# Patient Record
Sex: Female | Born: 1955 | Race: White | Hispanic: No | Marital: Married | Smoking: Former smoker
Health system: Southern US, Community
[De-identification: ages and names within clinical notes are randomized; demographics above are authoritative.]

## PROBLEM LIST (undated history)

## (undated) DIAGNOSIS — G4730 Sleep apnea, unspecified: Secondary | ICD-10-CM

## (undated) DIAGNOSIS — E669 Obesity, unspecified: Secondary | ICD-10-CM

## (undated) DIAGNOSIS — R112 Nausea with vomiting, unspecified: Secondary | ICD-10-CM

## (undated) DIAGNOSIS — G43909 Migraine, unspecified, not intractable, without status migrainosus: Secondary | ICD-10-CM

## (undated) DIAGNOSIS — M79606 Pain in leg, unspecified: Secondary | ICD-10-CM

## (undated) DIAGNOSIS — E119 Type 2 diabetes mellitus without complications: Secondary | ICD-10-CM

## (undated) DIAGNOSIS — I509 Heart failure, unspecified: Secondary | ICD-10-CM

## (undated) DIAGNOSIS — Z9889 Other specified postprocedural states: Secondary | ICD-10-CM

## (undated) DIAGNOSIS — E785 Hyperlipidemia, unspecified: Secondary | ICD-10-CM

## (undated) DIAGNOSIS — Z95810 Presence of automatic (implantable) cardiac defibrillator: Secondary | ICD-10-CM

## (undated) DIAGNOSIS — I10 Essential (primary) hypertension: Secondary | ICD-10-CM

## (undated) HISTORY — PX: NECK SURGERY: SHX720

## (undated) HISTORY — DX: Heart failure, unspecified: I50.9

## (undated) HISTORY — DX: Sleep apnea, unspecified: G47.30

## (undated) HISTORY — PX: PACEMAKER INSERTION: SHX728

## (undated) HISTORY — DX: Migraine, unspecified, not intractable, without status migrainosus: G43.909

## (undated) HISTORY — DX: Essential (primary) hypertension: I10

## (undated) HISTORY — DX: Presence of automatic (implantable) cardiac defibrillator: Z95.810

## (undated) HISTORY — DX: Type 2 diabetes mellitus without complications: E11.9

## (undated) HISTORY — DX: Pain in leg, unspecified: M79.606

## (undated) HISTORY — PX: ABDOMINAL HYSTERECTOMY: SHX81

## (undated) HISTORY — PX: CARDIAC DEFIBRILLATOR PLACEMENT: SHX171

## (undated) HISTORY — PX: KNEE SURGERY: SHX244

## (undated) HISTORY — DX: Hyperlipidemia, unspecified: E78.5

## (undated) HISTORY — DX: Obesity, unspecified: E66.9

---

## 1999-07-28 ENCOUNTER — Encounter (INDEPENDENT_AMBULATORY_CARE_PROVIDER_SITE_OTHER): Payer: Self-pay | Admitting: Specialist

## 1999-07-28 ENCOUNTER — Inpatient Hospital Stay (HOSPITAL_COMMUNITY): Admission: RE | Admit: 1999-07-28 | Discharge: 1999-07-31 | Payer: Self-pay | Admitting: Obstetrics and Gynecology

## 2000-01-04 ENCOUNTER — Encounter (INDEPENDENT_AMBULATORY_CARE_PROVIDER_SITE_OTHER): Payer: Self-pay | Admitting: *Deleted

## 2000-01-04 ENCOUNTER — Ambulatory Visit (HOSPITAL_COMMUNITY): Admission: RE | Admit: 2000-01-04 | Discharge: 2000-01-04 | Payer: Self-pay | Admitting: Gastroenterology

## 2000-04-28 ENCOUNTER — Encounter: Admission: RE | Admit: 2000-04-28 | Discharge: 2000-04-28 | Payer: Self-pay | Admitting: Obstetrics and Gynecology

## 2000-04-28 ENCOUNTER — Encounter: Payer: Self-pay | Admitting: Obstetrics and Gynecology

## 2000-05-09 ENCOUNTER — Ambulatory Visit (HOSPITAL_BASED_OUTPATIENT_CLINIC_OR_DEPARTMENT_OTHER): Admission: RE | Admit: 2000-05-09 | Discharge: 2000-05-09 | Payer: Self-pay | Admitting: Pulmonary Disease

## 2001-01-16 ENCOUNTER — Other Ambulatory Visit: Admission: RE | Admit: 2001-01-16 | Discharge: 2001-01-16 | Payer: Self-pay | Admitting: Obstetrics and Gynecology

## 2003-10-02 ENCOUNTER — Other Ambulatory Visit: Admission: RE | Admit: 2003-10-02 | Discharge: 2003-10-02 | Payer: Self-pay | Admitting: Obstetrics and Gynecology

## 2004-10-02 ENCOUNTER — Ambulatory Visit (HOSPITAL_COMMUNITY): Admission: RE | Admit: 2004-10-02 | Discharge: 2004-10-02 | Payer: Self-pay | Admitting: Cardiology

## 2005-01-18 ENCOUNTER — Ambulatory Visit (HOSPITAL_COMMUNITY): Admission: RE | Admit: 2005-01-18 | Discharge: 2005-01-19 | Payer: Self-pay | Admitting: Neurosurgery

## 2006-02-02 ENCOUNTER — Encounter: Admission: RE | Admit: 2006-02-02 | Discharge: 2006-02-02 | Payer: Self-pay | Admitting: Cardiology

## 2006-07-15 ENCOUNTER — Ambulatory Visit (HOSPITAL_COMMUNITY): Admission: RE | Admit: 2006-07-15 | Discharge: 2006-07-15 | Payer: Self-pay | Admitting: Cardiology

## 2006-07-15 HISTORY — PX: CARDIAC CATHETERIZATION: SHX172

## 2007-06-07 ENCOUNTER — Emergency Department (HOSPITAL_COMMUNITY): Admission: EM | Admit: 2007-06-07 | Discharge: 2007-06-07 | Payer: Self-pay | Admitting: Emergency Medicine

## 2007-11-10 ENCOUNTER — Ambulatory Visit: Payer: Self-pay | Admitting: Internal Medicine

## 2007-12-13 ENCOUNTER — Ambulatory Visit: Payer: Self-pay | Admitting: Internal Medicine

## 2007-12-13 LAB — CONVERTED CEMR LAB
Basophils Absolute: 0.1 10*3/uL (ref 0.0–0.1)
Basophils Relative: 1.9 % (ref 0.0–3.0)
Creatinine, Ser: 1 mg/dL (ref 0.4–1.2)
Eosinophils Absolute: 0.2 10*3/uL (ref 0.0–0.7)
Eosinophils Relative: 2.8 % (ref 0.0–5.0)
HCT: 35.5 % — ABNORMAL LOW (ref 36.0–46.0)
INR: 1.1 — ABNORMAL HIGH (ref 0.8–1.0)
Lymphocytes Relative: 36.9 % (ref 12.0–46.0)
MCHC: 34.6 g/dL (ref 30.0–36.0)
Monocytes Relative: 6 % (ref 3.0–12.0)
Potassium: 3.4 meq/L — ABNORMAL LOW (ref 3.5–5.1)
Prothrombin Time: 13.2 s (ref 10.9–13.3)
RBC: 4.07 M/uL (ref 3.87–5.11)
RDW: 13.4 % (ref 11.5–14.6)
Sodium: 141 meq/L (ref 135–145)
WBC: 6.9 10*3/uL (ref 4.5–10.5)

## 2007-12-22 ENCOUNTER — Ambulatory Visit: Payer: Self-pay | Admitting: Internal Medicine

## 2007-12-22 ENCOUNTER — Inpatient Hospital Stay (HOSPITAL_COMMUNITY): Admission: RE | Admit: 2007-12-22 | Discharge: 2007-12-23 | Payer: Self-pay | Admitting: Internal Medicine

## 2008-01-10 ENCOUNTER — Ambulatory Visit: Payer: Self-pay

## 2008-03-19 ENCOUNTER — Ambulatory Visit: Payer: Self-pay | Admitting: Internal Medicine

## 2008-05-01 ENCOUNTER — Encounter: Payer: Self-pay | Admitting: Internal Medicine

## 2008-05-02 ENCOUNTER — Encounter: Admission: RE | Admit: 2008-05-02 | Discharge: 2008-05-02 | Payer: Self-pay | Admitting: Obstetrics and Gynecology

## 2008-06-17 ENCOUNTER — Ambulatory Visit: Payer: Self-pay | Admitting: Internal Medicine

## 2008-07-26 ENCOUNTER — Encounter: Payer: Self-pay | Admitting: Pulmonary Disease

## 2008-09-16 ENCOUNTER — Ambulatory Visit: Payer: Self-pay | Admitting: Internal Medicine

## 2008-09-19 ENCOUNTER — Encounter: Payer: Self-pay | Admitting: Internal Medicine

## 2008-10-20 ENCOUNTER — Emergency Department (HOSPITAL_COMMUNITY): Admission: EM | Admit: 2008-10-20 | Discharge: 2008-10-20 | Payer: Self-pay | Admitting: Emergency Medicine

## 2008-12-19 ENCOUNTER — Encounter: Admission: RE | Admit: 2008-12-19 | Discharge: 2008-12-19 | Payer: Self-pay | Admitting: Gastroenterology

## 2008-12-31 ENCOUNTER — Ambulatory Visit (HOSPITAL_COMMUNITY): Admission: RE | Admit: 2008-12-31 | Discharge: 2008-12-31 | Payer: Self-pay | Admitting: Gastroenterology

## 2009-01-21 ENCOUNTER — Encounter: Admission: RE | Admit: 2009-01-21 | Discharge: 2009-01-21 | Payer: Self-pay | Admitting: Gastroenterology

## 2009-03-17 DIAGNOSIS — I10 Essential (primary) hypertension: Secondary | ICD-10-CM | POA: Insufficient documentation

## 2009-03-17 DIAGNOSIS — E7800 Pure hypercholesterolemia, unspecified: Secondary | ICD-10-CM | POA: Insufficient documentation

## 2009-04-14 ENCOUNTER — Ambulatory Visit: Payer: Self-pay | Admitting: Internal Medicine

## 2009-05-04 ENCOUNTER — Emergency Department (HOSPITAL_COMMUNITY): Admission: EM | Admit: 2009-05-04 | Discharge: 2009-05-04 | Payer: Self-pay | Admitting: Family Medicine

## 2009-07-14 ENCOUNTER — Ambulatory Visit: Payer: Self-pay | Admitting: Internal Medicine

## 2009-07-18 ENCOUNTER — Encounter: Payer: Self-pay | Admitting: Internal Medicine

## 2009-08-11 ENCOUNTER — Encounter: Admission: RE | Admit: 2009-08-11 | Discharge: 2009-08-11 | Payer: Self-pay | Admitting: Gastroenterology

## 2009-08-28 ENCOUNTER — Ambulatory Visit (HOSPITAL_COMMUNITY): Admission: RE | Admit: 2009-08-28 | Discharge: 2009-08-28 | Payer: Self-pay | Admitting: Internal Medicine

## 2009-10-15 ENCOUNTER — Encounter: Payer: Self-pay | Admitting: Internal Medicine

## 2009-10-16 ENCOUNTER — Ambulatory Visit: Payer: Self-pay | Admitting: Internal Medicine

## 2009-11-14 ENCOUNTER — Encounter: Payer: Self-pay | Admitting: Internal Medicine

## 2010-01-22 ENCOUNTER — Ambulatory Visit: Payer: Self-pay | Admitting: Internal Medicine

## 2010-02-19 ENCOUNTER — Encounter (INDEPENDENT_AMBULATORY_CARE_PROVIDER_SITE_OTHER): Payer: Self-pay | Admitting: *Deleted

## 2010-04-07 NOTE — Cardiovascular Report (Signed)
Summary: Office Visit Remote   Office Visit Remote   Imported By: Roderic Ovens 07/25/2009 15:26:38  _____________________________________________________________________  External Attachment:    Type:   Image     Comment:   External Document

## 2010-04-07 NOTE — Assessment & Plan Note (Signed)
Summary: PACER CHECK/SL   Visit Type:  Pacemaker check Primary Provider:  Marisue Brooklyn  CC:  sob w/exertion...denies any cp or edema.  History of Present Illness: [name] returns today for followup She is a pleasant 55 yo woman with a DCM, CHF, class 2 and is s/p BiV ICD.  She has had no intercurrent ICD therapies.  She admits to some dietary indiscretion with sodium.  No C/P.  The patient has not been exercising.  No other complaints.  Current Medications (verified): 1)  Estrtest 2.5mg  .... 1 Tab At Bedtime 2)  Tofranil 25 Mg Tabs (Imipramine Hcl) .... 2 Tab At Bedtime 3)  Crestor 10 Mg Tabs (Rosuvastatin Calcium) .Marland Kitchen.. 1 Tab At Bedtime 4)  Valturna 300-320 Mg Tabs (Aliskiren-Valsartan) .Marland Kitchen.. 1 Tab Once Daily 5)  Verapamil Hcl Cr 240 Mg Cr-Tabs (Verapamil Hcl) .Marland Kitchen.. 1 Tab Once Daily 6)  Metformin Hcl 1000 Mg Tabs (Metformin Hcl) .Marland Kitchen.. 1 Tab Two Times A Day 7)  Trilipix 135 Mg Cpdr (Choline Fenofibrate) .Marland Kitchen.. 1 Tab Once Daily 8)  Coreg Cr 20 Mg Xr24h-Cap (Carvedilol Phosphate) .Marland Kitchen.. 1 Tab At Bedtime 9)  Potassium Chloride Crys Cr 20 Meq Cr-Tabs (Potassium Chloride Crys Cr) .Marland Kitchen.. 1 Tab At Bedtime 10)  Victoza 18 Mg/76ml Soln (Liraglutide) .... 1.2 Mg/injectable 1 Time Daily 11)  Torsemide 20 Mg Tabs (Torsemide) .Marland Kitchen.. 1 Tab Every Other Day 12)  Protonix 40 Mg Tbec (Pantoprazole Sodium) .... As Needed 13)  Aspirin Ec 325 Mg Tbec (Aspirin) .... Take One Tablet By Mouth Daily 14)  Fish Oil 1200 Mg Caps (Omega-3 Fatty Acids) .Marland Kitchen.. 1 Cap Once Daily 15)  Vitamin E 400 Unit Caps (Vitamin E) .... 3 Caps Once Daily 16)  Black Cohosh 540 Mg Caps (Black Cohosh) .Marland Kitchen.. 1 Tab Once Daily 17)  Vitamin D 2000 Unit Tabs (Cholecalciferol) .Marland Kitchen.. 1 Tab Once Daily  Allergies (verified): No Known Drug Allergies  Past History:  Past Medical History: Last updated: 03/17/2009 Current Problems:  IMPLANTATION OF DEFIBRILLATOR,  ST. JUDE  MODEL 7122 (ICD-V45.02) HYPERCHOLESTEROLEMIA (ICD-272.0) CHF  (ICD-428.0) HYPERTENSION (ICD-401.9) DM (ICD-250.00) SLEEP APNEA (ICD-780.57)    Review of Systems  The patient denies chest pain, syncope, dyspnea on exertion, and peripheral edema.    Vital Signs:  Patient profile:   55 year old female Height:      67 inches Weight:      222 pounds BMI:     34.90 Pulse rate:   93 / minute Pulse rhythm:   regular BP sitting:   153 / 94  (left arm) Cuff size:   large  Vitals Entered By: Danielle Rankin, CMA (April 14, 2009 3:29 PM)  Physical Exam  General:  Obese, well developed, well nourished, in no acute distress.  HEENT: normal Neck: supple. No JVD. Carotids 2+ bilaterally no bruits Cor: RRR no rubs, gallops or murmur Lungs: CTA. Well healed ICD incision. Ab: soft, nontender. nondistended. No HSM. Good bowel sounds Ext: warm. no cyanosis, clubbing or edema Neuro: alert and oriented. Grossly nonfocal. affect pleasant     ICD Specifications Following MD:  Lewayne Bunting, MD     ICD Vendor:  St Jude     ICD Model Number:  [phone]     ICD Serial Number:  [id number] ICD DOI:  12/22/2007     ICD Implanting MD:  Lewayne Bunting, MD  Lead 1:    Location: RA     DOI: 12/22/2007     Model #: [id number]     Serial #:  [id number]     Status: active Lead 2:    Location: RV     DOI: 12/22/2007     Model #: 7122     Serial #: [id number]     Status: active Lead 3:    Location: LV     DOI: 12/22/2007     Model #: 1478     Serial #: [id number]     Status: active  Indications::  NICM, CHF   ICD Follow Up Remote Check?  No Battery Voltage:  3.17 V     Charge Time:  11.1 seconds     ICD Dependent:  No       ICD Device Measurements Atrium:  Amplitude: 3.7 mV, Impedance: 440 ohms, Threshold: 0.5 V at 0.4 msec Right Ventricle:  Amplitude: 11.7 mV, Impedance: 480 ohms, Threshold: 0.75 V at 0.4 msec Left Ventricle:  Impedance: 340 ohms, Threshold: 0.5 V at 0.4 msec  Episodes MS Episodes:  0     Percent Mode Switch:  0     Coumadin:  No Shock:  0     ATP:  0      Nonsustained:  0     Atrial Pacing:  <1%     Ventricular Pacing:  100%  Brady Parameters Mode DDD     Lower Rate Limit:  60     Upper Rate Limit 130 PAV 180     Sensed AV Delay:  130  Tachy Zones VF:  240     VT:  200     VT1:  181     Next Remote Date:  07/14/2009     Next Cardiology Appt Due:  04/08/2010 Tech Comments:  No parameter changes.  Device function normal.  Resting heart rate 91 today.  Merlin transmissions every 3 months. ROV 1 year Dr. Ladona Ridgel. Altha Harm, LPN  April 14, 2009 4:18 PM  MD Comments:  Agree with above.  Impression & Recommendations:  Problem # 1:  IMPLANTATION OF DEFIBRILLATOR,  ST. JUDE  MODEL 7122 (ICD-V45.02) Her device is working normally.  Will recheck in several months.  Problem # 2:  CHF (ICD-428.0) Her symptoms remain class 2.  I have encouraged her to reduce her sodium intake and to start exercising along with continuing her medical therapy. Her updated medication list for this problem includes:    Verapamil Hcl Cr 240 Mg Cr-tabs (Verapamil hcl) .Marland Kitchen... 1 tab once daily    Coreg Cr 20 Mg Xr24h-cap (Carvedilol phosphate) .Marland Kitchen... 1 tab at bedtime    Torsemide 20 Mg Tabs (Torsemide) .Marland Kitchen... 1 tab every other day    Aspirin Ec 325 Mg Tbec (Aspirin) .Marland Kitchen... Take one tablet by mouth daily  Problem # 3:  HYPERTENSION (ICD-401.9) Again, a low sodium diet is recommended. Ideally we will be able to stop her verapamil.  Her updated medication list for this problem includes:    Valturna 300-320 Mg Tabs (Aliskiren-valsartan) .Marland Kitchen... 1 tab once daily    Verapamil Hcl Cr 240 Mg Cr-tabs (Verapamil hcl) .Marland Kitchen... 1 tab once daily    Coreg Cr 20 Mg Xr24h-cap (Carvedilol phosphate) .Marland Kitchen... 1 tab at bedtime    Torsemide 20 Mg Tabs (Torsemide) .Marland Kitchen... 1 tab every other day    Aspirin Ec 325 Mg Tbec (Aspirin) .Marland Kitchen... Take one tablet by mouth daily  Patient Instructions: 1)  Your physician recommends that you schedule a follow-up appointment in: 12 months with Dr Ladona Ridgel

## 2010-04-07 NOTE — Cardiovascular Report (Signed)
Summary: Office Visit Remote   Office Visit Remote   Imported By: Roderic Ovens 11/18/2009 10:04:39  _____________________________________________________________________  External Attachment:    Type:   Image     Comment:   External Document

## 2010-04-07 NOTE — Letter (Signed)
Summary: Remote Device Check  [facility], Main Office  [address]   [place], [place] [address]   Phone: [phone]  Fax: [phone]     Jul 18, 2009 MRN: [id number]   [name] [address] [place], [place]  [address]   Dear Ms. Levins,   Your remote transmission was recieved and reviewed by your physician.  All diagnostics were within normal limits for you.  __X___Your next transmission is scheduled for:  10-16-2009.  Please transmit at any time this day.  If you have a wireless device your transmission will be sent automatically.   Sincerely,  Vella Kohler

## 2010-04-07 NOTE — Cardiovascular Report (Signed)
Summary: Office Visit   Office Visit   Imported By: Roderic Ovens 04/17/2009 14:53:46  _____________________________________________________________________  External Attachment:    Type:   Image     Comment:   External Document

## 2010-04-07 NOTE — Letter (Signed)
Summary: Remote Device Check  [facility], Main Office  [address]   [place], [place] [address]   Phone: [phone]  Fax: [phone]     November 14, 2009 MRN: [id number]   [name] [address] [place], [place]  [address]   Dear Ms. Dibello,   Your remote transmission was recieved and reviewed by your physician.  All diagnostics were within normal limits for you.  __X___Your next transmission is scheduled for:   01-22-2010.  Please transmit at any time this day.  If you have a wireless device your transmission will be sent automatically.   Sincerely,  Vella Kohler

## 2010-04-09 NOTE — Cardiovascular Report (Signed)
Summary: Office Visit Remote   Office Visit Remote   Imported By: Roderic Ovens 02/20/2010 09:21:44  _____________________________________________________________________  External Attachment:    Type:   Image     Comment:   External Document

## 2010-04-09 NOTE — Letter (Signed)
Summary: Remote Device Check  [facility], Main Office  [address]   [place], [place] [address]   Phone: [phone]  Fax: [phone]     February 19, 2010 MRN: [id number]   SANAIYA WELLIVER [address] [place], [place]  [address]   Dear Ms. Schul,   Your remote transmission was recieved and reviewed by your physician.  All diagnostics were within normal limits for you.  _____Your next transmission is scheduled for:                       .  Please transmit at any time this day.  If you have a wireless device your transmission will be sent automatically.  ___X___Your next office visit is scheduled for:February 2012 with Dr. Ladona Ridgel.  Please call our office to schedule an appointment.    Sincerely,  Altha Harm, LPN

## 2010-04-22 ENCOUNTER — Encounter: Payer: Self-pay | Admitting: Internal Medicine

## 2010-04-22 DIAGNOSIS — Z95810 Presence of automatic (implantable) cardiac defibrillator: Secondary | ICD-10-CM | POA: Insufficient documentation

## 2010-05-12 ENCOUNTER — Encounter: Payer: Self-pay | Admitting: Internal Medicine

## 2010-05-26 ENCOUNTER — Encounter: Payer: Self-pay | Admitting: Internal Medicine

## 2010-07-21 NOTE — Letter (Signed)
November 10, 2007    Cristy Hilts. Jacinto Halim, MD  [address]  [place], [place] [address]   RE:  [name], [name]  MRN:  [id number]  /  DOB:  September 10, 1955   Dear Vonna Kotyk,   Thank you for referring Ms. Fredric Dine for EP evaluation,  consideration for ICD implantation.  As you know, she is a very pleasant  middle-aged woman with a history of nonischemic cardiomyopathy and  congestive heart failure and left bundle-branch block.  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  She has had worsening heart  failure despite maximum medical therapy and is referred now for  additional evaluation.  The patient denies recent chest pain, though she  does have some chest pressure.  Her past medical history is notable for  obstructive sleep apnea.  She wears CPAP.  She has hypertension.  She  has diabetes.  She has dyslipidemia.  Her family history is notable for  her father having myocardial infarctions and multiple stents and is now  deceased.  Her mother is still living.  She has 1 sister who is alive  and 2 brothers.   Her social history, the patient works as a Statistician.  She  she is married.  She has 2 children.  She has a history of tobacco use  but stopped smoking over 30 years ago.  She denies alcohol abuse  drinking less than 1 alcoholic beverage per week.  She typically drinks  one caffeinated beverage per day.   Her review of systems is notable for some gastroesophageal reflux  symptoms, occasional constipation, and polyuria from her diabetes.  Otherwise all systems were reviewed and negative except for the  occasional headaches.   On physical exam, she is a pleasant, well-appearing, middle-aged woman  in no acute distress.  The blood pressure was 122/70, the pulse 87 and  regular, respirations were 18, the weight was 222  pounds.  HEENT exam is  normocephalic and atraumatic.  Pupils equal and round.  The oropharynx  was moist.  Sclerae were anicteric.  Neck revealed no jugular  distention.  There is no thyromegaly.  The trachea is midline.  Carotid  are 2+ and symmetric.  The lungs are clear bilaterally on auscultation.  No wheezes, rales, or rhonchi are present.  There is no increased work  of breathing.  Cardiovascular exam revealed a regular rate and rhythm.  Normal S1 and S2.  The PMI was not enlarged and was laterally displaced.  The abdominal exam was soft, nontender, and nondistended.  There is no  organomegaly.  The bowel sounds are present.  No rebound or guarding.  Extremities demonstrate no cyanosis, clubbing, or edema.  The pulses is  2+ and symmetric.  Neurologic exam, alert and oriented x3.  His cranial  nerves are intact.  Strength was 5/5 and symmetric.   EKG demonstrates sinus rhythm with left bundle-branch block.   IMPRESSION:  1. Nonischemic cardiomyopathy, ejection fraction 30%.  2. Left bundle-branch block.  3. Class III heart failure.  4. Hypertension.   DISCUSSION:  I have discussed treatment options with Ms. Abt in  detail.  The risks, benefits, goals, and expectations of biventricular  defibrillator implantation have been discussed with the patient.  She  would like to proceed this and will be scheduled at the earliest  possible convenient time.   Vonna Kotyk, once again thank you for referring Ms. Bazar for EP evaluation and  consideration for ICD implantation.  I will keep you apprised as to how  she does.    Sincerely,      Doylene Canning. Ladona Ridgel, MD  Electronically Signed    GWT/MedQ  DD: 11/10/2007  DT: 11/11/2007  Job #: [id number]   CC:    Lovenia Kim, D.O.

## 2010-07-21 NOTE — Discharge Summary (Signed)
[name]               ACCOUNT NO.:  [id number]   MEDICAL RECORD NO.:  [id number]          PATIENT TYPE:  INP   LOCATION:  3733                         FACILITY:  [facility]   PHYSICIAN:  Kara Pick. Eden Emms, MD, FACCDATE OF BIRTH:  1955-07-25   DATE OF ADMISSION:  12/22/2007  DATE OF DISCHARGE:  12/23/2007                               DISCHARGE SUMMARY   PRIMARY CARDIOLOGIST:  Kara Canning. Ladona Ridgel, MD   PROCEDURES PERFORMED DURING HOSPITALIZATION:  Status post AICD pacemaker  implantation.  a.  This is a Designer, jewellery biventricular ICD, serial number [id number].   FINAL DISCHARGE DIAGNOSES:  1. Nonischemic cardiomyopathy.  2. Status post St. Jude biventricular implantable cardioverter-      defibrillator pacemaker on December 22, 2007.  3. Normal cardiac catheterization in May 2008.  4. Chronic left bundle-branch block.  5. Hypertension.  6. Non-insulin-dependent diabetes.  7. Sleep apnea.  8. Dyslipidemia.  9. History of pulmonary hypertension by echo in the past.   HOSPITAL COURSE:  This is a 55 year old female patient who is followed by  Dr. Jacinto Trujillo with a history of nonischemic cardiomyopathy with an EF of 25-  35% per echo in December 2008 with gradual increase of mild orthopnea,  cough, and vague chest fullness.  The patient was admitted to [facility] for biv-ICD pacemaker after discussion with Dr. Lewayne Trujillo by  Dr. Jacinto Trujillo.  The patient tolerated the procedure well without bleeding  complications or severe pain.  During hospitalization, there was a  lengthy discussion with Dr. Ladona Trujillo and Mr. [name], physician assistant  for EP concerning pacemaker functioning, post-procedure cautions, and  post-procedure followup.  The patient verbalized understanding.  The  patient will follow up with Dr. Lewayne Trujillo in 3 months and with the  Pacemaker ICD Clinic in 2 weeks.  The patient has been given post-  pacemaker implantation activity instructions as well.   DISCHARGE  LABORATORY DATA:  Sodium 139, potassium 3.6, chloride 100, CO2  of 31, glucose 142, BUN 15, creatinine 1.12.  Chest x-ray dated December 23, 2007, interval placement of 3 leads as a part of pacemaker  defibrillator, cardiomegaly without failure.   DISCHARGE MEDICATIONS:  1. Glucophage SR 1000 mg twice daily, but to hold until Monday,      December 25, 2007.  2. Estratest 2.5 mg daily.  3. Imipramine 25/50-100 daily.  4. Crestor 10 mg daily at bedtime.  5. Diovan 320 mg daily.  6. Tekturna and hydrochlorothiazide 325 mg daily.  7. Verapamil 240 mg daily.  8. Januvia 100 mg daily.  9. TriLipix 135 mg daily.  10.Coreg CR 80 mg daily.  11.Potassium chloride 20 mEq daily.  12.Furosemide 40 mg p.r.n.  13.Protonix 40 mg daily.  14.Ibuprofen 100 mg as needed.  15.Enteric-coated aspirin 325 daily.  16.Fish oil 1200 mg daily.  17.Vitamin E 400, 800, and 1200 units daily.  18.Black cohosh 540 mg daily.  19.Vitamin D 2000 IU daily.   ALLERGIES:  No known drug allergies.   FOLLOWUP PLANS AND APPOINTMENT:  1. The patient will follow up with Dr. Jacinto Trujillo an  outpatient for      continued cardiac management.  2. The patient will follow with Dr. Lewayne Trujillo in 3 months for post-      pacemaker evaluation.  3. The patient will follow with the Pacemaker Clinic in 2 weeks for      post-pacemaker implantation evaluation.  4. The patient has been given post-pacemaker implantation      instructions.  5. The patient has been advised not to take Glucophage until Monday,      December 25, 2007.   Time spent with the patient to include physician time, 30 minutes.      [name], NP      Kara Pick. Eden Emms, MD, [facility]  Electronically Signed    KML/MEDQ  D:  12/23/2007  T:  12/23/2007  Job:  [id number]   cc:   Cristy Hilts. Kara Halim, MD

## 2010-07-21 NOTE — Cardiovascular Report (Signed)
NAMEALYONA, ROMACK               ACCOUNT NO.:  [id number]   MEDICAL RECORD NO.:  [id number]          PATIENT TYPE:  OIB   LOCATION:  2853                         FACILITY:  [facility]   PHYSICIAN:  Cristy Hilts. Jacinto Halim, MD       DATE OF BIRTH:  [birth year]   DATE OF PROCEDURE:  DATE OF DISCHARGE:                            CARDIAC CATHETERIZATION   PROCEDURES PERFORMED:  1. Right and left heart catheterization.  2. Left ventriculography.  3. Selective right and left coronary arteriography.  4. Intravascular ultrasound interrogation of the left anterior      descending artery.   INDICATIONS:  Ms. Sharis Keeran is a 55 year old female with a history  of hypertension, diabetes and morbid obesity, who had been complaining  of increasing shortness of breath.  She recently underwent  echocardiographic and stress Myoview, and this had revealed a dilated  left ventricle with moderate to severe LV systolic dysfunction with an  ejection fraction of around 30-35%.  This decrease in the LV systolic  function was new compared to 2006 evaluation, where her ejection  fraction was around 45-50%.  Given her multiple cardiovascular risk  factors, she was brought to the cardiac catheterization lab to evaluate  her coronary anatomy.  Right heart catheterization was performed to  evaluate for pulmonary hypertension.     The intravascular ultrasound interrogation of the LAD was performed  because of the suspicion for high-grade stenosis in the LAD given the  fact that stress Myoview had revealed probable anterior wall ischemia.   HEMODYNAMIC DATA, RIGHT HEART CATHETERIZATION:  The RA pressures were  15/13 with a mean of 12 mmHg.   RV pressures were 41/10 with an end-diastolic pressure of 15 mmHg.   PA pressures were 46/31 with a mean of 37 mmHg.   Pulmonary capillary wedge pressure was 28/27 with a mean of 24 mmHg.   The PA saturation was 70%, RA saturation was 74%, and aortic saturation  was 96%.  The  cardiac output was 4.2 with a cardiac index of 2.0 by  Fick.   HEMODYNAMIC DATA LEFT HEART CATHETERIZATION:  Left ventricular pressures  were 151/16 with end-diastolic pressure of 17 mmHg.  The aortic pressure  of 149/95 with a mean of 117 mmHg.  There was no pressure gradient  across the aortic valve.   ANGIOGRAPHIC DATA:  Left ventricle:  Left ventricular systolic function  was moderate to severely depressed with ejection fraction of 35-40% with  global hypokinesis.  The left ventricle is mild to moderately dilated.  There was no significant mitral regurgitation.   Right coronary artery:  The right coronary artery is a large-caliber  vessel and a dominant vessel.  It has very mild luminal irregularity.   Next left:  Main left main is large-caliber vessel.  Smooth and normal.   Circumflex:  Circumflex is a large-caliber vessel.  It has mild  tortuosity.  It has mild luminal irregularity.   LAD:  LAD is a large-caliber vessel.  There is a 30-40% stenosis in the  mid LAD.  Otherwise the LAD has mild has mild luminal irregularity.  INTRAVASCULAR ULTRASOUND INTERROGATION OF THE LEFT ANTERIOR DESCENDING  ARTERY:  The IVUS interrogation of the LAD revealed mild plaque burden  in the LAD.  The LAD is a large vessel measuring around 3.5-4 mm in  lumen.  There was eccentric plaque, which was also soft plaque, in the  LAD.  The left main was widely patent with mild plaque burden.   IMPRESSION:  1. No significant coronary artery disease by cardiac catheterization      and is confirmed by intravascular ultrasound of the left anterior      descending artery.  2. Nonischemic cardiomyopathy probably related to her uncontrolled      hypertension, diabetes, and morbid obesity.  3. Moderate pulmonary hypertension.  4. Preserved cardiac output and cardiac index.   RECOMMENDATIONS:  Continued risk modification is indicated with  aggressive control of her weight gain, control of her diabetes  and  hypertension.   A total of 140 mL of contrast was utilized for diagnostic angiography.  Right femoral arterial access was closed with Star Close with excellent  hemostasis.   TECHNIQUE OF THE PROCEDURE:  Under the usual sterile precautions using a  7-French right femoral venous and a 6-French right femoral arterial  access, right and left heart catheterization was performed.   Right heart catheterization was performed using a balloon-tip Swan-Ganz  catheter, which was easily advanced into the pulmonary capillary wedge  position.  Hemodynamics were obtained and the data was carefully  analyzed.  PA saturations were also obtained.   TECHNIQUE OF LEFT HEART CATHETERIZATION:  A 6-French multipurpose B2  catheter was advanced into the ascending aorta and into the left  ventricle over a J-wire.  Left ventriculography was performed both in  the LAO and RAO projection.  Then the catheter was pulled into the  ascending aorta and the right coronary artery was selectively engaged  and angiography was performed and the left main coronary artery was  selected and angiography was performed.  Then the catheter was pulled  out of the body in the usual fashion.   TECHNIQUE OF INTRAVASCULAR ULTRASOUND INTERROGATION:  Using A total of  6500 units of heparin and maintaining ACT greater than 200, an [employer] wire was advanced into the LAD after engaging the left main  coronary artery with a 7-French Judkins left 3.5 guide.  Then using  Galaxy IVUS catheter, the IVUS catheter was advanced into the LAD and  careful pullback was performed.  The data was carefully analyzed.  Then  the guidewire and IVUS catheter were was pulled out of body.  Angiography was repeated, guide catheter  disengaged and pulled out of the body in the usual fashion.  Catheter  exchanges were done using a J-wire.  Right femoral angiography was performed through the arterial access sheath and the access closed with   Star Close with excellent hemostasis.  The patient of the procedure  well.  No immediate complications were noted.      Cristy Hilts. Jacinto Halim, MD  Electronically Signed     JRG/MEDQ  D:  07/15/2006  T:  07/15/2006  Job:  [id number]   cc:   Lovenia Kim, D.O.

## 2010-07-21 NOTE — Assessment & Plan Note (Signed)
[facility]                         ELECTROPHYSIOLOGY OFFICE NOTE   Kara, Trujillo                      MRN:          [id number]  DATE:03/19/2008                            DOB:          1955/05/06    [name] returns today for followup.  She is a very pleasant middle-  aged woman with dilated cardiomyopathy and congestive heart failure.  She has known left bundle-branch block.  Since her defibrillator was  placed, she has had improvement in her heart failure symptoms and by her  report, her LV function actually improved by 2-D echo, though I do not  have the actual report of this, this is by her report from Dr. Verl Dicker  office.  She returns today for followup.  She does complain that the  cold weather we have recently had has bothered her from feeling tired  respectively, but otherwise she has been stable.   CURRENT MEDICATIONS:  1. Oral estrogen.  2. She is on imipramine 50 mg daily.  3. Crestor 10 a day.  4. Verapamil 240 a day.  5. Glucophage 100 twice daily.  6. Januvia 100 daily.  7. Trilipix 135 daily.  8. Coreg CR 80 a day.  9. Potassium 20 daily.  10.Torsemide 20 a day.  11.Aspirin 325 a day.  12.Multiple vitamins.  13.Protonix p.r.n.   PHYSICAL EXAMINATION:  GENERAL:  She is a pleasant, obese, middle-aged  woman in no distress.  VITAL SIGNS:  Blood pressure today was 153/94, the pulse was 100 and  regular, respirations were 18, the weight was not recorded.  NECK:  No jugular venous distention.  LUNGS:  Clear bilaterally to auscultation.  No wheezes, rales, or  rhonchi are present.  No increased work of breathing.  CARDIOVASCULAR:  Regular rate and rhythm.  Normal S1 and S2.  ABDOMEN:  Soft, nontender.  EXTREMITIES:  No edema.   Interrogation of her defibrillator demonstrates a St. Jude promote BiV  device.  The P-waves were 4, the R-waves were 11, the impedance 450 in  the A, 540 in the RV, 390 in the LV.  The threshold  0.5 at 0.5 both in  the RA, RV, and LV.  Battery voltage was greater than 3.2 volts.  She  was 99% V paced.  Today we returned her outputs down to 2 and 2.5 in the  right atrium, the right ventricle, and left ventricle respectively.   IMPRESSION:  1. Nonischemic cardiomyopathy.  2. Congestive heart failure.  3. Bundle-branch block.  4. Status post biventricular implantable cardioverter-defibrillator      insertion.   DISCUSSION:  Overall, [name] is stable and her device is working  normally.  We will see the patient back in the office for followup in 1  year, sooner should she have additional problems.     Doylene Canning. Ladona Ridgel, MD  Electronically Signed    GWT/MedQ  DD: 03/19/2008  DT: 03/20/2008  Job #: [id number]   cc:   Cristy Hilts. Jacinto Halim, MD

## 2010-07-21 NOTE — Op Note (Signed)
NAMEDENEISHA, DADE               ACCOUNT NO.:  [id number]   MEDICAL RECORD NO.:  [id number]          PATIENT TYPE:  INP   LOCATION:  2899                         FACILITY:  [facility]   PHYSICIAN:  Doylene Canning. Ladona Ridgel, MD    DATE OF BIRTH:  April 14, 1955   DATE OF PROCEDURE:  12/22/2007  DATE OF DISCHARGE:                               OPERATIVE REPORT   PROCEDURE:  Implantation of a biventricular defibrillator.   INDICATIONS:  Symptomatic congestive heart failure class III with EF 30-  35% with left bundle-branch block.   INTRODUCTION:  The patient is a 52-year woman with a history of  congestive heart failure and gradually increasing LV dysfunction over  the years.  Her EF has gone to 30-35%, her heart failure has worsened,  she has left bundle-branch block, and she despite maximal medical  therapy is now referred for a BiV ICD insertion.   PROCEDURE IN DETAIL:  After informed consent was obtained, the patient  was taken to the Diagnostic EP Lab in a fasting state.  After usual  preparation and draping, intravenous fentanyl and midazolam was given  for sedation.  Lidocaine 30 mL was infiltrated in the left  infraclavicular region.  A 7-cm incision was carried out over this  region.  Electrocautery was utilized to dissect down to the fascial  plane.  The left subclavian vein was then punctured x3 and the St. Jude  model 7122, 60-cm active fixation defibrillation lead, serial #[id number]  was advanced to the right ventricle.  The St. Jude model 603 013 1360, 46-cm  active fixation pacing lead, serial #[id number] was advanced to the right  atrium.  Mapping was carried out in the right ventricle at the final  site.  The R-waves measured 16 mV, the pacing impedance was 900 ohms,  and threshold was 1 volt at 0.5 milliseconds.  A 10-volt pacing did not  stimulate the diaphragm.  With the ventricular lead in satisfactory  position, attention was then turned to placement of the atrial lead.  It  was placed  in the anterolateral portion of the right atrium where P-  waves measured 4 mV, the impedance 600 ohms, and threshold was 1 volt at  0.5 milliseconds.  The 10-volt pacing again did not stimulate the  diaphragm.  With these satisfactory parameters, attention was then  turned to placement of the LV lead.  The guiding catheter was  subsequently advanced into the right atrium.  The coronary sinus was  cannulated without difficulty.  Venography of the coronary sinus was  carried out.  This demonstrated a very tortuous coronary sinus with a  stenosis and valve at approximately 4 o'clock in the LAO projection.  It  also demonstrated an area of very severe narrowing near a nice lateral  vein at approximately 2-3 o'clock in the LAO projection.  Having  demonstrated this, multiple guidewires were utilized to attempt to place  the LV lead which was fairly difficult.  Finally, the Medtronic guiding  catheter was advanced into the coronary sinus and the subselective  Attain II guiding catheter was advanced into a lateral vein.  It should  be noted that the dilator could not be advanced further up the lateral  vein, however, after multiple attempts (we spent over 2 hours placing  the LV lead) the Medtronic Attain Ability model 4196, 88-cm bipolar  passive fixation LV lead, serial #[id number] was advanced into the  lateral vein.  Of note, this lead could not be advanced over a Mailman  guidewire, but was successfully advanced utilizing distal lead itself  with the guidewire withdrawn back into the body of the lead.  At the  final site, the LV waves measured about 9 mV, the pacing impedance was  1100 ohms, threshold was 1.7 volts at 0.5 milliseconds, and 10-volt  pacing did not stimulate the diaphragm.  At this point, the guiding  catheter was liberated from the LV lead in the usual manner as was the  Attain II subselective catheter.  The leads were secured to  subpectoralis fascia with a figure-of-eight  silk suture and the sewing  sleeves were also secured with silk suture.  At this point,  electrocautery was utilized to make a subcutaneous pocket and Kanamycin  irrigation was utilized to irrigate the pocket.  Electrocautery was  utilized to assure hemostasis.  The St. Jude Promote RF, model C4178722  biventricular ICD, serial [id number] was connected to the right atrial,  right ventricular, and left ventricular leads and placed back in the  subcutaneous pocket.  Generator was secured with silk suture.  At this  point, the pocket was irrigated with kanamycin and defibrillation  threshold testing was carried out.   After the patient was more deeply sedated with fentanyl and Versed, VF  was induced with a T-wave shock and a 15-joule shock was delivered  terminating VF and restoring sinus rhythm.  At this point, no additional  defibrillation threshold testing was carried out and the incision was  closed with 2-0 Vicryl and 3-0 Vicryl.  Benzoin was painted on the skin,  Steri-Strips were applied, and a pressure dressing was placed, and the  patient was returned to her room in satisfactory condition.   COMPLICATIONS:  There were no immediate procedure complications.   RESULTS:  This demonstrates a successful albeit very very difficult  biventricular ICD implantation in a patient with a nonischemic  cardiomyopathy, EF 35%, and left bundle-branch block.      Doylene Canning. Ladona Ridgel, MD  Electronically Signed     GWT/MEDQ  D:  12/22/2007  T:  12/23/2007  Job:  [id number]   cc:   Cristy Hilts. Jacinto Halim, MD  Lovenia Kim, D.O.

## 2010-07-24 NOTE — Discharge Summary (Signed)
[facility]  Patient:    [name], [name]                      MRN: [id number] Adm. Date:  [id number] Disc. Date: [id number] Attending:  Miguel Aschoff                           Discharge Summary  ADMISSION DIAGNOSES:          1. Menorrhagia.                               2. Pelvic pain.                               3. Uterine fibroids.                               4. Urinary stress incontinence.  DISCHARGE DIAGNOSES:          1. Menorrhagia.                               2. Pelvic pain.                               3. Uterine fibroids.                               4. Urinary stress incontinence.  PROCEDURE:                    Total abdominal hysterectomy and bilateral salpingo-oophorectomy, and Marshall-Marchetti procedure.  General anesthesia.  BRIEF HISTORY:                The patient is a 55 year old white female with history of progressive menorrhagia secondary to uterine fibroids treated previously with Lupron therapy in an effort to arrest her bleeding, now because of return f heavy bleeding and pelvic pain, the patient requests a definitive procedure to e carried out.  In addition she has signs and symptoms consistent with urinary stress incontinence and requested that a repair of this be done at the time of her hysterectomy.  She was therefore admitted and preoperative studies were obtained. Her admission hemoglobin was 11.0, white count 9800, platelet count 351, PT and PTT within normal limits.  Chemistry profile was within normal limits.  Urinalysis as essentially negative.  HOSPITAL COURSE:              On Jul 28, 1999, she was taken to the operating room where under general anesthesia a total abdominal hysterectomy, bilateral salpingo-oophorectomy, were carried out without difficulty.  Following this procedure a Marshall-Marchetti repair was carried out to resuspend the urethra.  The patient tolerated the procedure well and had  an essentially uneventful postoperative course except for postoperative nausea.  She tolerated increasing  ambulating and diet well.  She was able to void on her own and the suprapubic catheter was removed with minimal residual urine.  By May 25, she was in satisfactory condition to be discharged home.  DISCHARGE MEDICATIONS:        1. Tylox one every three to  four hours as needed                                  for pain.                               2. She was started on ferrous sulfate 325 mg b.i.d.  DISCHARGE INSTRUCTIONS:       She was instructed to do no heavy lifting, place nothing in the vagina, and to call if there were any problems such as fever, pain, or heavy bleeding.  The patient will be seen back for follow-up in four weeks for postoperative check.  She was sent home and instructed to resume her prior medications that she was taking prior to admission. DD:  08/14/99 TD:  08/17/99 Job: [id number]/[id number]

## 2010-07-24 NOTE — Op Note (Signed)
NAMEREESE, SENK               ACCOUNT NO.:  [id number]   MEDICAL RECORD NO.:  [id number]          PATIENT TYPE:  OIB   LOCATION:  3015                         FACILITY:  [facility]   PHYSICIAN:  Donalee Citrin, M.D.        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   PROCEDURE:  Anterior diskectomies and fusion, C5-6 and C6-7, using a 5 mm  Life-Net patellar wedge at C5-6, a 6 mm at C6-7, and a 42 mm Atlantis Vision  plate with six 13 mm variable angle screws.   SURGEON:  Donalee Citrin, MD.   ASSISTANT:  Kathaleen Maser. Pool, MD.   ANESTHESIA:  General endotracheal.   HISTORY OF PRESENT ILLNESS:  The patient is a very pleasant, 55 year old  female with a longstanding neck and predominantly right greater than left  arm pain radiating down the first two fingers of her right hand with  weakness in the triceps preoperatively.  Preoperative MRI scan showed severe  spondylitic disease and spondylitic compression of the left C6 nerve root as  well as a right C7 nerve root.  The patient has failed all forms of  conservative treatment, and it was recommended an anterior cervical  diskectomy and fusion.  The risks and benefits were discussed with the  patient, who understands and agrees to proceed forward.   DESCRIPTION OF PROCEDURE:  The patient was brought to the OR and was induced  under general anesthesia in supine position with the neck in slight  extension with 5-pounds of halter traction.  The right side of the neck was  prepped and draped in the  usual sterile fashion, and a preoperative x-ray  localized the C6 vertebral body.  A curvilinear incision was made just off  the midline and carried through the sternocleidomastoid and superficial  layer up to the platysma, which was dissected out and divided  longitudinally.  The avascular plane between the sternocleidomastoid and the  strap muscles was developed at that time at the prevertebral fascia.  The  prevertebral fascia was dissected with Kitners.  Intraoperative x-ray  confirmed localization initially at the C4-5 disk space, and attention was  taken inferior to this at C5-6 and C6-7 where annulotomies were made with a  #15 blade scalpel to mark the disk spaces.  Then, the longus coli was  reflected laterally, and a self-retaining retractor was placed.  Then, the  annulotomies were extended, the anterior margin of the annulus was removed  in a piecemeal fashion with pituitary rongeurs, and the anterior osteophytes  coming off of the C5 and C6 vertebral bodies were bitten off with a  2 and 3  mm Kerrison punch.  Then, using a high-speed drill, both interspaces were  drilled down the posterior annulus, the posterior longitudinal ligament, and  the posterior osteophyte complexes.  At this point, the operating microscope  was draped and brought onto the field.  Under microscopic illumination first  at C6-7, the undersurface of the osteophyte coming off of the C6 vertebral  body was underbitten with a 1 and 2 mm Kerrison punch, exposing the  posterior longitudinal ligament, and this was removed in a piecemeal fashion  exposing the thecal sac.  Then, the left-sided C7 neuroforamen was  identified and decompressed, then we marched over to the right.  On the  right side, there was marked spondylitic compression of the right C7 nerve  root.  This was all underbitten and removed in a piecemeal fashion,  decompressing the C7 nerve root.  The C7 nerve root was then subsequently  skeletonized  significantly out the foramen, epidural veins were coagulated,  and the C7 nerve root was noted to be widely patent at this point.  Then,  the undersurface of the end-plates essentially were underbitten and  decompressed in the central canal, and then this was packed with Gelfoam.  Attention was then taken to C5-6 and the procedure was repeated.  The  undersurface of the end-plates at C5 was underbitten, decompressing both the  left and right C6 neuroforamen, and the left C6 nerve root was noted to have  marked spondylitic compression due to significant uncinate hypertrophy and  foraminal stenosis.  This was all removed, and then the C6 nerve root was  skeletonized out its foramen.  Then, the end-plates were scraped, copiously  irrigated, and meticulous hemostasis was maintained.  Appropriate epidural  veins were coagulated, and then a 5 mm Life-Net wedge was inserted at C5-6  and a 6 mm Life-Net wedge at C6-7.  Then, a 42 mm Atlantis Vision plate was  fashioned and inserted.  Six 13 mm variable angle screws were drilled and  tapped in place.  All screws had excellent purchase as the screws were  tightened down.  The wound was then copiously irrigated, and meticulous  hemostasis was maintained.  The platysma was reapproximated with 3-0  interrupted Vicryl, and the skin was closed with a running 4-0 subcuticular.  Benzoin and Steri-Strips were applied.  The patient went to the recovery  room in stable condition.  At the end of the case, needle counts, sponge  counts, and instrument counts were correct.           ______________________________  Donalee Citrin, M.D.     GC/MEDQ  D:  01/18/2005  T:  01/18/2005  Job:  [id number]

## 2010-07-24 NOTE — H&P (Signed)
NAMEVERNIE, [name]               ACCOUNT NO.:  [id number]   MEDICAL RECORD NO.:  [id number]          PATIENT TYPE:  INP   LOCATION:  3733                         FACILITY:  [facility]   PHYSICIAN:  Doylene Canning. Ladona Ridgel, MD    DATE OF BIRTH:  27-May-1955   DATE OF ADMISSION:  12/22/2007  DATE OF DISCHARGE:  12/23/2007                              HISTORY & PHYSICAL   BRIEF HISTORY:  [name] is a 55 year old female.  She has a known  history of nonischemic cardiomyopathy with ejection fraction of 30%.  She has New York Heart Association class II symptoms in the past which  have worsened to III symptoms, a chronic systolic congestive heart  failure despite maximum medical therapy.  She also has left bundle  branch block.   The patient denies recent chest pain, she does have some chest pressure.  She has difficulty at times especially with inclines and would be at  some insufficiency, if she had a walk 1-100 yards that would cause her  to get short of breath.  She also has trouble while going up 1-2 flights  of stairs.   The patient presents for implantation of biventricular ICD.   PAST MEDICAL HISTORY:  Obstructive sleep apnea with use of the  continuous positive airway pressure device.  She has hypertension,  diabetes, lipidemia.   FAMILY HISTORY:  Notable in that her father had early MIs and had stents  placed.   SOCIAL HISTORY:  The patient works in a bank.  She is married and has 2  children.  She stopped smoking 30 years ago.  She does not drink  alcoholic beverages very often, may be less than 1 a week.   REVIEW OF SYSTEMS:  Reflux symptoms, occasional constipation, and  polyuria from her diabetes.   PHYSICAL EXAMINATION:  VITAL SIGNS:  Blood pressure is 122/70,  respirations are 18, heart rate is 87 and regular.  She weighs 222  pounds.  HEENT:  Normocephalic and atraumatic.  Eyes; pupils equal, round, and  reactive.  The oropharynx is moist without erythema or lesions.   Sclerae  are anicteric.  NECK:  No jugular venous distention.  No cervical lymphadenopathy.  No  bruits were auscultated.  LUNGS:  Clear bilaterally on auscultation.  HEART:  Regular rate and rhythm.  Normal S1 and S2.  ABDOMEN:  Soft, nondistended, and nontender.  No organomegaly.  Bowel  sounds were present.  EXTREMITIES:  No evidence of clubbing, cyanosis, or edema.  She has  palpable pedal pulses.  NEUROLOGIC:  Alert and oriented x3.  Cranial nerves were intact.   IMPRESSION:  1. Nonischemic cardiomyopathy, ejection fraction 30%.  2. Left bundle branch block.  3. Worsening heart failure despite maximum medical therapy.  4. Hypertension.  5. Diabetes.   The risks and benefits of implantation of BiV ICD have been discussed  with the patient.  She wishes to proceed at the earliest possible time.      [name], PA      Doylene Canning. Ladona Ridgel, MD  Electronically Signed    GM/MEDQ  D:  01/24/2008  T:  01/25/2008  Job:  [id number]

## 2010-07-24 NOTE — Procedures (Signed)
[place]  Patient:    [name], [name]                      MRN: [id number] Proc. Date: 01/04/00 Adm. Date:  [id number] Attending:  Rich Brave CC:         Miguel Aschoff, M.D.  Richard A. Alanda Amass, M.D.   Procedure Report  PROCEDURE:  Colonoscopy with biopsies.  ENDOSCOPIST:  Florencia Reasons, M.D.  INDICATIONS:  This is a 55 year old whom I have followed for the past 8 months or so because of constipation and intermittent small volume hematochezia, improved on miralax.  Meanwhile, review of Dr. Kathlynn Grate hysterectomy surgical report indicated the concern of a possible palpable mobile mass suggestive of a polyp in the lower sigmoid region.  The purpose of this procedure is primarily to exclude neoplasia at that location.  FINDINGS:  Submucosal 1.5 cm polypoid mass at about 32 cm in the sigmoid region.  INFORMED CONSENT:  The nature, purpose, and risks of the procedure have been discussed with the patient who provided written consent.  DESCRIPTION OF PROCEDURE:  Sedation was Fentanyl 150 mcg and Versed 15 mg IV without arrhythmias or desaturations.  The Olympus adjustable tension pediatric video colonoscope was advanced without too much difficulty to the cecum as identified by visualization of the appendiceal orifice, after which pullback was initiated.  The quality of the prep was excellent and it was felt that all areas were well seen.  The only abnormality on this exam was a 1.5 cm pedunculated or at least semi-pedunculated submucosal polypoid mass at about 32 cm from the external anal opening.  The overlying mucosa had an entirely normal appearance so this did not look like a typical polyp, although its morphology was polypoid.  On the other hand, it was not yellow like a lipoma would typically be.  Following the biopsies, the patient oozed from this area to a small degree for a persistent period of time, so I went  ahead and injected the lesion with approximately of 2 cc. of 1:10,000 epinephrine with good blanching, to help ensure hemostasis.  The remainder of the colon was normal.  I saw no other polypoid lesions, no cancer, colitis, vascular malformations, or any evident diverticulosis although the patient was felt to have diverticulosis present at the time of her surgery.  Retroflexion of the rectum was unremarkable and in particular, no definite source of her rectal bleeding was identified.  The patient tolerated this procedure well and there were no apparent complications.  IMPRESSION:  Submucosal polypoid mass in the sigmoid region as described above, pathology pending, otherwise normal exam.  PLAN: 1. Await pathology on todays biopsies. 2. Continue miralax for constipation, which seems to be working well for the     patient. DD:  01/04/00 TD:  01/04/00 Job: [id number]

## 2010-07-24 NOTE — Op Note (Signed)
[facility]  Patient:    [name], [name]                      MRN: [id number] Proc. Date: 07/28/99 Adm. Date:  [id number] Disc. Date: [id number] Attending:  Miguel Aschoff                           Operative Report  PREOPERATIVE DIAGNOSES:       1. Menorrhagia.                               2. Uterine fibroids.                               3. Urinary stress incontinence.                               4. Pelvic pain.  POSTOPERATIVE DIAGNOSES:      1. Menorrhagia.                               2. Uterine fibroids.                               3. Urinary stress incontinence.                               4. Pelvic pain.  OPERATION:                    1. Total abdominal hysterectomy.                               2. Bilateral salpingo-oophorectomy.                               3. Marshall-Marchetti procedure.  SURGEON:                      Miguel Aschoff, M.D.  ASSISTANT:                    Luvenia Redden, M.D.  ANESTHESIA:                   General  COMPLICATIONS:                None  JUSTIFICATIONS:               The patient is a 55 year old white female with a history of menorrhagia treated in the past with Lupron therapy to decrease the size of her previously noted uterine fibroids.  This did control the bleeding but symptoms have returned since the medication has been stopped and she presents now to undergo definitive therapy via total abdominal hysterectomy. In addition, the patient reports signs and symptoms of urinary stress incontinence and is to have this repaired at this procedure.  DESCRIPTION OF PROCEDURE:     The patient was taken to the operating room and placed in the supine position. General anesthesia was administered without  difficulty. She was then prepped and draped in the usual sterile fashion in modified lithotomy position.  Foley catheter was inserted with a 30 cc balloon and three way channel.  Pfannenstiel incision was then  made extended down through the subcutaneous tissue with bleeding points being clamped and coagulated as they were encountered.  The fascia was then identified, incised transversely and separated from the underlying rectus muscles. Rectus muscles were divided in the midline. Peritoneum was then identified and entered carefully avoiding underlying structures.   Peritoneal incision was then extended under direct visualization. After this was done, inspection of the pelvis showed the uterus to be enlarged approximately 8 to 10 weeks in size with multiple subserosal uterine fibroids.  The ovaries appear to be within normal limits. The cul-de-sac was unremarkable.  The sigmoid colon appeared to be redundant.  In addition, approximately 20 to 25 cm in the lower sigmoid colon there appeared to be diverticula but the below the diverticula appeared to be a mobile mass consistent with a polyp. No other abnormalities were noted. At this point, with the viscera packed out of the pelvis, self retaining retractor in place, the round ligaments were identified, clamped, cut and suture ligated using suture ligatures of 0 Vicryl.  Bladder flap was then created anteriorly and protected with bladder blade.  Broad ligament was then skeletonized and perforation was made below the utero-ovarian ligament and then the infundibulopelvic ligaments bilaterally were clamped with Heaney clamps, doubly ligated using ligatures of 0 Vicryl.  Again with the broad ligament being skeletonized, the uterine vessels were identified and clamped with curved Heaney clamps.  These pedicles were suture ligated using suture ligatures of 0 Vicryl. Then in serial fashion, paracervical fashion, cardinal ligaments and uterosacral ligaments were clamped, cut and suture ligated using suture ligatures of 0 Vicryl.  It was then possible to enter the vaginal vault and the cervix was cut free from the vaginal fornices.  The angles of the vaginal  cuff were then ligated using figure-of-eight sutures of 0 Vicryl. The uterosacral ligaments were incorporated for support and then the cuff was run using running interlocking 0 Vicryl suture and a single suture was placed in the midline to approximate the anterior and posterior portions of the cuff. Inspection was made for hemostasis.  Hemostasis appeared to be excellent. At this point, the pelvic floor was reperitonealized using running continuous 2-0 Vicryl suture.  Attention was then directed to the Space of Retzius. This area was opened and placed in the vagina. It was possible to palpate the urethrovesical junction with the large Foley being present and then using 0 Vicryl sutures on a UR6 needle bites were taken just lateral to the urethrovesical angle to the right and left of the Foley catheter and transfixed into the periosteum of the symphysis. This resulted in good elevation of the urethrovesical angle with just two sutures.  There appeared to be excellent hemostasis in this area.  At this point, lap counts were taken and found to be correct.  The parietal peritoneum was then closed using running continuous 0 Vicryl suture.  The fascia was closed using running continuous 0 Vicryl suture. Prior to closure of the fascia a suprapubic catheter was placed under direct visualization with clear urine being obtained.  After the fascia was closed, two interrupted sutures of 0 Vicryl were used to close the subcutaneous tissue and then skin incision was closed using staples.  The estimated blood loss was approximately 120 cc. The patient tolerated the procedure  well and went to the recovery room in satisfactory condition.  ADDENDUM:  The patient will be referred back to her gastroenterologist to have further assessment of the sigmoid colon and a colonoscopy. DD:  07/28/99 TD:  08/01/99 Job: [id number]

## 2010-07-24 NOTE — Cardiovascular Report (Signed)
[name], [name]               ACCOUNT NO.:  [id number]   MEDICAL RECORD NO.:  [id number]          PATIENT TYPE:  OIB   LOCATION:  2899                         FACILITY:  [facility]   PHYSICIAN:  Cristy Hilts. Jacinto Halim, MD       DATE OF BIRTH:  10/06/1955   DATE OF PROCEDURE:  10/02/2004  DATE OF DISCHARGE:                              CARDIAC CATHETERIZATION   PROCEDURE PERFORMED:  1.  Left ventriculography.  2.  Selective right and left coronary arteriography.  3.  Abdominal aortogram.  4.  Closure of right femoral arterial access with Angio-Seal.   INDICATION:  [name] is a 55 year old female with history of  hypertension, diabetes-diet controlled, dyslipidemia with reduced HDL.  She  had an abdominal Cardiolite stress test showing an anterior wall ischemia.  She has also had abnormal EKG in the form of left bundle branch block and  low normal ejection fraction by echocardiogram.  Given this, she was brought  to the cardiac catheterization to evaluate her coronary anatomy.   HEMODYNAMIC DATA:  1.  The left ventricular pressures were 154/12 with end-diastolic pressure      of 13 mmHg.  2.  Aortic pressure 152/90 with a mean of 116 mmHg.  3.  There was no pressure gradient across the aortic valve.   ANGIOGRAPHIC DATA:  Left ventricle:  Left ventricular systolic function was  in the lower limit of normal, and the estimated around 50-55%.  There was no  significant mitral regurgitation.   Right coronary artery:  The right coronary artery is a large caliber vessel  and a dominant vessel.  It is normal.  It gives origin to large PDA and a  PLA.   Left main coronary artery:  Left main coronary artery is a large caliber  vessel.  It is normal.   LAD:  LAD is a large caliber vessel.  It gives origin to three small  diagonals in the proximal and mid segment.  Otherwise, the LAD is mildly  tortuous and is normal.   Circumflex:  Circumflex coronary artery is a large caliber vessel.   It  contuses the large obtuse marginal one.  The obtuse marginal one itself has  several secondary branches.  This is normal, but mildly tortuous.   ABDOMINAL AORTOGRAM:  Revealed presence of two renal arteries; one on either  side.  They were widely patent.   FINAL INTERPRETATION:  1.  Low normal left ventricular systolic function, ejection fraction 50-55%,      mild global hypokinesis, mild left ventricular dilatation.  2.  No significant mitral regurgitation.  3.  Normal coronary arteries, right dominant circulation.  4.  Normal renal arteries.  No evidence of renal artery stenosis.   RECOMMENDATIONS:  Continue primary prevention as indicated.   The patient can be taken up for the upcoming cervical neck surgery by Dr.  Wynetta Emery with acceptable risks for cardiovascular morbidity and mortality.   TECHNIQUE OF PROCEDURE:  Under usual sterile precautions, using a 6 French  right femoral arterial access, a 6 [place] multipurpose P2 catheter was  advanced into the  ascending aorta over a 0.035-inch J wire.  The catheter  was then gently advanced to the left ventricle and left ventricular  pressures were monitored.  Hand contrast injection of the left ventricle was  performed both in the LAO and RAO projection.  The catheter was flushed with  saline and pulled back into the ascending aorta and pressure gradient across  the aortic valve was monitored.  The right coronary artery was selectively  engaged and angiography was performed.  In a similar fashion,  left main  coronary artery was selectively engaged and angiography was performed.  Then, the catheter was pulled back in the abdominal aorta and abdominal  aortogram was performed.  Then, a 6 French angled pigtail catheter was  utilized to perform the left ventriculography in the RAO projection.  Then,  the catheter was pulled out of the body in the usual fashion.  Right femoral  angiography was performed through the arterial access sheath.   The access  was closed with Angio-Seal with excellent hemostasis obtained.  No immediate  complication noted.       JRG/MEDQ  D:  10/02/2004  T:  10/02/2004  Job:  [id number]   cc:   Donalee Citrin, M.D.  [address]  [place]  [place] [address]  Fax: [phone]

## 2010-08-04 ENCOUNTER — Encounter: Payer: Self-pay | Admitting: Internal Medicine

## 2010-08-04 ENCOUNTER — Ambulatory Visit (INDEPENDENT_AMBULATORY_CARE_PROVIDER_SITE_OTHER): Payer: PRIVATE HEALTH INSURANCE | Admitting: Internal Medicine

## 2010-08-04 DIAGNOSIS — I10 Essential (primary) hypertension: Secondary | ICD-10-CM

## 2010-08-04 DIAGNOSIS — Z95810 Presence of automatic (implantable) cardiac defibrillator: Secondary | ICD-10-CM

## 2010-08-04 DIAGNOSIS — I509 Heart failure, unspecified: Secondary | ICD-10-CM

## 2010-08-04 DIAGNOSIS — I5022 Chronic systolic (congestive) heart failure: Secondary | ICD-10-CM

## 2010-08-04 DIAGNOSIS — I428 Other cardiomyopathies: Secondary | ICD-10-CM

## 2010-08-04 NOTE — Assessment & Plan Note (Signed)
Her device is working normally. We'll recheck in several months. 

## 2010-08-04 NOTE — Assessment & Plan Note (Signed)
Her symptoms are class II. She will continue her current medications.

## 2010-08-04 NOTE — Progress Notes (Signed)
HPI [name] returns today for followup. She is a pleasant middle-aged woman with an ischemic cardiomyopathy and congestive heart failure. She is status post biventricular ICD. Today she notes that she has been out of her beta blocker for a couple of days. She has mild chest pain. She has dyspnea. She has seen her primary cardiologist and has a stress test scheduled. She has not had any ICD shocks. Not on File   Current Outpatient Prescriptions  Medication Sig Dispense Refill  . Aliskiren-Valsartan (VALTURNA) 300-320 MG TABS Take 1 tablet by mouth daily. 1 TAB QD       . aspirin 325 MG EC tablet Take 325 mg by mouth daily.        . Black Cohosh 540 MG CAPS Take 1 capsule by mouth daily. 1 TAB QD       . carvedilol (COREG CR) 20 MG 24 hr capsule Take 20 mg by mouth at bedtime. 1 TAB QHS       . Cholecalciferol (VITAMIN D) 2000 UNITS CAPS Take 1 capsule by mouth daily. 1 TAB QD       . Choline Fenofibrate (TRILIPIX) 135 MG capsule Take 135 mg by mouth daily. 1 TAB QD      . estrogen-methyltestosterone (ESTRATEST) 1.25-2.5 MG per tablet Take 1 tablet by mouth at bedtime. 1 TAB QHS       . imipramine (TOFRANIL) 25 MG tablet Take 25 mg by mouth at bedtime. 2 TABS QHS       . Liraglutide (VICTOZA) 18 MG/3ML SOLN Inject 1.2 mg into the skin daily. 1.2 MG/INJECTABLE 1 TIME DAILY       . metFORMIN (GLUCOPHAGE) 1000 MG tablet Take 1,000 mg by mouth 2 (two) times daily with a meal.        . metroNIDAZOLE (METROGEL) 1 % gel Apply 1 application topically daily.        . montelukast (SINGULAIR) 10 MG tablet Take 10 mg by mouth at bedtime.        . Omega-3 Fatty Acids (FISH OIL) 1200 MG CAPS Take 1 capsule by mouth daily. 1 CAP QD       . rizatriptan (MAXALT) 10 MG tablet Take 10 mg by mouth as needed. May repeat in 2 hours if needed       . rosuvastatin (CRESTOR) 10 MG tablet Take 5 mg by mouth at bedtime. 1 TAB QHS      . spironolactone (ALDACTONE) 25 MG tablet Take 25 mg by mouth 2 (two) times daily.         Marland Kitchen torsemide (DEMADEX) 20 MG tablet Take 20 mg by mouth every other day. 1 TAB QOD       . valsartan (DIOVAN) 320 MG tablet Take 320 mg by mouth daily.        . verapamil (CALAN-SR) 240 MG CR tablet Take 240 mg by mouth at bedtime. 1 TAB QHS       . vitamin E 400 UNIT capsule Take 400 Units by mouth daily. 3 CAPS QD       . DISCONTD: pantoprazole (PROTONIX) 40 MG tablet Take 40 mg by mouth as needed.        Marland Kitchen DISCONTD: potassium chloride SA (K-DUR,KLOR-CON) 20 MEQ tablet Take 20 mEq by mouth at bedtime. 1 TAB QHS          Past Medical History  Diagnosis Date  . Hyperlipidemia   . CHF (congestive heart failure)   . Hypertension   . Diabetes mellitus   .  Automatic implantable cardiac defibrillator in situ     ST. JUDE MODEL 7122  . Unspecified sleep apnea     ROS:   All systems reviewed and negative except as noted in the HPI.   No past surgical history on file.   Family History  Problem Relation Age of Onset  . Heart attack Father      History   Social History  . Marital Status: Married    Spouse Name: N/A    Number of Children: N/A  . Years of Education: N/A   Occupational History  . Not on file.   Social History Main Topics  . Smoking status: Former Smoker    Types: Cigarettes    Quit date: 03/08/1980  . Smokeless tobacco: Not on file  . Alcohol Use: Yes  . Drug Use: No  . Sexually Active:    Other Topics Concern  . 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     BP 154/84  Pulse 93  Resp 16  Ht 5\' 7"  (1.702 m)  Wt 224 lb (101.606 kg)  BMI 35.08 kg/m2  Physical Exam: Obese, well appearing NAD HEENT: Unremarkable Neck:  No JVD, no thyromegally Lymphatics:  No adenopathy Back:  No CVA tenderness Lungs:  Clear HEART:  Regular rate rhythm, there is a soft S4. Abd:  obese positive bowel sounds, no organomegally, no rebound, no guarding Ext:  2 plus pulses, no edema, no cyanosis, no clubbing Skin:  No  rashes no nodules Neuro:  CN II through XII intact, motor grossly intact  DEVICE  Normal device function.  See PaceArt for details.   Assess/Plan:

## 2010-08-04 NOTE — Patient Instructions (Signed)
Your physician wants you to follow-up in: 12 months with Dr Court Joy will receive a reminder letter in the mail two months in advance. If you don't receive a letter, please call our office to schedule the follow-up appointment.   Take Bystolic 10mg  (1/2 of a 20mg ) twice daily until you get your Coreg refilled

## 2010-08-04 NOTE — Assessment & Plan Note (Signed)
Her blood pressure is elevated today. She has been off of her beta blocker. I've given her samples of Bystolic to take until she is able to refill her Coreg. I have asked her to maintain a low sodium diet. She is instructed to lose weight. She is instructed to exercise.

## 2010-08-28 HISTORY — PX: CARDIOVASCULAR STRESS TEST: SHX262

## 2010-11-05 ENCOUNTER — Ambulatory Visit (INDEPENDENT_AMBULATORY_CARE_PROVIDER_SITE_OTHER): Payer: PRIVATE HEALTH INSURANCE | Admitting: *Deleted

## 2010-11-05 ENCOUNTER — Other Ambulatory Visit: Payer: Self-pay | Admitting: Internal Medicine

## 2010-11-05 ENCOUNTER — Encounter: Payer: Self-pay | Admitting: Internal Medicine

## 2010-11-05 DIAGNOSIS — Z95810 Presence of automatic (implantable) cardiac defibrillator: Secondary | ICD-10-CM

## 2010-11-05 DIAGNOSIS — I428 Other cardiomyopathies: Secondary | ICD-10-CM

## 2010-11-05 LAB — REMOTE ICD DEVICE
AL IMPEDENCE ICD: 450 Ohm
BAMS-0003: 70 {beats}/min
HV IMPEDENCE: 70 Ohm
LV LEAD IMPEDENCE ICD: 460 Ohm
RV LEAD AMPLITUDE: 11.7 mv
TZAT-0001FASTVT: 1
TZAT-0001SLOWVT: 1
TZAT-0004SLOWVT: 8
TZAT-0012SLOWVT: 200 ms
TZAT-0019FASTVT: 7.5 V
TZAT-0019SLOWVT: 7.5 V
TZAT-0020FASTVT: 1 ms
TZON-0003FASTVT: 300 ms
TZON-0004FASTVT: 24
TZON-0005FASTVT: 6
TZON-0005SLOWVT: 6
TZON-0010FASTVT: 80 ms
TZST-0001FASTVT: 3
TZST-0001SLOWVT: 3
TZST-0001SLOWVT: 4
TZST-0001SLOWVT: 5
TZST-0003FASTVT: 25 J
TZST-0003FASTVT: 36 J
TZST-0003FASTVT: 36 J
TZST-0003SLOWVT: 15 J
TZST-0003SLOWVT: 25 J
VENTRICULAR PACING ICD: 100 pct

## 2010-11-18 ENCOUNTER — Encounter: Payer: Self-pay | Admitting: *Deleted

## 2010-11-19 NOTE — Progress Notes (Signed)
ICD checked by remote. 

## 2010-12-01 LAB — POCT I-STAT, CHEM 8
BUN: 13
Creatinine, Ser: 1.1
Hemoglobin: 13.3
Potassium: 4
Sodium: 141

## 2010-12-01 LAB — DIFFERENTIAL
Lymphocytes Relative: 27
Lymphs Abs: 2.4
Neutrophils Relative %: 64

## 2010-12-01 LAB — CBC
Hemoglobin: 11.9 — ABNORMAL LOW
MCHC: 33.2
Platelets: 340
RDW: 15.3

## 2010-12-01 LAB — URINALYSIS, ROUTINE W REFLEX MICROSCOPIC
Glucose, UA: NEGATIVE
Hgb urine dipstick: NEGATIVE
Specific Gravity, Urine: 1.026
Urobilinogen, UA: 0.2

## 2010-12-07 LAB — GLUCOSE, CAPILLARY
Glucose-Capillary: 106 — ABNORMAL HIGH
Glucose-Capillary: 127 — ABNORMAL HIGH
Glucose-Capillary: 129 — ABNORMAL HIGH
Glucose-Capillary: 197 — ABNORMAL HIGH

## 2010-12-07 LAB — BASIC METABOLIC PANEL
CO2: 31
Calcium: 9.9
GFR calc Af Amer: >60
Sodium: 139

## 2010-12-09 DIAGNOSIS — M79606 Pain in leg, unspecified: Secondary | ICD-10-CM

## 2010-12-09 HISTORY — DX: Pain in leg, unspecified: M79.606

## 2011-02-04 ENCOUNTER — Ambulatory Visit (INDEPENDENT_AMBULATORY_CARE_PROVIDER_SITE_OTHER): Payer: PRIVATE HEALTH INSURANCE | Admitting: *Deleted

## 2011-02-04 DIAGNOSIS — Z95810 Presence of automatic (implantable) cardiac defibrillator: Secondary | ICD-10-CM

## 2011-02-04 DIAGNOSIS — I428 Other cardiomyopathies: Secondary | ICD-10-CM

## 2011-02-05 ENCOUNTER — Encounter: Payer: Self-pay | Admitting: Internal Medicine

## 2011-02-05 ENCOUNTER — Other Ambulatory Visit: Payer: Self-pay | Admitting: Internal Medicine

## 2011-02-05 LAB — REMOTE ICD DEVICE
ATRIAL PACING ICD: 2.3 pct
BATTERY VOLTAGE: 2.63 V
DEVICE MODEL ICD: 540466
HV IMPEDENCE: 73 Ohm
LV LEAD IMPEDENCE ICD: 460 Ohm
RV LEAD IMPEDENCE ICD: 460 Ohm
TZAT-0001FASTVT: 1
TZAT-0004FASTVT: 8
TZAT-0004SLOWVT: 8
TZAT-0013SLOWVT: 3
TZAT-0018FASTVT: NEGATIVE
TZAT-0018SLOWVT: NEGATIVE
TZAT-0019FASTVT: 7.5 V
TZAT-0019SLOWVT: 7.5 V
TZAT-0020FASTVT: 1 ms
TZON-0003FASTVT: 300 ms
TZON-0003SLOWVT: 330 ms
TZON-0004FASTVT: 24
TZON-0004SLOWVT: 35
TZON-0005FASTVT: 6
TZON-0010FASTVT: 80 ms
TZST-0001FASTVT: 3
TZST-0001FASTVT: 4
TZST-0001SLOWVT: 3
TZST-0001SLOWVT: 5
TZST-0003FASTVT: 25 J
TZST-0003FASTVT: 36 J
TZST-0003SLOWVT: 25 J
VENTRICULAR PACING ICD: 100 pct

## 2011-02-09 NOTE — Progress Notes (Signed)
Remote icd check  

## 2011-02-12 ENCOUNTER — Encounter: Payer: Self-pay | Admitting: *Deleted

## 2011-05-10 ENCOUNTER — Ambulatory Visit (INDEPENDENT_AMBULATORY_CARE_PROVIDER_SITE_OTHER): Payer: PRIVATE HEALTH INSURANCE | Admitting: Internal Medicine

## 2011-05-10 VITALS — BP 140/84 | HR 84 | Temp 98.7°F | Resp 18 | Ht 65.0 in | Wt 230.0 lb

## 2011-05-10 DIAGNOSIS — L03818 Cellulitis of other sites: Secondary | ICD-10-CM

## 2011-05-10 DIAGNOSIS — N61 Mastitis without abscess: Secondary | ICD-10-CM

## 2011-05-10 DIAGNOSIS — L02818 Cutaneous abscess of other sites: Secondary | ICD-10-CM

## 2011-05-10 MED ORDER — DOXYCYCLINE HYCLATE 100 MG PO TABS: 100.0000 mg | ORAL_TABLET | Freq: Two times a day (BID) | ORAL | Status: AC

## 2011-05-10 NOTE — Patient Instructions (Signed)
Change dressing at least once daily and cleanse area with soap and water.  RTC in 2-3 days for dressing change.  Take doxycycline twice daily with food.    Cellulitis Cellulitis is an infection of the skin and the tissue beneath it. The area is typically red and tender. It is caused by germs (bacteria) (usually staph or strep) that enter the body through cuts or sores. Cellulitis most commonly occurs in the arms or lower legs.  HOME CARE INSTRUCTIONS   If you are given a prescription for medications which kill germs (antibiotics), take as directed until finished.   If the infection is on the arm or leg, keep the limb elevated as able.   Use a warm cloth several times per day to relieve pain and encourage healing.   See your caregiver for recheck of the infected site as directed if problems arise.   Only take over-the-counter or prescription medicines for pain, discomfort, or fever as directed by your caregiver.  SEEK MEDICAL CARE IF:   The area of redness (inflammation) is spreading, there are red streaks coming from the infected site, or if a part of the infection begins to turn dark in color.   The joint or bone underneath the infected skin becomes painful after the skin has healed.   The infection returns in the same or another area after it seems to have gone away.   A boil or bump swells up. This may be an abscess.   New, unexplained problems such as pain or fever develop.  SEEK IMMEDIATE MEDICAL CARE IF:   You have a fever.   You or your child feels drowsy or lethargic.   There is vomiting, diarrhea, or lasting discomfort or feeling ill (malaise) with muscle aches and pains.  MAKE SURE YOU:   Understand these instructions.   Will watch your condition.   Will get help right away if you are not doing well or get worse.  Document Released: 12/02/2004 Document Revised: 02/11/2011 Document Reviewed: 10/11/2007 [facility] Patient Information 2012 [place], [place].

## 2011-05-10 NOTE — Progress Notes (Signed)
  Subjective:    Patient ID: [name], female    DOB: 1955-06-30, 56 y.o.   MRN: [id number]  Wound Check She was originally treated 3 to 5 days ago. There has been colored discharge from the wound. The redness has improved. The pain has not changed.  [name] is here with a right breast abcess that started about 5 days ago.  It is draining as she has put warm compresses on it and has actually gone down in size but comes in for I & D.  She has had these before on her left breast last year and her chin which were treated at [facility] and positive for MRSA.  She is very concerned she has it again.  No other systemic symptoms.    Review of Systems  Constitutional: Negative.  Negative for fever.  HENT: Negative.   Eyes: Negative.   Respiratory: Negative.   Cardiovascular: Negative.   Gastrointestinal: Negative.   Genitourinary: Negative.   Skin: Positive for wound.  Neurological: Negative.   Hematological: Negative.   Psychiatric/Behavioral: Negative.   All other systems reviewed and are negative.       Objective:   Physical Exam  Vitals reviewed. Constitutional: She is oriented to person, place, and time. She appears well-developed and well-nourished.  Cardiovascular: Normal rate, regular rhythm and normal heart sounds.   Pulmonary/Chest: Effort normal and breath sounds normal.  Neurological: She is alert and oriented to person, place, and time.  Skin: Rash noted. There is erythema.     Psychiatric: She has a normal mood and affect. Her behavior is normal.  Procedure I & D:  Right breast wound cleansed with betadine and alcohol.  3cc 1% lidocaine with epi injected and #11 blade used to make a 1 cm laceration.  Wound Cx obtained.  Bloody yellow drainage with sebaceous material expressed, wound extends above opening.  1/4 inch packing used and taped and telfa, gauze, and hypofix applied.  Wound instructions given.          Assessment & Plan:  Right Breast Abcess.  Wound Cx  pending.  Doxycycline 100 mg BID for 10 days.  AVS printed with wound instruction.  RTC in 2 days, fast pass given.

## 2011-05-12 ENCOUNTER — Ambulatory Visit (INDEPENDENT_AMBULATORY_CARE_PROVIDER_SITE_OTHER): Payer: PRIVATE HEALTH INSURANCE | Admitting: Internal Medicine

## 2011-05-12 VITALS — BP 118/71 | HR 82 | Temp 98.2°F | Resp 16 | Ht 67.0 in | Wt 230.0 lb

## 2011-05-12 DIAGNOSIS — L0390 Cellulitis, unspecified: Secondary | ICD-10-CM

## 2011-05-12 DIAGNOSIS — L0291 Cutaneous abscess, unspecified: Secondary | ICD-10-CM

## 2011-05-12 NOTE — Progress Notes (Signed)
  Subjective:    Patient ID: [name], female    DOB: Jul 22, 1955, 56 y.o.   MRN: [id number]  HPI  [name] is s/p I & D of her right breast 2 days ago.  Cx is still pending.  She has some soreness there but less than before.  Dressings have had bloody drainage.  No fever, chills.  Tol Doxycycline well.    Review of Systems  All other systems reviewed and are negative.       Objective:   Physical Exam  Vitals reviewed.   RIght breast dressing with bloody drainage removed with packing.  5 cc 2% lidocaine used to irrigate.  Small amount of bloody drainage and sebaceous material expressed.  Wound with some induration .  Repacked with small amount of 1/4 inch packing, telfa, gauze, applied.        Assessment & Plan:  Right breast cyst/abcess.  Continue doxycycline.  RTC in 2 days for dressing change.

## 2011-05-13 ENCOUNTER — Ambulatory Visit (INDEPENDENT_AMBULATORY_CARE_PROVIDER_SITE_OTHER): Payer: PRIVATE HEALTH INSURANCE | Admitting: *Deleted

## 2011-05-13 ENCOUNTER — Encounter: Payer: Self-pay | Admitting: Internal Medicine

## 2011-05-13 DIAGNOSIS — Z95810 Presence of automatic (implantable) cardiac defibrillator: Secondary | ICD-10-CM

## 2011-05-13 DIAGNOSIS — I428 Other cardiomyopathies: Secondary | ICD-10-CM

## 2011-05-13 LAB — WOUND CULTURE

## 2011-05-14 ENCOUNTER — Ambulatory Visit (INDEPENDENT_AMBULATORY_CARE_PROVIDER_SITE_OTHER): Payer: PRIVATE HEALTH INSURANCE | Admitting: Physician Assistant

## 2011-05-14 VITALS — BP 125/72 | HR 72 | Temp 98.3°F | Resp 18 | Wt 228.8 lb

## 2011-05-14 DIAGNOSIS — N61 Mastitis without abscess: Secondary | ICD-10-CM

## 2011-05-14 DIAGNOSIS — L723 Sebaceous cyst: Secondary | ICD-10-CM

## 2011-05-14 DIAGNOSIS — L089 Local infection of the skin and subcutaneous tissue, unspecified: Secondary | ICD-10-CM

## 2011-05-14 LAB — REMOTE ICD DEVICE
AL AMPLITUDE: 3.4 mv
ATRIAL PACING ICD: 1.7 pct
BAMS-0003: 70 {beats}/min
BRDY-0002RA: 60 {beats}/min
RV LEAD IMPEDENCE ICD: 440 Ohm
TZAT-0001SLOWVT: 1
TZAT-0004SLOWVT: 8
TZAT-0012FASTVT: 200 ms
TZAT-0012SLOWVT: 200 ms
TZAT-0013FASTVT: 1
TZAT-0020SLOWVT: 1 ms
TZON-0003FASTVT: 300 ms
TZON-0005FASTVT: 6
TZST-0001FASTVT: 5
TZST-0001SLOWVT: 3
TZST-0001SLOWVT: 4
TZST-0003FASTVT: 36 J
TZST-0003FASTVT: 36 J
TZST-0003SLOWVT: 15 J
VENTRICULAR PACING ICD: 100 pct

## 2011-05-14 NOTE — Progress Notes (Signed)
  Subjective:    Patient ID: [name], female    DOB: 23-Jun-1955, 56 y.o.   MRN: [id number]  HPI  Pt here for recheck of infected sebaceous cyst on R breast - feels much better today, tolerating Doxy ok.  Minimal d/c from area.   Review of Systems     Objective:   Physical Exam  Packing removed without any drainage expressed or sebaceous material.  No erythema around area and minimal induration around wound.  Irrigated with 1% lido and repacked with <1cm pf packing.  Drsg placed.      Assessment & Plan:  Pt to f/u on Monday only if problems.  She is to remove packing on Monday if it has not fallen out by itself.  Answered pts questions.

## 2011-05-19 NOTE — Progress Notes (Signed)
Remote defib check  

## 2011-05-28 ENCOUNTER — Encounter (HOSPITAL_COMMUNITY): Payer: Self-pay

## 2011-05-28 ENCOUNTER — Emergency Department (INDEPENDENT_AMBULATORY_CARE_PROVIDER_SITE_OTHER)
Admission: EM | Admit: 2011-05-28 | Discharge: 2011-05-28 | Disposition: A | Discharge status: Home / Self Care | Payer: PRIVATE HEALTH INSURANCE | Source: Home / Self Care | Attending: Family Medicine | Admitting: Family Medicine

## 2011-05-28 DIAGNOSIS — J309 Allergic rhinitis, unspecified: Secondary | ICD-10-CM

## 2011-05-28 MED ORDER — AZITHROMYCIN 250 MG PO TABS: 250.0000 mg | ORAL_TABLET | Freq: Every day | ORAL | Status: AC

## 2011-05-28 MED ORDER — FEXOFENADINE-PSEUDOEPHED ER 60-120 MG PO TB12: 1.0000 | ORAL_TABLET | Freq: Every day | ORAL | Status: DC | PRN

## 2011-05-28 NOTE — ED Notes (Signed)
C/o post nasal drainage, sinus pressure, sore throat and cough- sx started yesterday.  States she uses CPAP at night, reports she was so congested last night she could not sleep.

## 2011-05-28 NOTE — ED Provider Notes (Signed)
History     CSN: [id number]  Arrival date & time 05/28/11  1611   First MD Initiated Contact with Patient 05/28/11 1615      Chief Complaint  Patient presents with  . Sinusitis    (Consider location/radiation/quality/duration/timing/severity/associated sxs/prior treatment) HPI Comments: 56 year old female with complex medical history significant for diabetes, CHF and sleep apnea. Patient also has a cardiac defibrillator. He states he has a history of recurrent and sinus infection. Comes complaining of nasal congestion sinus pressure, headache, bilateral ear pain and subjective fever since last night. Nasal congestion making very difficult to use her CPAP machine at night. Rhinorrhea is clear also associated with watery eyes. States she wants to treat her symptoms early to avoid bad sinusitis as she've had in the past.   Past Medical History  Diagnosis Date  . Hyperlipidemia   . CHF (congestive heart failure)   . Hypertension   . Diabetes mellitus   . Automatic implantable cardiac defibrillator in situ     ST. JUDE MODEL 7122  . Unspecified sleep apnea     Past Surgical History  Procedure Date  . Abdominal hysterectomy   . Neck surgery   . Pacemaker insertion   . Cardiac defibrillator placement   . Knee surgery     Family History  Problem Relation Age of Onset  . Heart attack Father     History  Substance Use Topics  . Smoking status: Former Smoker    Types: Cigarettes    Quit date: 03/09/1975  . Smokeless tobacco: Not on file  . Alcohol Use: Yes    OB History    Grav Para Term Preterm Abortions TAB SAB Ect Mult Living                  Review of Systems  Constitutional: Negative for fever and chills.  HENT: Positive for ear pain, congestion, sore throat, rhinorrhea, sneezing and sinus pressure. Negative for facial swelling, trouble swallowing, voice change and ear discharge.   Respiratory: Positive for cough. Negative for chest tightness, shortness of  breath and wheezing.   Cardiovascular: Negative for chest pain, palpitations and leg swelling.  Musculoskeletal: Negative for myalgias and arthralgias.  Skin: Negative for rash.  Neurological: Positive for headaches.    Allergies  Niacin and related  Home Medications   Current Outpatient Rx  Name Route Sig Dispense Refill  . ASPIRIN 325 MG PO TBEC Oral Take 325 mg by mouth daily.      Marland Kitchen BLACK COHOSH 540 MG PO CAPS Oral Take 1 capsule by mouth daily. 1 TAB QD     . CARVEDILOL PHOSPHATE ER 20 MG PO CP24 Oral Take 80 mg by mouth at bedtime. 1 TAB QHS    . CHOLINE FENOFIBRATE 135 MG PO CPDR Oral Take 135 mg by mouth daily. 1 TAB QD    . EST ESTROGENS-METHYLTEST 1.25-2.5 MG PO TABS Oral Take 1 tablet by mouth at bedtime. 1 TAB QHS     . IMIPRAMINE HCL 25 MG PO TABS Oral Take 25 mg by mouth at bedtime. 2 TABS QHS     . LIRAGLUTIDE 18 MG/3ML [place] SOLN Subcutaneous Inject 1.8 mg into the skin daily. 1.2 MG/INJECTABLE 1 TIME DAILY    . METFORMIN HCL 1000 MG PO TABS Oral Take 1,000 mg by mouth 2 (two) times daily with a meal.      . METRONIDAZOLE 1 % EX GEL Topical Apply 1 application topically daily.      Marland Kitchen MONTELUKAST  SODIUM 10 MG PO TABS Oral Take 10 mg by mouth at bedtime.      Marland Kitchen FISH OIL 1200 MG PO CAPS Oral Take 1 capsule by mouth daily. 1 CAP QD     . RIZATRIPTAN BENZOATE 10 MG PO TABS Oral Take 10 mg by mouth as needed. May repeat in 2 hours if needed     . ROSUVASTATIN CALCIUM 10 MG PO TABS Oral Take 5 mg by mouth at bedtime. 1 TAB QHS    . SPIRONOLACTONE 25 MG PO TABS Oral Take 25 mg by mouth 2 (two) times daily.      . TORSEMIDE 20 MG PO TABS Oral Take 20 mg by mouth every other day. 1 TAB QOD     . VALSARTAN 320 MG PO TABS Oral Take 320 mg by mouth daily.      Marland Kitchen VERAPAMIL HCL 240 MG PO TBCR Oral Take 240 mg by mouth at bedtime. 1 TAB QHS     . VITAMIN E 400 UNITS PO CAPS Oral Take 400 Units by mouth daily. 3 CAPS QD     . ALISKIREN-VALSARTAN 300-320 MG PO TABS Oral Take 1 tablet by  mouth daily. 1 TAB QD     . AZITHROMYCIN 250 MG PO TABS Oral Take 1 tablet (250 mg total) by mouth daily. Take first 2 tablets together, then 1 every day until finished. 6 tablet 0    Hold and fill if persistent or worsening symptoms  ...  . VITAMIN D 2000 UNITS PO CAPS Oral Take 1 capsule by mouth daily. 1 TAB QD     . FEXOFENADINE-PSEUDOEPHED ER 60-120 MG PO TB12 Oral Take 1 tablet by mouth daily as needed. 30 tablet 0    BP 132/77  Pulse 89  Temp(Src) 98.2 F (36.8 C) (Oral)  Resp 18  SpO2 96%  Physical Exam  Nursing note and vitals reviewed. Constitutional: She is oriented to person, place, and time. She appears well-developed and well-nourished. No distress.  HENT:  Head: Normocephalic and atraumatic.       Nasal Congestion with erythema and swelling of nasal turbinates, clear rhinorrhea. Pharyngeal erythema no exudates. No uvula deviation. No trismus. TM's with increased vascular markings impress clear fluid behind.  No swelling or bulging   Eyes: Conjunctivae and EOM are normal. Pupils are equal, round, and reactive to light. Right eye exhibits no discharge. Left eye exhibits no discharge.  Neck: Neck supple. No thyromegaly present.  Cardiovascular: Normal rate, regular rhythm and normal heart sounds.  Exam reveals no gallop and no friction rub.   No murmur heard. Pulmonary/Chest: Breath sounds normal. She has no wheezes. She has no rales.  Lymphadenopathy:    She has no cervical adenopathy.  Neurological: She is alert and oriented to person, place, and time.    ED Course  Procedures (including critical care time)  Labs Reviewed - No data to display No results found.   1. Allergic rhinitis       MDM  Impress allergic rhinitis. Patient already taking Singulair. States her diabetes has been poorly controlled in the last couple months. Will refrain from using prednisone to avoid worsening her diabetes. Recommend that addition of Allegra with pseudoephedrine but ask  patient to take it only once a day for 2 or 3 days and no more than 5 days given her comorbidities. After her congestion improves she can continue to take Allegra or Zyrtec without pseudoephedrine over-the-counter along with the Singulair. Asked to discuss prescription for Flonase  with her primary care provider. As per patient request a prescription for a azithromycin was provided to fill only if persistent sinus pain and drainage after 7-10 days.        Sharin Grave, MD 05/29/11 1517

## 2011-05-28 NOTE — Discharge Instructions (Signed)
keep well hydrated. Take the prescribed medications as instructed. Can continue to take plain Zyrtec or Allegra without the pseudoephedrine during allergy season once your symptoms improve. It is okay to combine with the Singulair. Discuss with your primary care provider starting therapy with nasal steroid like Flonase. Avoid taking the Allegra with pseudoephedrine for longer than 5 days and given your history of high blood pressure and heart problems (It can cause high blood pressure and Heartbeat). Monitor your blood pressure while taking this medication. If palpitations. Can take ibuprofen every 8 hours or tylenol every 6 hours as needed for fever or pain. Use nasal saline spray at least 3 times a day. (simply saline is over the counter) Start the prescribed antibiotic only if no improvement of your symptoms after 7-10 days. Can followup with the ENT if recurrent or worsening symptoms as needed constant information provided above.

## 2011-06-07 ENCOUNTER — Encounter: Payer: Self-pay | Admitting: *Deleted

## 2011-06-25 DIAGNOSIS — I10 Essential (primary) hypertension: Secondary | ICD-10-CM

## 2011-06-25 HISTORY — DX: Essential (primary) hypertension: I10

## 2011-06-25 HISTORY — PX: DOPPLER ECHOCARDIOGRAPHY: SHX263

## 2011-09-03 ENCOUNTER — Encounter: Payer: Self-pay | Admitting: Internal Medicine

## 2011-09-03 ENCOUNTER — Ambulatory Visit (INDEPENDENT_AMBULATORY_CARE_PROVIDER_SITE_OTHER): Payer: PRIVATE HEALTH INSURANCE | Admitting: Internal Medicine

## 2011-09-03 VITALS — BP 126/83 | HR 86 | Ht 67.0 in | Wt 225.0 lb

## 2011-09-03 DIAGNOSIS — I10 Essential (primary) hypertension: Secondary | ICD-10-CM

## 2011-09-03 DIAGNOSIS — I5022 Chronic systolic (congestive) heart failure: Secondary | ICD-10-CM

## 2011-09-03 DIAGNOSIS — Z95810 Presence of automatic (implantable) cardiac defibrillator: Secondary | ICD-10-CM

## 2011-09-03 LAB — ICD DEVICE OBSERVATION
AL AMPLITUDE: 3.9 mv
AL THRESHOLD: 0.25 V
ATRIAL PACING ICD: 1 pct
LV LEAD IMPEDENCE ICD: 500 Ohm
RV LEAD AMPLITUDE: 11.7 mv
TZAT-0001FASTVT: 1
TZAT-0004SLOWVT: 8
TZAT-0013SLOWVT: 3
TZAT-0018FASTVT: NEGATIVE
TZAT-0018SLOWVT: NEGATIVE
TZAT-0019FASTVT: 7.5 V
TZAT-0019SLOWVT: 7.5 V
TZAT-0020FASTVT: 1 ms
TZON-0003SLOWVT: 330 ms
TZON-0004FASTVT: 24
TZON-0004SLOWVT: 35
TZON-0005FASTVT: 6
TZON-0010FASTVT: 80 ms
TZON-0010SLOWVT: 80 ms
TZST-0001FASTVT: 4
TZST-0001SLOWVT: 3
TZST-0001SLOWVT: 5
TZST-0003FASTVT: 25 J
TZST-0003SLOWVT: 25 J
TZST-0003SLOWVT: 36 J
VENTRICULAR PACING ICD: 99 pct

## 2011-09-03 NOTE — Patient Instructions (Signed)
Your physician wants you to follow-up in: 12 months with Dr. Taylor. You will receive a reminder letter in the mail two months in advance. If you don't receive a letter, please call our office to schedule the follow-up appointment.    

## 2011-09-04 ENCOUNTER — Encounter: Payer: Self-pay | Admitting: Internal Medicine

## 2011-09-04 NOTE — Assessment & Plan Note (Signed)
Her CHF symptoms are class 1. Her EF has normalized. She will continue her current meds. At [facility], if her EF remains good, will plan BiV PPM rather than ICD.

## 2011-09-04 NOTE — Assessment & Plan Note (Signed)
Her blood pressure is well controlled. She will continue her current meds and maintain a low sodium diet. 

## 2011-09-04 NOTE — Progress Notes (Signed)
HPI [name] returns today for followup. She is a pleasant 56 yo woman with a h/o DCM, LBBB, s/p BiV ICD implant. The patient denies chest pain and sob. Her LV function normalized with BiV pacing. She now has class 1 symptoms. No syncope. No ICD shocks. Allergies  Allergen Reactions  . Niacin And Related      Current Outpatient Prescriptions  Medication Sig Dispense Refill  . aspirin 325 MG EC tablet Take 81 mg by mouth daily.       . carvedilol (COREG CR) 20 MG 24 hr capsule Take 80 mg by mouth at bedtime. 1 TAB QHS      . Cholecalciferol (VITAMIN D) 2000 UNITS CAPS Take 1 capsule by mouth daily. 1 TAB QD       . Choline Fenofibrate (TRILIPIX) 135 MG capsule Take 135 mg by mouth daily. 1 TAB QD      . estrogen-methyltestosterone (ESTRATEST) 1.25-2.5 MG per tablet Take 1 tablet by mouth at bedtime. 1 TAB QHS       . imipramine (TOFRANIL) 25 MG tablet Take 25 mg by mouth at bedtime. 2 TABS QHS       . Liraglutide (VICTOZA) 18 MG/3ML SOLN Inject 1.8 mg into the skin daily. 1.2 MG/INJECTABLE 1 TIME DAILY      . metFORMIN (GLUCOPHAGE) 1000 MG tablet Take 1,000 mg by mouth 2 (two) times daily with a meal.        . montelukast (SINGULAIR) 10 MG tablet Take 10 mg by mouth at bedtime.        . Omega-3 Fatty Acids (FISH OIL) 1200 MG CAPS Take 1 capsule by mouth daily. 1 CAP QD       . rizatriptan (MAXALT) 10 MG tablet Take 10 mg by mouth as needed. May repeat in 2 hours if needed       . rosuvastatin (CRESTOR) 10 MG tablet Take 40 mg by mouth at bedtime. 1 TAB QHS      . spironolactone (ALDACTONE) 25 MG tablet Take 25 mg by mouth 2 (two) times daily.        Marland Kitchen torsemide (DEMADEX) 20 MG tablet Take 20 mg by mouth every other day. 1 TAB QOD       . valsartan (DIOVAN) 320 MG tablet Take 320 mg by mouth daily.        . verapamil (CALAN-SR) 240 MG CR tablet Take 240 mg by mouth at bedtime. 1 TAB QHS       . vitamin E 400 UNIT capsule Take 400 Units by mouth daily. 3 CAPS QD          Past Medical  History  Diagnosis Date  . Hyperlipidemia   . CHF (congestive heart failure)   . Hypertension   . Diabetes mellitus   . Automatic implantable cardiac defibrillator in situ     ST. JUDE MODEL 7122  . Unspecified sleep apnea     ROS:   All systems reviewed and negative except as noted in the HPI.   Past Surgical History  Procedure Date  . Abdominal hysterectomy   . Neck surgery   . Pacemaker insertion   . Cardiac defibrillator placement   . Knee surgery      Family History  Problem Relation Age of Onset  . Heart attack Father      History   Social History  . Marital Status: Married    Spouse Name: N/A    Number of Children: N/A  . Years of  Education: N/A   Occupational History  . Not on file.   Social History Main Topics  . Smoking status: Former Smoker    Types: Cigarettes    Quit date: 03/09/1975  . Smokeless tobacco: Not on file  . Alcohol Use: Yes  . Drug Use: No  . Sexually Active: Not on file   Other Topics Concern  . 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     BP 126/83  Pulse 86  Ht 5\' 7"  (1.702 m)  Wt 225 lb (102.059 kg)  BMI 35.24 kg/m2  Physical Exam:  Well appearing NAD HEENT: Unremarkable Neck:  No JVD, no thyromegally Lungs:  Clear with no wheezes, rales, or rhonchi. Well healed ICD incision. HEART:  Regular rate rhythm, no murmurs, no rubs, no clicks Abd:  soft, positive bowel sounds, no organomegally, no rebound, no guarding Ext:  2 plus pulses, no edema, no cyanosis, no clubbing Skin:  No rashes no nodules Neuro:  CN II through XII intact, motor grossly intact  DEVICE  Normal device function.  See PaceArt for details.   Assess/Plan:

## 2011-09-04 NOTE — Assessment & Plan Note (Signed)
Her device is working normally. Will recheck in several months. 

## 2011-09-09 ENCOUNTER — Emergency Department (INDEPENDENT_AMBULATORY_CARE_PROVIDER_SITE_OTHER): Payer: PRIVATE HEALTH INSURANCE

## 2011-09-09 ENCOUNTER — Encounter (HOSPITAL_COMMUNITY): Payer: Self-pay | Admitting: *Deleted

## 2011-09-09 ENCOUNTER — Emergency Department (HOSPITAL_COMMUNITY)
Admission: EM | Admit: 2011-09-09 | Discharge: 2011-09-09 | Disposition: A | Discharge status: Home / Self Care | Payer: PRIVATE HEALTH INSURANCE | Source: Home / Self Care | Attending: Emergency Medicine | Admitting: Emergency Medicine

## 2011-09-09 DIAGNOSIS — E1149 Type 2 diabetes mellitus with other diabetic neurological complication: Secondary | ICD-10-CM

## 2011-09-09 DIAGNOSIS — E1140 Type 2 diabetes mellitus with diabetic neuropathy, unspecified: Secondary | ICD-10-CM

## 2011-09-09 DIAGNOSIS — E1142 Type 2 diabetes mellitus with diabetic polyneuropathy: Secondary | ICD-10-CM

## 2011-09-09 LAB — D-DIMER, QUANTITATIVE: D-Dimer, Quant: 0.45 ug/mL-FEU (ref 0.00–0.48)

## 2011-09-09 MED ORDER — TRAMADOL HCL 50 MG PO TABS: 100.0000 mg | ORAL_TABLET | Freq: Three times a day (TID) | ORAL | Status: AC | PRN

## 2011-09-09 NOTE — ED Notes (Signed)
Pt  Reports     r  Arm is  painfull  As  Well  As  Some  Tingling   And  A  Sensation of  Coolness  In  Her  Fingers  Which  Started       yest      -      She  denys  Any   Recent  Known  Injury          She  Is  Sitting  Upright  On the  Exam  Table  Speaking in  Complete  sentances          denys  Any  Chest  Pain or  Any  Shortness of  Breath       She  Reports  The  r  Arm  Feels  Heavy

## 2011-09-09 NOTE — ED Provider Notes (Signed)
Chief Complaint  Patient presents with  . Arm Pain    History of Present Illness:   [name] is a 56 year old female with diabetes, hypertension, and hyperlipidemia who last night noted sudden onset of a sharp, stabbing pain in her mid, right upper arm between the biceps and the triceps laterally. Below this she had tingling and numbness extending down into the fingers. The hand seems somewhat swollen. It felt cool and discolored to her. The entire arm felt weak and numb. She denies any pain in her neck and is able to move her neck through a full range of motion without any pain. She denies any shoulder pain.  Review of Systems:  Other than noted above, the patient denies any of the following symptoms: Systemic:  No fevers, chills, sweats, or aches.  No fatigue or tiredness. Musculoskeletal:  No joint pain, arthritis, bursitis, swelling, back pain, or neck pain. Neurological:  No muscular weakness, paresthesias, headache, or trouble with speech or coordination.  No dizziness.   PMFSH:  Past medical history, family history, social history, meds, and allergies were reviewed.  Physical Exam:   Vital signs:  BP 123/74  Pulse 77  Temp 97.9 F (36.6 C) (Oral)  Resp 16  SpO2 97% Gen:  Alert and oriented times 3.  In no distress. Musculoskeletal: The arm and hand appear normal. There is no swelling, no discoloration, the fingers are warm and pink with good capillary refill, and radial pulses were full. All joints have full range of motion with no pain. There is minimal pain to palpation in the upper arm and no swelling. No axillary adenopathy. Otherwise, all joints had a full a ROM with no swelling, bruising or deformity.  No edema, pulses full. Extremities were warm and pink.  Capillary refill was brisk.  Skin:  Clear, warm and dry.  No rash. Neuro:  Alert and oriented times 3.  Muscle strength in the right hand was diminished compared to the left.  Sensation in the right hand was diminished compared  to the left.   Radiology:  Dg Humerus Right  09/09/2011  *RADIOLOGY REPORT*  Clinical Data: Right arm pain.  RIGHT HUMERUS - 2+ VIEW  Comparison: None.  Findings: No acute bony abnormality.  Specifically, no fracture, subluxation, or dislocation.  Soft tissues are intact.  Right shoulder joint spaces are maintained.  IMPRESSION: No acute bony abnormality.  Original Report Authenticated By: Cyndie Chime, M.D.    Assessment:  The encounter diagnosis was Diabetic neuropathy. I do not see any sign of an arterial or venous occlusion. Her symptoms are most consistent with a neuropathy, most likely diabetic. I have asked that she followup with a neurologist as soon as possible.  Plan:   1.  The following meds were prescribed:   New Prescriptions   TRAMADOL (ULTRAM) 50 MG TABLET    Take 2 tablets (100 mg total) by mouth every 8 (eight) hours as needed for pain.   2.  The patient was instructed in symptomatic care, including rest and activity, elevation, application of ice and compression.  Appropriate handouts were given. 3.  The patient was told to return if becoming worse in any way, if no better in 3 or 4 days, and given some red flag symptoms that would indicate earlier return.   4.  The patient was told to follow up with Dr. Porfirio Mylar Dohmeier as soon as possible.   Reuben Likes, MD 09/09/11 [phone]

## 2011-09-23 ENCOUNTER — Telehealth (HOSPITAL_COMMUNITY): Payer: Self-pay | Admitting: *Deleted

## 2011-09-23 NOTE — ED Notes (Signed)
Pt. called on VM 7/17 @ 1543 and said she can't make her appointment with [facility] until we do a referral. I faxed pt.'s note with referral request through [facility] on 7/18 @ 1558.   Vassie Moselle 09/23/2011

## 2011-10-06 ENCOUNTER — Telehealth (HOSPITAL_COMMUNITY): Payer: Self-pay | Admitting: *Deleted

## 2011-10-06 NOTE — ED Notes (Signed)
7/30 Fax received from [facility].  Appointment scheduled with Dr. Marjory Lies on  8/20 @ 0800.  Dr. Lorenz Coaster notified referral completed. Vassie Moselle 10/06/2011

## 2011-12-06 ENCOUNTER — Encounter: Payer: Self-pay | Admitting: *Deleted

## 2011-12-06 ENCOUNTER — Encounter: Payer: Self-pay | Admitting: Internal Medicine

## 2011-12-06 ENCOUNTER — Ambulatory Visit (INDEPENDENT_AMBULATORY_CARE_PROVIDER_SITE_OTHER): Payer: PRIVATE HEALTH INSURANCE | Admitting: *Deleted

## 2011-12-06 DIAGNOSIS — I428 Other cardiomyopathies: Secondary | ICD-10-CM

## 2011-12-06 LAB — REMOTE ICD DEVICE
AL IMPEDENCE ICD: 400 Ohm
ATRIAL PACING ICD: 1 pct
BAMS-0001: 180 {beats}/min
BAMS-0003: 70 {beats}/min
HV IMPEDENCE: 75 Ohm
LV LEAD IMPEDENCE ICD: 480 Ohm
RV LEAD IMPEDENCE ICD: 450 Ohm
TZAT-0001FASTVT: 1
TZAT-0001SLOWVT: 1
TZAT-0004FASTVT: 8
TZAT-0004SLOWVT: 8
TZAT-0012FASTVT: 200 ms
TZAT-0012SLOWVT: 200 ms
TZAT-0013FASTVT: 1
TZAT-0019FASTVT: 7.5 V
TZAT-0020FASTVT: 1 ms
TZON-0004FASTVT: 24
TZON-0005FASTVT: 6
TZON-0010SLOWVT: 80 ms
TZST-0001FASTVT: 3
TZST-0001FASTVT: 4
TZST-0001FASTVT: 5
TZST-0001SLOWVT: 3
TZST-0001SLOWVT: 5
TZST-0003FASTVT: 25 J
TZST-0003FASTVT: 36 J
TZST-0003FASTVT: 36 J
TZST-0003SLOWVT: 15 J
TZST-0003SLOWVT: 36 J

## 2011-12-07 NOTE — Progress Notes (Signed)
Remote defib check  

## 2011-12-15 ENCOUNTER — Encounter: Payer: Self-pay | Admitting: *Deleted

## 2012-02-06 ENCOUNTER — Emergency Department (HOSPITAL_COMMUNITY)
Admission: EM | Admit: 2012-02-06 | Discharge: 2012-02-06 | Disposition: A | Discharge status: Home / Self Care | Payer: PRIVATE HEALTH INSURANCE | Source: Home / Self Care | Attending: Family Medicine | Admitting: Family Medicine

## 2012-02-06 ENCOUNTER — Encounter (HOSPITAL_COMMUNITY): Payer: Self-pay

## 2012-02-06 DIAGNOSIS — J069 Acute upper respiratory infection, unspecified: Secondary | ICD-10-CM

## 2012-02-06 MED ORDER — AZITHROMYCIN 250 MG PO TABS: ORAL_TABLET | ORAL | Status: DC

## 2012-02-06 NOTE — ED Notes (Signed)
C/o sore throat and sinus problems x 1 day

## 2012-02-06 NOTE — ED Provider Notes (Signed)
History     CSN: [id number]  Arrival date & time 02/06/12  4098   First MD Initiated Contact with Patient 02/06/12 0935      Chief Complaint  Patient presents with  . Sore Throat    (Consider location/radiation/quality/duration/timing/severity/associated sxs/prior treatment) HPI Comments: 56 year old female with history of CHF, hypertension and diabetes. Here complaining of nasal congestion clear rhinorrhea and sore throat since 5 PM yesterday. Symptoms not associated with fever or chills. No changes in appetite. Denies headache. No chest pain or difficulty breathing. No productive cough. No abdominal pain, nausea or vomiting. Patient concerned as she has a history of chronic rhinitis and recurrent sinusitis. She reports that usually her sinusitis progresses to become bronchitis and pneumonia. She is soon to travel out of state for one week and wanted to be checked before her trip.   Past Medical History  Diagnosis Date  . Hyperlipidemia   . CHF (congestive heart failure)   . Hypertension   . Diabetes mellitus   . Automatic implantable cardiac defibrillator in situ     ST. JUDE MODEL 7122  . Unspecified sleep apnea     Past Surgical History  Procedure Date  . Abdominal hysterectomy   . Neck surgery   . Pacemaker insertion   . Cardiac defibrillator placement   . Knee surgery     Family History  Problem Relation Age of Onset  . Heart attack Father     History  Substance Use Topics  . Smoking status: Former Smoker    Types: Cigarettes    Quit date: 03/09/1975  . Smokeless tobacco: Not on file  . Alcohol Use: Yes    OB History    Grav Para Term Preterm Abortions TAB SAB Ect Mult Living                  Review of Systems  Constitutional: Negative for fever, chills, activity change, appetite change and fatigue.  HENT: Positive for congestion, sore throat, rhinorrhea and sneezing. Negative for trouble swallowing and voice change.   Eyes: Negative for discharge  and redness.  Respiratory: Negative for cough, shortness of breath and wheezing.   Cardiovascular: Negative for chest pain and leg swelling.  Gastrointestinal: Negative for nausea, vomiting and abdominal pain.  Musculoskeletal: Negative for myalgias and arthralgias.  Skin: Negative for rash.  Neurological: Negative for dizziness and headaches.  All other systems reviewed and are negative.    Allergies  Niacin and related  Home Medications   Current Outpatient Rx  Name  Route  Sig  Dispense  Refill  . ASPIRIN 325 MG PO TBEC   Oral   Take 81 mg by mouth daily.          Marland Kitchen CARVEDILOL PHOSPHATE ER 20 MG PO CP24   Oral   Take 80 mg by mouth at bedtime. 1 TAB QHS         . VITAMIN D 2000 UNITS PO CAPS   Oral   Take 1 capsule by mouth daily. 1 TAB QD          . CHOLINE FENOFIBRATE 135 MG PO CPDR   Oral   Take 135 mg by mouth daily. 1 TAB QD         . EST ESTROGENS-METHYLTEST 1.25-2.5 MG PO TABS   Oral   Take 1 tablet by mouth at bedtime. 1 TAB QHS          . IMIPRAMINE HCL 25 MG PO TABS   Oral  Take 25 mg by mouth at bedtime. 2 TABS QHS          . LIRAGLUTIDE 18 MG/3ML [place] SOLN   Subcutaneous   Inject 1.8 mg into the skin daily. 1.2 MG/INJECTABLE 1 TIME DAILY         . METFORMIN HCL 1000 MG PO TABS   Oral   Take 1,000 mg by mouth 2 (two) times daily with a meal.           . MONTELUKAST SODIUM 10 MG PO TABS   Oral   Take 10 mg by mouth at bedtime.           Marland Kitchen FISH OIL 1200 MG PO CAPS   Oral   Take 1 capsule by mouth daily. 1 CAP QD          . RIZATRIPTAN BENZOATE 10 MG PO TABS   Oral   Take 10 mg by mouth as needed. May repeat in 2 hours if needed          . ROSUVASTATIN CALCIUM 10 MG PO TABS   Oral   Take 40 mg by mouth at bedtime. 1 TAB QHS         . SPIRONOLACTONE 25 MG PO TABS   Oral   Take 25 mg by mouth 2 (two) times daily.           . TORSEMIDE 20 MG PO TABS   Oral   Take 20 mg by mouth every other day. 1 TAB QOD            . VALSARTAN 320 MG PO TABS   Oral   Take 320 mg by mouth daily.           Marland Kitchen VERAPAMIL HCL 240 MG PO TBCR   Oral   Take 240 mg by mouth at bedtime. 1 TAB QHS          . VITAMIN E 400 UNITS PO CAPS   Oral   Take 400 Units by mouth daily. 3 CAPS QD            BP 121/66  Pulse 77  Temp 97.7 F (36.5 C) (Oral)  Resp 17  SpO2 97%  Physical Exam  Nursing note and vitals reviewed. Constitutional: She is oriented to person, place, and time. She appears well-developed and well-nourished. No distress.  HENT:  Head: Normocephalic and atraumatic.  Right Ear: External ear normal.  Left Ear: External ear normal.       Nasal Congestion with erythema and swelling of nasal turbinates, clear rhinorrhea. mild pharyngeal erythema no exudates. No uvula deviation. No trismus. TM's normal.  Eyes: Conjunctivae normal are normal. Pupils are equal, round, and reactive to light. Right eye exhibits no discharge. Left eye exhibits no discharge.  Neck: Neck supple. No JVD present. No thyromegaly present.  Cardiovascular: Normal rate and regular rhythm.  Exam reveals no gallop.        No low extremity edema.  Pulmonary/Chest: Effort normal and breath sounds normal. No respiratory distress. She has no wheezes. She has no rales. She exhibits no tenderness.  Lymphadenopathy:    She has no cervical adenopathy.  Neurological: She is alert and oriented to person, place, and time.  Skin: No rash noted. She is not diaphoretic.    ED Course  Procedures (including critical care time)   Labs Reviewed  POCT RAPID STREP A ([facility] CARE ONLY)   No results found.   1. Upper respiratory infection  MDM  Negative rapid strep. Impress upper respiratory infection of viral etiology. Very early in evolution with symptoms for less than 24 hours. No fever or systemic symptoms. Recommended symptomatic treatment. Provided a hold prescription for azithromycin in case persistent or worsening symptoms  after 5-7 days since the beginning of symptoms was just traveling. Supportive care and red flags that should prompt her return to medical attention discussed with patient and provided in writing.   Sharin Grave, MD 02/08/12 1057

## 2012-03-13 ENCOUNTER — Ambulatory Visit (INDEPENDENT_AMBULATORY_CARE_PROVIDER_SITE_OTHER): Payer: PRIVATE HEALTH INSURANCE | Admitting: *Deleted

## 2012-03-13 ENCOUNTER — Encounter: Payer: Self-pay | Admitting: Internal Medicine

## 2012-03-13 DIAGNOSIS — Z95810 Presence of automatic (implantable) cardiac defibrillator: Secondary | ICD-10-CM

## 2012-03-13 DIAGNOSIS — I428 Other cardiomyopathies: Secondary | ICD-10-CM

## 2012-03-15 LAB — REMOTE ICD DEVICE
AL AMPLITUDE: 4.2 mv
ATRIAL PACING ICD: 1.6 pct
BAMS-0003: 70 {beats}/min
HV IMPEDENCE: 77 Ohm
TZAT-0001FASTVT: 1
TZAT-0001SLOWVT: 1
TZAT-0012FASTVT: 200 ms
TZAT-0019SLOWVT: 7.5 V
TZAT-0020FASTVT: 1 ms
TZAT-0020SLOWVT: 1 ms
TZON-0004SLOWVT: 35
TZON-0005FASTVT: 6
TZON-0005SLOWVT: 6
TZON-0010SLOWVT: 80 ms
TZST-0001FASTVT: 2
TZST-0001FASTVT: 5
TZST-0001SLOWVT: 4
TZST-0003FASTVT: 25 J
TZST-0003FASTVT: 36 J
TZST-0003SLOWVT: 15 J

## 2012-03-21 ENCOUNTER — Encounter: Payer: Self-pay | Admitting: *Deleted

## 2012-06-12 ENCOUNTER — Ambulatory Visit (INDEPENDENT_AMBULATORY_CARE_PROVIDER_SITE_OTHER): Payer: PRIVATE HEALTH INSURANCE | Admitting: *Deleted

## 2012-06-12 ENCOUNTER — Other Ambulatory Visit: Payer: Self-pay | Admitting: Internal Medicine

## 2012-06-12 DIAGNOSIS — Z95810 Presence of automatic (implantable) cardiac defibrillator: Secondary | ICD-10-CM

## 2012-06-12 DIAGNOSIS — I5022 Chronic systolic (congestive) heart failure: Secondary | ICD-10-CM

## 2012-06-13 LAB — REMOTE ICD DEVICE
BATTERY VOLTAGE: 2.56 V
DEVICE MODEL ICD: 540466
HV IMPEDENCE: 73 Ohm
LV LEAD IMPEDENCE ICD: 460 Ohm
RV LEAD AMPLITUDE: 11.7 mv
RV LEAD IMPEDENCE ICD: 430 Ohm
TZAT-0001SLOWVT: 1
TZAT-0004SLOWVT: 8
TZAT-0013FASTVT: 1
TZAT-0018FASTVT: NEGATIVE
TZAT-0019FASTVT: 7.5 V
TZAT-0019SLOWVT: 7.5 V
TZAT-0020FASTVT: 1 ms
TZON-0003FASTVT: 300 ms
TZON-0004FASTVT: 24
TZON-0004SLOWVT: 35
TZON-0005FASTVT: 6
TZON-0010FASTVT: 80 ms
TZON-0010SLOWVT: 80 ms
TZST-0001FASTVT: 2
TZST-0001FASTVT: 3
TZST-0001FASTVT: 5
TZST-0001SLOWVT: 3
TZST-0001SLOWVT: 4
TZST-0003FASTVT: 36 J
TZST-0003SLOWVT: 15 J
TZST-0003SLOWVT: 36 J
VENTRICULAR PACING ICD: 100 pct

## 2012-07-04 ENCOUNTER — Encounter: Payer: Self-pay | Admitting: *Deleted

## 2012-07-06 ENCOUNTER — Encounter: Payer: Self-pay | Admitting: Internal Medicine

## 2012-08-01 ENCOUNTER — Other Ambulatory Visit: Payer: Self-pay | Admitting: *Deleted

## 2012-08-01 MED ORDER — ROSUVASTATIN CALCIUM 10 MG PO TABS: 40.0000 mg | ORAL_TABLET | Freq: Every day | ORAL | Status: DC

## 2012-08-01 MED ORDER — TORSEMIDE 20 MG PO TABS: 20.0000 mg | ORAL_TABLET | ORAL | Status: DC

## 2012-08-02 NOTE — Telephone Encounter (Signed)
[name] [phone] called-has a question about fax just received on Torsemide!

## 2012-08-02 NOTE — Telephone Encounter (Signed)
BARBARA PLEASE CALL THE PHARMACY TO ANSWER QUESTION CONCERNING PATIENTS TORSEMIDE RX.  THANKS!

## 2012-08-11 NOTE — Telephone Encounter (Signed)
Spoke with Baldo Ash at pharmacy.  They wanted to confirm QOD.Marland Kitchenyes

## 2012-08-26 ENCOUNTER — Encounter: Payer: Self-pay | Admitting: Pharmacist Clinician (PhC)/ Clinical Pharmacy Specialist

## 2012-08-28 ENCOUNTER — Ambulatory Visit (INDEPENDENT_AMBULATORY_CARE_PROVIDER_SITE_OTHER): Payer: PRIVATE HEALTH INSURANCE | Admitting: Cardiovascular Disease

## 2012-08-28 ENCOUNTER — Encounter: Payer: Self-pay | Admitting: Cardiovascular Disease

## 2012-08-28 VITALS — BP 110/70 | HR 72 | Ht 67.0 in | Wt 224.5 lb

## 2012-08-28 DIAGNOSIS — E119 Type 2 diabetes mellitus without complications: Secondary | ICD-10-CM

## 2012-08-28 DIAGNOSIS — I10 Essential (primary) hypertension: Secondary | ICD-10-CM

## 2012-08-28 DIAGNOSIS — I5189 Other ill-defined heart diseases: Secondary | ICD-10-CM

## 2012-08-28 DIAGNOSIS — I519 Heart disease, unspecified: Secondary | ICD-10-CM

## 2012-08-28 DIAGNOSIS — G4730 Sleep apnea, unspecified: Secondary | ICD-10-CM

## 2012-08-28 DIAGNOSIS — I428 Other cardiomyopathies: Secondary | ICD-10-CM

## 2012-08-28 DIAGNOSIS — I119 Hypertensive heart disease without heart failure: Secondary | ICD-10-CM

## 2012-08-28 NOTE — Patient Instructions (Signed)
Your physician recommends that you schedule a follow-up appointment in: 6 months.  No changes has been made today in your therapy. 

## 2012-08-30 ENCOUNTER — Encounter: Payer: Self-pay | Admitting: Cardiovascular Disease

## 2012-08-30 DIAGNOSIS — I5189 Other ill-defined heart diseases: Secondary | ICD-10-CM | POA: Insufficient documentation

## 2012-08-30 DIAGNOSIS — I428 Other cardiomyopathies: Secondary | ICD-10-CM | POA: Insufficient documentation

## 2012-08-30 NOTE — Progress Notes (Signed)
Patient ID: [name], female   DOB: [birth year], 56 y.o.   MRN: [id number]     HPI: [name], is a 57 y.o. female who has a history of a nonischemic cardiomyopathy. Initial ejection fraction was in the range of 25-30%. She is status post ICD implantation by Dr. Ladona Ridgel. She also has a history of obstructive sleep apnea on CPAP therapy. Her LV function has essentially normalized on her last echo in April 2003 being greater than 55%. 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 She is grade 1 diastolic dysfunction.  Over the past 6 months, she has continued to do well. She denies any significant chest pain or shortness of breath or palpitations. She presents for six-month cardiology evaluation.  Past Medical History  Diagnosis Date  . Hyperlipidemia   . CHF (congestive heart failure)   . Hypertension 06/25/2011    Renal dopplers - superior mesenteric artery >50% diameter reduction; R renal artery - normal patency; L proximal renal artery 1-59% reduction (lower end of scale); both kidneys normal in size/symmetry with normal cortex and medulla  . Diabetes mellitus   . Automatic implantable cardiac defibrillator in situ     ST. JUDE MODEL 7122  . Unspecified sleep apnea   . Leg pain 12/09/2010    doppler of R femoral artery - no evidence of dissection, AV fistula, pseudoaneurysm or other vascular abnormalities  . Sleep apnea     on CPAP - AHI during sleep study was 44  . Obesity     Past Surgical History  Procedure Laterality Date  . Abdominal hysterectomy    . Neck surgery    . Pacemaker insertion    . Cardiac defibrillator placement      ICD by Dr. Ladona Ridgel  . Knee surgery    . Doppler echocardiography  06/25/2011    EF >55%; moderate concentric LV hypertrophy; LA mildly dilated, mild mitral annular calcification;   . Cardiovascular stress test  08/28/2010    R/P MV -  pattern of normal perfusion in all regions, no scintigraphic evidence of inducible myocardial ischemia; no EKG changes; normal perfusion study; pt did experience chesst pain during strudy, resolved spontaneously  . Cardiac catheterization  07/15/2006    no significant CAD by cardiac cath, confirmed by intravascular [place] of LAD; non-ischemic cardiomyopathy prob related to uncontrolled hypertension, DM and morbid obesity; moderate pulmonary hypertension; preserved cardiac output and cardiac index    Allergies  Allergen Reactions  . Niacin And Related   . Penicillins     Current Outpatient Prescriptions  Medication Sig Dispense Refill  . aspirin 81 MG tablet Take 81 mg by mouth daily. Takes 2      . carvedilol (COREG) 25 MG tablet Take 25 mg by mouth 2 (two) times daily with a meal.      . Cholecalciferol (VITAMIN D) 2000 UNITS CAPS Take 1 capsule by mouth daily. 1 TAB QD       . Choline Fenofibrate (TRILIPIX) 135 MG capsule Take 135 mg by mouth daily. 1 TAB QD      . estrogen-methyltestosterone (ESTRATEST) 1.25-2.5 MG per tablet Take 1 tablet by mouth at bedtime. 1 TAB QHS       . imipramine (TOFRANIL) 25 MG tablet Take 25 mg by mouth at bedtime. 2 TABS QHS       . insulin aspart protamine- aspart (NOVOLOG MIX 70/30) (70-30) 100 UNIT/ML injection Inject  into the skin. 20 units am 25 units pm      . losartan (COZAAR) 100 MG tablet Take 100 mg by mouth daily.      . metFORMIN (GLUCOPHAGE) 1000 MG tablet Take 1,000 mg by mouth 2 (two) times daily with a meal.        . montelukast (SINGULAIR) 10 MG tablet Take 10 mg by mouth at bedtime.        . pantoprazole (PROTONIX) 40 MG tablet Take 40 mg by mouth daily.      . rizatriptan (MAXALT) 10 MG tablet Take 10 mg by mouth as needed. May repeat in 2 hours if needed       . rosuvastatin (CRESTOR) 10 MG tablet Take 4 tablets (40 mg total) by mouth at bedtime. 1 TAB QHS  90 tablet  1  . spironolactone (ALDACTONE) 25 MG tablet Take 25 mg by mouth 2 (two)  times daily.        Marland Kitchen torsemide (DEMADEX) 20 MG tablet Take 20 mg by mouth daily.      . verapamil (CALAN-SR) 240 MG CR tablet Take 240 mg by mouth at bedtime. 1 TAB QHS       . Omega-3 Fatty Acids (FISH OIL) 1200 MG CAPS Take 1 capsule by mouth daily. 1 CAP QD        No current facility-administered medications for this visit.    Socially she is married and has 2 children and 2 grandchildren. There is no tobacco or alcohol use.  ROS no fevers, chills or night sweats. She denies palpitations. She denies chest pain. She denies PND or orthopnea. She denies bleeding. She denies GI symptoms or GU symptoms.  Other system review is negative.  PE BP 110/70  Pulse 72  Ht 5\' 7"  (1.702 m)  Wt 224 lb 8 oz (101.833 kg)  BMI 35.15 kg/m2  General: Alert, oriented, no distress.  Skin: normal turgor, no rashes HEENT: Normocephalic, atraumatic. Pupils round and reactive; sclera anicteric;no lid lag.  Nose without nasal septal hypertrophy Mouth/Parynx benign; Mallinpatti scale 2 Neck: No JVD, no carotid briuts Lungs: clear to ausculatation and percussion; no wheezing or rales Heart: RRR, s1 s2 normal 1/6 systolic murmur. Abdomen: soft, nontender with central adiposity; no hepatosplenomehaly, BS+; abdominal aorta nontender and not dilated by palpation. Pulses 2+ Extremities: no clubbing cyanosis or edema, Homan's sign negative  Neurologic: grossly nonfocal  ECG: Atrial lead sensed, ventricular paced rhythm with 100% ventricular capture    LABS:  BMET    Component Value Date/Time   NA 139 12/22/2007 0627   K 3.6 12/22/2007 0627   CL 100 12/22/2007 0627   CO2 31 12/22/2007 0627   GLUCOSE 142* 12/22/2007 0627   BUN 15 12/22/2007 0627   CREATININE 1.12 12/22/2007 0627   CALCIUM 9.9 12/22/2007 0627   GFRNONAA 51* 12/22/2007 0627   GFRAA  Value: >60        The eGFR has been calculated using the MDRD equation. This calculation has not been validated in all clinical 12/22/2007 0627      Hepatic Function Panel  No results found for this basename: prot, albumin, ast, alt, alkphos, bilitot, bilidir, ibili     CBC    Component Value Date/Time   WBC 6.9 12/13/2007 0827   RBC 4.07 12/13/2007 0827   HGB 12.3 12/13/2007 0827   HCT 35.5* 12/13/2007 0827   PLT 323 12/13/2007 0827   MCV 87.3 12/13/2007 0827   MCHC 34.6 12/13/2007 0827  RDW 13.4 12/13/2007 0827   LYMPHSABS 2.4 06/07/2007 1456   MONOABS 0.4 12/13/2007 0827   EOSABS 0.2 12/13/2007 0827   BASOSABS 0.1 12/13/2007 0827     BNP No results found for this basename: probnp    Lipid Panel  No results found for this basename: chol, trig, hdl, cholhdl, vldl, ldlcalc     RADIOLOGY: No results found.    ASSESSMENT AND PLAN: Ms. Vasques continues to do well and is currently tolerating the medication adjustments which were made at her last office visit. Due to insurance reasons we changed her from valsartan 320 mg to losartan 100 mg and changed her Coreg CR to carvedilol 25 twice a day. Her last echo Doppler study in April 2013 showed an ejection fraction greater than 55%. She did have moderate concentric LVH. She had mild aortic valve sclerosis with mild annular calcification. Clinically, she is well compensated. She is using her CPAP 100% of the time for obstructive sleep apnea. She denies residual daytime sleepiness. She tells me she'll be having laboratory work done by Dr. Oneta Rack and I will ask  that these be forwarded to me for my evaluation. I will see her in 6 months for further evaluation.     Lennette Bihari, MD, [facility]  08/30/2012 11:52 PM

## 2012-11-27 ENCOUNTER — Encounter: Payer: Self-pay | Admitting: Internal Medicine

## 2012-11-27 ENCOUNTER — Ambulatory Visit (INDEPENDENT_AMBULATORY_CARE_PROVIDER_SITE_OTHER): Payer: PRIVATE HEALTH INSURANCE | Admitting: Internal Medicine

## 2012-11-27 VITALS — BP 122/78 | HR 85 | Ht 67.0 in | Wt 224.1 lb

## 2012-11-27 DIAGNOSIS — I428 Other cardiomyopathies: Secondary | ICD-10-CM

## 2012-11-27 DIAGNOSIS — I5022 Chronic systolic (congestive) heart failure: Secondary | ICD-10-CM

## 2012-11-27 DIAGNOSIS — Z95810 Presence of automatic (implantable) cardiac defibrillator: Secondary | ICD-10-CM

## 2012-11-27 LAB — ICD DEVICE OBSERVATION
AL AMPLITUDE: 3.8 mv
BATTERY VOLTAGE: 2.5718 V
DEVICE MODEL ICD: 540466
FVT: 0
HV IMPEDENCE: 79 Ohm
RV LEAD AMPLITUDE: 11.7 mv
RV LEAD IMPEDENCE ICD: 462.5 Ohm
TOT-0007: 1
TOT-0008: 0
TOT-0009: 1
TOT-0010: 28
TZAT-0012SLOWVT: 200 ms
TZAT-0013FASTVT: 1
TZAT-0013SLOWVT: 3
TZAT-0018FASTVT: NEGATIVE
TZAT-0018SLOWVT: NEGATIVE
TZAT-0019FASTVT: 7.5 V
TZON-0003FASTVT: 300 ms
TZON-0003SLOWVT: 330 ms
TZON-0004FASTVT: 24
TZON-0005FASTVT: 6
TZON-0005SLOWVT: 6
TZON-0010FASTVT: 80 ms
TZST-0001FASTVT: 2
TZST-0001FASTVT: 4
TZST-0001SLOWVT: 3
TZST-0001SLOWVT: 5
TZST-0003FASTVT: 36 J
TZST-0003FASTVT: 36 J
TZST-0003FASTVT: 36 J
TZST-0003SLOWVT: 25 J
VENTRICULAR PACING ICD: 99.88 pct
VF: 0

## 2012-11-27 NOTE — Progress Notes (Signed)
HPI [name] returns today for followup. She is a very pleasant 57 year old woman with a nonischemic cardiomyopathy, chronic systolic heart failure, and left bundle branch block, who underwent biventricular ICD implantation and had normalization of her left ventricular function. In the interim she has done well. She denies chest pain, shortness of breath, or peripheral edema. She is bothered today by a migraine headache. Her only complaints include some intermittent tenderness at her ICD insertion site. No syncope, and no ICD shock. No fevers or chills.  Allergies  Allergen Reactions  . Niacin And Related   . Penicillins      Current Outpatient Prescriptions  Medication Sig Dispense Refill  . aspirin 81 MG tablet Take 81 mg by mouth daily. Takes 2      . carvedilol (COREG) 25 MG tablet Take 25 mg by mouth 2 (two) times daily with a meal.      . Cholecalciferol (VITAMIN D) 2000 UNITS CAPS Take 1 capsule by mouth daily. 1 TAB QD       . Choline Fenofibrate (TRILIPIX) 135 MG capsule Take 135 mg by mouth daily. 1 TAB QD      . estrogen-methyltestosterone (ESTRATEST) 1.25-2.5 MG per tablet Take 1 tablet by mouth at bedtime. 1 TAB QHS       . imipramine (TOFRANIL) 25 MG tablet Take 25 mg by mouth at bedtime. 2 TABS QHS       . insulin aspart protamine- aspart (NOVOLOG MIX 70/30) (70-30) 100 UNIT/ML injection Inject into the skin. 20 units am 25 units pm      . losartan (COZAAR) 100 MG tablet Take 100 mg by mouth daily.      . metFORMIN (GLUCOPHAGE) 1000 MG tablet Take 1,000 mg by mouth 2 (two) times daily with a meal.        . montelukast (SINGULAIR) 10 MG tablet Take 10 mg by mouth at bedtime.        . Omega-3 Fatty Acids (FISH OIL) 1200 MG CAPS Take 1 capsule by mouth daily. 1 CAP QD       . pantoprazole (PROTONIX) 40 MG tablet Take 40 mg by mouth daily.      . rizatriptan (MAXALT) 10 MG tablet Take 10 mg by mouth as needed. May repeat in 2 hours if needed       . rosuvastatin (CRESTOR) 10 MG  tablet Take 4 tablets (40 mg total) by mouth at bedtime. 1 TAB QHS  90 tablet  1  . spironolactone (ALDACTONE) 25 MG tablet Take 25 mg by mouth 2 (two) times daily.        Marland Kitchen torsemide (DEMADEX) 20 MG tablet Take 20 mg by mouth daily.      . verapamil (CALAN-SR) 240 MG CR tablet Take 240 mg by mouth at bedtime. 1 TAB QHS        No current facility-administered medications for this visit.     Past Medical History  Diagnosis Date  . Hyperlipidemia   . CHF (congestive heart failure)   . Hypertension 06/25/2011    Renal dopplers - superior mesenteric artery >50% diameter reduction; R renal artery - normal patency; L proximal renal artery 1-59% reduction (lower end of scale); both kidneys normal in size/symmetry with normal cortex and medulla  . Diabetes mellitus   . Automatic implantable cardiac defibrillator in situ     ST. JUDE MODEL 7122  . Unspecified sleep apnea   . Leg pain 12/09/2010    doppler of R femoral artery -  no evidence of dissection, AV fistula, pseudoaneurysm or other vascular abnormalities  . Sleep apnea     on CPAP - AHI during sleep study was 44  . Obesity     ROS:   All systems reviewed and negative except as noted in the HPI.   Past Surgical History  Procedure Laterality Date  . Abdominal hysterectomy    . Neck surgery    . Pacemaker insertion    . Cardiac defibrillator placement      ICD by Dr. Ladona Ridgel  . Knee surgery    . Doppler echocardiography  06/25/2011    EF >55%; moderate concentric LV hypertrophy; LA mildly dilated, mild mitral annular calcification;   . Cardiovascular stress test  08/28/2010    R/P MV - pattern of normal perfusion in all regions, no scintigraphic evidence of inducible myocardial ischemia; no EKG changes; normal perfusion study; pt did experience chesst pain during strudy, resolved spontaneously  . Cardiac catheterization  07/15/2006    no significant CAD by cardiac cath, confirmed by intravascular [place] of LAD; non-ischemic cardiomyopathy  prob related to uncontrolled hypertension, DM and morbid obesity; moderate pulmonary hypertension; preserved cardiac output and cardiac index     Family History  Problem Relation Age of Onset  . Heart attack Father      History   Social History  . Marital Status: Married    Spouse Name: N/A    Number of Children: N/A  . Years of Education: N/A   Occupational History  . Not on file.   Social History Main Topics  . Smoking status: Former Smoker    Types: Cigarettes    Quit date: 03/09/1975  . Smokeless tobacco: Not on file  . Alcohol Use: Yes  . Drug Use: No  . Sexual Activity: Not on file   Other Topics Concern  . 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     BP 122/78  Pulse 85  Ht 5\' 7"  (1.702 m)  Wt 224 lb 1.3 oz (101.642 kg)  BMI 35.09 kg/m2  Physical Exam:  Well appearing middle-age woman, NAD HEENT: Unremarkable Neck:  6 cm JVD, no thyromegally Back:  No CVA tenderness Lungs:  Clear with no wheezes, rales, or rhonchi. well-healed ICD incision HEART:  Regular rate rhythm, grade 1/6 systolic murmur, no rubs, no clicks Abd:  soft, obese, positive bowel sounds, no organomegally, no rebound, no guarding Ext:  2 plus pulses, no edema, no cyanosis, no clubbing Skin:  No rashes no nodules Neuro:  CN II through XII intact, motor grossly intact  DEVICE  Normal device function.  See PaceArt for details.   Assess/Plan:

## 2012-11-27 NOTE — Assessment & Plan Note (Signed)
By echo a year ago, her left ventricular function had normalized. She will continue her current medical therapy, maintain a low-sodium diet, and increase her physical activity.

## 2012-11-27 NOTE — Patient Instructions (Addendum)
Your physician recommends that you continue on your current medications as directed. Please refer to the Current Medication list given to you today.  Remote monitoring is used to monitor your Pacemaker of ICD from home. This monitoring reduces the number of office visits required to check your device to one time per year. It allows [place] to keep an eye on the functioning of your device to ensure it is working properly. You are scheduled for a device check from home on 02/26/2013 Merlin. You may send your transmission at any time that day. If you have a wireless device, the transmission will be sent automatically. After your physician reviews your transmission, you will receive a postcard with your next transmission date.  Your physician wants you to follow-up in: 1 year with Dr. Court Joy will receive a reminder letter in the mail two months in advance. If you don't receive a letter, please call our office to schedule the follow-up appointment.

## 2012-11-27 NOTE — Progress Notes (Signed)
CRT-D device check in office. Thresholds and sensing consistent with previous device measurements. Lead impedance trends stable over time. No mode switch episodes recorded. No ventricular arrhythmia episodes recorded. Patient bi-ventricularly pacing >100% of the time. Device programmed with appropriate safety margins. Heart failure diagnostics reviewed and trends are stable for patient. Audible/vibratory alerts demonstrated for patient. No changes made this session. Estimated longevity 3.4 years.  Patient enrolled in remote follow up. Plan to check device remotely in 3 months and see in office in 6 months. Patient education completed including shock plan.  Merlin 02/26/13.

## 2012-11-27 NOTE — Assessment & Plan Note (Signed)
Her St. Jude biventricular ICD is working normally. We'll plan to recheck in several months. 

## 2012-12-07 ENCOUNTER — Emergency Department (HOSPITAL_COMMUNITY)
Admission: EM | Admit: 2012-12-07 | Discharge: 2012-12-07 | Disposition: A | Discharge status: Home / Self Care | Payer: PRIVATE HEALTH INSURANCE | Attending: Emergency Medicine | Admitting: Emergency Medicine

## 2012-12-07 ENCOUNTER — Encounter (HOSPITAL_COMMUNITY): Payer: Self-pay | Admitting: *Deleted

## 2012-12-07 DIAGNOSIS — T782XXA Anaphylactic shock, unspecified, initial encounter: Secondary | ICD-10-CM

## 2012-12-07 DIAGNOSIS — Z880 Allergy status to penicillin: Secondary | ICD-10-CM | POA: Insufficient documentation

## 2012-12-07 DIAGNOSIS — Y9389 Activity, other specified: Secondary | ICD-10-CM | POA: Insufficient documentation

## 2012-12-07 DIAGNOSIS — T887XXA Unspecified adverse effect of drug or medicament, initial encounter: Secondary | ICD-10-CM | POA: Insufficient documentation

## 2012-12-07 DIAGNOSIS — Z7982 Long term (current) use of aspirin: Secondary | ICD-10-CM | POA: Insufficient documentation

## 2012-12-07 DIAGNOSIS — L509 Urticaria, unspecified: Secondary | ICD-10-CM

## 2012-12-07 DIAGNOSIS — Z794 Long term (current) use of insulin: Secondary | ICD-10-CM | POA: Insufficient documentation

## 2012-12-07 DIAGNOSIS — Y929 Unspecified place or not applicable: Secondary | ICD-10-CM | POA: Insufficient documentation

## 2012-12-07 DIAGNOSIS — T7809XA Anaphylactic reaction due to other food products, initial encounter: Secondary | ICD-10-CM | POA: Insufficient documentation

## 2012-12-07 DIAGNOSIS — E669 Obesity, unspecified: Secondary | ICD-10-CM | POA: Insufficient documentation

## 2012-12-07 DIAGNOSIS — Z79899 Other long term (current) drug therapy: Secondary | ICD-10-CM | POA: Insufficient documentation

## 2012-12-07 DIAGNOSIS — E785 Hyperlipidemia, unspecified: Secondary | ICD-10-CM | POA: Insufficient documentation

## 2012-12-07 DIAGNOSIS — Z95810 Presence of automatic (implantable) cardiac defibrillator: Secondary | ICD-10-CM | POA: Insufficient documentation

## 2012-12-07 DIAGNOSIS — E119 Type 2 diabetes mellitus without complications: Secondary | ICD-10-CM | POA: Insufficient documentation

## 2012-12-07 DIAGNOSIS — Z87891 Personal history of nicotine dependence: Secondary | ICD-10-CM | POA: Insufficient documentation

## 2012-12-07 DIAGNOSIS — I10 Essential (primary) hypertension: Secondary | ICD-10-CM | POA: Insufficient documentation

## 2012-12-07 DIAGNOSIS — T628X1A Toxic effect of other specified noxious substances eaten as food, accidental (unintentional), initial encounter: Secondary | ICD-10-CM | POA: Insufficient documentation

## 2012-12-07 DIAGNOSIS — I509 Heart failure, unspecified: Secondary | ICD-10-CM | POA: Insufficient documentation

## 2012-12-07 MED ORDER — FAMOTIDINE IN NACL 20-0.9 MG/50ML-% IV SOLN
20.0000 mg | Freq: Once | INTRAVENOUS | Status: AC
  Administered 2012-12-07: 20 mg via INTRAVENOUS
  Filled 2012-12-07: qty 50

## 2012-12-07 MED ORDER — HYDROXYZINE HCL 25 MG PO TABS: 25.0000 mg | ORAL_TABLET | Freq: Four times a day (QID) | ORAL | Status: DC | PRN

## 2012-12-07 NOTE — ED Notes (Addendum)
Pt now saying she is having a spasm-like feeling in her chest - Pt is paced

## 2012-12-07 NOTE — ED Notes (Signed)
Monday pt felt ears were burning. Today hives all over body - eyes swelling.  Now c/o throat feeling tight.  Airway patent at this time.

## 2012-12-07 NOTE — ED Provider Notes (Signed)
CSN: [id number]     Arrival date & time 12/07/12  1215 History   First MD Initiated Contact with Patient 12/07/12 1245     Chief Complaint  Patient presents with  . Allergic Reaction    HPI  [name] has apparently allergic reaction. She is return from the beach. On Monday night she had some shrimp. On Wednesday she finished a five-day course of Diflucan for vaginal yeast infection. She's had shrimp before. She's had one dose of Diflucan in the past as well. On Tuesday she noticed that she had some redness and burning and itching on the back of her head and scalp. This persisted and by Wednesday was read onto her shoulders. He was itching rash. She was seen in [place] care [place] and given Benadryl. She continues to have progression of symptoms. Her rash is diffuse and itching in urticarial. Seen by her primary care physician today because of the rash. She was given IM Solu-Medrol and Benadryl. She's felt like her tongue was swelling and she was transferred here. This has not been progressive. She is not drooling. She is not stridorous. She does not feel tight in her chest. She has a history of CHF, as dilated cardiomyopathy. ICD/pacemaker.  Past Medical History  Diagnosis Date  . Hyperlipidemia   . CHF (congestive heart failure)   . Hypertension 06/25/2011    Renal dopplers - superior mesenteric artery >50% diameter reduction; R renal artery - normal patency; L proximal renal artery 1-59% reduction (lower end of scale); both kidneys normal in size/symmetry with normal cortex and medulla  . Diabetes mellitus   . Automatic implantable cardiac defibrillator in situ     ST. JUDE MODEL 7122  . Unspecified sleep apnea   . Leg pain 12/09/2010    doppler of R femoral artery - no evidence of dissection, AV fistula, pseudoaneurysm or other vascular abnormalities  . Sleep apnea     on CPAP - AHI during sleep study was 44  . Obesity    Past Surgical History  Procedure Laterality Date  . Abdominal  hysterectomy    . Neck surgery    . Pacemaker insertion    . Cardiac defibrillator placement      ICD by Dr. Ladona Ridgel  . Knee surgery    . Doppler echocardiography  06/25/2011    EF >55%; moderate concentric LV hypertrophy; LA mildly dilated, mild mitral annular calcification;   . Cardiovascular stress test  08/28/2010    R/P MV - pattern of normal perfusion in all regions, no scintigraphic evidence of inducible myocardial ischemia; no EKG changes; normal perfusion study; pt did experience chesst pain during strudy, resolved spontaneously  . Cardiac catheterization  07/15/2006    no significant CAD by cardiac cath, confirmed by intravascular [place] of LAD; non-ischemic cardiomyopathy prob related to uncontrolled hypertension, DM and morbid obesity; moderate pulmonary hypertension; preserved cardiac output and cardiac index   Family History  Problem Relation Age of Onset  . Heart attack Father    History  Substance Use Topics  . Smoking status: Former Smoker    Types: Cigarettes    Quit date: 03/09/1975  . Smokeless tobacco: Not on file  . Alcohol Use: Yes   OB History   Grav Para Term Preterm Abortions TAB SAB Ect Mult Living                 Review of Systems  Constitutional: Negative for fever, chills, diaphoresis, appetite change and fatigue.  HENT: Negative  for sore throat, mouth sores and trouble swallowing.        Tongue swelling  Eyes: Negative for visual disturbance.  Respiratory: Negative for cough, chest tightness, shortness of breath and wheezing.        No dyspnea  Cardiovascular: Negative for chest pain.  Gastrointestinal: Negative for nausea, vomiting, abdominal pain, diarrhea and abdominal distention.  Endocrine: Negative for polydipsia, polyphagia and polyuria.  Genitourinary: Negative for dysuria, frequency and hematuria.  Musculoskeletal: Negative for gait problem.  Skin: Positive for wound. Negative for color change, pallor and rash.       Diffuse urticarial rash.  Started at the neck.  Neurological: Negative for dizziness, syncope, light-headedness and headaches.  Hematological: Does not bruise/bleed easily.  Psychiatric/Behavioral: Negative for behavioral problems and confusion.    Allergies  Niacin and related and Penicillins  Home Medications   Current Outpatient Rx  Name  Route  Sig  Dispense  Refill  . aspirin 81 MG tablet   Oral   Take 81 mg by mouth daily. Takes 2         . carvedilol (COREG) 25 MG tablet   Oral   Take 25 mg by mouth 2 (two) times daily with a meal.         . Cholecalciferol (VITAMIN D) 2000 UNITS CAPS   Oral   Take 1 capsule by mouth daily. 1 TAB QD          . Choline Fenofibrate (TRILIPIX) 135 MG capsule   Oral   Take 135 mg by mouth daily. 1 TAB QD         . estrogen-methyltestosterone (ESTRATEST) 1.25-2.5 MG per tablet   Oral   Take 1 tablet by mouth at bedtime. 1 TAB QHS          . imipramine (TOFRANIL) 25 MG tablet   Oral   Take 25 mg by mouth at bedtime. 2 TABS QHS          . insulin aspart protamine- aspart (NOVOLOG MIX 70/30) (70-30) 100 UNIT/ML injection   Subcutaneous   Inject into the skin. 20 units am 25 units pm         . losartan (COZAAR) 100 MG tablet   Oral   Take 100 mg by mouth daily.         . metFORMIN (GLUCOPHAGE) 1000 MG tablet   Oral   Take 1,000 mg by mouth 2 (two) times daily with a meal.           . montelukast (SINGULAIR) 10 MG tablet   Oral   Take 10 mg by mouth at bedtime.           . pantoprazole (PROTONIX) 40 MG tablet   Oral   Take 40 mg by mouth daily.         . rizatriptan (MAXALT) 10 MG tablet   Oral   Take 10 mg by mouth as needed for migraine. May repeat in 2 hours if needed         . rosuvastatin (CRESTOR) 40 MG tablet   Oral   Take 20 mg by mouth every other day.         . spironolactone (ALDACTONE) 25 MG tablet   Oral   Take 25 mg by mouth 2 (two) times daily.           Marland Kitchen torsemide (DEMADEX) 20 MG tablet   Oral    Take 20 mg by mouth daily.         Marland Kitchen  verapamil (CALAN-SR) 240 MG CR tablet   Oral   Take 240 mg by mouth at bedtime. 1 TAB QHS          . hydrOXYzine (ATARAX/VISTARIL) 25 MG tablet   Oral   Take 1 tablet (25 mg total) by mouth every 6 (six) hours as needed for itching.   12 tablet   0    BP 134/74  Pulse 91  Temp(Src) 98.8 F (37.1 C) (Oral)  Resp 16  Ht 5\' 7"  (1.702 m)  Wt 220 lb (99.791 kg)  BMI 34.45 kg/m2  SpO2 94% Physical Exam  Constitutional: She is oriented to person, place, and time. She appears well-developed and well-nourished. No distress.  Awake alert. She is not in any distress. She is laying nearly supine is not hypertensive. Shows some swelling of the right anterior aspect of the tongue. This is supple. No lip swelling. No posterior pharyngeal swelling. Uvula midline and normal.  HENT:  Head: Normocephalic.  Minimal swelling to the anterior tongue. No noted swelling to the posterior tongue. Dentition intact. No stridor. She is not drooling.  Eyes: Conjunctivae are normal. Pupils are equal, round, and reactive to light. No scleral icterus.  Neck: Normal range of motion. Neck supple. No thyromegaly present.  Cardiovascular: Normal rate and regular rhythm.  Exam reveals no gallop and no friction rub.   No murmur heard. Pulmonary/Chest: Effort normal and breath sounds normal. No respiratory distress. She has no wheezes. She has no rales.  Lungs are clear. Oxygenation in the high 90s. No pronation wheezing.  Abdominal: Soft. Bowel sounds are normal. She exhibits no distension. There is no tenderness. There is no rebound.  Musculoskeletal: Normal range of motion.  Neurological: She is alert and oriented to person, place, and time.  Skin: Skin is warm and dry. No rash noted.  Papular and urticarial rash in the posterior neck and scalp. Chest x-ray shoulders and chest and proximal extremities.  Psychiatric: She has a normal mood and affect. Her behavior is normal.     ED Course  Procedures (including critical care time) Labs Review Labs Reviewed - No data to display Imaging Review No results found.  MDM   1. Urticaria   2. Anaphylaxis, initial encounter    Discussion I reexamined her x2. Her tongue appears normal she is asymptomatic. A minimal sensation of a "lump" in her throat a normal pharyngeal exam. Normal clear lungs. Psyllium lie supine. Resting comfortably well oxygenated asymptomatic no wheezing no stridor urticaria improved but not resolved. Plan will be home. She has a prescription at the pharmacy for prednisone x5 days for her primary care physician. Continue on Benadryl. Atarax at night. She has an EpiPen from her primary care physician and asked to use caution with this. She has sudden airway closing sensation and is not immediately available in the hospital she may need to use this. Obviously with consideration to her history of an AICD.    Roney Marion, MD 12/07/12 [phone]

## 2012-12-08 ENCOUNTER — Encounter: Payer: Self-pay | Admitting: Internal Medicine

## 2012-12-22 ENCOUNTER — Other Ambulatory Visit: Payer: Self-pay | Admitting: Obstetrics and Gynecology

## 2013-01-16 ENCOUNTER — Encounter: Payer: Self-pay | Admitting: Physician Assistant

## 2013-02-11 ENCOUNTER — Other Ambulatory Visit: Payer: Self-pay | Admitting: Physician Assistant

## 2013-02-12 ENCOUNTER — Other Ambulatory Visit: Payer: Self-pay | Admitting: *Deleted

## 2013-02-12 ENCOUNTER — Other Ambulatory Visit: Payer: Self-pay

## 2013-02-12 DIAGNOSIS — I509 Heart failure, unspecified: Secondary | ICD-10-CM

## 2013-02-12 MED ORDER — SPIRONOLACTONE 25 MG PO TABS: 25.0000 mg | ORAL_TABLET | Freq: Two times a day (BID) | ORAL | Status: DC

## 2013-02-12 MED ORDER — DIAZEPAM 5 MG PO TABS: 5.0000 mg | ORAL_TABLET | Freq: Every evening | ORAL | Status: DC | PRN

## 2013-02-12 MED ORDER — CHOLINE FENOFIBRATE 135 MG PO CPDR: 135.0000 mg | DELAYED_RELEASE_CAPSULE | Freq: Every day | ORAL | Status: DC

## 2013-02-12 NOTE — Telephone Encounter (Signed)
Rx was sent to pharmacy electronically. 

## 2013-02-26 ENCOUNTER — Ambulatory Visit (INDEPENDENT_AMBULATORY_CARE_PROVIDER_SITE_OTHER): Payer: PRIVATE HEALTH INSURANCE | Admitting: *Deleted

## 2013-02-26 DIAGNOSIS — I428 Other cardiomyopathies: Secondary | ICD-10-CM

## 2013-02-28 ENCOUNTER — Other Ambulatory Visit: Payer: Self-pay | Admitting: *Deleted

## 2013-02-28 MED ORDER — CARVEDILOL 25 MG PO TABS: 25.0000 mg | ORAL_TABLET | Freq: Two times a day (BID) | ORAL | Status: DC

## 2013-02-28 NOTE — Telephone Encounter (Signed)
Rx was sent to pharmacy electronically. 

## 2013-03-03 LAB — MDC_IDC_ENUM_SESS_TYPE_REMOTE
Battery Remaining Longevity: 22 mo
Battery Voltage: 2.56 V
Brady Statistic AP VP Percent: 3 %
Brady Statistic RA Percent Paced: 3 %
Date Time Interrogation Session: 20141222091848
HighPow Impedance: 78 Ohm
Implantable Pulse Generator Serial Number: 540466
Lead Channel Impedance Value: 400 Ohm
Lead Channel Impedance Value: 450 Ohm
Lead Channel Impedance Value: 460 Ohm
Lead Channel Pacing Threshold Amplitude: 0.5 V
Lead Channel Pacing Threshold Amplitude: 0.75 V
Lead Channel Pacing Threshold Pulse Width: 0.4 ms
Lead Channel Setting Pacing Amplitude: 2.5 V
Lead Channel Setting Pacing Amplitude: 2.5 V
Lead Channel Setting Pacing Pulse Width: 0.4 ms
Lead Channel Setting Sensing Sensitivity: 0.3 mV
Zone Setting Detection Interval: 300 ms
Zone Setting Detection Interval: 330 ms

## 2013-03-15 ENCOUNTER — Encounter: Payer: Self-pay | Admitting: Cardiovascular Disease

## 2013-03-15 ENCOUNTER — Ambulatory Visit (INDEPENDENT_AMBULATORY_CARE_PROVIDER_SITE_OTHER): Payer: PRIVATE HEALTH INSURANCE | Admitting: Cardiovascular Disease

## 2013-03-15 VITALS — BP 112/72 | HR 75 | Ht 67.0 in | Wt 226.8 lb

## 2013-03-15 DIAGNOSIS — E7800 Pure hypercholesterolemia, unspecified: Secondary | ICD-10-CM

## 2013-03-15 DIAGNOSIS — E119 Type 2 diabetes mellitus without complications: Secondary | ICD-10-CM

## 2013-03-15 DIAGNOSIS — I428 Other cardiomyopathies: Secondary | ICD-10-CM

## 2013-03-15 DIAGNOSIS — I5022 Chronic systolic (congestive) heart failure: Secondary | ICD-10-CM

## 2013-03-15 DIAGNOSIS — I5189 Other ill-defined heart diseases: Secondary | ICD-10-CM

## 2013-03-15 DIAGNOSIS — I519 Heart disease, unspecified: Secondary | ICD-10-CM

## 2013-03-15 DIAGNOSIS — G4730 Sleep apnea, unspecified: Secondary | ICD-10-CM

## 2013-03-15 DIAGNOSIS — I10 Essential (primary) hypertension: Secondary | ICD-10-CM

## 2013-03-15 NOTE — Progress Notes (Signed)
Patient ID: [name], female   DOB: June 09, 1955, 58 y.o.   MRN: [id number]      HPI: [name] is a 58 y.o. female who presents to the office for six-month cardiology evaluation.  [name] has a history of a nonischemic cardiomyopathy. Initial ejection fraction was in the range of 25-30%. She is status post biventricular ICD implantation by Dr. Lovena Le. She also has a history of obstructive sleep apnea on CPAP therapy admits to being 100% compliant with usage. On her last  echo in April 2013 her ejection fraction has essentially normalized and was felt to be greater than 55%.  She did have diastolic dysfunction. There is evidence for mild wedge calcification and mild aortic valve sclerosis. 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  She has been on combination therapy consisting of Crestor 20 mg every other day and try Lipitor 35 mg for her mixed hyperlipidemia. Over the past 6 months, she did have some difficulty with diabetic control requiring medication adjustment. She has seen Dr. Chalmers Cater for this. Patient also tells me she developed a Candida infection and developed anaphylaxis in October after taking Flagyl.  She is unaware of any defibrillator discharge. She denies chest pressure. She is unaware of palpitations. She denies significant edema. She presents for 6 month evaluation.   Past Medical History  Diagnosis Date  . Hyperlipidemia   . CHF (congestive heart failure)   . Hypertension 06/25/2011    Renal dopplers - superior mesenteric artery >50% diameter reduction; R renal artery - normal patency; L proximal renal artery 1-59% reduction (lower end of scale); both kidneys normal in size/symmetry with normal cortex and medulla  . Diabetes mellitus   . Automatic implantable cardiac defibrillator in [place]  . Unspecified sleep  apnea   . Leg pain 12/09/2010    doppler of R femoral artery - no evidence of dissection, AV fistula, pseudoaneurysm or other vascular abnormalities  . Sleep apnea     on CPAP - AHI during sleep study was 44  . Obesity     Past Surgical History  Procedure Laterality Date  . Abdominal hysterectomy    . Neck surgery    . Pacemaker insertion    . Cardiac defibrillator placement      ICD by Dr. Lovena Le  . Knee surgery    . Doppler echocardiography  06/25/2011    EF >55%; moderate concentric LV hypertrophy; LA mildly dilated, mild mitral annular calcification;   . Cardiovascular stress test  08/28/2010    R/P MV - pattern of normal perfusion in all regions, no scintigraphic evidence of inducible myocardial ischemia; no EKG changes; normal perfusion study; pt did experience chesst pain during strudy, resolved spontaneously  . Cardiac catheterization  07/15/2006    no significant CAD by cardiac cath, confirmed by intravascular [place] of LAD; non-ischemic cardiomyopathy prob related to uncontrolled hypertension, DM and morbid obesity; moderate pulmonary hypertension; preserved cardiac output and cardiac index    Allergies  Allergen Reactions  . Niacin And Related Cough    flushing  . Penicillins Swelling    Current Outpatient Prescriptions  Medication Sig Dispense Refill  . aspirin 81 MG tablet Take 81 mg by mouth daily. Takes 2      . carvedilol (COREG) 25 MG tablet Take 25 mg by mouth daily.      . Cholecalciferol (  VITAMIN D) 2000 UNITS CAPS Take 1 capsule by mouth daily. 1 TAB QD       . Choline Fenofibrate (TRILIPIX) 135 MG capsule Take 1 capsule (135 mg total) by mouth daily.  90 capsule  1  . diazepam (VALIUM) 5 MG tablet Take 1 tablet (5 mg total) by mouth at bedtime as needed for anxiety.  30 tablet  0  . estrogen-methyltestosterone (ESTRATEST) 1.25-2.5 MG per tablet Take 1 tablet by mouth at bedtime. 1 TAB QHS       . hydrOXYzine (ATARAX/VISTARIL) 25 MG tablet Take 1 tablet (25 mg  total) by mouth every 6 (six) hours as needed for itching.  12 tablet  0  . imipramine (TOFRANIL) 25 MG tablet Take 25 mg by mouth daily. 2 TABS QHS      . insulin aspart protamine- aspart (NOVOLOG MIX 70/30) (70-30) 100 UNIT/ML injection Inject into the skin. 20 units am 25 units pm      . losartan (COZAAR) 100 MG tablet Take 100 mg by mouth daily.      . metFORMIN (GLUCOPHAGE) 1000 MG tablet Take 1,000 mg by mouth 2 (two) times daily with a meal.        . pantoprazole (PROTONIX) 40 MG tablet Take 40 mg by mouth daily.      . rizatriptan (MAXALT) 10 MG tablet Take 10 mg by mouth as needed for migraine. May repeat in 2 hours if needed      . rosuvastatin (CRESTOR) 40 MG tablet Take 20 mg by mouth every other day.      . spironolactone (ALDACTONE) 25 MG tablet Take 1 tablet (25 mg total) by mouth 2 (two) times daily.  180 tablet  1  . torsemide (DEMADEX) 20 MG tablet Take 20 mg by mouth daily.      . verapamil (CALAN-SR) 240 MG CR tablet Take 240 mg by mouth at bedtime. 1 TAB QHS        No current facility-administered medications for this visit.    Socially she is married and has 2 children and 2 grandchildren. There is no tobacco or alcohol use.  ROS no fevers, chills or night sweats. She denies visual changes. There are no headaches. There is no hearing difficulty. She is unaware of any lymphadenopathy. She denies wheezing. She denies presyncope or syncope. He did suffer an anaphylactic reaction secondary to Flagyl. She denies palpitations. She denies chest pain. She denies PND or orthopnea. She denies bleeding. She denies GI symptoms or GU symptoms. She denies edema. She does have obstructive sleep apnea and has been using CPAP with 100% compliance. She is unaware of breakthrough snoring Other comprehensive 14 point system review is negative.  PE BP 112/72  Pulse 75  Ht '5\' 7"'  (1.702 m)  Wt 226 lb 12.8 oz (102.876 kg)  BMI 35.51 kg/m2  General: Alert, oriented, no distress.  Skin: normal  turgor, no rashes HEENT: Normocephalic, atraumatic. Pupils round and reactive; sclera anicteric;no lid lag.  Nose without nasal septal hypertrophy Mouth/Parynx benign; Mallinpatti scale 2/3 Neck: No JVD, no carotid briuts; normal carotid upstroke Lungs: clear to ausculatation and percussion; no wheezing or rales Chest wall: no tenderness to palpation Heart: RRR, s1 s2 normal 1/6 systolic murmur. Abdomen: soft, nontender with central adiposity; no hepatosplenomehaly, BS+; abdominal aorta nontender and not dilated by palpation. Back: No CVA tenderness Pulses 2+ Extremities: no clubbing cyanosis or edema, Homan's sign negative  Neurologic: grossly nonfocal; grossly normal cranial nerves. Psychologic: Normal affect and mood  ECG (interpreted by me) : Atrial lead sensed, ventricular paced rhythm with 100% ventricular capture    LABS:  BMET    Component Value Date/Time   NA 139 12/22/2007 0627   K 3.6 12/22/2007 0627   CL 100 12/22/2007 0627   CO2 31 12/22/2007 0627   GLUCOSE 142* 12/22/2007 0627   BUN 15 12/22/2007 0627   CREATININE 1.12 12/22/2007 0627   CALCIUM 9.9 12/22/2007 0627   GFRNONAA 51* 12/22/2007 0627   GFRAA  Value: >60        The eGFR has been calculated using the MDRD equation. This calculation has not been validated in all clinical 12/22/2007 0627     Hepatic Function Panel  No results found for this basename: prot,  albumin,  ast,  alt,  alkphos,  bilitot,  bilidir,  ibili     CBC    Component Value Date/Time   WBC 6.9 12/13/2007 0827   RBC 4.07 12/13/2007 0827   HGB 12.3 12/13/2007 0827   HCT 35.5* 12/13/2007 0827   PLT 323 12/13/2007 0827   MCV 87.3 12/13/2007 0827   MCHC 34.6 12/13/2007 0827   RDW 13.4 12/13/2007 0827   LYMPHSABS 2.4 06/07/2007 1456   MONOABS 0.4 12/13/2007 0827   EOSABS 0.2 12/13/2007 0827   BASOSABS 0.1 12/13/2007 0827     BNP No results found for this basename: probnp    Lipid Panel  No results found for this basename: chol,  trig,   hdl,  cholhdl,  vldl,  ldlcalc     RADIOLOGY: No results found.    ASSESSMENT AND PLAN: [name] is a 58 year old female with a previous history of a nonischemic cardiomyopathy with subsequent normalization of LV function following biventricular ICD implantation and medical therapy. Her pressure today is well controlled ranging from 112/72-130/70 on combination therapy consisting of carvedilol, losartan, in addition to her superolateral torsemide and verapamil. She is on fetal fibroid and low-dose Crestor for next hyperlipidemia. She now is on NovoLog 70/30 for her diabetes mellitus in addition to metformin 1000 mg twice a day. She does have a history of migraine headaches but these have been stable as she has not required use of Maxalt. However, she does take imipramine 25 mg daily for this. Presently, there are no signs of edema with her torsemide and spironolactone. She is using CPAP therapy 100% of the time for her obstructive sleep apnea seems to be stable on her current pressures. Her last echo Doppler study was in April 2013. I am recommending that she have a followup echo Doppler study done in 6 months which will be 2 years for followup a followup evaluation of LV function. She also tells me she recently had laboratory obtained  at Dr. Clair Gulling office. His office is currently closed. I will try to obtain the laboratory results to make certain adjustments to her medications we'll not need to be made with reference to her hypertension therapy lipid therapy, etc. I will contact her if adjustments need to be made. I recommended that I see her back in 6 months for followup cardiologic evaluation or sooner if problems arise.   Troy Sine, MD, [facility]  03/15/2013 7:53 AM

## 2013-03-15 NOTE — Patient Instructions (Signed)
Your physician recommends that you schedule a follow-up appointment and echo in 6 months.  

## 2013-03-20 ENCOUNTER — Encounter: Payer: Self-pay | Admitting: Cardiovascular Disease

## 2013-03-21 ENCOUNTER — Encounter: Payer: Self-pay | Admitting: *Deleted

## 2013-03-28 ENCOUNTER — Encounter: Payer: Self-pay | Admitting: Physician Assistant

## 2013-04-06 ENCOUNTER — Encounter: Payer: Self-pay | Admitting: Internal Medicine

## 2013-05-16 LAB — HM PAP SMEAR

## 2013-05-30 ENCOUNTER — Encounter: Payer: Self-pay | Admitting: Internal Medicine

## 2013-05-30 ENCOUNTER — Ambulatory Visit (INDEPENDENT_AMBULATORY_CARE_PROVIDER_SITE_OTHER): Payer: PRIVATE HEALTH INSURANCE | Admitting: *Deleted

## 2013-05-30 DIAGNOSIS — I428 Other cardiomyopathies: Secondary | ICD-10-CM

## 2013-05-30 DIAGNOSIS — I5022 Chronic systolic (congestive) heart failure: Secondary | ICD-10-CM

## 2013-06-11 ENCOUNTER — Other Ambulatory Visit: Payer: Self-pay | Admitting: *Deleted

## 2013-06-11 MED ORDER — LOSARTAN POTASSIUM 100 MG PO TABS: 100.0000 mg | ORAL_TABLET | Freq: Every day | ORAL | Status: DC

## 2013-06-11 NOTE — Telephone Encounter (Signed)
Rx refill sent to patients pharmacy  

## 2013-06-12 LAB — MDC_IDC_ENUM_SESS_TYPE_REMOTE
Brady Statistic AP VS Percent: 1 %
Brady Statistic AS VP Percent: 97 %
Brady Statistic AS VS Percent: 1 %
Brady Statistic RA Percent Paced: 2.4 %
Date Time Interrogation Session: 20150325074845
HighPow Impedance: 86 Ohm
HighPow Impedance: 86 Ohm
Implantable Pulse Generator Serial Number: 540466
Lead Channel Impedance Value: 410 Ohm
Lead Channel Impedance Value: 480 Ohm
Lead Channel Pacing Threshold Amplitude: 0.5 V
Lead Channel Pacing Threshold Amplitude: 0.5 V
Lead Channel Pacing Threshold Amplitude: 0.75 V
Lead Channel Pacing Threshold Pulse Width: 0.4 ms
Lead Channel Pacing Threshold Pulse Width: 0.4 ms
Lead Channel Sensing Intrinsic Amplitude: 4.2 mV
Lead Channel Setting Pacing Amplitude: 2 V
Lead Channel Setting Pacing Pulse Width: 0.4 ms
Lead Channel Setting Sensing Sensitivity: 0.3 mV
MDC IDC MSMT BATTERY REMAINING LONGEVITY: 13 mo
MDC IDC MSMT BATTERY VOLTAGE: 2.54 V
MDC IDC MSMT LEADCHNL LV IMPEDANCE VALUE: 500 Ohm
MDC IDC MSMT LEADCHNL RA PACING THRESHOLD PULSEWIDTH: 0.4 ms
MDC IDC MSMT LEADCHNL RV SENSING INTR AMPL: 11.1 mV
MDC IDC SET LEADCHNL LV PACING AMPLITUDE: 2.5 V
MDC IDC SET LEADCHNL LV PACING PULSEWIDTH: 0.4 ms
MDC IDC SET LEADCHNL RV PACING AMPLITUDE: 2.5 V
MDC IDC SET ZONE DETECTION INTERVAL: 300 ms
MDC IDC SET ZONE DETECTION INTERVAL: 330 ms
MDC IDC STAT BRADY AP VP PERCENT: 2.3 %
Zone Setting Detection Interval: 250 ms

## 2013-06-21 ENCOUNTER — Encounter: Payer: Self-pay | Admitting: *Deleted

## 2013-06-29 ENCOUNTER — Other Ambulatory Visit: Payer: Self-pay

## 2013-06-29 MED ORDER — TORSEMIDE 20 MG PO TABS: 20.0000 mg | ORAL_TABLET | Freq: Every day | ORAL | Status: DC

## 2013-06-29 MED ORDER — CHOLINE FENOFIBRATE 135 MG PO CPDR: 135.0000 mg | DELAYED_RELEASE_CAPSULE | Freq: Every day | ORAL | Status: DC

## 2013-06-29 NOTE — Telephone Encounter (Signed)
Rx was sent to pharmacy electronically. 

## 2013-07-03 ENCOUNTER — Encounter: Payer: Self-pay | Admitting: Physician Assistant

## 2013-07-22 DIAGNOSIS — N182 Chronic kidney disease, stage 2 (mild): Secondary | ICD-10-CM

## 2013-07-22 DIAGNOSIS — E1322 Other specified diabetes mellitus with diabetic chronic kidney disease: Secondary | ICD-10-CM | POA: Insufficient documentation

## 2013-07-22 DIAGNOSIS — E119 Type 2 diabetes mellitus without complications: Secondary | ICD-10-CM | POA: Insufficient documentation

## 2013-07-22 DIAGNOSIS — E1365 Other specified diabetes mellitus with hyperglycemia: Secondary | ICD-10-CM

## 2013-07-22 DIAGNOSIS — E1129 Type 2 diabetes mellitus with other diabetic kidney complication: Secondary | ICD-10-CM | POA: Insufficient documentation

## 2013-07-22 DIAGNOSIS — G43909 Migraine, unspecified, not intractable, without status migrainosus: Secondary | ICD-10-CM | POA: Insufficient documentation

## 2013-07-24 ENCOUNTER — Telehealth: Payer: Self-pay | Admitting: Cardiovascular Disease

## 2013-07-24 ENCOUNTER — Ambulatory Visit (INDEPENDENT_AMBULATORY_CARE_PROVIDER_SITE_OTHER): Payer: PRIVATE HEALTH INSURANCE | Admitting: Physician Assistant

## 2013-07-24 ENCOUNTER — Encounter: Payer: Self-pay | Admitting: Physician Assistant

## 2013-07-24 VITALS — BP 138/78 | HR 80 | Temp 98.1°F | Resp 16 | Ht 67.0 in | Wt 225.0 lb

## 2013-07-24 DIAGNOSIS — E7800 Pure hypercholesterolemia, unspecified: Secondary | ICD-10-CM

## 2013-07-24 DIAGNOSIS — Z0000 Encounter for general adult medical examination without abnormal findings: Secondary | ICD-10-CM

## 2013-07-24 DIAGNOSIS — I10 Essential (primary) hypertension: Secondary | ICD-10-CM

## 2013-07-24 DIAGNOSIS — I509 Heart failure, unspecified: Secondary | ICD-10-CM

## 2013-07-24 DIAGNOSIS — Z79899 Other long term (current) drug therapy: Secondary | ICD-10-CM

## 2013-07-24 DIAGNOSIS — E559 Vitamin D deficiency, unspecified: Secondary | ICD-10-CM

## 2013-07-24 DIAGNOSIS — Z1159 Encounter for screening for other viral diseases: Secondary | ICD-10-CM

## 2013-07-24 DIAGNOSIS — E119 Type 2 diabetes mellitus without complications: Secondary | ICD-10-CM

## 2013-07-24 LAB — HEPATIC FUNCTION PANEL
ALT: 25 U/L (ref 0–35)
AST: 20 U/L (ref 0–37)
Albumin: 4.1 g/dL (ref 3.5–5.2)
Alkaline Phosphatase: 64 U/L (ref 39–117)
Bilirubin, Direct: 0.1 mg/dL (ref 0.0–0.3)
Indirect Bilirubin: 0.4 mg/dL (ref 0.2–1.2)
Total Bilirubin: 0.5 mg/dL (ref 0.2–1.2)
Total Protein: 7 g/dL (ref 6.0–8.3)

## 2013-07-24 LAB — BASIC METABOLIC PANEL WITH GFR
BUN: 14 mg/dL (ref 6–23)
CO2: 29 mEq/L (ref 19–32)
Calcium: 9.2 mg/dL (ref 8.4–10.5)
Chloride: 101 mEq/L (ref 96–112)
Creat: 0.73 mg/dL (ref 0.50–1.10)
GFR, Est Non African American: >89 mL/min
Glucose, Bld: 214 mg/dL — ABNORMAL HIGH (ref 70–99)
POTASSIUM: 3.8 meq/L (ref 3.5–5.3)
SODIUM: 138 meq/L (ref 135–145)

## 2013-07-24 LAB — MAGNESIUM: Magnesium: 1.3 mg/dL — ABNORMAL LOW (ref 1.5–2.5)

## 2013-07-24 LAB — IRON AND TIBC
%SAT: 18 % — ABNORMAL LOW (ref 20–55)
Iron: 76 ug/dL (ref 42–145)
TIBC: 433 ug/dL (ref 250–470)
UIBC: 357 ug/dL (ref 125–400)

## 2013-07-24 LAB — CBC WITH DIFFERENTIAL/PLATELET
BASOS ABS: 0 10*3/uL (ref 0.0–0.1)
Basophils Relative: 0 % (ref 0–1)
Eosinophils Absolute: 0.2 10*3/uL (ref 0.0–0.7)
Eosinophils Relative: 2 % (ref 0–5)
HCT: 40.4 % (ref 36.0–46.0)
HEMOGLOBIN: 14.1 g/dL (ref 12.0–15.0)
Lymphocytes Relative: 37 % (ref 12–46)
Lymphs Abs: 3.4 10*3/uL (ref 0.7–4.0)
MCH: 30.5 pg (ref 26.0–34.0)
MCHC: 34.9 g/dL (ref 30.0–36.0)
MCV: 87.4 fL (ref 78.0–100.0)
MONOS PCT: 6 % (ref 3–12)
Monocytes Absolute: 0.5 10*3/uL (ref 0.1–1.0)
NEUTROS ABS: 5 10*3/uL (ref 1.7–7.7)
NEUTROS PCT: 55 % (ref 43–77)
Platelets: 279 10*3/uL (ref 150–400)
RBC: 4.62 MIL/uL (ref 3.87–5.11)
RDW: 14 % (ref 11.5–15.5)
WBC: 9.1 10*3/uL (ref 4.0–10.5)

## 2013-07-24 LAB — LIPID PANEL
Cholesterol: 102 mg/dL (ref 0–200)
HDL: 32 mg/dL — ABNORMAL LOW (ref >39)
LDL CALC: 39 mg/dL (ref 0–99)
TRIGLYCERIDES: 154 mg/dL — AB (ref <150)
Total CHOL/HDL Ratio: 3.2 Ratio
VLDL: 31 mg/dL (ref 0–40)

## 2013-07-24 LAB — VITAMIN B12: VITAMIN B 12: 339 pg/mL (ref 211–911)

## 2013-07-24 LAB — TSH: TSH: 1.631 u[IU]/mL (ref 0.350–4.500)

## 2013-07-24 LAB — FERRITIN: Ferritin: 30 ng/mL (ref 10–291)

## 2013-07-24 LAB — HEMOGLOBIN A1C
Hgb A1c MFr Bld: 11.3 % — ABNORMAL HIGH (ref <5.7)
Mean Plasma Glucose: 278 mg/dL — ABNORMAL HIGH (ref <117)

## 2013-07-24 LAB — HEPATITIS C ANTIBODY: HCV Ab: NEGATIVE

## 2013-07-24 LAB — HEPATITIS A ANTIBODY, TOTAL: Hep A Total Ab: NONREACTIVE

## 2013-07-24 LAB — HEPATITIS B CORE ANTIBODY, TOTAL: HEP B C TOTAL AB: NONREACTIVE

## 2013-07-24 LAB — HEPATITIS B SURFACE ANTIBODY,QUALITATIVE: HEP B S AB: NEGATIVE

## 2013-07-24 MED ORDER — VERAPAMIL HCL ER 240 MG PO TBCR: 240.0000 mg | EXTENDED_RELEASE_TABLET | Freq: Every day | ORAL | Status: DC

## 2013-07-24 MED ORDER — FENOFIBRATE MICRONIZED 134 MG PO CAPS: 134.0000 mg | ORAL_CAPSULE | Freq: Every day | ORAL | Status: DC

## 2013-07-24 MED ORDER — SPIRONOLACTONE 25 MG PO TABS: 25.0000 mg | ORAL_TABLET | Freq: Two times a day (BID) | ORAL | Status: DC

## 2013-07-24 MED ORDER — PHENTERMINE HCL 37.5 MG PO TABS: 37.5000 mg | ORAL_TABLET | Freq: Every day | ORAL | Status: DC

## 2013-07-24 NOTE — Patient Instructions (Signed)
What is the TMJ? The temporomandibular (tem-PUH-ro-man-DIB-yoo-ler) joint, or the TMJ, connects the upper and lower jawbones. This joint allows the jaw to open wide and move back and forth when you chew, talk, or yawn.There are also several muscles that help this joint move. There can be muscle tightness and pain in the muscle that can cause several symptoms.  What causes TMJ pain? There are many causes of TMJ pain. Repeated chewing (for example, chewing gum) and clenching your teeth can cause pain in the joint. Some TMJ pain has no obvious cause. What can I do to ease the pain? There are many things you can do to help your pain get better. When you have pain:  Eat soft foods and stay away from chewy foods (for example, taffy) Try to use both sides of your mouth to chew Don't chew gum Don't open your mouth wide (for example, during yawning or singing) Don't bite your cheeks or fingernails Lower your amount of stress and worry Applying a warm, damp washcloth to the joint may help. Over-the-counter pain medicines such as ibuprofen (one brand: Advil) or acetaminophen (one brand: Tylenol) might also help. Do not use these medicines if you are allergic to them or if your doctor told you not to use them. How can I stop the pain from coming back? When your pain is better, you can do these exercises to make your muscles stronger and to keep the pain from coming back:  Resisted mouth opening: Place your thumb or two fingers under your chin and open your mouth slowly, pushing up lightly on your chin with your thumb. Hold for three to six seconds. Close your mouth slowly. Resisted mouth closing: Place your thumbs under your chin and your two index fingers on the ridge between your mouth and the bottom of your chin. Push down lightly on your chin as you close your mouth. Tongue up: Slowly open and close your mouth while keeping the tongue touching the roof of the mouth. Side-to-side jaw movement: Place an  object about one fourth of an inch thick (for example, two tongue depressors) between your front teeth. Slowly move your jaw from side to side. Increase the thickness of the object as the exercise becomes easier Forward jaw movement: Place an object about one fourth of an inch thick between your front teeth and move the bottom jaw forward so that the bottom teeth are in front of the top teeth. Increase the thickness of the object as the exercise becomes easier. These exercises should not be painful. If it hurts to do these exercises, stop doing them and talk to your family doctor.   Preventative Care for Adults - Female      MAINTAIN REGULAR HEALTH EXAMS:  A routine yearly physical is a good way to check in with your primary care provider about your health and preventive screening. It is also an opportunity to share updates about your health and any concerns you have, and receive a thorough all-over exam.   Most health insurance companies pay for at least some preventative services.  Check with your health plan for specific coverages.  WHAT PREVENTATIVE SERVICES DO WOMEN NEED?  Adult women should have their weight and blood pressure checked regularly.   Women age 35 and older should have their cholesterol levels checked regularly.  Women should be screened for cervical cancer with a Pap smear and pelvic exam beginning at either age 21, or 3 years after they become sexually activity.      Breast cancer screening generally begins at age 40 with a mammogram and breast exam by your primary care provider.    Beginning at age 50 and continuing to age 75, women should be screened for colorectal cancer.  Certain people may need continued testing until age 85.  Updating vaccinations is part of preventative care.  Vaccinations help protect against diseases such as the flu.  Osteoporosis is a disease in which the bones lose minerals and strength as we age. Women ages 65 and over should discuss this  with their caregivers, as should women after menopause who have other risk factors.  Lab tests are generally done as part of preventative care to screen for anemia and blood disorders, to screen for problems with the kidneys and liver, to screen for bladder problems, to check blood sugar, and to check your cholesterol level.  Preventative services generally include counseling about diet, exercise, avoiding tobacco, drugs, excessive alcohol consumption, and sexually transmitted infections.    GENERAL RECOMMENDATIONS FOR GOOD HEALTH:  Healthy diet:  Eat a variety of foods, including fruit, vegetables, animal or vegetable protein, such as meat, fish, chicken, and eggs, or beans, lentils, tofu, and grains, such as rice.  Drink plenty of water daily.  Decrease saturated fat in the diet, avoid lots of red meat, processed foods, sweets, fast foods, and fried foods.  Exercise:  Aerobic exercise helps maintain good heart health. At least 30-40 minutes of moderate-intensity exercise is recommended. For example, a brisk walk that increases your heart rate and breathing. This should be done on most days of the week.   Find a type of exercise or a variety of exercises that you enjoy so that it becomes a part of your daily life.  Examples are running, walking, swimming, water aerobics, and biking.  For motivation and support, explore group exercise such as aerobic class, spin class, Zumba, Yoga,or  martial arts, etc.    Set exercise goals for yourself, such as a certain weight goal, walk or run in a race such as a 5k walk/run.  Speak to your primary care provider about exercise goals.  Disease prevention:  If you smoke or chew tobacco, find out from your caregiver how to quit. It can literally save your life, no matter how long you have been a tobacco user. If you do not use tobacco, never begin.   Maintain a healthy diet and normal weight. Increased weight leads to problems with blood pressure and  diabetes.   The Body Mass Index or BMI is a way of measuring how much of your body is fat. Having a BMI above 27 increases the risk of heart disease, diabetes, hypertension, stroke and other problems related to obesity. Your caregiver can help determine your BMI and based on it develop an exercise and dietary program to help you achieve or maintain this important measurement at a healthful level.  High blood pressure causes heart and blood vessel problems.  Persistent high blood pressure should be treated with medicine if weight loss and exercise do not work.   Fat and cholesterol leaves deposits in your arteries that can block them. This causes heart disease and vessel disease elsewhere in your body.  If your cholesterol is found to be high, or if you have heart disease or certain other medical conditions, then you may need to have your cholesterol monitored frequently and be treated with medication.   Ask if you should have a cardiac stress test if your history suggests this. A stress   test is a test done on a treadmill that looks for heart disease. This test can find disease prior to there being a problem.  Menopause can be associated with physical symptoms and risks. Hormone replacement therapy is available to decrease these. You should talk to your caregiver about whether starting or continuing to take hormones is right for you.   Osteoporosis is a disease in which the bones lose minerals and strength as we age. This can result in serious bone fractures. Risk of osteoporosis can be identified using a bone density scan. Women ages 65 and over should discuss this with their caregivers, as should women after menopause who have other risk factors. Ask your caregiver whether you should be taking a calcium supplement and Vitamin D, to reduce the rate of osteoporosis.   Avoid drinking alcohol in excess (more than two drinks per day).  Avoid use of street drugs. Do not share needles with anyone. Ask for  professional help if you need assistance or instructions on stopping the use of alcohol, cigarettes, and/or drugs.  Brush your teeth twice a day with fluoride toothpaste, and floss once a day. Good oral hygiene prevents tooth decay and gum disease. The problems can be painful, unattractive, and can cause other health problems. Visit your dentist for a routine oral and dental check up and preventive care every 6-12 months.   Look at your skin regularly.  Use a mirror to look at your back. Notify your caregivers of changes in moles, especially if there are changes in shapes, colors, a size larger than a pencil eraser, an irregular border, or development of new moles.  Safety:  Use seatbelts 100% of the time, whether driving or as a passenger.  Use safety devices such as hearing protection if you work in environments with loud noise or significant background noise.  Use safety glasses when doing any work that could send debris in to the eyes.  Use a helmet if you ride a bike or motorcycle.  Use appropriate safety gear for contact sports.  Talk to your caregiver about gun safety.  Use sunscreen with a SPF (or skin protection factor) of 15 or greater.  Lighter skinned people are at a greater risk of skin cancer. Don't forget to also wear sunglasses in order to protect your eyes from too much damaging sunlight. Damaging sunlight can accelerate cataract formation.   Practice safe sex. Use condoms. Condoms are used for birth control and to help reduce the spread of sexually transmitted infections (or STIs).  Some of the STIs are gonorrhea (the clap), chlamydia, syphilis, trichomonas, herpes, HPV (human papilloma virus) and HIV (human immunodeficiency virus) which causes AIDS. The herpes, HIV and HPV are viral illnesses that have no cure. These can result in disability, cancer and death.   Keep carbon monoxide and smoke detectors in your home functioning at all times. Change the batteries every 6 months or use  a model that plugs into the wall.   Vaccinations:  Stay up to date with your tetanus shots and other required immunizations. You should have a booster for tetanus every 10 years. Be sure to get your flu shot every year, since 5%-20% of the [place] population comes down with the flu. The flu vaccine changes each year, so being vaccinated once is not enough. Get your shot in the fall, before the flu season peaks.   Other vaccines to consider:  Human Papilloma Virus or HPV causes cancer of the cervix, and other infections that can   be transmitted from person to person. There is a vaccine for HPV, and females should get immunized between the ages of 11 and 26. It requires a series of 3 shots.   Pneumococcal vaccine to protect against certain types of pneumonia.  This is normally recommended for adults age 65 or older.  However, adults younger than 57 years old with certain underlying conditions such as diabetes, heart or lung disease should also receive the vaccine.  Shingles vaccine to protect against Varicella Zoster if you are older than age 60, or younger than 57 years old with certain underlying illness.  Hepatitis A vaccine to protect against a form of infection of the liver by a virus acquired from food.  Hepatitis B vaccine to protect against a form of infection of the liver by a virus acquired from blood or body fluids, particularly if you work in health care.  If you plan to travel internationally, check with your local health department for specific vaccination recommendations.  Cancer Screening:  Breast cancer screening is essential to preventive care for women. All women age 20 and older should perform a breast self-exam every month. At age 40 and older, women should have their caregiver complete a breast exam each year. Women at ages 40 and older should have a mammogram (x-ray film) of the breasts. Your caregiver can discuss how often you need mammograms.    Cervical cancer screening  includes taking a Pap smear (sample of cells examined under a microscope) from the cervix (end of the uterus). It also includes testing for HPV (Human Papilloma Virus, which can cause cervical cancer). Screening and a pelvic exam should begin at age 21, or 3 years after a woman becomes sexually active. Screening should occur every year, with a Pap smear but no HPV testing, up to age 30. After age 30, you should have a Pap smear every 3 years with HPV testing, if no HPV was found previously.   Most routine colon cancer screening begins at the age of 50. On a yearly basis, doctors may provide special easy to use take-home tests to check for hidden blood in the stool. Sigmoidoscopy or colonoscopy can detect the earliest forms of colon cancer and is life saving. These tests use a small camera at the end of a tube to directly examine the colon. Speak to your caregiver about this at age 50, when routine screening begins (and is repeated every 5 years unless early forms of pre-cancerous polyps or small growths are found).     

## 2013-07-24 NOTE — Telephone Encounter (Signed)
Pharmacy --[facility]  Has submitted multiple requests in past 3 weeks and still not received.  Wants to talk to someone.  She is out of medicine  Please call

## 2013-07-24 NOTE — Progress Notes (Signed)
Complete Physical  Assessment and Plan: Tetanus- left before getting- NEEDS NEXT VISIT Colonoscopy this year Obesity with co morbidities- long discussion about weight loss, diet, and exercise, Will start the patient on phentermine- hand out given. Hyperlipidemia--continue medications, check lipids, decrease fatty foods, increase activity.   Hypertension-- continue medications, DASH diet, exercise and monitor at home. Call if greater than 130/80.  Automatic implantable cardiac defibrillator in situ- [employer]- continue follow up Cardio  Leg pain-doppler of R femoral artery - no evidence of dissection, AV fistula, pseudoaneurysm or other vascular abnormalities- cont follow up cardio  Sleep apnea-continue CPAP  Type II or unspecified type diabetes mellitus without mention of complication, not stated as uncontrolled-Discussed general issues about diabetes pathophysiology and management., Educational material distributed., Suggested low cholesterol diet., Encouraged aerobic exercise., Discussed foot care., Reminded to get yearly retinal exam.  CHF (congestive heart failure)- sent in spirolactone, monitor weight daily, decrease sugars and salt  Migraines with TMJ-information given to the patient, no gum/decrease hard foods, warm wet wash clothes, decrease stress, talk with dentist about possible night guard, can do massage, and exercise.     Discussed med's effects and SE's. Screening labs and tests as requested with regular follow-up as recommended.  HPI 58 y.o. female  presents for a complete physical. Her blood pressure has been controlled at home, today their BP is BP: 138/78 mmHg She does not workout regularly, she walks a little. She denies chest pain, shortness of breath, dizziness.  She is on cholesterol medication and denies myalgias. Her cholesterol is at goal. The cholesterol last visit was:  LDL 25 She has been working on diet and exercise for diabetes, and denies nausea and polydipsia.   She does complain of paresthesia of the feet, polyuria and visual disturbances. She follows up with Dr. Talmage Nap for her uncontrolled DM, she was switched to Novolog mix 20 in the morning and 25 at night and metformin, in the AM sugar has been running 150's but as high as 300, she only checks it once a week and is often noncompliant. She states she often does not give herself the night time dose until after dinner, eats out often.  Last A1C in the office was: 10.6. She see's Dr. Hazle Quant for eye exams and states the last one was a year ago and she is due.  Complicated heart history with NIDCMP with AICD follows with Dr. Tresa Endo and follows with them regularly. She has been out of the spirolactone for 1 week due to a confusion at the pharmacy. Next follow up is in June.  Her weight is off 5 lbs.  Wt Readings from Last 3 Encounters:  07/24/13 225 lb (102.059 kg)  03/15/13 226 lb 12.8 oz (102.876 kg)  12/07/12 220 lb (99.791 kg)   Patient is on Vitamin D supplement.   She has been having achy pain and leg cramps at night the last month.  Has migraines, and has been fighting headaches for last 3 days. States it feels like a band around her head.  Mobid obesity with noncompliance- would like to restart phentermine, she states that she was on it last year and was doing well on it but when she had allergy problems she stopped all of her medications. She has found out that it was a yeast allergy and she would like to try it again.   Current Medications:  Current Outpatient Prescriptions on File Prior to Visit  Medication Sig Dispense Refill  . aspirin 81 MG tablet Take 81  mg by mouth daily. Takes 2      . carvedilol (COREG) 25 MG tablet Take 25 mg by mouth daily.      . Choline Fenofibrate (TRILIPIX) 135 MG capsule Take 1 capsule (135 mg total) by mouth daily.  90 capsule  2  . diazepam (VALIUM) 5 MG tablet Take 1 tablet (5 mg total) by mouth at bedtime as needed for anxiety.  30 tablet  0  . EPINEPHrine  (EPI-PEN) 0.3 mg/0.3 mL SOAJ injection Inject into the muscle once.      . estrogen-methyltestosterone (ESTRATEST) 1.25-2.5 MG per tablet Take 1 tablet by mouth at bedtime. 1 TAB QHS       . imipramine (TOFRANIL) 25 MG tablet Take 25 mg by mouth daily. 2 TABS QHS      . insulin aspart protamine- aspart (NOVOLOG MIX 70/30) (70-30) 100 UNIT/ML injection Inject into the skin. 20 units am 25 units pm      . loratadine (CLARITIN) 10 MG tablet Take 10 mg by mouth daily as needed for allergies.      Marland Kitchen losartan (COZAAR) 100 MG tablet Take 1 tablet (100 mg total) by mouth daily.  90 tablet  3  . metFORMIN (GLUCOPHAGE) 1000 MG tablet Take 1,000 mg by mouth 2 (two) times daily with a meal.        . pantoprazole (PROTONIX) 40 MG tablet Take 40 mg by mouth daily.      . Potassium 99 MG TABS Take 1 tablet by mouth daily.      . rizatriptan (MAXALT) 10 MG tablet Take 10 mg by mouth as needed for migraine. May repeat in 2 hours if needed      . rosuvastatin (CRESTOR) 40 MG tablet Take 20 mg by mouth every other day.      . spironolactone (ALDACTONE) 25 MG tablet Take 1 tablet (25 mg total) by mouth 2 (two) times daily.  180 tablet  1  . torsemide (DEMADEX) 20 MG tablet Take 1 tablet (20 mg total) by mouth daily.  90 tablet  2  . verapamil (CALAN-SR) 240 MG CR tablet Take 240 mg by mouth at bedtime. 1 TAB QHS        No current facility-administered medications on file prior to visit.   Health Maintenance:   Tetanus: ? Unknown  Pneumovax: declines Flu vaccine: declines Zostavax: N/A Pap: March 2015 Dr. Tenny Craw MGM: March 2015 normal DEXA: 2006 t -1.3 Colonoscopy: Dec 2007- due this year EGD: Dec 2007  Patient Care Team: Lucky Cowboy, MD as PCP - General (Internal Medicine) Lennette Bihari, MD as Consulting Physician (Cardiology) Fletcher Anon, MD as Consulting Physician (Pediatrics) Florencia Reasons, MD as Consulting Physician (Gastroenterology) Dorisann Frames, MD as Consulting Physician  (Endocrinology) Miguel Aschoff, MD as Consulting Physician (Obstetrics and Gynecology) Noralee Stain, MD as Consulting Physician (Dermatology)  Allergies:  Allergies  Allergen Reactions  . Flagyl [Metronidazole]   . Niacin And Related Cough    flushing  . Penicillins Swelling   Medical History:  Past Medical History  Diagnosis Date  . Hyperlipidemia   . Hypertension 06/25/2011    Renal dopplers - superior mesenteric artery >50% diameter reduction; R renal artery - normal patency; L proximal renal artery 1-59% reduction (lower end of scale); both kidneys normal in size/symmetry with normal cortex and medulla  . Diabetes mellitus   . Automatic implantable cardiac defibrillator in situ     [employer] MODEL 7122  . Unspecified sleep apnea   .  Leg pain 12/09/2010    doppler of R femoral artery - no evidence of dissection, AV fistula, pseudoaneurysm or other vascular abnormalities  . Sleep apnea     on CPAP - AHI during sleep study was 44  . Obesity   . Type II or unspecified type diabetes mellitus without mention of complication, not stated as uncontrolled   . Type II or unspecified type diabetes mellitus without mention of complication, not stated as uncontrolled   . CHF (congestive heart failure)   . Migraines    Surgical History:  Past Surgical History  Procedure Laterality Date  . Abdominal hysterectomy    . Neck surgery    . Pacemaker insertion    . Cardiac defibrillator placement      ICD by Dr. Ladona Ridgelaylor  . Knee surgery    . Doppler echocardiography  06/25/2011    EF >55%; moderate concentric LV hypertrophy; LA mildly dilated, mild mitral annular calcification;   . Cardiovascular stress test  08/28/2010    R/P MV - pattern of normal perfusion in all regions, no scintigraphic evidence of inducible myocardial ischemia; no EKG changes; normal perfusion study; pt did experience chesst pain during strudy, resolved spontaneously  . Cardiac catheterization  07/15/2006    no significant CAD  by cardiac cath, confirmed by intravascular US of LAD; non-ischemic cardiomyopathy prob related to uncontrolled hypertension, DM and morbid obesity; moderate pulmonary hypertension; preserved cardiac output and cardiac index   Family History:  Family History  Problem Relation Age of Onset  . Heart attack Father    Social History:  History  Substance Use Topics  . Smoking status: Former Smoker    Types: Cigarettes    Quit date: 03/09/1975  . Smokeless tobacco: Not on file  . Alcohol Use: Yes    Review of Systems: [X]  = complains of  [ ]  = denies  General: Fatigue [ ]  Fever [ ]  Chills [ ]  Weakness [ ]   Insomnia [ ] Weight change [ ]  Night sweats [ ]   Change in appetite [ ]  Eyes: Redness [ ]  Blurred vision [ ]  Diplopia [ ]  Discharge [ ]   ENT: Congestion [ ]  Sinus Pain [ X] Post Nasal Drip [ ]  Sore Throat [ ]  Earache [ ]  hearing loss [ ]  Tinnitus [ ]  Snoring [ ]   Cardiac: Chest pain/pressure [ ]  SOB [ ]  Orthopnea [ ]   Palpitations [ ]   Paroxysmal nocturnal dyspnea[ ]  Claudication [ ]  Edema [ ]   Pulmonary: Cough [ ]  Wheezing[ ]   SOB [ ]   Pleurisy [ ]   GI: Nausea [ ]  Vomiting[ ]  Dysphagia[ ]  Heartburn[ ]  Abdominal pain [ ]  Constipation [ ] ; Diarrhea [ ]  BRBPR [ ]  Melena[ ]  Bloating [ ]  Hemorrhoids [ ]   GU: Hematuria[ ]  Dysuria [ ]  Nocturia[ ]  Urgency [ ]   Hesitancy [ ]  Discharge [ ]  Frequency [ ]   Breast:  Breast lumps [ ]   nipple discharge [ ]    Neuro: Headaches[X ] Vertigo[ ]  Paresthesias[ ]  Spasm [ ]  Speech changes [ ]  Incoordination [ ]   Ortho: Arthritis [ ]  Joint pain [ ]  Muscle pain [ ]  Joint swelling [ ]  Back Pain [ ]  Skin:  Rash [ ]   Pruritis [ ]  Change in skin lesion [ ]   Psych: Depression[ ]  Anxiety[ ]  Confusion [ ]  Memory loss [ ]   Heme/Lypmh: Bleeding [ ]  Bruising [ ]  Enlarged lymph nodes [ ]   Endocrine: Visual blurring Arly.Keller[X ] Paresthesia [ X] Polyuria Arly.Keller[X ] Polydypsea [ ]   Heat/cold intolerance [ ]  Hypoglycemia [ ]   Physical Exam: Estimated body mass index is 35.23 kg/(m^2) as  calculated from the following:   Height as of this encounter: 5\' 7"  (1.702 m).   Weight as of this encounter: 225 lb (102.059 kg). BP 138/78  Pulse 80  Temp(Src) 98.1 F (36.7 C)  Resp 16  Ht 5\' 7"  (1.702 m)  Wt 225 lb (102.059 kg)  BMI 35.23 kg/m2 General Appearance: Well nourished, in no apparent distress. Eyes: PERRLA, EOMs, conjunctiva no swelling or erythema, normal fundi and vessels. Sinuses: No Frontal/maxillary tenderness ENT/Mouth: Ext aud canals clear, normal light reflex with TMs without erythema, bulging.  Good dentition. No erythema, swelling, or exudate on post pharynx. Tonsils not swollen or erythematous. Hearing normal. + TMJ tenderness Neck: Supple, thyroid normal. No bruits Respiratory: Respiratory effort normal, BS equal bilaterally without rales, rhonchi, wheezing or stridor. Cardio: RRR without murmurs, rubs or gallops. Brisk peripheral pulses with trace edema Chest: symmetric, with normal excursions and percussion. Breasts: defer Abdomen: obese, soft, nontender without organomegaly.   Lymphatics: Non tender without lymphadenopathy.  Genitourinary: defer Musculoskeletal: Full ROM all peripheral extremities,5/5 strength, and normal gait. Skin: Warm, dry without rashes, lesions, ecchymosis.  Neuro: Cranial nerves intact, reflexes equal bilaterally. Normal muscle tone, no cerebellar symptoms. Sensation intact.  Psych: Awake and oriented X 3, normal affect, Insight and Judgment appropriate.   Quentin Mulling 9:23 AM

## 2013-07-24 NOTE — Telephone Encounter (Signed)
Spoke to patient. She states that [facility] has been sending prescription for refill. RN unable to locate faxed information.Patient states SHE NEED REFILLS ON FENOFRATE, VERAPAMIL 240MG  AND SPIROLACTONE. Patients she had another physician's office fill the spirolactone because she was out of medication. Patient request RN to call and refill the other medications. RN called [facility] -spoke to Baldo Ash ,pharmacist--  Baldo Ash states there has been several faxes to the office without response. RN gave Baldo Ash a new fax number to send [phone], Baldo Ash stated that unable e-send due to being interface with office  RN received fax for fenofibrate 134 mg, Verapamil ER 240 mg(CalanSR). PRESCRIPTION SIGNED BY Dr Tresa Endo AND FAXED.  RN NOTIFIED PATIENT

## 2013-07-25 LAB — URINALYSIS, ROUTINE W REFLEX MICROSCOPIC
BILIRUBIN URINE: NEGATIVE
Glucose, UA: >1000 mg/dL — AB
Hgb urine dipstick: NEGATIVE
Ketones, ur: NEGATIVE mg/dL
Leukocytes, UA: NEGATIVE
Nitrite: NEGATIVE
Protein, ur: NEGATIVE mg/dL
SPECIFIC GRAVITY, URINE: 1.022 (ref 1.005–1.030)
UROBILINOGEN UA: 1 mg/dL (ref 0.0–1.0)
pH: 6.5 (ref 5.0–8.0)

## 2013-07-25 LAB — MICROALBUMIN / CREATININE URINE RATIO
CREATININE, URINE: 148.6 mg/dL
MICROALB UR: 0.69 mg/dL (ref 0.00–1.89)
Microalb Creat Ratio: 4.6 mg/g (ref 0.0–30.0)

## 2013-07-25 LAB — URINALYSIS, MICROSCOPIC ONLY: Crystals: NONE SEEN

## 2013-07-25 LAB — VITAMIN D 25 HYDROXY (VIT D DEFICIENCY, FRACTURES): Vit D, 25-Hydroxy: 46 ng/mL (ref 30–89)

## 2013-07-26 LAB — HEPATITIS B E ANTIBODY: HEPATITIS BE ANTIBODY: NONREACTIVE

## 2013-08-16 ENCOUNTER — Encounter: Payer: Self-pay | Admitting: Physician Assistant

## 2013-08-16 ENCOUNTER — Ambulatory Visit (INDEPENDENT_AMBULATORY_CARE_PROVIDER_SITE_OTHER): Payer: PRIVATE HEALTH INSURANCE | Admitting: Physician Assistant

## 2013-08-16 VITALS — BP 122/72 | HR 76 | Temp 97.7°F | Resp 16 | Wt 226.0 lb

## 2013-08-16 DIAGNOSIS — R599 Enlarged lymph nodes, unspecified: Secondary | ICD-10-CM

## 2013-08-16 DIAGNOSIS — R590 Localized enlarged lymph nodes: Secondary | ICD-10-CM

## 2013-08-16 LAB — CBC WITH DIFFERENTIAL/PLATELET
BASOS ABS: 0 10*3/uL (ref 0.0–0.1)
Basophils Relative: 0 % (ref 0–1)
EOS ABS: 0.1 10*3/uL (ref 0.0–0.7)
EOS PCT: 1 % (ref 0–5)
HCT: 40.8 % (ref 36.0–46.0)
Hemoglobin: 14.2 g/dL (ref 12.0–15.0)
Lymphocytes Relative: 35 % (ref 12–46)
Lymphs Abs: 3.3 10*3/uL (ref 0.7–4.0)
MCH: 30.6 pg (ref 26.0–34.0)
MCHC: 34.8 g/dL (ref 30.0–36.0)
MCV: 87.9 fL (ref 78.0–100.0)
Monocytes Absolute: 0.7 10*3/uL (ref 0.1–1.0)
Monocytes Relative: 7 % (ref 3–12)
NEUTROS PCT: 57 % (ref 43–77)
Neutro Abs: 5.4 10*3/uL (ref 1.7–7.7)
Platelets: 331 10*3/uL (ref 150–400)
RBC: 4.64 MIL/uL (ref 3.87–5.11)
RDW: 14.1 % (ref 11.5–15.5)
WBC: 9.4 10*3/uL (ref 4.0–10.5)

## 2013-08-16 NOTE — Patient Instructions (Signed)
Lymphadenopathy °Lymphadenopathy means "disease of the lymph glands." But the term is usually used to describe swollen or enlarged lymph glands, also called lymph nodes. These are the bean-shaped organs found in many locations including the neck, underarm, and groin. Lymph glands are part of the immune system, which fights infections in your body. Lymphadenopathy can occur in just one area of the body, such as the neck, or it can be generalized, with lymph node enlargement in several areas. The nodes found in the neck are the most common sites of lymphadenopathy. °CAUSES  °When your immune system responds to germs (such as viruses or bacteria ), infection-fighting cells and fluid build up. This causes the glands to grow in size. This is usually not something to worry about. Sometimes, the glands themselves can become infected and inflamed. This is called lymphadenitis. °Enlarged lymph nodes can be caused by many diseases: °· Bacterial disease, such as strep throat or a skin infection. °· Viral disease, such as a common cold. °· Other germs, such as lyme disease, tuberculosis, or sexually transmitted diseases. °· Cancers, such as lymphoma (cancer of the lymphatic system) or leukemia (cancer of the white blood cells). °· Inflammatory diseases such as lupus or rheumatoid arthritis. °· Reactions to medications. °Many of the diseases above are rare, but important. This is why you should see your caregiver if you have lymphadenopathy. °SYMPTOMS  °· Swollen, enlarged lumps in the neck, back of the head or other locations. °· Tenderness. °· Warmth or redness of the skin over the lymph nodes. °· Fever. °DIAGNOSIS  °Enlarged lymph nodes are often near the source of infection. They can help healthcare providers diagnose your illness. For instance:  °· Swollen lymph nodes around the jaw might be caused by an infection in the mouth. °· Enlarged glands in the neck often signal a throat infection. °· Lymph nodes that are swollen  in more than one area often indicate an illness caused by a virus. °Your caregiver most likely will know what is causing your lymphadenopathy after listening to your history and examining you. Blood tests, x-rays or other tests may be needed. If the cause of the enlarged lymph node cannot be found, and it does not go away by itself, then a biopsy may be needed. Your caregiver will discuss this with you. °TREATMENT  °Treatment for your enlarged lymph nodes will depend on the cause. Many times the nodes will shrink to normal size by themselves, with no treatment. Antibiotics or other medicines may be needed for infection. Only take over-the-counter or prescription medicines for pain, discomfort or fever as directed by your caregiver. °HOME CARE INSTRUCTIONS  °Swollen lymph glands usually return to normal when the underlying medical condition goes away. If they persist, contact your health-care provider. He/she might prescribe antibiotics or other treatments, depending on the diagnosis. Take any medications exactly as prescribed. Keep any follow-up appointments made to check on the condition of your enlarged nodes.  °SEEK MEDICAL CARE IF:  °· Swelling lasts for more than two weeks. °· You have symptoms such as weight loss, night sweats, fatigue or fever that does not go away. °· The lymph nodes are hard, seem fixed to the skin or are growing rapidly. °· Skin over the lymph nodes is red and inflamed. This could mean there is an infection. °SEEK IMMEDIATE MEDICAL CARE IF:  °· Fluid starts leaking from the area of the enlarged lymph node. °· You develop a fever of 102° F (38.9° C) or greater. °· Severe   pain develops (not necessarily at the site of a large lymph node). °· You develop chest pain or shortness of breath. °· You develop worsening abdominal pain. °MAKE SURE YOU:  °· Understand these instructions. °· Will watch your condition. °· Will get help right away if you are not doing well or get worse. °Document  Released: 12/02/2007 Document Revised: 05/17/2011 Document Reviewed: 12/02/2007 °ExitCare® Patient Information ©2014 [employer]. ° °

## 2013-08-16 NOTE — Progress Notes (Signed)
   Subjective:    Patient ID: [name], female    DOB: May 23, 1955, 58 y.o.   MRN: [id number]  HPI 58 y.o. former smoking female presents for swollen glands. She states this AM she was driving from breakfast when she started to have right throat swelling, with pressure, trouble swallowing, and hard to turn her head within . Denies sore throats, chills, fever, cough, SOB.    Review of Systems  Constitutional: Negative.   HENT: Positive for trouble swallowing. Negative for congestion, dental problem, drooling, ear discharge, ear pain, facial swelling, hearing loss, mouth sores, nosebleeds, postnasal drip, rhinorrhea, sinus pressure, sneezing and sore throat.   Respiratory: Negative.   Cardiovascular: Negative.   Gastrointestinal: Negative.   Genitourinary: Negative.   Musculoskeletal: Negative.   Neurological: Negative.   Psychiatric/Behavioral: Negative.        Objective:   Physical Exam  Constitutional: She appears well-developed and well-nourished.  HENT:  Head: Normocephalic and atraumatic.  Eyes: Conjunctivae are normal. Pupils are equal, round, and reactive to light.  Neck: Normal range of motion. Neck supple.    Cardiovascular: Normal rate and regular rhythm.   Pulmonary/Chest: Effort normal and breath sounds normal.  Abdominal: Soft. Bowel sounds are normal.  Musculoskeletal: Normal range of motion.  Lymphadenopathy:    She has cervical adenopathy.  Skin: Skin is warm and dry.      Assessment & Plan:  Lymphadenopathy-  Check CBC, CMET, former smoker, she would prefer to get an [place] first rather than CT, do NSAIDS

## 2013-08-17 ENCOUNTER — Ambulatory Visit
Admission: RE | Admit: 2013-08-17 | Discharge: 2013-08-17 | Disposition: A | Discharge status: Home / Self Care | Payer: PRIVATE HEALTH INSURANCE | Source: Ambulatory Visit | Attending: Physician Assistant | Admitting: Physician Assistant

## 2013-08-17 DIAGNOSIS — R590 Localized enlarged lymph nodes: Secondary | ICD-10-CM

## 2013-08-17 LAB — COMPREHENSIVE METABOLIC PANEL
ALT: 22 U/L (ref 0–35)
AST: 19 U/L (ref 0–37)
Albumin: 4.2 g/dL (ref 3.5–5.2)
Alkaline Phosphatase: 69 U/L (ref 39–117)
BILIRUBIN TOTAL: 0.4 mg/dL (ref 0.2–1.2)
BUN: 14 mg/dL (ref 6–23)
CO2: 28 mEq/L (ref 19–32)
CREATININE: 0.79 mg/dL (ref 0.50–1.10)
Calcium: 9.8 mg/dL (ref 8.4–10.5)
Chloride: 95 mEq/L — ABNORMAL LOW (ref 96–112)
Glucose, Bld: 301 mg/dL — ABNORMAL HIGH (ref 70–99)
Potassium: 4.6 mEq/L (ref 3.5–5.3)
SODIUM: 132 meq/L — AB (ref 135–145)
TOTAL PROTEIN: 7.2 g/dL (ref 6.0–8.3)

## 2013-08-20 ENCOUNTER — Other Ambulatory Visit: Payer: Self-pay

## 2013-08-20 DIAGNOSIS — R599 Enlarged lymph nodes, unspecified: Secondary | ICD-10-CM

## 2013-08-23 ENCOUNTER — Telehealth: Payer: Self-pay | Admitting: Physician Assistant

## 2013-08-23 ENCOUNTER — Other Ambulatory Visit: Payer: Self-pay | Admitting: Physician Assistant

## 2013-08-23 DIAGNOSIS — Z87891 Personal history of nicotine dependence: Secondary | ICD-10-CM

## 2013-08-23 DIAGNOSIS — E871 Hypo-osmolality and hyponatremia: Secondary | ICD-10-CM

## 2013-08-23 NOTE — Telephone Encounter (Signed)
Patient states she has been having leg cramps. No weakness/confusion. She was told to stop one of her diuretics for 1-3 days, if any worsening confusion/weakness/HA/AB pain/SOB/CP go to ER. We will also get a CXR due to her history of smoking, low sodium, and right facial swelling. She is seeing Dr. Ezzard Standing this coming Tuesday.

## 2013-08-24 ENCOUNTER — Telehealth (HOSPITAL_COMMUNITY): Payer: Self-pay | Admitting: *Deleted

## 2013-08-31 ENCOUNTER — Ambulatory Visit (HOSPITAL_BASED_OUTPATIENT_CLINIC_OR_DEPARTMENT_OTHER)
Admission: RE | Admit: 2013-08-31 | Discharge: 2013-08-31 | Disposition: A | Discharge status: Home / Self Care | Payer: PRIVATE HEALTH INSURANCE | Source: Ambulatory Visit | Attending: Physician Assistant | Admitting: Physician Assistant

## 2013-08-31 DIAGNOSIS — Z87891 Personal history of nicotine dependence: Secondary | ICD-10-CM | POA: Insufficient documentation

## 2013-08-31 DIAGNOSIS — I509 Heart failure, unspecified: Secondary | ICD-10-CM | POA: Insufficient documentation

## 2013-08-31 DIAGNOSIS — Z950 Presence of cardiac pacemaker: Secondary | ICD-10-CM | POA: Insufficient documentation

## 2013-08-31 DIAGNOSIS — E871 Hypo-osmolality and hyponatremia: Secondary | ICD-10-CM

## 2013-09-04 ENCOUNTER — Ambulatory Visit (INDEPENDENT_AMBULATORY_CARE_PROVIDER_SITE_OTHER): Payer: PRIVATE HEALTH INSURANCE | Admitting: *Deleted

## 2013-09-04 ENCOUNTER — Encounter: Payer: Self-pay | Admitting: Internal Medicine

## 2013-09-04 DIAGNOSIS — I5022 Chronic systolic (congestive) heart failure: Secondary | ICD-10-CM

## 2013-09-04 DIAGNOSIS — I428 Other cardiomyopathies: Secondary | ICD-10-CM

## 2013-09-04 LAB — MDC_IDC_ENUM_SESS_TYPE_REMOTE
Battery Voltage: 2.51 V
Brady Statistic AP VP Percent: 2.2 %
Brady Statistic AP VS Percent: 1 %
Brady Statistic AS VP Percent: 98 %
Brady Statistic RA Percent Paced: 2.3 %
Date Time Interrogation Session: 20150630061940
HighPow Impedance: 82 Ohm
HighPow Impedance: 82 Ohm
Lead Channel Impedance Value: 400 Ohm
Lead Channel Impedance Value: 400 Ohm
Lead Channel Impedance Value: 460 Ohm
Lead Channel Pacing Threshold Amplitude: 0.5 V
Lead Channel Pacing Threshold Amplitude: 0.75 V
Lead Channel Pacing Threshold Pulse Width: 0.4 ms
Lead Channel Pacing Threshold Pulse Width: 0.4 ms
Lead Channel Sensing Intrinsic Amplitude: 3.9 mV
Lead Channel Setting Pacing Amplitude: 2 V
Lead Channel Setting Pacing Amplitude: 2.5 V
Lead Channel Setting Sensing Sensitivity: 0.3 mV
MDC IDC MSMT BATTERY REMAINING LONGEVITY: 5 mo
MDC IDC MSMT LEADCHNL RA PACING THRESHOLD AMPLITUDE: 0.5 V
MDC IDC MSMT LEADCHNL RA PACING THRESHOLD PULSEWIDTH: 0.4 ms
MDC IDC MSMT LEADCHNL RV SENSING INTR AMPL: 5 mV
MDC IDC PG SERIAL: 540466
MDC IDC SET LEADCHNL LV PACING PULSEWIDTH: 0.4 ms
MDC IDC SET LEADCHNL RV PACING AMPLITUDE: 2.5 V
MDC IDC SET LEADCHNL RV PACING PULSEWIDTH: 0.4 ms
MDC IDC SET ZONE DETECTION INTERVAL: 250 ms
MDC IDC SET ZONE DETECTION INTERVAL: 300 ms
MDC IDC STAT BRADY AS VS PERCENT: 1 %
Zone Setting Detection Interval: 330 ms

## 2013-09-04 NOTE — Progress Notes (Signed)
Remote ICD transmission.   

## 2013-09-05 ENCOUNTER — Ambulatory Visit (HOSPITAL_COMMUNITY)
Admission: RE | Admit: 2013-09-05 | Discharge: 2013-09-05 | Disposition: A | Discharge status: Home / Self Care | Payer: PRIVATE HEALTH INSURANCE | Source: Ambulatory Visit | Attending: Cardiovascular Disease | Admitting: Cardiovascular Disease

## 2013-09-05 DIAGNOSIS — I5022 Chronic systolic (congestive) heart failure: Secondary | ICD-10-CM | POA: Insufficient documentation

## 2013-09-05 DIAGNOSIS — I319 Disease of pericardium, unspecified: Secondary | ICD-10-CM

## 2013-09-05 DIAGNOSIS — I10 Essential (primary) hypertension: Secondary | ICD-10-CM | POA: Insufficient documentation

## 2013-09-05 NOTE — Progress Notes (Signed)
2D Echo Performed 09/05/2013    Alphonza Tramell, RCS  

## 2013-09-20 ENCOUNTER — Telehealth: Payer: Self-pay | Admitting: *Deleted

## 2013-09-20 NOTE — Telephone Encounter (Signed)
Message copied by Marella Bile on Thu Sep 20, 2013  2:30 PM ------      Message from: Nicki Guadalajara A      Created: Fri Sep 14, 2013  1:08 PM       Nl ef; mild lvh; small insig pericardial effusion ------

## 2013-09-24 ENCOUNTER — Encounter: Payer: Self-pay | Admitting: Cardiology

## 2013-09-24 NOTE — Telephone Encounter (Signed)
Spoke with patient and gave results. 

## 2013-10-30 ENCOUNTER — Other Ambulatory Visit: Payer: Self-pay | Admitting: Physician Assistant

## 2013-11-02 ENCOUNTER — Ambulatory Visit: Payer: Self-pay | Admitting: Physician Assistant

## 2013-11-28 ENCOUNTER — Ambulatory Visit: Payer: PRIVATE HEALTH INSURANCE | Admitting: Cardiovascular Disease

## 2013-12-11 ENCOUNTER — Ambulatory Visit: Payer: Self-pay | Admitting: Physician Assistant

## 2013-12-14 ENCOUNTER — Encounter: Payer: Self-pay | Admitting: *Deleted

## 2013-12-17 ENCOUNTER — Telehealth: Payer: Self-pay | Admitting: Internal Medicine

## 2013-12-17 NOTE — Telephone Encounter (Signed)
Spoke w/pt--device started vibrating on 12-14-13. Pt out of town and will return 12-18-13. Pt to send manual transmission once home and was scheduled for GT on 01-03-14 @ 900.

## 2013-12-17 NOTE — Telephone Encounter (Signed)
New message          Pt states she talked with [name] over the weekend concerning her pace maker and she told her that someone from our office will contact her to do a remote interrogation and also bring her into the office for an appt on Tuesday afternoon / please give pt a call

## 2013-12-19 ENCOUNTER — Telehealth: Payer: Self-pay | Admitting: Internal Medicine

## 2013-12-19 NOTE — Telephone Encounter (Signed)
Spoke with pt and informed her that we did not receive transmission. Instructed pt to call tech services b/c her home monitor started blinking orange and all five lights and she needs tech services to help trouble shoot the monitor she verbalized understanding.

## 2013-12-19 NOTE — Telephone Encounter (Signed)
New message     Did you get her device transmission from yesterday?

## 2013-12-20 ENCOUNTER — Telehealth: Payer: Self-pay | Admitting: Internal Medicine

## 2013-12-20 NOTE — Telephone Encounter (Signed)
Pt called [name] but transmitter is not working and has not been working since 09-17-13. Pt has stomach bug today so unable to come in for device check. Pt scheduled for 12-24-13 at 830 since pt works in [employer]. Pt to call if not feeling better to reschedule appointment.

## 2013-12-20 NOTE — Telephone Encounter (Signed)
New message     Pt called merlin last night---talk to [facility] to tell her what they said

## 2013-12-24 ENCOUNTER — Ambulatory Visit (INDEPENDENT_AMBULATORY_CARE_PROVIDER_SITE_OTHER): Payer: PRIVATE HEALTH INSURANCE | Admitting: *Deleted

## 2013-12-24 DIAGNOSIS — I428 Other cardiomyopathies: Secondary | ICD-10-CM

## 2013-12-24 DIAGNOSIS — I5022 Chronic systolic (congestive) heart failure: Secondary | ICD-10-CM

## 2013-12-24 DIAGNOSIS — I429 Cardiomyopathy, unspecified: Secondary | ICD-10-CM

## 2013-12-24 LAB — MDC_IDC_ENUM_SESS_TYPE_INCLINIC
Battery Remaining Longevity: 0 mo
Battery Voltage: 2.42 V
Brady Statistic RA Percent Paced: 1.9 %
HighPow Impedance: 76.5 Ohm
Implantable Pulse Generator Serial Number: 540466
Lead Channel Impedance Value: 437.5 Ohm
Lead Channel Impedance Value: 450 Ohm
Lead Channel Pacing Threshold Amplitude: 0.75 V
Lead Channel Pacing Threshold Amplitude: 0.75 V
Lead Channel Pacing Threshold Amplitude: 0.75 V
Lead Channel Pacing Threshold Amplitude: 0.75 V
Lead Channel Pacing Threshold Amplitude: 0.75 V
Lead Channel Pacing Threshold Pulse Width: 0.4 ms
Lead Channel Pacing Threshold Pulse Width: 0.4 ms
Lead Channel Pacing Threshold Pulse Width: 0.4 ms
Lead Channel Sensing Intrinsic Amplitude: 3.5 mV
Lead Channel Setting Pacing Amplitude: 2.5 V
Lead Channel Setting Pacing Pulse Width: 0.4 ms
Lead Channel Setting Sensing Sensitivity: 0.3 mV
MDC IDC MSMT LEADCHNL LV PACING THRESHOLD AMPLITUDE: 0.75 V
MDC IDC MSMT LEADCHNL LV PACING THRESHOLD PULSEWIDTH: 0.4 ms
MDC IDC MSMT LEADCHNL RA IMPEDANCE VALUE: 375 Ohm
MDC IDC MSMT LEADCHNL RA PACING THRESHOLD PULSEWIDTH: 0.4 ms
MDC IDC MSMT LEADCHNL RA PACING THRESHOLD PULSEWIDTH: 0.4 ms
MDC IDC MSMT LEADCHNL RV SENSING INTR AMPL: 10 mV
MDC IDC SESS DTM: 20151019084244
MDC IDC SET LEADCHNL LV PACING AMPLITUDE: 2.5 V
MDC IDC SET LEADCHNL LV PACING PULSEWIDTH: 0.4 ms
MDC IDC SET LEADCHNL RA PACING AMPLITUDE: 2 V
MDC IDC SET ZONE DETECTION INTERVAL: 250 ms
MDC IDC SET ZONE DETECTION INTERVAL: 300 ms
MDC IDC STAT BRADY RV PERCENT PACED: 99.85 %
Zone Setting Detection Interval: 330 ms

## 2013-12-24 NOTE — Progress Notes (Signed)
CRT-D device check in office. Thresholds and sensing consistent with previous device measurements. Lead impedance trends stable over time. No mode switch episodes recorded. No ventricular arrhythmia episodes recorded. Patient bi-ventricularly pacing >100 % of the time. Device programmed with appropriate safety margins. Heart failure diagnostics reviewed and trends are stable for patient.  Audible/vibratory alerts off, device @ [facility] since 12/14/13.   No changes made this session.  Patient education completed including shock plan.  ROV 10/29 @ 9am with Dr. Ladona Ridgel.

## 2014-01-03 ENCOUNTER — Encounter: Payer: Self-pay | Admitting: *Deleted

## 2014-01-03 ENCOUNTER — Ambulatory Visit (INDEPENDENT_AMBULATORY_CARE_PROVIDER_SITE_OTHER): Payer: PRIVATE HEALTH INSURANCE | Admitting: Internal Medicine

## 2014-01-03 ENCOUNTER — Encounter: Payer: Self-pay | Admitting: Internal Medicine

## 2014-01-03 VITALS — BP 128/80 | HR 85 | Ht 67.0 in | Wt 223.0 lb

## 2014-01-03 DIAGNOSIS — I429 Cardiomyopathy, unspecified: Secondary | ICD-10-CM

## 2014-01-03 DIAGNOSIS — I10 Essential (primary) hypertension: Secondary | ICD-10-CM

## 2014-01-03 DIAGNOSIS — I5022 Chronic systolic (congestive) heart failure: Secondary | ICD-10-CM

## 2014-01-03 DIAGNOSIS — I428 Other cardiomyopathies: Secondary | ICD-10-CM

## 2014-01-03 DIAGNOSIS — Z95810 Presence of automatic (implantable) cardiac defibrillator: Secondary | ICD-10-CM

## 2014-01-03 LAB — BASIC METABOLIC PANEL
BUN: 15 mg/dL (ref 6–23)
CHLORIDE: 100 meq/L (ref 96–112)
CO2: 20 meq/L (ref 19–32)
Calcium: 9.6 mg/dL (ref 8.4–10.5)
Creatinine, Ser: 0.9 mg/dL (ref 0.4–1.2)
GFR: 65.08 mL/min (ref >60.00)
Glucose, Bld: 267 mg/dL — ABNORMAL HIGH (ref 70–99)
Potassium: 4.1 mEq/L (ref 3.5–5.1)
Sodium: 133 mEq/L — ABNORMAL LOW (ref 135–145)

## 2014-01-03 LAB — CBC WITH DIFFERENTIAL/PLATELET
BASOS PCT: 0.4 % (ref 0.0–3.0)
Basophils Absolute: 0 10*3/uL (ref 0.0–0.1)
EOS PCT: 2.5 % (ref 0.0–5.0)
Eosinophils Absolute: 0.2 10*3/uL (ref 0.0–0.7)
HCT: 41.5 % (ref 36.0–46.0)
Hemoglobin: 13.8 g/dL (ref 12.0–15.0)
Lymphocytes Relative: 34.9 % (ref 12.0–46.0)
Lymphs Abs: 3.2 10*3/uL (ref 0.7–4.0)
MCHC: 33.3 g/dL (ref 30.0–36.0)
MCV: 91.9 fl (ref 78.0–100.0)
MONO ABS: 0.6 10*3/uL (ref 0.1–1.0)
Monocytes Relative: 6.2 % (ref 3.0–12.0)
NEUTROS ABS: 5.1 10*3/uL (ref 1.4–7.7)
Neutrophils Relative %: 56 % (ref 43.0–77.0)
Platelets: 351 10*3/uL (ref 150.0–400.0)
RBC: 4.51 Mil/uL (ref 3.87–5.11)
RDW: 13.1 % (ref 11.5–15.5)
WBC: 9 10*3/uL (ref 4.0–10.5)

## 2014-01-03 NOTE — Progress Notes (Signed)
HPI [name] returns today for followup. She is a very pleasant 58 year old woman with a nonischemic cardiomyopathy, chronic systolic heart failure, and left bundle branch block, who underwent biventricular ICD implantation and had normalization of her left ventricular function. In the interim she has done well. She denies chest pain, shortness of breath, or peripheral edema. She is bothered today by a migraine headache. Her only complaints include hot flashes.  Allergies  Allergen Reactions  . Flagyl [Metronidazole]   . Niacin And Related Cough    flushing  . Penicillins Swelling     Current Outpatient Prescriptions  Medication Sig Dispense Refill  . aspirin 81 MG tablet Take 81 mg by mouth daily. Takes 2      . carvedilol (COREG) 25 MG tablet Take 25 mg by mouth 2 (two) times daily with a meal.       . diazepam (VALIUM) 5 MG tablet Take 1 tablet (5 mg total) by mouth at bedtime as needed for anxiety.  30 tablet  0  . EPINEPHrine (EPI-PEN) 0.3 mg/0.3 mL SOAJ injection Inject into the muscle once.      Marland Kitchen estradiol (ESTRACE) 2 MG tablet Take 2 mg by mouth daily.      . fenofibrate micronized (LOFIBRA) 134 MG capsule Take 1 capsule (134 mg total) by mouth daily before breakfast.  90 capsule  3  . imipramine (TOFRANIL) 25 MG tablet TAKE 1 TO 2 TABLETS BY MOUTH EVERY DAY  180 tablet  PRN  . insulin aspart protamine- aspart (NOVOLOG MIX 70/30) (70-30) 100 UNIT/ML injection Inject into the skin. 25 units am 30 units pm      . loratadine (CLARITIN) 10 MG tablet Take 10 mg by mouth daily as needed for allergies.      Marland Kitchen losartan (COZAAR) 100 MG tablet Take 1 tablet (100 mg total) by mouth daily.  90 tablet  3  . metFORMIN (GLUCOPHAGE) 1000 MG tablet Take 1,000 mg by mouth 2 (two) times daily with a meal.        . pantoprazole (PROTONIX) 40 MG tablet Take 40 mg by mouth as needed.       . Potassium 99 MG TABS Take 1 tablet by mouth daily.      . rizatriptan (MAXALT) 10 MG tablet Take 10 mg by  mouth as needed for migraine. May repeat in 2 hours if needed      . rosuvastatin (CRESTOR) 40 MG tablet Take 20 mg by mouth every other day.      . spironolactone (ALDACTONE) 25 MG tablet Take 1 tablet (25 mg total) by mouth 2 (two) times daily.  180 tablet  1  . torsemide (DEMADEX) 20 MG tablet Take 20 mg by mouth every other day.      . verapamil (CALAN-SR) 240 MG CR tablet Take 1 tablet (240 mg total) by mouth at bedtime.  90 tablet  3   No current facility-administered medications for this visit.     Past Medical History  Diagnosis Date  . Hyperlipidemia   . Hypertension 06/25/2011    Renal dopplers - superior mesenteric artery >50% diameter reduction; R renal artery - normal patency; L proximal renal artery 1-59% reduction (lower end of scale); both kidneys normal in size/symmetry with normal cortex and medulla  . Diabetes mellitus   . Automatic implantable cardiac defibrillator in situ     ST[employer] JUDE MODEL 7122  . Unspecified sleep apnea   . Leg pain 12/09/2010    doppler of R  femoral artery - no evidence of dissection, AV fistula, pseudoaneurysm or other vascular abnormalities  . Sleep apnea     on CPAP - AHI during sleep study was 44  . Obesity   . Type II or unspecified type diabetes mellitus without mention of complication, not stated as uncontrolled   . Type II or unspecified type diabetes mellitus without mention of complication, not stated as uncontrolled   . CHF (congestive heart failure)   . Migraines     ROS:   All systems reviewed and negative except as noted in the HPI.   Past Surgical History  Procedure Laterality Date  . Abdominal hysterectomy    . Neck surgery    . Pacemaker insertion    . Cardiac defibrillator placement      ICD by Dr. Ladona Trujillo  . Knee surgery    . Doppler echocardiography  06/25/2011    EF >55%; moderate concentric LV hypertrophy; LA mildly dilated, mild mitral annular calcification;   . Cardiovascular stress test  08/28/2010    R/P MV -  pattern of normal perfusion in all regions, no scintigraphic evidence of inducible myocardial ischemia; no EKG changes; normal perfusion study; pt did experience chesst pain during strudy, resolved spontaneously  . Cardiac catheterization  07/15/2006    no significant CAD by cardiac cath, confirmed by intravascular US of LAD; non-ischemic cardiomyopathy prob related to uncontrolled hypertension, DM and morbid obesity; moderate pulmonary hypertension; preserved cardiac output and cardiac index     Family History  Problem Relation Age of Onset  . Heart attack Father      History   Social History  . Marital Status: Married    Spouse Name: N/A    Number of Children: N/A  . Years of Education: N/A   Occupational History  . Not on file.   Social History Main Topics  . Smoking status: Former Smoker    Types: Cigarettes    Quit date: 03/09/1975  . Smokeless tobacco: Not on file  . Alcohol Use: Yes  . Drug Use: No  . Sexual Activity: Not on file   Other Topics Concern  . 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     BP 128/80  Pulse 85  Ht 5\' 7"  (1.702 m)  Wt 223 lb (101.152 kg)  BMI 34.92 kg/m2  Physical Exam:  Well appearing middle-age woman, NAD HEENT: Unremarkable Neck:  6 cm JVD, no thyromegally Back:  No CVA tenderness Lungs:  Clear with no wheezes, rales, or rhonchi. well-healed ICD incision HEART:  Regular rate rhythm, grade 1/6 systolic murmur, no rubs, no clicks Abd:  soft, obese, positive bowel sounds, no organomegally, no rebound, no guarding Ext:  2 plus pulses, no edema, no cyanosis, no clubbing Skin:  No rashes no nodules Neuro:  CN II through XII intact, motor grossly intact  DEVICE  Normal device function.  See PaceArt for details. [facility]  Assess/Plan:

## 2014-01-03 NOTE — Assessment & Plan Note (Signed)
Her device is at elective replacement, 6 years after implant. Because of normalization of her left ventricular function, we'll plan to change out her ICD in place a biventricular pacemaker.

## 2014-01-03 NOTE — Assessment & Plan Note (Addendum)
Her EF has normalized with BiV pacing. She has never had an ICD shock. Will plan to change out her ICD which is at [facility] and place a BiV PPM. I've discussed the risk, goals, benefits, and expectations of the procedure, and she wishes to proceed. Her chronic systolic heart failure remains well compensated class IIA.

## 2014-01-03 NOTE — Assessment & Plan Note (Signed)
Her blood pressure is well controlled. She is encouraged to lose weight, and maintain a low-sodium diet.

## 2014-01-03 NOTE — Patient Instructions (Addendum)
Call back to have generator replaced in device.  Your physician recommends that you schedule a follow-up appointment in: 7-10 days after 01/16/14 in device clinic for wound check  Will change out to a Bi-V pacemaker--See instruction sheet for procedure  11/11,11/16,11/18,11/23,12/2,12/4,12/7,12/9,02/1111/14,12/16,12/28,02/2911/30,12/31  Thank you for choosing [facility]!!     Dennis Bast, RN   [phone]

## 2014-01-14 ENCOUNTER — Encounter: Payer: Self-pay | Admitting: Internal Medicine

## 2014-01-15 DIAGNOSIS — E785 Hyperlipidemia, unspecified: Secondary | ICD-10-CM | POA: Diagnosis not present

## 2014-01-15 DIAGNOSIS — G4730 Sleep apnea, unspecified: Secondary | ICD-10-CM | POA: Diagnosis not present

## 2014-01-15 DIAGNOSIS — E119 Type 2 diabetes mellitus without complications: Secondary | ICD-10-CM | POA: Diagnosis not present

## 2014-01-15 DIAGNOSIS — Z880 Allergy status to penicillin: Secondary | ICD-10-CM | POA: Diagnosis not present

## 2014-01-15 DIAGNOSIS — Z950 Presence of cardiac pacemaker: Secondary | ICD-10-CM | POA: Diagnosis not present

## 2014-01-15 DIAGNOSIS — I429 Cardiomyopathy, unspecified: Secondary | ICD-10-CM | POA: Diagnosis not present

## 2014-01-15 DIAGNOSIS — I5022 Chronic systolic (congestive) heart failure: Secondary | ICD-10-CM | POA: Diagnosis not present

## 2014-01-15 DIAGNOSIS — G43909 Migraine, unspecified, not intractable, without status migrainosus: Secondary | ICD-10-CM | POA: Diagnosis not present

## 2014-01-15 DIAGNOSIS — Z794 Long term (current) use of insulin: Secondary | ICD-10-CM | POA: Diagnosis not present

## 2014-01-15 DIAGNOSIS — Z87891 Personal history of nicotine dependence: Secondary | ICD-10-CM | POA: Diagnosis not present

## 2014-01-15 DIAGNOSIS — I447 Left bundle-branch block, unspecified: Secondary | ICD-10-CM | POA: Diagnosis present

## 2014-01-15 DIAGNOSIS — I10 Essential (primary) hypertension: Secondary | ICD-10-CM | POA: Diagnosis not present

## 2014-01-15 DIAGNOSIS — E669 Obesity, unspecified: Secondary | ICD-10-CM | POA: Diagnosis not present

## 2014-01-15 DIAGNOSIS — Z6834 Body mass index (BMI) 34.0-34.9, adult: Secondary | ICD-10-CM | POA: Diagnosis not present

## 2014-01-15 DIAGNOSIS — Z7982 Long term (current) use of aspirin: Secondary | ICD-10-CM | POA: Diagnosis not present

## 2014-01-15 MED ORDER — MUPIROCIN 2 % EX OINT
1.0000 "application " | TOPICAL_OINTMENT | Freq: Once | CUTANEOUS | Status: AC
  Administered 2014-01-16: 1 via TOPICAL
  Filled 2014-01-15: qty 22

## 2014-01-15 MED ORDER — SODIUM CHLORIDE 0.9 % IV SOLN
1500.0000 mg | INTRAVENOUS | Status: DC
  Filled 2014-01-15: qty 1500

## 2014-01-15 MED ORDER — SODIUM CHLORIDE 0.9 % IR SOLN
80.0000 mg | Status: DC
  Filled 2014-01-15: qty 2

## 2014-01-16 ENCOUNTER — Encounter (HOSPITAL_COMMUNITY)
Admission: RE | Disposition: A | Discharge status: Home / Self Care | Payer: Self-pay | Source: Ambulatory Visit | Attending: Internal Medicine

## 2014-01-16 ENCOUNTER — Ambulatory Visit (HOSPITAL_COMMUNITY)
Admission: RE | Admit: 2014-01-16 | Discharge: 2014-01-16 | Disposition: A | Discharge status: Home / Self Care | Payer: PRIVATE HEALTH INSURANCE | Source: Ambulatory Visit | Attending: Internal Medicine | Admitting: Internal Medicine

## 2014-01-16 DIAGNOSIS — I447 Left bundle-branch block, unspecified: Secondary | ICD-10-CM

## 2014-01-16 DIAGNOSIS — E669 Obesity, unspecified: Secondary | ICD-10-CM | POA: Insufficient documentation

## 2014-01-16 DIAGNOSIS — G43909 Migraine, unspecified, not intractable, without status migrainosus: Secondary | ICD-10-CM | POA: Insufficient documentation

## 2014-01-16 DIAGNOSIS — Z880 Allergy status to penicillin: Secondary | ICD-10-CM | POA: Insufficient documentation

## 2014-01-16 DIAGNOSIS — I429 Cardiomyopathy, unspecified: Secondary | ICD-10-CM | POA: Insufficient documentation

## 2014-01-16 DIAGNOSIS — Z950 Presence of cardiac pacemaker: Secondary | ICD-10-CM | POA: Diagnosis present

## 2014-01-16 DIAGNOSIS — Z7982 Long term (current) use of aspirin: Secondary | ICD-10-CM | POA: Insufficient documentation

## 2014-01-16 DIAGNOSIS — Z6834 Body mass index (BMI) 34.0-34.9, adult: Secondary | ICD-10-CM | POA: Insufficient documentation

## 2014-01-16 DIAGNOSIS — I428 Other cardiomyopathies: Secondary | ICD-10-CM

## 2014-01-16 DIAGNOSIS — Z794 Long term (current) use of insulin: Secondary | ICD-10-CM | POA: Insufficient documentation

## 2014-01-16 DIAGNOSIS — I5022 Chronic systolic (congestive) heart failure: Secondary | ICD-10-CM

## 2014-01-16 DIAGNOSIS — Z87891 Personal history of nicotine dependence: Secondary | ICD-10-CM | POA: Insufficient documentation

## 2014-01-16 DIAGNOSIS — Z95810 Presence of automatic (implantable) cardiac defibrillator: Secondary | ICD-10-CM

## 2014-01-16 DIAGNOSIS — E119 Type 2 diabetes mellitus without complications: Secondary | ICD-10-CM | POA: Insufficient documentation

## 2014-01-16 DIAGNOSIS — G4730 Sleep apnea, unspecified: Secondary | ICD-10-CM | POA: Insufficient documentation

## 2014-01-16 DIAGNOSIS — E785 Hyperlipidemia, unspecified: Secondary | ICD-10-CM | POA: Insufficient documentation

## 2014-01-16 DIAGNOSIS — I10 Essential (primary) hypertension: Secondary | ICD-10-CM | POA: Insufficient documentation

## 2014-01-16 HISTORY — PX: BIV PACEMAKER GENERATOR CHANGE OUT: SHX5746

## 2014-01-16 LAB — SURGICAL PCR SCREEN
MRSA, PCR: NEGATIVE
Staphylococcus aureus: POSITIVE — AB

## 2014-01-16 LAB — GLUCOSE, CAPILLARY: Glucose-Capillary: 270 mg/dL — ABNORMAL HIGH (ref 70–99)

## 2014-01-16 SURGERY — BIV PACEMAKER GENERATOR CHANGE OUT
Anesthesia: LOCAL

## 2014-01-16 MED ORDER — LIDOCAINE HCL (PF) 1 % IJ SOLN
INTRAMUSCULAR | Status: AC
  Filled 2014-01-16: qty 60

## 2014-01-16 MED ORDER — FENTANYL CITRATE 0.05 MG/ML IJ SOLN
INTRAMUSCULAR | Status: AC
  Filled 2014-01-16: qty 2

## 2014-01-16 MED ORDER — CHLORHEXIDINE GLUCONATE 4 % EX LIQD: 60.0000 mL | Freq: Once | CUTANEOUS | Status: DC

## 2014-01-16 MED ORDER — SODIUM CHLORIDE 0.9 % IV SOLN
INTRAVENOUS | Status: DC
  Administered 2014-01-16: 10:00:00 via INTRAVENOUS

## 2014-01-16 MED ORDER — MIDAZOLAM HCL 5 MG/5ML IJ SOLN
INTRAMUSCULAR | Status: AC
  Filled 2014-01-16: qty 5

## 2014-01-16 MED ORDER — MUPIROCIN 2 % EX OINT
TOPICAL_OINTMENT | CUTANEOUS | Status: AC
  Filled 2014-01-16: qty 22

## 2014-01-16 NOTE — H&P (View-Only) (Signed)
HPI Mrs. Sek returns today for followup. She is a very pleasant 58 year old woman with a nonischemic cardiomyopathy, chronic systolic heart failure, and left bundle branch block, who underwent biventricular ICD implantation and had normalization of her left ventricular function. In the interim she has done well. She denies chest pain, shortness of breath, or peripheral edema. She is bothered today by a migraine headache. Her only complaints include hot flashes.  Allergies  Allergen Reactions  . Flagyl [Metronidazole]   . Niacin And Related Cough    flushing  . Penicillins Swelling     Current Outpatient Prescriptions  Medication Sig Dispense Refill  . aspirin 81 MG tablet Take 81 mg by mouth daily. Takes 2      . carvedilol (COREG) 25 MG tablet Take 25 mg by mouth 2 (two) times daily with a meal.       . diazepam (VALIUM) 5 MG tablet Take 1 tablet (5 mg total) by mouth at bedtime as needed for anxiety.  30 tablet  0  . EPINEPHrine (EPI-PEN) 0.3 mg/0.3 mL SOAJ injection Inject into the muscle once.      Marland Kitchen estradiol (ESTRACE) 2 MG tablet Take 2 mg by mouth daily.      . fenofibrate micronized (LOFIBRA) 134 MG capsule Take 1 capsule (134 mg total) by mouth daily before breakfast.  90 capsule  3  . imipramine (TOFRANIL) 25 MG tablet TAKE 1 TO 2 TABLETS BY MOUTH EVERY DAY  180 tablet  PRN  . insulin aspart protamine- aspart (NOVOLOG MIX 70/30) (70-30) 100 UNIT/ML injection Inject into the skin. 25 units am 30 units pm      . loratadine (CLARITIN) 10 MG tablet Take 10 mg by mouth daily as needed for allergies.      Marland Kitchen losartan (COZAAR) 100 MG tablet Take 1 tablet (100 mg total) by mouth daily.  90 tablet  3  . metFORMIN (GLUCOPHAGE) 1000 MG tablet Take 1,000 mg by mouth 2 (two) times daily with a meal.        . pantoprazole (PROTONIX) 40 MG tablet Take 40 mg by mouth as needed.       . Potassium 99 MG TABS Take 1 tablet by mouth daily.      . rizatriptan (MAXALT) 10 MG tablet Take 10 mg by  mouth as needed for migraine. May repeat in 2 hours if needed      . rosuvastatin (CRESTOR) 40 MG tablet Take 20 mg by mouth every other day.      . spironolactone (ALDACTONE) 25 MG tablet Take 1 tablet (25 mg total) by mouth 2 (two) times daily.  180 tablet  1  . torsemide (DEMADEX) 20 MG tablet Take 20 mg by mouth every other day.      . verapamil (CALAN-SR) 240 MG CR tablet Take 1 tablet (240 mg total) by mouth at bedtime.  90 tablet  3   No current facility-administered medications for this visit.     Past Medical History  Diagnosis Date  . Hyperlipidemia   . Hypertension 06/25/2011    Renal dopplers - superior mesenteric artery >50% diameter reduction; R renal artery - normal patency; L proximal renal artery 1-59% reduction (lower end of scale); both kidneys normal in size/symmetry with normal cortex and medulla  . Diabetes mellitus   . Automatic implantable cardiac defibrillator in situ     ST[employer] JUDE MODEL 7122  . Unspecified sleep apnea   . Leg pain 12/09/2010    doppler of R  femoral artery - no evidence of dissection, AV fistula, pseudoaneurysm or other vascular abnormalities  . Sleep apnea     on CPAP - AHI during sleep study was 44  . Obesity   . Type II or unspecified type diabetes mellitus without mention of complication, not stated as uncontrolled   . Type II or unspecified type diabetes mellitus without mention of complication, not stated as uncontrolled   . CHF (congestive heart failure)   . Migraines     ROS:   All systems reviewed and negative except as noted in the HPI.   Past Surgical History  Procedure Laterality Date  . Abdominal hysterectomy    . Neck surgery    . Pacemaker insertion    . Cardiac defibrillator placement      ICD by Dr. Ladona Ridgelaylor  . Knee surgery    . Doppler echocardiography  06/25/2011    EF >55%; moderate concentric LV hypertrophy; LA mildly dilated, mild mitral annular calcification;   . Cardiovascular stress test  08/28/2010    R/P MV -  pattern of normal perfusion in all regions, no scintigraphic evidence of inducible myocardial ischemia; no EKG changes; normal perfusion study; pt did experience chesst pain during strudy, resolved spontaneously  . Cardiac catheterization  07/15/2006    no significant CAD by cardiac cath, confirmed by intravascular US of LAD; non-ischemic cardiomyopathy prob related to uncontrolled hypertension, DM and morbid obesity; moderate pulmonary hypertension; preserved cardiac output and cardiac index     Family History  Problem Relation Age of Onset  . Heart attack Father      History   Social History  . Marital Status: Married    Spouse Name: N/A    Number of Children: N/A  . Years of Education: N/A   Occupational History  . Not on file.   Social History Main Topics  . Smoking status: Former Smoker    Types: Cigarettes    Quit date: 03/09/1975  . Smokeless tobacco: Not on file  . Alcohol Use: Yes  . Drug Use: No  . Sexual Activity: Not on file   Other Topics Concern  . 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     BP 128/80  Pulse 85  Ht 5\' 7"  (1.702 m)  Wt 223 lb (101.152 kg)  BMI 34.92 kg/m2  Physical Exam:  Well appearing middle-age woman, NAD HEENT: Unremarkable Neck:  6 cm JVD, no thyromegally Back:  No CVA tenderness Lungs:  Clear with no wheezes, rales, or rhonchi. well-healed ICD incision HEART:  Regular rate rhythm, grade 1/6 systolic murmur, no rubs, no clicks Abd:  soft, obese, positive bowel sounds, no organomegally, no rebound, no guarding Ext:  2 plus pulses, no edema, no cyanosis, no clubbing Skin:  No rashes no nodules Neuro:  CN II through XII intact, motor grossly intact  DEVICE  Normal device function.  See PaceArt for details. [facility]  Assess/Plan:

## 2014-01-16 NOTE — Interval H&P Note (Signed)
History and Physical Interval Note:  01/16/2014 9:58 AM  [name]  has presented today for surgery, with the diagnosis of eri/hf  The various methods of treatment have been discussed with the patient and family. After consideration of risks, benefits and other options for treatment, the patient has consented to  Procedure(s): BIV PACEMAKER GENERATOR CHANGE OUT (N/A) as a surgical intervention .  The patient's history has been reviewed, patient examined, no change in status, stable for surgery.  I have reviewed the patient's chart and labs.  Questions were answered to the patient's satisfaction.     Lewayne Bunting

## 2014-01-16 NOTE — Discharge Instructions (Signed)
Pacemaker Battery Change, Care After °Refer to this sheet in the next few weeks. These instructions provide you with information on caring for yourself after your procedure. Your health care provider may also give you more specific instructions. Your treatment has been planned according to current medical practices, but problems sometimes occur. Call your health care provider if you have any problems or questions after your procedure. °WHAT TO EXPECT AFTER THE PROCEDURE °After your procedure, it is typical to have the following sensations: °· Soreness at the pacemaker site. °HOME CARE INSTRUCTIONS  °· Keep the incision clean and dry. °· Unless advised otherwise, you may shower beginning 48 hours after your procedure. °· For the first week after the replacement, avoid stretching motions that pull at the incision site, and avoid heavy exercise with the arm that is on the same side as the incision. °· Take medicines only as directed by your health care provider. °· Keep all follow-up visits as directed by your health care provider. °SEEK MEDICAL CARE IF:  °· You have pain at the incision site that is not relieved by over-the-counter or prescription medicine. °· There is drainage or pus from the incision site. °· There is swelling larger than a lime at the incision site. °· You develop red streaking that extends above or below the incision site. °· You feel brief, intermittent palpitations, light-headedness, or any symptoms that you feel might be related to your heart. °SEEK IMMEDIATE MEDICAL CARE IF:  °· You experience chest pain that is different than the pain at the pacemaker site. °· You experience shortness of breath. °· You have palpitations or irregular heartbeat. °· You have light-headedness that does not go away quickly. °· You faint. °· You have pain that gets worse and is not relieved by medicine. °Document Released: 12/13/2012 Document Revised: 07/09/2013 Document Reviewed: 12/13/2012 °Exit[employer]® Patient  Information ©2015 [employer]. This information is not intended to replace advice given to you by your health care provider. Make sure you discuss any questions you have with your health care provider. ° °

## 2014-01-16 NOTE — CV Procedure (Signed)
Electrophysiology procedure note  Procedure performed: Removal of a previously implanted biventricular ICD generator, and insertion of a biventricular pacemaker  Preoperative diagnosis: Chronic systolic heart failure and left bundle branch block, status post insertion of a biventricular ICD, with normalization of the patient'' s left ventricular function, with the patient'' s ICD at elective replacement  Postoperative diagnosis: Same as preoperative diagnosis.  Description of the procedure: After informed consent was obtained, the patient was taken to the diagnostic electrophysiology laboratory in the fasting state. After the usual preparation and draping, intravenous Versed and fentanyl were used for sedation. 30 cc of lidocaine was infiltrated into the left infraclavicular region. A 6 cm incision was carried out. Electrocautery was you lysed I sat down to the ICD pocket. There is fairly dense fibrous scar tissue present. The ICD generator was removed, and the leads disconnected from the generator. The pocket was irrigated with antibiotic irrigation. Right atrial, right ventricular, and left ventricular leads were all evaluated and found to be working satisfactorily. These leads were connected to the new biventricular pacemaker. The shocking coil was capped. The new device was inserted back into the subcutaneous pocket. The pocket was irrigated with antibiotic irrigation. The incision was closed with 2 layers of Vicryl suture. Benzoin and Steri-Strips her pain on the skin. A pressure dressing was applied and the patient was returned to her room in satisfactory condition.  Complications: There were no immediate procedure complications  Conclusion: Successful removal of a previously implanted biventricular ICD, and insertion of a new biventricular pacemaker in a patient with chronic systolic heart failure and left bundle branch block who had near normalization of her left ventricular dysfunction with  biventricular pacing.  Lewayne Bunting, M.D.

## 2014-01-18 ENCOUNTER — Telehealth: Payer: Self-pay | Admitting: Internal Medicine

## 2014-01-18 NOTE — Telephone Encounter (Signed)
New message     Patient calling need instruction on dressing changes.

## 2014-01-18 NOTE — Telephone Encounter (Signed)
Spoke w/pt and answered questions about dressing. Pt instructed to take big bandage off and leave steri strips on. Pt voices understanding and confirmed wound check appointment.

## 2014-01-24 ENCOUNTER — Ambulatory Visit (INDEPENDENT_AMBULATORY_CARE_PROVIDER_SITE_OTHER): Payer: PRIVATE HEALTH INSURANCE | Admitting: *Deleted

## 2014-01-24 DIAGNOSIS — I428 Other cardiomyopathies: Secondary | ICD-10-CM

## 2014-01-24 DIAGNOSIS — I5022 Chronic systolic (congestive) heart failure: Secondary | ICD-10-CM

## 2014-01-24 DIAGNOSIS — I429 Cardiomyopathy, unspecified: Secondary | ICD-10-CM

## 2014-01-24 LAB — MDC_IDC_ENUM_SESS_TYPE_INCLINIC
Battery Remaining Longevity: 103.2 mo
Brady Statistic RA Percent Paced: 2.3 %
Brady Statistic RV Percent Paced: 99.94 %
Date Time Interrogation Session: 20151119161830
Implantable Pulse Generator Model: 3222
Lead Channel Impedance Value: 412.5 Ohm
Lead Channel Impedance Value: 525 Ohm
Lead Channel Pacing Threshold Amplitude: 0.75 V
Lead Channel Pacing Threshold Amplitude: 0.75 V
Lead Channel Pacing Threshold Amplitude: 1 V
Lead Channel Pacing Threshold Amplitude: 1 V
Lead Channel Pacing Threshold Amplitude: 1 V
Lead Channel Pacing Threshold Pulse Width: 0.3 ms
Lead Channel Pacing Threshold Pulse Width: 0.3 ms
Lead Channel Pacing Threshold Pulse Width: 0.3 ms
Lead Channel Pacing Threshold Pulse Width: 0.6 ms
Lead Channel Pacing Threshold Pulse Width: 0.6 ms
Lead Channel Setting Pacing Amplitude: 2 V
Lead Channel Setting Pacing Amplitude: 2 V
Lead Channel Setting Pacing Amplitude: 2 V
Lead Channel Setting Pacing Pulse Width: 0.6 ms
MDC IDC MSMT BATTERY VOLTAGE: 3.02 V
MDC IDC MSMT LEADCHNL LV IMPEDANCE VALUE: 762.5 Ohm
MDC IDC MSMT LEADCHNL RA PACING THRESHOLD PULSEWIDTH: 0.3 ms
MDC IDC MSMT LEADCHNL RA SENSING INTR AMPL: 3.8 mV
MDC IDC MSMT LEADCHNL RV PACING THRESHOLD AMPLITUDE: 1 V
MDC IDC MSMT LEADCHNL RV SENSING INTR AMPL: 11.2 mV
MDC IDC PG SERIAL: 7688750
MDC IDC SET LEADCHNL RV PACING PULSEWIDTH: 0.3 ms
MDC IDC SET LEADCHNL RV SENSING SENSITIVITY: 2 mV

## 2014-01-24 NOTE — Progress Notes (Signed)

## 2014-02-06 ENCOUNTER — Encounter: Payer: Self-pay | Admitting: Internal Medicine

## 2014-02-14 ENCOUNTER — Encounter (HOSPITAL_COMMUNITY): Payer: Self-pay | Admitting: Internal Medicine

## 2014-02-22 ENCOUNTER — Ambulatory Visit (INDEPENDENT_AMBULATORY_CARE_PROVIDER_SITE_OTHER): Payer: PRIVATE HEALTH INSURANCE

## 2014-02-22 ENCOUNTER — Ambulatory Visit (INDEPENDENT_AMBULATORY_CARE_PROVIDER_SITE_OTHER): Payer: PRIVATE HEALTH INSURANCE | Admitting: Internal Medicine

## 2014-02-22 VITALS — BP 112/68 | HR 81 | Temp 99.0°F | Resp 16 | Ht 65.0 in | Wt 224.0 lb

## 2014-02-22 DIAGNOSIS — M25561 Pain in right knee: Secondary | ICD-10-CM

## 2014-02-22 DIAGNOSIS — S8391XA Sprain of unspecified site of right knee, initial encounter: Secondary | ICD-10-CM

## 2014-02-22 MED ORDER — IBUPROFEN 600 MG PO TABS: 600.0000 mg | ORAL_TABLET | Freq: Three times a day (TID) | ORAL | Status: DC | PRN

## 2014-02-22 NOTE — Progress Notes (Signed)
   Subjective:    Patient ID: [name], female    DOB: 1955-04-18, 58 y.o.   MRN: [id number]  HPI Felt knee pain start 3 days ago after a brisk walk across campus, felt knee tighten and lose rom, then gave way a time or two. Has swelling but no heat, reddness, and no hx of gout or arthritis. Dr.[place] Did surgery arthroscopy 1985.   Review of Systems Heart disease    Objective:   Physical Exam  Constitutional: She is oriented to person, place, and time. She appears well-developed and well-nourished. She appears distressed.  HENT:  Head: Normocephalic.  Nose: Nose normal.  Eyes: Conjunctivae are normal. Pupils are equal, round, and reactive to light. No scleral icterus.  Neck: Normal range of motion. Neck supple.  Pulmonary/Chest: Effort normal.  Musculoskeletal:       Right knee: She exhibits decreased range of motion, swelling, effusion and bony tenderness. She exhibits no ecchymosis, no deformity and no erythema. Tenderness found. Medial joint line tenderness noted.  Mild effusion perhaps 20cc  Neurological: She is alert and oriented to person, place, and time. She has normal strength. No sensory deficit. She exhibits normal muscle tone. Coordination and gait abnormal.  Skin: No rash noted. No erythema.  Psychiatric: She has a normal mood and affect. Her behavior is normal. Thought content normal.    [facility] reading (PRIMARY) by  Dr.Guest moderate DJD, sts.  Hinged knee brace fitted and trained      Assessment & Plan:  RICE/Ibuprofen See ortho next week

## 2014-02-22 NOTE — Patient Instructions (Signed)
Meniscus Tear with Phase I Rehab The meniscus is a C-shaped cartilage structure, located in the knee joint between the thigh bone (femur) and the shinbone (tibia). Two menisci are located in each knee joint: the inner and outer meniscus. The meniscus acts as an adapter between the thigh bone and shinbone, allowing them to fit properly together. It also functions as a shock absorber, to reduce the stress placed on the knee joint and to help supply nutrients to the knee joint cartilage. As people age, the meniscus begins to harden and become more vulnerable to injury. Meniscus tears are a common injury, especially in older athletes. Inner meniscus tears are more common than outer meniscus tears.  SYMPTOMS   Pain in the knee, especially with standing or squatting with the affected leg.  Tenderness along the joint line.  Swelling in the knee joint (effusion), usually starting 1 to 2 days after injury.  Locking or catching of the knee joint, causing inability to straighten the knee completely.  Giving way or buckling of the knee. CAUSES  A meniscus tear occurs when a force is placed on the meniscus that is greater than it can handle. Common causes of injury include:  Direct hit (trauma) to the knee.  Twisting, pivoting, or cutting (rapidly changing direction while running), kneeling or squatting.  Without injury, due to aging. RISK INCREASES WITH:  Contact sports (football, rugby).  Sports in which cleats are used with pivoting (soccer, lacrosse) or sports in which good shoe grip and sudden change in direction are required (racquetball, basketball, squash).  Previous knee injury.  Associated knee injury, particularly ligament injuries.  Poor strength and flexibility. PREVENTION  Warm up and stretch properly before activity.  Maintain physical fitness:  Strength, flexibility, and endurance.  Cardiovascular fitness.  Protect the knee with a brace or elastic bandage.  Wear  properly fitted protective equipment (proper cleats for the surface). PROGNOSIS  Sometimes, meniscus tears heal on their own. However, definitive treatment requires surgery, followed by at least 6 weeks of recovery.  RELATED COMPLICATIONS   Recurring symptoms that result in a chronic problem.  Repeated knee injury, especially if sports are resumed too soon after injury or surgery.  Progression of the tear (the tear gets larger), if untreated.  Arthritis of the knee in later years (with or without surgery).  Complications of surgery, including infection, bleeding, injury to nerves (numbness, weakness, paralysis) continued pain, giving way, locking, nonhealing of meniscus (if repaired), need for further surgery, and knee stiffness (loss of motion). TREATMENT  Treatment first involves the use of ice and medicine, to reduce pain and inflammation. You may find using crutches to walk more comfortable. However, it is okay to bear weight on the injured knee, if the pain will allow it. Surgery is often advised as a definitive treatment. Surgery is performed through an incision near the joint (arthroscopically). The torn piece of the meniscus is removed, and if possible the joint cartilage is repaired. After surgery, the joint must be restrained. After restraint, it is important to perform strengthening and stretching exercises to help regain strength and a full range of motion. These exercises may be completed at home or with a therapist.  MEDICATION  If pain medicine is needed, nonsteroidal anti-inflammatory medicines (aspirin and ibuprofen), or other minor pain relievers (acetaminophen), are often advised.  Do not take pain medicine for 7 days before surgery.  Prescription pain relievers may be given, if your caregiver thinks they are needed. Use only as directed and   only as much as you need. HEAT AND COLD  Cold treatment (icing) should be applied for 10 to 15 minutes every 2 to 3 hours for  inflammation and pain, and immediately after activity that aggravates your symptoms. Use ice packs or an ice massage.  Heat treatment may be used before performing stretching and strengthening activities prescribed by your caregiver, physical therapist, or athletic trainer. Use a heat pack or a warm water soak. SEEK MEDICAL CARE IF:   Symptoms get worse or do not improve in 2 weeks, despite treatment.  New, unexplained symptoms develop. (Drugs used in treatment may produce side effects.) EXERCISES RANGE OF MOTION (ROM) AND STRETCHING EXERCISES - Meniscus Tear, Non-operative, Phase I These are some of the initial exercises with which you may start your rehabilitation program, until you see your caregiver again or until your symptoms are resolved. Remember:   These initial exercises are intended to be gentle. They will help you restore motion without increasing any swelling.  Completing these exercises allows less painful movement and prepares you for the more aggressive strengthening exercises in Phase II.  An effective stretch should be held for at least 30 seconds.  A stretch should never be painful. You should only feel a gentle lengthening or release in the stretched tissue. RANGE OF MOTION - Knee Flexion, Active  Lie on your back with both knees straight. (If this causes back discomfort, bend your healthy knee, placing your foot flat on the floor.)  Slowly slide your heel back toward your buttocks until you feel a gentle stretch in the front of your knee or thigh.  Hold for __________ seconds. Slowly slide your heel back to the starting position. Repeat __________ times. Complete this exercise __________ times per day.  RANGE OF MOTION - Knee Flexion and Extension, Active-Assisted  Sit on the edge of a table or chair with your thighs firmly supported. It may be helpful to place a folded towel under the end of your right / left thigh.  Flexion (bending): Place the ankle of your  healthy leg on top of the other ankle. Use your healthy leg to gently bend your right / left knee until you feel a mild tension across the top of your knee.  Hold for __________ seconds.  Extension (straightening): Switch your ankles so your right / left leg is on top. Use your healthy leg to straighten your right / left knee until you feel a mild tension on the backside of your knee.  Hold for __________ seconds. Repeat __________ times. Complete __________ times per day. STRETCH - Knee Flexion, Supine  Lie on the floor with your right / left heel and foot lightly touching the wall. (Place both feet on the wall if you do not use a door frame.)  Without using any effort, allow gravity to slide your foot down the wall slowly until you feel a gentle stretch in the front of your right / left knee.  Hold this stretch for __________ seconds. Then return the leg to the starting position, using your healthy leg for help, if needed. Repeat __________ times. Complete this stretch __________ times per day.  STRETCH - Knee Extension Sitting  Sit with your right / left leg/heel propped on another chair, coffee table, or foot stool.  Allow your leg muscles to relax, letting gravity straighten out your knee.*  You should feel a stretch behind your right / left knee. Hold this position for __________ seconds. Repeat __________ times. Complete this stretch __________   times per day.  *Your physician, physical therapist or athletic trainer may instruct you place a __________ weight on your thigh, just above your kneecap, to deepen the stretch.  STRENGTHENING EXERCISES - Meniscus Tear, Non-operative, Phase I These exercises may help you when beginning to rehabilitate your injury. They may resolve your symptoms with or without further involvement from your physician, physical therapist or athletic trainer. While completing these exercises, remember:   Muscles can gain both the endurance and the strength  needed for everyday activities through controlled exercises.  Complete these exercises as instructed by your physician, physical therapist or athletic trainer. Progress the resistance and repetitions only as guided. STRENGTH - Quadriceps, Isometrics  Lie on your back with your right / left leg extended and your opposite knee bent.  Gradually tense the muscles in the front of your right / left thigh. You should see either your knee cap slide up toward your hip or increased dimpling just above the knee. This motion will push the back of the knee down toward the floor, mat, or bed on which you are lying.  Hold the muscle as tight as you can, without increasing your pain, for __________ seconds.  Relax the muscles slowly and completely between each repetition. Repeat __________ times. Complete this exercise __________ times per day.  STRENGTH - Quadriceps, Short Arcs   Lie on your back. Place a __________ inch towel roll under your right / left knee, so that the knee bends slightly.  Raise only your lower leg by tightening the muscles in the front of your thigh. Do not allow your thigh to rise.  Hold this position for __________ seconds. Repeat __________ times. Complete this exercise __________ times per day.  OPTIONAL ANKLE WEIGHTS: Begin with ____________________, but DO NOT exceed ____________________. Increase in 1 pound/0.5 kilogram increments. STRENGTH - Quadriceps, Straight Leg Raises  Quality counts! Watch for signs that the quadriceps muscle is working, to be sure you are strengthening the correct muscles and not "cheating" by substituting with healthier muscles.  Lay on your back with your right / left leg extended and your opposite knee bent.  Tense the muscles in the front of your right / left thigh. You should see either your knee cap slide up or increased dimpling just above the knee. Your thigh may even shake a bit.  Tighten these muscles even more and raise your leg 4 to 6  inches off the floor. Hold for __________ seconds.  Keeping these muscles tense, lower your leg.  Relax the muscles slowly and completely in between each repetition. Repeat __________ times. Complete this exercise __________ times per day.  STRENGTH - Hamstring, Curls   Lay on your stomach with your legs extended. (If you lay on a bed, your feet may hang over the edge.)  Tighten the muscles in the back of your thigh to bend your right / left knee up to 90 degrees. Keep your hips flat on the bed.  Hold this position for __________ seconds.  Slowly lower your leg back to the starting position. Repeat __________ times. Complete this exercise __________ times per day.  STRENGTH - Quadriceps, Squats  Stand in a door frame so that your feet and knees are in line with the frame.  Use your hands for balance, not support, on the frame.  Slowly lower your weight, bending at the hips and knees. Keep your lower legs upright so that they are parallel with the door frame. Squat only within the range that does  not increase your knee pain. Never let your hips drop below your knees.  Slowly return upright, pushing with your legs, not pulling with your hands. Repeat __________ times. Complete this exercise __________ times per day.  STRENGTH - Quad/VMO, Isometric   Sit in a chair with your right / left knee slightly bent. With your fingertips, feel the VMO muscle just above the inside of your knee. The VMO is important in controlling the position of your kneecap.  Keeping your fingertips on this muscle. Without actually moving your leg, attempt to drive your knee down as if straightening your leg. You should feel your VMO tense. If you have a difficult time, you may wish to try the same exercise on your healthy knee first.  Tense this muscle as hard as you can without increasing any knee pain.  Hold for __________ seconds. Relax the muscles slowly and completely in between each repetition. Repeat  __________ times. Complete exercise __________ times per day.  Document Released: 03/08/1998 Document Revised: 05/17/2011 Document Reviewed: 06/06/2008 [facility] Patient Information 2015 [place], [place]. This information is not intended to replace advice given to you by your health care provider. Make sure you discuss any questions you have with your health care provider. Knee, Cartilage (Meniscus) Injury It is suspected that you have a torn cartilage (meniscus) in your knee. The menisci are made of tough cartilage and fit between the surfaces of the thigh and leg bones. The menisci are C-shaped and have a wedged profile. The wedged profile helps the stability of the joint by keeping the rounded femur surface from sliding off the flat tibial surface. The menisci are fed (nourished) by small blood vessels, but there is also a large area at the inner edge of the meniscus that does not have a good blood supply (avascular). This presents a problem when there is an injury to the meniscus because areas without good blood supply heal poorly. As a result when there is a torn cartilage in the knee, surgery is often required to fix it. This is usually done with a surgical procedure less invasive than open surgery (arthroscopy). Some times open surgery of the knee is required if there is other damage. PURPOSE OF THE MENISCUS The medial meniscus rests on the medial tibial plateau. The tibia is the large bone in your lower leg (the shin bone). The medial tibial plateau is the upper end of the bone making up the inner part of your knee. The lateral meniscus serves the same purpose and is located on the outside of the knee. The menisci help to distribute your body weight across the knee joint; they act as shock absorbers. Without the meniscus present, the weight of your body would be unevenly applied to the bones in your legs (the femur and tibia). The femur is the large bone in your thigh. This uneven weight distribution  would cause increased wear and tear on the cartilage lining the joint surfaces, leading to early damage (arthritis) of these areas. The presence of the menisci cartilage is necessary for a healthy knee. PURPOSE OF THE KNEE CARTILAGE The knee joint is made up of three bones: the thigh bone (femur), the shin bone (tibia), and the knee cap (patella). The surfaces of these bones at the knee joint are covered with cartilage called articular cartilage. This smooth, slippery surface allows the bones to slide against each other without causing bone damage. The meniscus sits between these cartilaginous surfaces of the bones. It distributes the weight evenly in  the joints and helps with the stability of the joint (keeps the joint steady). HOME CARE INSTRUCTIONS  Use crutches and external braces as instructed.  Once home, an ice pack applied to your injured knee may help with discomfort and keep the swelling down. An ice pack can be used for the first couple of days or as instructed.  Only take over-the-counter or prescription medicines for pain, discomfort, or fever as directed by your caregiver.  Call if you do not have relief of pain with medications or if there is increasing in pain.  Call if your foot becomes cold or blue.  You may resume normal diet and activities as directed.  Make sure to keep your appointments with your follow-up caregiver. This injury may require further evaluation and treatment beyond the temporary treatment given today. Document Released: 05/15/2002 Document Revised: 07/09/2013 Document Reviewed: 09/06/2008 [facility] Patient Information 2015 [place]. This information is not intended to replace advice given to you by your health care provider. Make sure you discuss any questions you have with your health care provider.

## 2014-04-20 ENCOUNTER — Emergency Department (HOSPITAL_COMMUNITY)
Admission: EM | Admit: 2014-04-20 | Discharge: 2014-04-20 | Disposition: A | Discharge status: Home / Self Care | Payer: PRIVATE HEALTH INSURANCE | Source: Home / Self Care | Attending: Emergency Medicine | Admitting: Emergency Medicine

## 2014-04-20 ENCOUNTER — Emergency Department (INDEPENDENT_AMBULATORY_CARE_PROVIDER_SITE_OTHER): Payer: PRIVATE HEALTH INSURANCE

## 2014-04-20 ENCOUNTER — Encounter (HOSPITAL_COMMUNITY): Payer: Self-pay | Admitting: Emergency Medicine

## 2014-04-20 ENCOUNTER — Other Ambulatory Visit (HOSPITAL_COMMUNITY)
Admission: RE | Admit: 2014-04-20 | Discharge: 2014-04-20 | Disposition: A | Discharge status: Home / Self Care | Payer: PRIVATE HEALTH INSURANCE | Source: Ambulatory Visit | Attending: Cardiovascular Disease | Admitting: Cardiovascular Disease

## 2014-04-20 DIAGNOSIS — J069 Acute upper respiratory infection, unspecified: Secondary | ICD-10-CM

## 2014-04-20 DIAGNOSIS — J029 Acute pharyngitis, unspecified: Secondary | ICD-10-CM | POA: Diagnosis not present

## 2014-04-20 LAB — POCT RAPID STREP A: Streptococcus, Group A Screen (Direct): NEGATIVE

## 2014-04-20 MED ORDER — ALBUTEROL SULFATE HFA 108 (90 BASE) MCG/ACT IN AERS: 2.0000 | INHALATION_SPRAY | Freq: Four times a day (QID) | RESPIRATORY_TRACT | Status: DC

## 2014-04-20 MED ORDER — IPRATROPIUM BROMIDE 0.06 % NA SOLN: 2.0000 | Freq: Four times a day (QID) | NASAL | Status: DC

## 2014-04-20 MED ORDER — HYDROCOD POLST-CHLORPHEN POLST 10-8 MG/5ML PO LQCR: 5.0000 mL | Freq: Two times a day (BID) | ORAL | Status: DC | PRN

## 2014-04-20 NOTE — ED Notes (Signed)
C/o cold sx onset 2 weeks Was placed on levofloxacin 750mg  for 10 days last Tuesday for a sinus inf Sx today include ST, facial pressure, cough Alert, no signs of acute distress.

## 2014-04-20 NOTE — ED Provider Notes (Signed)
Chief Complaint   URI   History of Present Illness   [name] is a 59 year old female with multiple medical issues who has had a six-day history of sore throat, fever to 101, chills, sweats, nasal congestion with clear drainage, headache, sinus pressure, right ear pain, cough productive yellow sputum, wheezing, nausea, vomiting, and diarrhea. The patient did a telemedicine consult 5 days ago, when the symptoms first began, and she was placed on ofloxacin 750 mg. She's been taking this since then, but not gotten any relief so far.  Review of Systems   Other than as noted above, the patient denies any of the following symptoms: Systemic:  No fevers, chills, sweats, or myalgias. Eye:  No redness or discharge. ENT:  No ear pain, headache, nasal congestion, drainage, sinus pressure, or sore throat. Neck:  No neck pain, stiffness, or swollen glands. Lungs:  No cough, sputum production, hemoptysis, wheezing, chest tightness, shortness of breath or chest pain. GI:  No abdominal pain, nausea, vomiting or diarrhea.  PMFSH   Past medical history, family history, social history, meds, and allergies were reviewed. She is allergic to penicillin. She has a history of congestive heart failure, pacemaker, diabetes, and sleep apnea. Current meds include aspirin, carvedilol, insulin, Claritin, losartan, metformin, Protonix, Crestor, verapamil, albuterol, diazepam, and Tussionex.  Physical exam   Vital signs:  BP 114/68 mmHg  Pulse 83  Temp(Src) 98 F (36.7 C) (Oral)  Resp 12  SpO2 96% General:  Alert and oriented.  In no distress.  Skin warm and dry. Eye:  No conjunctival injection or drainage. Lids were normal. ENT:  TMs and canals were normal, without erythema or inflammation.  Nasal mucosa was clear and uncongested, without drainage.  Mucous membranes were moist.  Pharynx was clear with no exudate or drainage.  There were no oral ulcerations or lesions. Neck:  Supple, no adenopathy,  tenderness or mass. Lungs:  No respiratory distress.  Lungs were clear to auscultation, without wheezes, rales or rhonchi.  Breath sounds were clear and equal bilaterally.  Heart:  Regular rhythm, without gallops, murmers or rubs. Skin:  Clear, warm, and dry, without rash or lesions.  Labs   Results for orders placed or performed during the hospital encounter of 04/20/14  POCT rapid strep A [facility])  Result Value Ref Range   Streptococcus, Group A Screen (Direct) NEGATIVE NEGATIVE     Radiology   Dg Chest 2 View  04/20/2014   CLINICAL DATA:  Cough for 1 week  EXAM: CHEST  2 VIEW  COMPARISON:  August 31, 2013  FINDINGS: There is no edema or consolidation. Heart is upper normal in size with pulmonary vascularity within normal limits. Pacemaker leads are attached to the right atrium, right ventricle, and left ventricle. No pneumothorax. No adenopathy. There is postoperative change in the lower cervical spine. There is degenerative change in the thoracic spine.  IMPRESSION: No edema or consolidation.   Electronically Signed   By: Bretta Bang III M.D.   On: 04/20/2014 14:36   Assessment     The encounter diagnosis was Viral URI.  There is no evidence of pneumonia, strep throat, sinusitis, otitis media.  I don't think that the levofloxacin is doing her much good, and I cannot think of a stronger antibiotic that she could take. I think this is a viral process. I think she just needs to give a little bit more time. She was given some Tussionex and Atrovent nasal spray as well as  albuterol for symptom relief.  Plan    1.  Meds:  The following meds were prescribed:   Discharge Medication List as of 04/20/2014  3:02 PM    START taking these medications   Details  chlorpheniramine-HYDROcodone (TUSSIONEX) 10-8 MG/5ML LQCR Take 5 mLs by mouth every 12 (twelve) hours as needed for cough., Starting 04/20/2014, Until Discontinued, Normal    ipratropium (ATROVENT) 0.06 % nasal spray Place 2  sprays into both nostrils 4 (four) times daily., Starting 04/20/2014, Until Discontinued, Print        2.  Patient Education/Counseling:  The patient was given appropriate handouts, self care instructions, and instructed in symptomatic relief.  Instructed to get extra fluids and extra rest.    3.  Follow up:  The patient was told to follow up here if no better in 3 to 4 days, or sooner if becoming worse in any way, and given some red flag symptoms such as increasing fever, difficulty breathing, chest pain, or persistent vomiting which would prompt immediate return.       Reuben Likes, MD 04/20/14 [phone]

## 2014-04-20 NOTE — Discharge Instructions (Signed)

## 2014-04-22 LAB — CULTURE, GROUP A STREP

## 2014-04-30 ENCOUNTER — Ambulatory Visit (INDEPENDENT_AMBULATORY_CARE_PROVIDER_SITE_OTHER): Payer: PRIVATE HEALTH INSURANCE | Admitting: Internal Medicine

## 2014-04-30 ENCOUNTER — Encounter: Payer: Self-pay | Admitting: Internal Medicine

## 2014-04-30 VITALS — BP 126/70 | HR 68 | Ht 67.0 in | Wt 228.0 lb

## 2014-04-30 DIAGNOSIS — I429 Cardiomyopathy, unspecified: Secondary | ICD-10-CM

## 2014-04-30 DIAGNOSIS — I5022 Chronic systolic (congestive) heart failure: Secondary | ICD-10-CM

## 2014-04-30 DIAGNOSIS — I10 Essential (primary) hypertension: Secondary | ICD-10-CM

## 2014-04-30 DIAGNOSIS — Z950 Presence of cardiac pacemaker: Secondary | ICD-10-CM

## 2014-04-30 LAB — MDC_IDC_ENUM_SESS_TYPE_INCLINIC
Battery Remaining Longevity: 96 mo
Brady Statistic RV Percent Paced: 99.95 %
Implantable Pulse Generator Serial Number: 7688750
Lead Channel Impedance Value: 487.5 Ohm
Lead Channel Impedance Value: 762.5 Ohm
Lead Channel Pacing Threshold Amplitude: 1 V
Lead Channel Pacing Threshold Pulse Width: 0.3 ms
Lead Channel Sensing Intrinsic Amplitude: 11.8 mV
Lead Channel Sensing Intrinsic Amplitude: 3.4 mV
Lead Channel Setting Pacing Amplitude: 2 V
Lead Channel Setting Pacing Amplitude: 2.5 V
Lead Channel Setting Pacing Pulse Width: 0.3 ms
Lead Channel Setting Pacing Pulse Width: 0.6 ms
Lead Channel Setting Sensing Sensitivity: 2 mV
MDC IDC MSMT BATTERY VOLTAGE: 3.01 V
MDC IDC MSMT LEADCHNL LV PACING THRESHOLD PULSEWIDTH: 0.6 ms
MDC IDC MSMT LEADCHNL RA IMPEDANCE VALUE: 412.5 Ohm
MDC IDC MSMT LEADCHNL RA PACING THRESHOLD AMPLITUDE: 0.5 V
MDC IDC MSMT LEADCHNL RA PACING THRESHOLD PULSEWIDTH: 0.3 ms
MDC IDC MSMT LEADCHNL RV PACING THRESHOLD AMPLITUDE: 1 V
MDC IDC SESS DTM: 20160223092420
MDC IDC SET LEADCHNL RA PACING AMPLITUDE: 2 V
MDC IDC STAT BRADY RA PERCENT PACED: 1.1 %

## 2014-04-30 NOTE — Patient Instructions (Signed)
Your physician wants you to follow-up in: 9 months with Dr Court Joy will receive a reminder letter in the mail two months in advance. If you don't receive a letter, please call our office to schedule the follow-up appointment.   Remote monitoring is used to monitor your Pacemaker of ICD from home. This monitoring reduces the number of office visits required to check your device to one time per year. It allows [place] to keep an eye on the functioning of your device to ensure it is working properly. You are scheduled for a device check from home on 07/30/14. You may send your transmission at any time that day. If you have a wireless device, the transmission will be sent automatically. After your physician reviews your transmission, you will receive a postcard with your next transmission date.   Increase Toresemide to twice daily until weight is down 8 pounds then back to daily

## 2014-04-30 NOTE — Assessment & Plan Note (Signed)
Her blood pressure is not elevated. She is encouraged to reduce her sodium intake.

## 2014-04-30 NOTE — Assessment & Plan Note (Signed)
Her device is working normally. Will recheck in several months. 

## 2014-04-30 NOTE — Progress Notes (Signed)
HPI [name] returns today for followup. She is a very pleasant 59 year old woman with a nonischemic cardiomyopathy, chronic systolic heart failure, and left bundle branch block, who underwent biventricular ICD implantation and had normalization of her left ventricular function, s/p downgrade to a BiV PPM. In the interim she has not taken her diuretic and admits to dietary indiscretion. She has PND and orthopnea. Her heart failure symptoms have worsened. No syncope or palpitations. Allergies  Allergen Reactions  . Flagyl [Metronidazole]   . Niacin And Related Other (See Comments) and Cough    flushing  . Penicillins Swelling  . Yeast-Related Products Swelling and Rash     Current Outpatient Prescriptions  Medication Sig Dispense Refill  . aspirin 81 MG tablet Take 81 mg by mouth daily. Takes 2    . aspirin-acetaminophen-caffeine (EXCEDRIN MIGRAINE) 250-250-65 MG per tablet Take 2 tablets by mouth daily as needed for migraine.    . carvedilol (COREG) 25 MG tablet Take 25 mg by mouth 2 (two) times daily with a meal.     . diazepam (VALIUM) 5 MG tablet Take 1 tablet (5 mg total) by mouth at bedtime as needed for anxiety. (Patient taking differently: Take 5 mg by mouth daily as needed for anxiety. ) 30 tablet 0  . estradiol (ESTRACE) 2 MG tablet Take 2 mg by mouth daily.    . fenofibrate micronized (LOFIBRA) 134 MG capsule Take 1 capsule (134 mg total) by mouth daily before breakfast. 90 capsule 3  . ibuprofen (ADVIL,MOTRIN) 600 MG tablet Take 1 tablet (600 mg total) by mouth every 8 (eight) hours as needed. 30 tablet 0  . imipramine (TOFRANIL) 25 MG tablet TAKE 1 TO 2 TABLETS BY MOUTH EVERY DAY 180 tablet PRN  . insulin aspart protamine- aspart (NOVOLOG MIX 70/30) (70-30) 100 UNIT/ML injection Inject into the skin. 25 units am 30 units pm    . insulin aspart protamine- aspart (NOVOLOG MIX 70/30) (70-30) 100 UNIT/ML injection Inject into the skin.    Marland Kitchen loratadine (CLARITIN) 10 MG tablet Take  10 mg by mouth daily.     Marland Kitchen losartan (COZAAR) 100 MG tablet Take 1 tablet (100 mg total) by mouth daily. 90 tablet 3  . metFORMIN (GLUCOPHAGE) 1000 MG tablet Take 1,000 mg by mouth 2 (two) times daily with a meal.      . pantoprazole (PROTONIX) 40 MG tablet Take 40 mg by mouth daily as needed (For acid reflux).     . phentermine 37.5 MG capsule Take 0.5 mg by mouth daily.    . Potassium 99 MG TABS Take 1 tablet by mouth daily.    . rizatriptan (MAXALT) 10 MG tablet Take 10 mg by mouth daily as needed for migraine. May repeat in 2 hours if needed    . rosuvastatin (CRESTOR) 40 MG tablet Take 20 mg by mouth every other day.    . spironolactone (ALDACTONE) 25 MG tablet Take 1 tablet (25 mg total) by mouth 2 (two) times daily. 180 tablet 1  . torsemide (DEMADEX) 20 MG tablet Take 20 mg by mouth every other day.    . verapamil (CALAN-SR) 240 MG CR tablet Take 1 tablet (240 mg total) by mouth at bedtime. 90 tablet 3   No current facility-administered medications for this visit.     Past Medical History  Diagnosis Date  . Hyperlipidemia   . Hypertension 06/25/2011    Renal dopplers - superior mesenteric artery >50% diameter reduction; R renal artery - normal patency; L proximal  renal artery 1-59% reduction (lower end of scale); both kidneys normal in size/symmetry with normal cortex and medulla  . Diabetes mellitus   . Automatic implantable cardiac defibrillator in situ     [employer] JUDE MODEL 7122  . Unspecified sleep apnea   . Leg pain 12/09/2010    doppler of R femoral artery - no evidence of dissection, AV fistula, pseudoaneurysm or other vascular abnormalities  . Sleep apnea     on CPAP - AHI during sleep study was 44  . Obesity   . Type II or unspecified type diabetes mellitus without mention of complication, not stated as uncontrolled   . Type II or unspecified type diabetes mellitus without mention of complication, not stated as uncontrolled   . CHF (congestive heart failure)   . Migraines      ROS:   All systems reviewed and negative except as noted in the HPI.   Past Surgical History  Procedure Laterality Date  . Abdominal hysterectomy    . Neck surgery    . Pacemaker insertion    . Cardiac defibrillator placement      ICD by Dr. Ladona Ridgel  . Knee surgery    . Doppler echocardiography  06/25/2011    EF >55%; moderate concentric LV hypertrophy; LA mildly dilated, mild mitral annular calcification;   . Cardiovascular stress test  08/28/2010    R/P MV - pattern of normal perfusion in all regions, no scintigraphic evidence of inducible myocardial ischemia; no EKG changes; normal perfusion study; pt did experience chesst pain during strudy, resolved spontaneously  . Cardiac catheterization  07/15/2006    no significant CAD by cardiac cath, confirmed by intravascular [place] of LAD; non-ischemic cardiomyopathy prob related to uncontrolled hypertension, DM and morbid obesity; moderate pulmonary hypertension; preserved cardiac output and cardiac index  . Biv pacemaker generator change out N/A 01/16/2014    Procedure: BIV PACEMAKER GENERATOR CHANGE OUT;  Surgeon: Marinus Maw, MD;  Location: [facility] CATH LAB;  Service: Cardiovascular;  Laterality: N/A;     Family History  Problem Relation Age of Onset  . Heart attack Father   . Hearing loss Father   . Hypertension Father   . Cancer Mother      History   Social History  . Marital Status: Married    Spouse Name: N/A  . Number of Children: N/A  . Years of Education: N/A   Occupational History  . Not on file.   Social History Main Topics  . Smoking status: Former Smoker    Types: Cigarettes    Quit date: 03/09/1975  . Smokeless tobacco: Not on file  . Alcohol Use: Yes  . Drug Use: No  . Sexual Activity: Not on file   Other Topics Concern  . 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     BP 126/70 mmHg  Pulse 68  Ht  (1.702 m)  Wt 228 lb (103.42 kg)  BMI 35.70  kg/m2  Physical Exam:  Well appearing middle-age woman, NAD HEENT: Unremarkable Neck:  6 cm JVD, no thyromegally Back:  No CVA tenderness Lungs:  Scattered basilar rales,  well-healed PPM incision HEART:  Regular rate rhythm, grade 1/6 systolic murmur, no rubs, no clicks Abd:  soft, obese, positive bowel sounds, no organomegally, no rebound, no guarding Ext:  2 plus pulses, no edema, no cyanosis, no clubbing Skin:  No rashes no nodules Neuro:  CN II through XII intact, motor grossly intact  DEVICE  Normal device function.  See PaceArt for details.   Assess/Plan:

## 2014-04-30 NOTE — Assessment & Plan Note (Signed)
Her CHF is worsened. She is not decompensated by her weight is up and she has gone from class 1 to class 2b largely due to dietary and medical non-compliance. I have asked her to take her torsemide twice a day until her weight has come down 8 lbs and then take daily until her weight is down 2 more pounds. She is encouraged to reduce her salt intake and lose weight.

## 2014-05-06 ENCOUNTER — Telehealth: Payer: Self-pay | Admitting: Cardiovascular Disease

## 2014-05-06 MED ORDER — TORSEMIDE 20 MG PO TABS: 20.0000 mg | ORAL_TABLET | ORAL | Status: DC

## 2014-05-06 MED ORDER — CARVEDILOL 25 MG PO TABS: 25.0000 mg | ORAL_TABLET | Freq: Two times a day (BID) | ORAL | Status: DC

## 2014-05-06 MED ORDER — SPIRONOLACTONE 25 MG PO TABS
25.0000 mg | ORAL_TABLET | Freq: Two times a day (BID) | ORAL | Status: DC
Start: 2014-05-06 — End: 2014-08-07

## 2014-05-06 MED ORDER — LOSARTAN POTASSIUM 100 MG PO TABS: 100.0000 mg | ORAL_TABLET | Freq: Every day | ORAL | Status: DC

## 2014-05-06 NOTE — Telephone Encounter (Signed)
Rx(s) sent to pharmacy electronically.  Patient notified she needs an appointment with Dr. Lahoma [facility] notified of refills - #90 with ZERO refills as no appointment is scheduled with Dr. Tresa Endo - patient states she will call back for appointment.

## 2014-05-06 NOTE — Telephone Encounter (Signed)
°  1. Which medications need to be refilled? Losartan,Carvedilol,Spironolactone and Torsemide  2. Which pharmacy is medication to be sent to?[facility]  3. Do they need a 30 day or 90 day supply? 90 of each and refills 4. Would they like a call back once the medication has been sent to the pharmacy?

## 2014-05-21 ENCOUNTER — Encounter: Payer: Self-pay | Admitting: Internal Medicine

## 2014-06-25 ENCOUNTER — Other Ambulatory Visit: Payer: Self-pay | Admitting: Cardiovascular Disease

## 2014-07-08 ENCOUNTER — Ambulatory Visit (INDEPENDENT_AMBULATORY_CARE_PROVIDER_SITE_OTHER): Payer: PRIVATE HEALTH INSURANCE | Admitting: Cardiovascular Disease

## 2014-07-08 ENCOUNTER — Encounter: Payer: Self-pay | Admitting: Cardiovascular Disease

## 2014-07-08 VITALS — BP 100/64 | HR 69 | Ht 67.0 in | Wt 229.3 lb

## 2014-07-08 DIAGNOSIS — E7800 Pure hypercholesterolemia, unspecified: Secondary | ICD-10-CM

## 2014-07-08 DIAGNOSIS — I428 Other cardiomyopathies: Secondary | ICD-10-CM

## 2014-07-08 DIAGNOSIS — I10 Essential (primary) hypertension: Secondary | ICD-10-CM

## 2014-07-08 DIAGNOSIS — G4730 Sleep apnea, unspecified: Secondary | ICD-10-CM | POA: Diagnosis not present

## 2014-07-08 DIAGNOSIS — I429 Cardiomyopathy, unspecified: Secondary | ICD-10-CM

## 2014-07-08 DIAGNOSIS — I519 Heart disease, unspecified: Secondary | ICD-10-CM

## 2014-07-08 DIAGNOSIS — G4733 Obstructive sleep apnea (adult) (pediatric): Secondary | ICD-10-CM

## 2014-07-08 DIAGNOSIS — I5189 Other ill-defined heart diseases: Secondary | ICD-10-CM

## 2014-07-08 DIAGNOSIS — Z9989 Dependence on other enabling machines and devices: Secondary | ICD-10-CM

## 2014-07-08 DIAGNOSIS — Z950 Presence of cardiac pacemaker: Secondary | ICD-10-CM

## 2014-07-08 MED ORDER — VERAPAMIL HCL ER 180 MG PO TBCR: 180.0000 mg | EXTENDED_RELEASE_TABLET | Freq: Every day | ORAL | Status: DC

## 2014-07-08 NOTE — Patient Instructions (Signed)
Your physician has recommended you make the following change in your medication: your verapramil has been decreased to 180 mg. A new prescription has been sent to your pharmacy  Your physician wants you to follow-up in: 1 year or sooner if needed with Dr. Tresa Endo. You will receive a reminder letter in the mail two months in advance. If you don't receive a letter, please call our office to schedule the follow-up appointment.

## 2014-07-09 ENCOUNTER — Encounter: Payer: Self-pay | Admitting: Cardiovascular Disease

## 2014-07-09 DIAGNOSIS — G4733 Obstructive sleep apnea (adult) (pediatric): Secondary | ICD-10-CM | POA: Insufficient documentation

## 2014-07-09 DIAGNOSIS — Z9989 Dependence on other enabling machines and devices: Secondary | ICD-10-CM

## 2014-07-09 NOTE — Progress Notes (Signed)
Patient ID: [name], female   DOB: 1955/06/30, 59 y.o.   MRN: [id number]      HPI: [name] is a 59 y.o. female who presents to the office for an 75 month cardiology evaluation.  [name] has a history of a nonischemic cardiomyopathy. Initial ejection fraction was in the range of 25-30%. [name] is status post biventricular ICD implantation by Dr. Lovena Le. [name] also has a history of obstructive sleep apnea on CPAP therapy admits to being 100% compliant with usage. On [name] last  echo in April 2013 [name] ejection fraction has essentially normalized and was felt to be greater than 55%.  [name] did have diastolic dysfunction. There is evidence for mild aortic valve sclerosis. 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  Since I last saw [name], [name] underwent a by the pacemaker generator change out by Dr. Lovena Le on 01/16/2014 and with [name] normalization of LV function, [name] had a downgrade from an ICD to a by IV permanent pacemaker.  [name] saw Dr. Lovena Le in follow-up in February 2016 at which time [name] had mild worsening of [name] heart failure symptoms with a change from class I.  The class IIb felt predominantly due to dietary and medical noncompliance.  [name] was advised to take torsemide twice a day until [name] weight has come down 8 pounds and then take it daily.  Over the past several months [name] has felt improved and is more compliant with [name] medical therapy.  He denies any chest pain.  At times [name] still notes some mild feet swelling.   Past Medical History  Diagnosis Date  . Hyperlipidemia   . Hypertension 06/25/2011    Renal dopplers - superior mesenteric artery >50% diameter reduction; R renal artery - normal patency; L proximal renal artery 1-59% reduction (lower end of scale); both kidneys normal in size/symmetry with normal cortex and medulla  . Diabetes mellitus   .  Automatic implantable cardiac defibrillator in [place]  . Unspecified sleep apnea   . Leg pain 12/09/2010    doppler of R femoral artery - no evidence of dissection, AV fistula, pseudoaneurysm or other vascular abnormalities  . Sleep apnea     on CPAP - AHI during sleep study was 44  . Obesity   . Type II or unspecified type diabetes mellitus without mention of complication, not stated as uncontrolled   . Type II or unspecified type diabetes mellitus without mention of complication, not stated as uncontrolled   . CHF (congestive heart failure)   . Migraines     Past Surgical History  Procedure Laterality Date  . Abdominal hysterectomy    . Neck surgery    . Pacemaker insertion    . Cardiac defibrillator placement      ICD by Dr. Lovena Le  . Knee surgery    . Doppler echocardiography  06/25/2011    EF >55%; moderate concentric LV hypertrophy; LA mildly dilated, mild mitral annular calcification;   . Cardiovascular stress test  08/28/2010    R/P MV - pattern of normal perfusion in all regions, no scintigraphic evidence of inducible myocardial ischemia; no EKG changes; normal perfusion study; pt did experience chesst pain during strudy, resolved spontaneously  . Cardiac catheterization  07/15/2006    no significant CAD by cardiac cath, confirmed by intravascular [place] of LAD; non-ischemic cardiomyopathy prob related to uncontrolled  hypertension, DM and morbid obesity; moderate pulmonary hypertension; preserved cardiac output and cardiac index  . Biv pacemaker generator change out N/A 01/16/2014    Procedure: BIV PACEMAKER GENERATOR CHANGE OUT;  Surgeon: Evans Lance, MD;  Location: [facility] CATH LAB;  Service: Cardiovascular;  Laterality: N/A;    Allergies  Allergen Reactions  . Flagyl [Metronidazole]   . Niacin And Related Other (See Comments) and Cough    flushing  . Penicillins Swelling  . Yeast-Related Products Swelling and Rash    Current Outpatient Prescriptions    Medication Sig Dispense Refill  . aspirin 81 MG tablet Take 81 mg by mouth daily. Takes 2    . aspirin-acetaminophen-caffeine (EXCEDRIN MIGRAINE) 250-250-65 MG per tablet Take 2 tablets by mouth daily as needed for migraine.    . carvedilol (COREG) 25 MG tablet Take 1 tablet (25 mg total) by mouth 2 (two) times daily with a meal. 180 tablet 0  . CRESTOR 40 MG tablet TAKE 1 TABLET BY MOUTH EVERY NIGHT AT BEDTIME 90 tablet 1  . diazepam (VALIUM) 5 MG tablet Take 1 tablet (5 mg total) by mouth at bedtime as needed for anxiety. (Patient taking differently: Take 5 mg by mouth daily as needed for anxiety. ) 30 tablet 0  . estradiol (ESTRACE) 2 MG tablet Take 2 mg by mouth daily.    . fenofibrate micronized (LOFIBRA) 134 MG capsule Take 1 capsule (134 mg total) by mouth daily before breakfast. 90 capsule 3  . ibuprofen (ADVIL,MOTRIN) 600 MG tablet Take 1 tablet (600 mg total) by mouth every 8 (eight) hours as needed. 30 tablet 0  . imipramine (TOFRANIL) 25 MG tablet TAKE 1 TO 2 TABLETS BY MOUTH EVERY DAY 180 tablet PRN  . insulin aspart protamine- aspart (NOVOLOG MIX 70/30) (70-30) 100 UNIT/ML injection Inject into the skin. 35 units am 40 units pm    . insulin aspart protamine- aspart (NOVOLOG MIX 70/30) (70-30) 100 UNIT/ML injection Inject into the skin.    Marland Kitchen loratadine (CLARITIN) 10 MG tablet Take 10 mg by mouth daily.     Marland Kitchen losartan (COZAAR) 100 MG tablet Take 1 tablet (100 mg total) by mouth daily. 90 tablet 0  . metFORMIN (GLUCOPHAGE) 1000 MG tablet Take 1,000 mg by mouth 2 (two) times daily with a meal.      . pantoprazole (PROTONIX) 40 MG tablet Take 40 mg by mouth daily as needed (For acid reflux).     . Potassium 99 MG TABS Take 1 tablet by mouth daily.    . rizatriptan (MAXALT) 10 MG tablet Take 10 mg by mouth daily as needed for migraine. May repeat in 2 hours if needed    . spironolactone (ALDACTONE) 25 MG tablet Take 1 tablet (25 mg total) by mouth 2 (two) times daily. 180 tablet 0  .  torsemide (DEMADEX) 20 MG tablet Take 1 tablet (20 mg total) by mouth every other day. 45 tablet 0  . verapamil (CALAN SR) 180 MG CR tablet Take 1 tablet (180 mg total) by mouth at bedtime. 90 tablet 3   No current facility-administered medications for this visit.    Socially [name] is married and has 2 children and 2 grandchildren. There is no tobacco or alcohol use.  ROS General: Negative; No fevers, chills, or night sweats;  HEENT: Negative; No changes in vision or hearing, sinus congestion, difficulty swallowing Pulmonary: Negative; No cough, wheezing, shortness of breath, hemoptysis Cardiovascular: See history of present illness GI: Negative; No nausea, vomiting, diarrhea,  or abdominal pain GU: Negative; No dysuria, hematuria, or difficulty voiding Musculoskeletal: Negative; no myalgias, joint pain, or weakness Hematologic/Oncology: Negative; no easy bruising, bleeding Immunologic: History of anaphylactic reaction secondary to Flagyl Endocrine: Negative; no heat/cold intolerance; no diabetes Neuro: Negative; no changes in balance, headaches Skin: Negative; No rashes or skin lesions Psychiatric: Negative; No behavioral problems, depression Sleep: Positive for obstructive sleep apnea on CPAP with 100% compliance. no daytime sleepiness, hypersomnolence, bruxism, restless legs, hypnogognic hallucinations, no cataplexy Other comprehensive 14 point system review is negative.  PE BP 100/64 mmHg  Pulse 69  Ht _0  (1.702 m)  Wt 229 lb 4.8 oz (104.01 kg)  BMI 35.91 kg/m2  General: Alert, oriented, no distress.  Skin: normal turgor, no rashes HEENT: Normocephalic, atraumatic. Pupils round and reactive; sclera anicteric;no lid lag.  Nose without nasal septal hypertrophy Mouth/Parynx benign; Mallinpatti scale 2/3 Neck: No JVD, no carotid briuts; normal carotid upstroke Lungs: clear to ausculatation and percussion; no wheezing or rales Chest wall: no tenderness to palpation Heart: RRR,  s1 s2 normal 1/6 systolic murmur.  No diastolic murmur.  No rubs thrills or heaves. Abdomen: soft, nontender with central adiposity; no hepatosplenomehaly, BS+; abdominal aorta nontender and not dilated by palpation. Back: No CVA tenderness Pulses 2+ Extremities: no clubbing cyanosis or edema, Homan's sign negative  Neurologic: grossly nonfocal; grossly normal cranial nerves. Psychologic: Normal affect and mood  ECG (independently read by me): Atrially sensed rhythm and 100% ventricular pacing    January 2015 ECG (interpreted by me) : Atrial lead sensed, ventricular paced rhythm with 100% ventricular capture    LABS: BMP Latest Ref Rng 01/03/2014 08/16/2013 07/24/2013  Glucose 70 - 99 mg/dL 267(H) 301(H) 214(H)  BUN 6 - 23 mg/dL _1 Creatinine 0.4 - 1.2 mg/dL 0.9 0.79 0.73  Sodium 135 - 145 mEq/L 133(L) 132(L) 138  Potassium 3.5 - 5.1 mEq/L 4.1 4.6 3.8  Chloride 96 - 112 mEq/L 100 95(L) 101  CO2 19 - 32 mEq/L _2 Calcium 8.4 - 10.5 mg/dL 9.6 9.8 9.2   Hepatic Function Latest Ref Rng 08/16/2013 07/24/2013  Total Protein 6.0 - 8.3 g/dL 7.2 7.0  Albumin 3.5 - 5.2 g/dL 4.2 4.1  AST 0 - 37 U/L 19 20  ALT 0 - 35 U/L 22 25  Alk Phosphatase 39 - 117 U/L 69 64  Total Bilirubin 0.2 - 1.2 mg/dL 0.4 0.5  Bilirubin, Direct 0.0 - 0.3 mg/dL - 0.1   CBC Latest Ref Rng 01/03/2014 08/16/2013 07/24/2013  WBC 4.0 - 10.5 K/uL 9.0 9.4 9.1  Hemoglobin 12.0 - 15.0 g/dL 13.8 14.2 14.1  Hematocrit 36.0 - 46.0 % 41.5 40.8 40.4  Platelets 150.0 - 400.0 K/uL 351.0 331 279   Lab Results  Component Value Date   TSH 1.631 07/24/2013   Lipid Panel     Component Value Date/Time   CHOL 102 07/24/2013 0940   TRIG 154* 07/24/2013 0940   HDL 32* 07/24/2013 0940   CHOLHDL 3.2 07/24/2013 0940   VLDL 31 07/24/2013 0940   LDLCALC 39 07/24/2013 0940     RADIOLOGY: No results found.    ASSESSMENT AND PLAN: [name] is a 59 year old female with a previous history of a nonischemic  cardiomyopathy with subsequent normalization of LV function following biventricular ICD implantation and medical therapy.  In November 2015, [name] underwent a generator change and downgrade to a by the permanent pacemaker rather than ICD.  [name] had issues with not taking [name] diuretic  and dietary indiscretion leading to some class II heart failure symptoms.  [name] weight today has stabilized at approximate 229 pounds which is up from 224 in December 2015.  [name] blood pressure today is controlled on losartan 100 mg, Aldactone 25 mg twice a day, torsemide, for which [name] is taking 20 mg every other day and verapamil 240 mg daily.  I have suggested [name] reduce [name] verapamil to 180 mg in light of [name] peripheral edema which may be helpful.  [name] also can take torsemide 20 mg daily depending upon ankle swelling in [name] weight.  [name] recently has had [name] NovoLog insulin dose increased by Dr. Soyla Murphy.  [name] continues to use CPAP with 100% compliance with reference to [name] obstructive sleep apnea.  [name] is maintained on combination therapy with Crestor and fenofibrate for mixed hyperlipidemia.  [name] will be seeing Dr. Wayland Denis and laboratory will be rechecked.  [name] will be seeing Dr. Lovena Le in follow-up.  I will see [name] in one year for reevaluation.  Troy Sine, MD, [facility]  07/09/2014 7:05 PM

## 2014-07-18 ENCOUNTER — Ambulatory Visit: Payer: Self-pay | Admitting: Internal Medicine

## 2014-07-25 ENCOUNTER — Encounter: Payer: Self-pay | Admitting: Physician Assistant

## 2014-07-25 ENCOUNTER — Ambulatory Visit (INDEPENDENT_AMBULATORY_CARE_PROVIDER_SITE_OTHER): Payer: PRIVATE HEALTH INSURANCE | Admitting: Physician Assistant

## 2014-07-25 VITALS — BP 122/72 | HR 76 | Temp 97.7°F | Resp 16 | Ht 67.0 in | Wt 228.0 lb

## 2014-07-25 DIAGNOSIS — I519 Heart disease, unspecified: Secondary | ICD-10-CM

## 2014-07-25 DIAGNOSIS — I428 Other cardiomyopathies: Secondary | ICD-10-CM

## 2014-07-25 DIAGNOSIS — N182 Chronic kidney disease, stage 2 (mild): Secondary | ICD-10-CM

## 2014-07-25 DIAGNOSIS — E663 Overweight: Secondary | ICD-10-CM | POA: Insufficient documentation

## 2014-07-25 DIAGNOSIS — E559 Vitamin D deficiency, unspecified: Secondary | ICD-10-CM

## 2014-07-25 DIAGNOSIS — I10 Essential (primary) hypertension: Secondary | ICD-10-CM

## 2014-07-25 DIAGNOSIS — E7800 Pure hypercholesterolemia, unspecified: Secondary | ICD-10-CM

## 2014-07-25 DIAGNOSIS — F411 Generalized anxiety disorder: Secondary | ICD-10-CM | POA: Insufficient documentation

## 2014-07-25 DIAGNOSIS — Z9989 Dependence on other enabling machines and devices: Secondary | ICD-10-CM

## 2014-07-25 DIAGNOSIS — I5022 Chronic systolic (congestive) heart failure: Secondary | ICD-10-CM

## 2014-07-25 DIAGNOSIS — G4733 Obstructive sleep apnea (adult) (pediatric): Secondary | ICD-10-CM

## 2014-07-25 DIAGNOSIS — E1143 Type 2 diabetes mellitus with diabetic autonomic (poly)neuropathy: Secondary | ICD-10-CM | POA: Insufficient documentation

## 2014-07-25 DIAGNOSIS — Z950 Presence of cardiac pacemaker: Secondary | ICD-10-CM

## 2014-07-25 DIAGNOSIS — E669 Obesity, unspecified: Secondary | ICD-10-CM | POA: Insufficient documentation

## 2014-07-25 DIAGNOSIS — Z79899 Other long term (current) drug therapy: Secondary | ICD-10-CM

## 2014-07-25 DIAGNOSIS — IMO0002 Reserved for concepts with insufficient information to code with codable children: Secondary | ICD-10-CM

## 2014-07-25 DIAGNOSIS — I5189 Other ill-defined heart diseases: Secondary | ICD-10-CM

## 2014-07-25 DIAGNOSIS — K219 Gastro-esophageal reflux disease without esophagitis: Secondary | ICD-10-CM

## 2014-07-25 DIAGNOSIS — I429 Cardiomyopathy, unspecified: Secondary | ICD-10-CM

## 2014-07-25 DIAGNOSIS — G43809 Other migraine, not intractable, without status migrainosus: Secondary | ICD-10-CM

## 2014-07-25 DIAGNOSIS — E1322 Other specified diabetes mellitus with diabetic chronic kidney disease: Secondary | ICD-10-CM

## 2014-07-25 DIAGNOSIS — E1365 Other specified diabetes mellitus with hyperglycemia: Secondary | ICD-10-CM

## 2014-07-25 DIAGNOSIS — E1329 Other specified diabetes mellitus with other diabetic kidney complication: Secondary | ICD-10-CM

## 2014-07-25 LAB — CBC WITH DIFFERENTIAL/PLATELET
BASOS PCT: 0 % (ref 0–1)
Basophils Absolute: 0 10*3/uL (ref 0.0–0.1)
EOS ABS: 0.1 10*3/uL (ref 0.0–0.7)
Eosinophils Relative: 1 % (ref 0–5)
HCT: 40.6 % (ref 36.0–46.0)
Hemoglobin: 13.9 g/dL (ref 12.0–15.0)
Lymphocytes Relative: 36 % (ref 12–46)
Lymphs Abs: 2.8 10*3/uL (ref 0.7–4.0)
MCH: 30.8 pg (ref 26.0–34.0)
MCHC: 34.2 g/dL (ref 30.0–36.0)
MCV: 89.8 fL (ref 78.0–100.0)
MONOS PCT: 8 % (ref 3–12)
MPV: 10 fL (ref 8.6–12.4)
Monocytes Absolute: 0.6 10*3/uL (ref 0.1–1.0)
Neutro Abs: 4.2 10*3/uL (ref 1.7–7.7)
Neutrophils Relative %: 55 % (ref 43–77)
Platelets: 310 10*3/uL (ref 150–400)
RBC: 4.52 MIL/uL (ref 3.87–5.11)
RDW: 14.3 % (ref 11.5–15.5)
WBC: 7.7 10*3/uL (ref 4.0–10.5)

## 2014-07-25 NOTE — Progress Notes (Signed)
Complete Physical  Assessment and Plan: 1. Essential hypertension - continue medications, DASH diet, exercise and monitor at home. Call if greater than 130/80.  - CBC with Differential/Platelet - Hepatic function panel - TSH  2. HYPERCHOLESTEROLEMIA -continue medications, check lipids, decrease fatty foods, increase activity.  - Lipid panel  3. 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 + neuropathy, added to problem list, declines meds at this time.  - BASIC METABOLIC PANEL WITH GFR - Urinalysis, Routine w reflex microscopic - Microalbumin / creatinine urine ratio  4. Chronic systolic heart failure Control blood pressure, cholesterol, glucose, increase exercise.  Continue cardio follow up Monitor weight daily  5. Nonischemic cardiomyopathy Control blood pressure, cholesterol, glucose, increase exercise.  Continue cardio follow up Monitor weight daily  6. Other migraine without status migrainosus, not intractable Controlled, avoid triggers  7. OSA on CPAP Sleep apnea- continue CPAP, weight loss advised.   8. Diastolic dysfunction Monitor weight, control BP  9. Biventricular cardiac pacemaker in situ No AICD due to improvement of EF with medication, now has pacemaker  10. Obesity Obesity with co morbidities- long discussion about weight loss, diet, and exercise  11. Gastroesophageal reflux disease, esophagitis presence not specified GERD- will try to get off PPiI given info for taper and zantac sent in  12. Generalized anxiety disorder continue medications, stress management techniques discussed, increase water, good sleep hygiene discussed, increase exercise, and increase veggies.   13. Vitamin D deficiency - Vit  D  25 hydroxy (rtn osteoporosis monitoring)  14. Medication management - Magnesium   Discussed med's effects and SE's. Screening labs and tests as requested with regular follow-up as recommended.  HPI 59 y.o. female  presents for a complete physical. Her blood pressure has been controlled at home, today their BP is BP: 122/72 mmHg She does not workout regularly, she walks a little. She denies chest pain, shortness of breath, dizziness.  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 Tresa Endo and follows with them regularly. She is on BB, ARB, spirolactone, and torsemide. She had AICD changed to pacemaker in November of this year after discussing with Dr. Tresa Endo has had normalization of her EF with medications.  She is on cholesterol medication, crestor 40mg  and fenofibrate and denies myalgias. Her cholesterol is at goal. The cholesterol last visit was:   Lab Results  Component Value Date   CHOL 102 07/24/2013   HDL 32* 07/24/2013   LDLCALC 39 07/24/2013   TRIG 154* 07/24/2013   CHOLHDL 3.2 07/24/2013   She has been working on diet and exercise for Diabetes with diabetic chronic kidney disease, with other circulatory complications and with diabetic polyneuropathy. She follows up with Dr. Talmage Nap for her uncontrolled DM, she was switched to Novolog 70/30 mix 60 in the morning and 60 at night and metformin,has been running 200's but recently increase insulin within last 4 weeks, she is checking her sugars twice a day. She has been doing better with tas a year ago and she is due. aking shots twice a day.  She see's Dr. Hazle Quant for eye exams, last one 6 months ago, she is on bASA, she is on ACE/ARB, and denies hypoglycemia , polydipsia, polyuria and visual disturbances. Does have the beginning of some paresthesias in bilateral feet, not consistent.   Last A1C  was:  Lab Results  Component Value Date   HGBA1C 11.3* 07/24/2013  Patient is on Vitamin D supplement.  Lab Results   Component Value Date   VD25OH 46 07/24/2013  Has history of migraines.  She follows with Dr. Nicholas Lose, has had several seb cyst around breast and has had to postpone her MGM due to this, culture negative, may have removed.  Mobid obesity with noncompliance- BMI is Body mass index is 35.7 kg/(m^2)., she is working on diet and exercise. She has OSA and is on CPAP  Wt Readings from Last 3 Encounters:  07/25/14 228 lb (103.42 kg)  07/08/14 229 lb 4.8 oz (104.01 kg)  04/30/14 228 lb (103.42 kg)   Current Medications:  Current Outpatient Prescriptions on File Prior to Visit  Medication Sig Dispense Refill  . aspirin 81 MG tablet Take 81 mg by mouth daily. Takes 2    . aspirin-acetaminophen-caffeine (EXCEDRIN MIGRAINE) 250-250-65 MG per tablet Take 2 tablets by mouth daily as needed for migraine.    . carvedilol (COREG) 25 MG tablet Take 1 tablet (25 mg total) by mouth 2 (two) times daily with a meal. 180 tablet 0  . CRESTOR 40 MG tablet TAKE 1 TABLET BY MOUTH EVERY NIGHT AT BEDTIME 90 tablet 1  . diazepam (VALIUM) 5 MG tablet Take 1 tablet (5 mg total) by mouth at bedtime as needed for anxiety. (Patient taking differently: Take 5 mg by mouth daily as needed for anxiety. ) 30 tablet 0  . estradiol (ESTRACE) 2 MG tablet Take 2 mg by mouth daily.    . fenofibrate micronized (LOFIBRA) 134 MG capsule Take 1 capsule (134 mg total) by mouth daily before breakfast. 90 capsule 3  . ibuprofen (ADVIL,MOTRIN) 600 MG tablet Take 1 tablet (600 mg total) by mouth every 8 (eight) hours as needed. 30 tablet 0  . imipramine (TOFRANIL) 25 MG tablet TAKE 1 TO 2 TABLETS BY MOUTH EVERY DAY 180 tablet PRN  . insulin aspart protamine- aspart (NOVOLOG MIX 70/30) (70-30) 100 UNIT/ML injection Inject into the skin. Inject 60 units in a.m. And 60 units in p.m.    Marland Kitchen insulin aspart protamine- aspart (NOVOLOG MIX 70/30) (70-30) 100 UNIT/ML injection Inject into the skin.    Marland Kitchen loratadine (CLARITIN) 10 MG tablet Take 10 mg by mouth  daily.     Marland Kitchen losartan (COZAAR) 100 MG tablet Take 1 tablet (100 mg total) by mouth daily. 90 tablet 0  . metFORMIN (GLUCOPHAGE) 1000 MG tablet Take 1,000 mg by mouth 2 (two) times daily with a meal.      . pantoprazole (PROTONIX) 40 MG tablet Take 40 mg by mouth daily as needed (For acid reflux).     . Potassium 99 MG TABS Take 1 tablet by mouth daily.    Marland Kitchen spironolactone (ALDACTONE) 25 MG tablet Take 1 tablet (25 mg total) by mouth 2 (two) times daily. 180 tablet 0  . torsemide (DEMADEX) 20 MG tablet Take 1 tablet (20 mg total) by mouth every other day. 45 tablet 0  . verapamil (CALAN SR) 180 MG CR tablet Take 1 tablet (180 mg total) by mouth at bedtime. 90 tablet 3   No current facility-administered medications on file prior to visit.   Health Maintenance:   Tetanus: Patient states has had had within last 10 years, will wait Pneumovax: declines Prevnar 63: due age 41 Flu vaccine: declines Zostavax: N/A Pap: March 2015 Dr. Tenny Craw appointment in June MGM: March 2015 normal, Dr. Tenny Craw, due June  DEXA: 2006 t -1.3 DUE Colonoscopy: Dec 2007 due this year/next year will schedule EGD: Dec 2007 Stress test 2012 Echo 09/2013 US soft tissues: 08/2013 CT AB 2010  Patient Care Team: Lucky Cowboy, MD as PCP - General (Internal Medicine) Lennette Bihari, MD as Consulting Physician (Cardiology) Fletcher Anon, MD as Consulting Physician (Pediatrics) Bernette Redbird, MD as Consulting Physician (Gastroenterology) Dorisann Frames, MD as Consulting Physician (Endocrinology) Donovan Kail, MD as Consulting Physician (Obstetrics and Gynecology) Venancio Poisson, MD as Consulting Physician (Dermatology)  Allergies:  Allergies  Allergen Reactions  . Flagyl [Metronidazole]   . Niacin And Related Other (See Comments) and Cough    flushing  . Penicillins Swelling  . Yeast-Related Products Swelling and Rash   Medical History:  Past Medical History  Diagnosis Date  . Hyperlipidemia   . Hypertension  06/25/2011    Renal dopplers - superior mesenteric artery >50% diameter reduction; R renal artery - normal patency; L proximal renal artery 1-59% reduction (lower end of scale); both kidneys normal in size/symmetry with normal cortex and medulla  . Diabetes mellitus   . Automatic implantable cardiac defibrillator in situ     [employer] 7122  . Unspecified sleep apnea   . Leg pain 12/09/2010    doppler of R femoral artery - no evidence of dissection, AV fistula, pseudoaneurysm or other vascular abnormalities  . Sleep apnea     on CPAP - AHI during sleep study was 44  . Obesity   . Type II or unspecified type diabetes mellitus without mention of complication, not stated as uncontrolled   . Type II or unspecified type diabetes mellitus without mention of complication, not stated as uncontrolled   . CHF (congestive heart failure)   . Migraines    Surgical History:  Past Surgical History  Procedure Laterality Date  . Abdominal hysterectomy    . Neck surgery    . Pacemaker insertion    . Cardiac defibrillator placement      ICD by Dr. Ladona Ridgel  . Knee surgery    . Doppler echocardiography  06/25/2011    EF >55%; moderate concentric LV hypertrophy; LA mildly dilated, mild mitral annular calcification;   . Cardiovascular stress test  08/28/2010    R/P MV - pattern of normal perfusion in all regions, no scintigraphic evidence of inducible myocardial ischemia; no EKG changes; normal perfusion study; pt did experience chesst pain during strudy, resolved spontaneously  . Cardiac catheterization  07/15/2006    no significant CAD by cardiac cath, confirmed by intravascular [place] of LAD; non-ischemic cardiomyopathy prob related to uncontrolled hypertension, DM and morbid obesity; moderate pulmonary hypertension; preserved cardiac output and cardiac index  . Biv pacemaker generator change out N/A 01/16/2014    Procedure: BIV PACEMAKER GENERATOR CHANGE OUT;  Surgeon: Marinus Maw, MD;  Location: [facility] CATH  LAB;  Service: Cardiovascular;  Laterality: N/A;   Family History:  Family History  Problem Relation Age of Onset  . Heart attack Father   . Hearing loss Father   . Hypertension Father   . Cancer Mother    Social History:  History  Substance Use Topics  . Smoking status: Former Smoker    Types: Cigarettes    Quit date: 03/09/1975  . Smokeless tobacco: Not on file  . Alcohol Use: 0.0 oz/week    0 Standard drinks or equivalent per week   Review of Systems  Constitutional: Negative.   HENT: Negative.   Respiratory: Positive for shortness of breath.  Negative for cough, hemoptysis, sputum production and wheezing.   Cardiovascular: Negative.   Gastrointestinal: Positive for heartburn. Negative for nausea, vomiting, abdominal pain, diarrhea, constipation, blood in stool and melena.  Genitourinary: Negative.   Musculoskeletal: Positive for myalgias and back pain. Negative for joint pain, falls and neck pain.  Skin: Positive for rash. Negative for itching.  Neurological: Positive for tingling. Negative for dizziness, tremors, sensory change, speech change, focal weakness, seizures and loss of consciousness.  Psychiatric/Behavioral: Negative.     Physical Exam: Estimated body mass index is 35.7 kg/(m^2) as calculated from the following:   Height as of this encounter:  (1.702 m).   Weight as of this encounter: 228 lb (103.42 kg). BP 122/72 mmHg  Pulse 76  Temp(Src) 97.7 F (36.5 C)  Resp 16  Ht  (1.702 m)  Wt 228 lb (103.42 kg)  BMI 35.70 kg/m2 General Appearance: Well nourished, in no apparent distress. Eyes: PERRLA, EOMs, conjunctiva no swelling or erythema, normal fundi and vessels. Sinuses: No Frontal/maxillary tenderness ENT/Mouth: Ext aud canals clear, normal light reflex with TMs without erythema, bulging.  Good dentition. No erythema, swelling, or exudate on post pharynx. Tonsils not swollen or erythematous. Hearing normal. + TMJ tenderness Neck: Supple,  thyroid normal. No bruits Respiratory: Respiratory effort normal, BS equal bilaterally without rales, rhonchi, wheezing or stridor. Cardio: RRR without murmurs, rubs or gallops. Brisk peripheral pulses with trace edema Chest: symmetric, with normal excursions and percussion. Breasts: defer Abdomen: obese, soft, nontender without organomegaly.   Lymphatics: Non tender without lymphadenopathy.  Genitourinary: defer Musculoskeletal: Full ROM all peripheral extremities,5/5 strength, and normal gait. Skin: Warm, dry without rashes, lesions, ecchymosis.  Neuro: Cranial nerves intact, reflexes equal bilaterally. Normal muscle tone, no cerebellar symptoms. Sensation decreased bilateral feet to 1/3 up shin.  Psych: Awake and oriented X 3, normal affect, Insight and Judgment appropriate.   Quentin Mulling 9:18 AM

## 2014-07-25 NOTE — Patient Instructions (Addendum)
Suggest asking Dr. Tenny Craw about bone density, had in 2006 and had T -1.3 at that time, suggest repeat  Due colonoscopy.   Check on tetanus shot, when was the last time?  Diabetes is a very complicated disease...lets simplify it.  An easy way to look at it to understand the complications is if you think of the extra sugar floating in your blood stream as glass shards floating through your blood stream.    Diabetes affects your small vessels first: 1) The glass shards (sugar) scraps down the tiny blood vessels in your eyes and lead to diabetic retinopathy, the leading cause of blindness in the [place]. Diabetes is the leading cause of newly diagnosed adult (46 to 58 years of age) blindness in the [place].  2) The glass shards scratches down the tiny vessels of your legs leading to nerve damage called neuropathy and can lead to amputations of your feet. More than 60% of all non-traumatic amputations of lower limbs occur in people with diabetes.  3) Over time the small vessels in your brain are shredded and closed off, individually this does not cause any problems but over a long period of time many of the small vessels being blocked can lead to Vascular Dementia.   4) Your kidney's are a filter system and have a "net" that keeps certain things in the body and lets bad things out. Sugar shreds this net and leads to kidney damage and eventually failure. Decreasing the sugar that is destroying the net and certain blood pressure medications can help stop or decrease progression of kidney disease. Diabetes was the primary cause of kidney failure in 44 percent of all new cases in 2011.  5) Diabetes also destroys the small vessels in your penis that lead to erectile dysfunction. Eventually the vessels are so damaged that you may not be responsive to cialis or viagra.   Diabetes and your large vessels: Your larger vessels consist of your coronary arteries in your heart and the carotid vessels to your  brain. Diabetes or even increased sugars put you at 300% increased risk of heart attack and stroke and this is why.. The sugar scrapes down your large blood vessels and your body sees this as an internal injury and tries to repair itself. Just like you get a scab on your skin, your platelets will stick to the blood vessel wall trying to heal it. This is why we have diabetics on low dose aspirin daily, this prevents the platelets from sticking and can prevent plaque formation. In addition, your body takes cholesterol and tries to shove it into the open wound. This is why we want your LDL, or bad cholesterol, below 70.   The combination of platelets and cholesterol over 5-10 years forms plaque that can break off and cause a heart attack or stroke.   PLEASE REMEMBER:  Diabetes is preventable! Up to 85 percent of complications and morbidities among individuals with type 2 diabetes can be prevented, delayed, or effectively treated and minimized with regular visits to a health professional, appropriate monitoring and medication, and a healthy diet and lifestyle.   Your A1C is a measure of your sugar over the past 3 months and is not affected by what you have eaten over the past few days. Diabetes increases your chances of stroke and heart attack over 300 % and is the leading cause of blindness and kidney failure in the [place]. Please make sure you decrease bad carbs like white bread, white rice,  potatoes, corn, soft drinks, pasta, cereals, refined sugars, sweet tea, dried fruits, and fruit juice. Good carbs are okay to eat in moderation like sweet potatoes, brown rice, whole grain pasta/bread, most fruit (except dried fruit) and you can eat as many veggies as you want.   Greater than 6.5 is considered diabetic. Between 6.4 and 5.7 is prediabetic If your A1C is less than 5.7 you are NOT diabetic.  Targets for Glucose Readings: Time of Check Target for patients WITHOUT Diabetes Target for DIABETICS   Before Meals Less than 100  less than 150  Two hours after meals Less than 200  Less than 250   Recommendations For Diabetic/Prediabetic Patients:   -  Take medications as prescribed  -  Recommend Dr Francis Dowse Fuhrman's book "The End of Diabetes "  And "The End of Dieting"- Can get at  www.Amazon.com and encourage also get the Audio CD book  - AVOID Animal products, ie. Meat - red/white, Poultry and Dairy/especially cheese - Exercise at least 5 times a week for 30 minutes or preferably daily.  - No Smoking - Drink less than 2 drinks a day.  - Monitor your feet for sores - Have yearly Eye Exams - Recommend annual Flu vaccine  - Recommend Pneumovax and Prevnar vaccines - Shingles Vaccine (Zostavax) if over 52 y.o.  Goals:   - BMI less than 24 - Fasting sugar less than 130 or less than 150 if tapering medicines to lose weight  - Systolic BP less than 130  - Diastolic BP less than 80 - Bad LDL Cholesterol less than 70 - Triglycerides less than 150   Before you even begin to attack a weight-loss plan, it pays to remember this: You are not fat. You have fat. Losing weight isn't about blame or shame; it's simply another achievement to accomplish. Dieting is like any other skill-you have to buckle down and work at it. As long as you act in a smart, reasonable way, you'll ultimately get where you want to be. Here are some weight loss pearls for you.  1. It's Not a Diet. It's a Lifestyle Thinking of a diet as something you're on and suffering through only for the short term doesn't work. To shed weight and keep it off, you need to make permanent changes to the way you eat. It's OK to indulge occasionally, of course, but if you cut calories temporarily and then revert to your old way of eating, you'll gain back the weight quicker than you can say yo-yo. Use it to lose it. Research shows that one of the best predictors of long-term weight loss is how many pounds you drop in the first month. For that  reason, nutritionists often suggest being stricter for the first two weeks of your new eating strategy to build momentum. Cut out added sugar and alcohol and avoid unrefined carbs. After that, figure out how you can reincorporate them in a way that's healthy and maintainable.  2. There's a Right Way to Exercise Working out burns calories and fat and boosts your metabolism by building muscle. But those trying to lose weight are notorious for overestimating the number of calories they burn and underestimating the amount they take in. Unfortunately, your system is biologically programmed to hold on to extra pounds and that means when you start exercising, your body senses the deficit and ramps up its hunger signals. If you're not diligent, you'll eat everything you burn and then some. Use it to lose it. Cardio gets  all the exercise glory, but strength and interval training are the real heroes. They help you build lean muscle, which in turn increases your metabolism and calorie-burning ability 3. Don't Overreact to Mild Hunger Some people have a hard time losing weight because of hunger anxiety. To them, being hungry is bad-something to be avoided at all costs-so they carry snacks with them and eat when they don't need to. Others eat because they're stressed out or bored. While you never want to get to the point of being ravenous (that's when bingeing is likely to happen), a hunger pang, a craving, or the fact that it's 3:00 p.m. should not send you racing for the vending machine or obsessing about the energy bar in your purse. Ideally, you should put off eating until your stomach is growling and it's difficult to concentrate.  Use it to lose it. When you feel the urge to eat, use the HALT method. Ask yourself, Am I really hungry? Or am I angry or anxious, lonely or bored, or tired? If you're still not certain, try the apple test. If you're truly hungry, an apple should seem delicious; if it doesn't, something  else is going on. Or you can try drinking water and making yourself busy, if you are still hungry try a healthy snack.  4. Not All Calories Are Created Equal The mechanics of weight loss are pretty simple: Take in fewer calories than you use for energy. But the kind of food you eat makes all the difference. Processed food that's high in saturated fat and refined starch or sugar can cause inflammation that disrupts the hormone signals that tell your brain you're full. The result: You eat a lot more.  Use it to lose it. Clean up your diet. Swap in whole, unprocessed foods, including vegetables, lean protein, and healthy fats that will fill you up and give you the biggest nutritional bang for your calorie buck. In a few weeks, as your brain starts receiving regular hunger and fullness signals once again, you'll notice that you feel less hungry overall and naturally start cutting back on the amount you eat.  5. Protein, Produce, and Plant-Based Fats Are Your Weight-Loss Trinity Here's why eating the three Ps regularly will help you drop pounds. Protein fills you up. You need it to build lean muscle, which keeps your metabolism humming so that you can torch more fat. People in a weight-loss program who ate double the recommended daily allowance for protein (about 110 grams for a 150-pound woman) lost 70 percent of their weight from fat, while people who ate the RDA lost only about 40 percent, one study found. Produce is packed with filling fiber. "It's very difficult to consume too many calories if you're eating a lot of vegetables. Example: Three cups of broccoli is a lot of food, yet only 93 calories. (Fruit is another story. It can be easy to overeat and can contain a lot of calories from sugar, so be sure to monitor your intake.) Plant-based fats like olive oil and those in avocados and nuts are healthy and extra satiating.  Use it to lose it. Aim to incorporate each of the three Ps into every meal and  snack. People who eat protein throughout the day are able to keep weight off, according to a study in the American Journal of Clinical Nutrition. In addition to meat, poultry and seafood, good sources are beans, lentils, eggs, tofu, and yogurt. As for fat, keep portion sizes in check by measuring  out salad dressing, oil, and nut butters (shoot for one to two tablespoons). Finally, eat veggies or a little fruit at every meal. People who did that consumed 308 fewer calories but didn't feel any hungrier than when they didn't eat more produce.  7. How You Eat Is As Important As What You Eat In order for your brain to register that you're full, you need to focus on what you're eating. Sit down whenever you eat, preferably at a table. Turn off the TV or computer, put down your phone, and look at your food. Smell it. Chew slowly, and don't put another bite on your fork until you swallow. When women ate lunch this attentively, they consumed 30 percent less when snacking later than those who listened to an audiobook at lunchtime, according to a study in the [place] Journal of Nutrition. 8. Weighing Yourself Really Works The scale provides the best evidence about whether your efforts are paying off. Seeing the numbers tick up or down or stagnate is motivation to keep going-or to rethink your approach. A 2015 study at [facility] found that daily weigh-ins helped people lose more weight, keep it off, and maintain that loss, even after two years. Use it to lose it. Step on the scale at the same time every day for the best results. If your weight shoots up several pounds from one weigh-in to the next, don't freak out. Eating a lot of salt the night before or having your period is the likely culprit. The number should return to normal in a day or two. It's a steady climb that you need to do something about. 9. Too Much Stress and Too Little Sleep Are Your Enemies When you're tired and frazzled, your body cranks up  the production of cortisol, the stress hormone that can cause carb cravings. Not getting enough sleep also boosts your levels of ghrelin, a hormone associated with hunger, while suppressing leptin, a hormone that signals fullness and satiety. People on a diet who slept only five and a half hours a night for two weeks lost 55 percent less fat and were hungrier than those who slept eight and a half hours, according to a study in the [place] [employer] Journal. Use it to lose it. Prioritize sleep, aiming for seven hours or more a night, which research shows helps lower stress. And make sure you're getting quality zzz's. If a snoring spouse or a fidgety cat wakes you up frequently throughout the night, you may end up getting the equivalent of just four hours of sleep, according to a study from [facility]. Keep pets out of the bedroom, and use a white-noise app to drown out snoring. 10. You Will Hit a plateau-And You Can Bust Through It As you slim down, your body releases much less leptin, the fullness hormone.  If you're not strength training, start right now. Building muscle can raise your metabolism to help you overcome a plateau. To keep your body challenged and burning calories, incorporate new moves and more intense intervals into your workouts or add another sweat session to your weekly routine. Alternatively, cut an extra 100 calories or so a day from your diet. Now that you've lost weight, your body simply doesn't need as much fuel.   Ways to cut 100 calories  1. Eat your eggs with hot sauce OR salsa instead of cheese.  Eggs are great for breakfast, but many people consider eggs and cheese to be BFFs. Instead of cheese-1 oz. of  cheddar has 114 calories-top your eggs with hot sauce, which contains no calories and helps with satiety and metabolism. Salsa is also a great option!!  2. Top your toast, waffles or pancakes with mashed berries instead of jelly or syrup. Half a cup of  berries-fresh, frozen or thawed-has about 40 calories, compared with 2 tbsp. of maple syrup or jelly, which both have about 100 calories. The berries will also give you a good punch of fiber, which helps keep you full and satisfied and won't spike blood sugar quickly like the jelly or syrup. 3. Swap the non-fat latte for black coffee with a splash of half-and-half. Contrary to its name, that non-fat latte has 130 calories and a startling 19g of carbohydrates per 16 oz. serving. Replacing that 'light' drinkable dessert with a black coffee with a splash of half-and-half saves you more than 100 calories per 16 oz. serving. 4. Sprinkle salads with freeze-dried raspberries instead of dried cranberries. If you want a sweet addition to your nutritious salad, stay away from dried cranberries. They have a whopping 130 calories per  cup and 30g carbohydrates. Instead, sprinkle freeze-dried raspberries guilt-free and save more than 100 calories per  cup serving, adding 3g of belly-filling fiber. 5. Go for mustard in place of mayo on your sandwich. Mustard can add really nice flavor to any sandwich, and there are tons of varieties, from spicy to honey. A serving of mayo is 95 calories, versus 10 calories in a serving of mustard. 6. Choose a DIY salad dressing instead of the store-bought kind. Mix Dijon or whole grain mustard with low-fat Kefir or red wine vinegar and garlic. 7. Use hummus as a spread instead of a dip. Use hummus as a spread on a high-fiber cracker or tortilla with a sandwich and save on calories without sacrificing taste. 8. Pick just one salad "accessory." Salad isn't automatically a calorie winner. It's easy to over-accessorize with toppings. Instead of topping your salad with nuts, avocado and cranberries (all three will clock in at 313 calories), just pick one. The next day, choose a different accessory, which will also keep your salad interesting. You don't wear all your jewelry every day,  right? 9. Ditch the white pasta in favor of spaghetti squash. One cup of cooked spaghetti squash has about 40 calories, compared with traditional spaghetti, which comes with more than 200. Spaghetti squash is also nutrient-dense. It's a good source of fiber and Vitamins A and C, and it can be eaten just like you would eat pasta-with a great tomato sauce and [place] meatballs or with pesto, tofu and spinach, for example. 10. Dress up your chili, soups and stews with non-fat [place] yogurt instead of sour cream. Just a 'dollop' of sour cream can set you back 115 calories and a whopping 12g of fat-seven of which are of the artery-clogging variety. Added bonus: [place] yogurt is packed with muscle-building protein, calcium and B Vitamins. 11. Mash cauliflower instead of mashed potatoes. One cup of traditional mashed potatoes-in all their creamy goodness-has more than 200 calories, compared to mashed cauliflower, which you can typically eat for less than 100 calories per 1 cup serving. Cauliflower is a great source of the antioxidant indole-3-carbinol (I3C), which may help reduce the risk of some cancers, like breast cancer. 12. Ditch the ice cream sundae in favor of a [place] yogurt parfait. Instead of a cup of ice cream or fro-yo for dessert, try 1 cup of nonfat Greek yogurt topped with fresh berries and a  sprinkle of cacao nibs. Both toppings are packed with antioxidants, which can help reduce cellular inflammation and oxidative damage. And the comparison is a no-brainer: One cup of ice cream has about 275 calories; one cup of frozen yogurt has about 230; and a cup of Greek yogurt has just 130, plus twice the protein, so you're less likely to return to the freezer for a second helping. 13. Put olive oil in a spray container instead of using it directly from the bottle. Each tablespoon of olive oil is 120 calories and 15g of fat. Use a mister instead of pouring it straight into the pan or onto a salad. This allows  for portion control and will save you more than 100 calories. 14. When baking, substitute canned pumpkin for butter or oil. Canned pumpkin-not pumpkin pie mix-is loaded with Vitamin A, which is important for skin and eye health, as well as immunity. And the comparisons are pretty crazy:  cup of canned pumpkin has about 40 calories, compared to butter or oil, which has more than 800 calories. Yes, 800 calories. Applesauce and mashed banana can also serve as good substitutions for butter or oil, usually in a 1:1 ratio. 15. Top casseroles with high-fiber cereal instead of breadcrumbs. Breadcrumbs are typically made with white bread, while breakfast cereals contain 5-9g of fiber per serving. Not only will you save more than 150 calories per  cup serving, the swap will also keep you more full and you'll get a metabolism boost from the added fiber. 16. Snack on pistachios instead of macadamia nuts. Believe it or not, you get the same amount of calories from 35 pistachios (100 calories) as you would from only five macadamia nuts. 17. Chow down on kale chips rather than potato chips. This is my favorite 'don't knock it 'till you try it' swap. Kale chips are so easy to make at home, and you can spice them up with a little grated parmesan or chili powder. Plus, they're a mere fraction of the calories of potato chips, but with the same crunch factor we crave so often. 18. Add seltzer and some fruit slices to your cocktail instead of soda or fruit juice. One cup of soda or fruit juice can pack on as much as 140 calories. Instead, use seltzer and fruit slices. The fruit provides valuable phytochemicals, such as flavonoids and anthocyanins, which help to combat cancer and stave off the aging process.

## 2014-07-26 LAB — LIPID PANEL
CHOL/HDL RATIO: 4.2 ratio
Cholesterol: 88 mg/dL (ref 0–200)
HDL: 21 mg/dL — ABNORMAL LOW (ref >=46)
LDL Cholesterol: 37 mg/dL (ref 0–99)
TRIGLYCERIDES: 149 mg/dL (ref <150)
VLDL: 30 mg/dL (ref 0–40)

## 2014-07-26 LAB — HEPATIC FUNCTION PANEL
ALBUMIN: 4.1 g/dL (ref 3.5–5.2)
ALK PHOS: 45 U/L (ref 39–117)
ALT: 39 U/L — ABNORMAL HIGH (ref 0–35)
AST: 38 U/L — ABNORMAL HIGH (ref 0–37)
BILIRUBIN TOTAL: 0.5 mg/dL (ref 0.2–1.2)
Bilirubin, Direct: 0.1 mg/dL (ref 0.0–0.3)
Indirect Bilirubin: 0.4 mg/dL (ref 0.2–1.2)
Total Protein: 6.8 g/dL (ref 6.0–8.3)

## 2014-07-26 LAB — URINALYSIS, ROUTINE W REFLEX MICROSCOPIC
BILIRUBIN URINE: NEGATIVE
Glucose, UA: 250 mg/dL — AB
Hgb urine dipstick: NEGATIVE
Ketones, ur: NEGATIVE mg/dL
Leukocytes, UA: NEGATIVE
Nitrite: NEGATIVE
PROTEIN: NEGATIVE mg/dL
Specific Gravity, Urine: 1.007 (ref 1.005–1.030)
Urobilinogen, UA: 0.2 mg/dL (ref 0.0–1.0)
pH: 7 (ref 5.0–8.0)

## 2014-07-26 LAB — MICROALBUMIN / CREATININE URINE RATIO
Creatinine, Urine: 82.6 mg/dL
MICROALB UR: 0.2 mg/dL (ref <2.0)
Microalb Creat Ratio: 2.4 mg/g (ref 0.0–30.0)

## 2014-07-26 LAB — MAGNESIUM: Magnesium: 1.5 mg/dL (ref 1.5–2.5)

## 2014-07-26 LAB — TSH: TSH: 1.765 u[IU]/mL (ref 0.350–4.500)

## 2014-07-26 LAB — BASIC METABOLIC PANEL WITH GFR
BUN: 15 mg/dL (ref 6–23)
CHLORIDE: 102 meq/L (ref 96–112)
CO2: 24 mEq/L (ref 19–32)
Calcium: 9.1 mg/dL (ref 8.4–10.5)
Creat: 1.06 mg/dL (ref 0.50–1.10)
GFR, EST NON AFRICAN AMERICAN: 58 mL/min — AB
GFR, Est African American: 67 mL/min
Glucose, Bld: 187 mg/dL — ABNORMAL HIGH (ref 70–99)
Potassium: 4.2 mEq/L (ref 3.5–5.3)
Sodium: 134 mEq/L — ABNORMAL LOW (ref 135–145)

## 2014-07-26 LAB — VITAMIN D 25 HYDROXY (VIT D DEFICIENCY, FRACTURES): VIT D 25 HYDROXY: 28 ng/mL — AB (ref 30–100)

## 2014-07-30 ENCOUNTER — Encounter: Payer: PRIVATE HEALTH INSURANCE | Admitting: *Deleted

## 2014-07-30 ENCOUNTER — Telehealth: Payer: Self-pay | Admitting: Cardiology

## 2014-07-30 NOTE — Telephone Encounter (Signed)
LMOVM reminding pt to send remote transmission.   

## 2014-08-01 ENCOUNTER — Encounter: Payer: Self-pay | Admitting: Cardiology

## 2014-08-07 ENCOUNTER — Other Ambulatory Visit: Payer: Self-pay | Admitting: Cardiovascular Disease

## 2014-08-07 MED ORDER — SPIRONOLACTONE 25 MG PO TABS: 25.0000 mg | ORAL_TABLET | Freq: Two times a day (BID) | ORAL | Status: DC

## 2014-08-07 MED ORDER — FENOFIBRATE MICRONIZED 134 MG PO CAPS: 134.0000 mg | ORAL_CAPSULE | Freq: Every day | ORAL | Status: DC

## 2014-08-07 NOTE — Telephone Encounter (Signed)
°  1. Which medications need to be refilled? Spironolactone,Fenobrate-been waiting for this about a week ago-need these today   2. Which pharmacy is medication to be sent to [facility]  3. Do they need a 30 day or 90 day supply? 90 and refills  4. Would they like a call back once the medication has been sent to the pharmacy? no

## 2014-08-07 NOTE — Telephone Encounter (Signed)
rx refilled.

## 2014-08-14 ENCOUNTER — Other Ambulatory Visit: Payer: Self-pay

## 2014-08-14 MED ORDER — CARVEDILOL 25 MG PO TABS: 25.0000 mg | ORAL_TABLET | Freq: Two times a day (BID) | ORAL | Status: DC

## 2014-08-14 NOTE — Telephone Encounter (Signed)
Rx(s) sent to pharmacy electronically.  

## 2014-09-02 ENCOUNTER — Other Ambulatory Visit: Payer: Self-pay

## 2014-09-10 ENCOUNTER — Other Ambulatory Visit: Payer: Self-pay | Admitting: *Deleted

## 2014-09-10 MED ORDER — LOSARTAN POTASSIUM 100 MG PO TABS: 100.0000 mg | ORAL_TABLET | Freq: Every day | ORAL | Status: DC

## 2014-09-10 NOTE — Telephone Encounter (Signed)
Rx(s) sent to pharmacy electronically.  

## 2014-11-19 ENCOUNTER — Other Ambulatory Visit: Payer: Self-pay | Admitting: Cardiovascular Disease

## 2014-12-12 ENCOUNTER — Encounter: Payer: Self-pay | Admitting: Internal Medicine

## 2014-12-12 ENCOUNTER — Ambulatory Visit (INDEPENDENT_AMBULATORY_CARE_PROVIDER_SITE_OTHER): Payer: PRIVATE HEALTH INSURANCE | Admitting: Internal Medicine

## 2014-12-12 VITALS — BP 122/80 | HR 80 | Temp 98.0°F | Resp 18 | Ht 67.0 in | Wt 229.0 lb

## 2014-12-12 DIAGNOSIS — L255 Unspecified contact dermatitis due to plants, except food: Secondary | ICD-10-CM

## 2014-12-12 DIAGNOSIS — L237 Allergic contact dermatitis due to plants, except food: Secondary | ICD-10-CM

## 2014-12-12 MED ORDER — PREDNISONE 20 MG PO TABS: ORAL_TABLET | ORAL | Status: DC

## 2014-12-12 MED ORDER — TRIAMCINOLONE ACETONIDE 0.1 % EX CREA: 1.0000 "application " | TOPICAL_CREAM | Freq: Three times a day (TID) | CUTANEOUS | Status: DC

## 2014-12-12 NOTE — Progress Notes (Signed)
   Subjective:    Patient ID: [name], female    DOB: 14-Jul-1955, 59 y.o.   MRN: [id number]  Rash Pertinent negatives include no fatigue, fever or vomiting.   Patient presents to the office for evaluation of rash which has been going on for the last 3 days.  She reports that she and her husband both have it and have been out in the yard.  She reports that she is itchy and she is very uncomfortable.  She has been using clorox water on it and also over the counter poison ivy cream.  She is very susceptible to poison ivy and poison oak.     Review of Systems  Constitutional: Negative for fever, chills and fatigue.  Gastrointestinal: Negative for nausea and vomiting.  Skin: Positive for rash.  Psychiatric/Behavioral: Negative for agitation.       Objective:   Physical Exam  Constitutional: She is oriented to person, place, and time. She appears well-developed and well-nourished.  HENT:  Head: Normocephalic.  Mouth/Throat: Oropharynx is clear and moist.  Cardiovascular: Normal rate, regular rhythm, normal heart sounds and intact distal pulses.  Exam reveals no gallop and no friction rub.   No murmur heard. Pulmonary/Chest: Effort normal and breath sounds normal. No respiratory distress. She has no wheezes. She has no rales. She exhibits no tenderness.  Abdominal: Soft. Bowel sounds are normal.  Musculoskeletal: Normal range of motion.  Neurological: She is alert and oriented to person, place, and time.  Skin: Skin is warm and dry. Rash noted. She is not diaphoretic. There is erythema.     Psychiatric: She has a normal mood and affect. Her behavior is normal. Judgment and thought content normal.  Nursing note and vitals reviewed.         Assessment & Plan:    1. Plant allergic contact dermatitis  - predniSONE (DELTASONE) 20 MG tablet; 3 tabs po daily x 3 days, then 2 tabs x 3 days, then 1.5 tabs x 3 days, then 1 tab x 3 days, then 0.5 tabs x 3 days  Dispense: 27 tablet;  Refill: 0 - triamcinolone cream (KENALOG) 0.1 %; Apply 1 application topically 3 (three) times daily.  Dispense: 45 g; Refill: 0

## 2014-12-17 ENCOUNTER — Other Ambulatory Visit: Payer: Self-pay | Admitting: Internal Medicine

## 2014-12-17 ENCOUNTER — Ambulatory Visit (INDEPENDENT_AMBULATORY_CARE_PROVIDER_SITE_OTHER): Payer: PRIVATE HEALTH INSURANCE | Admitting: Internal Medicine

## 2014-12-17 ENCOUNTER — Encounter: Payer: Self-pay | Admitting: Internal Medicine

## 2014-12-17 VITALS — BP 132/74 | HR 76 | Temp 98.0°F | Resp 18

## 2014-12-17 DIAGNOSIS — B372 Candidiasis of skin and nail: Secondary | ICD-10-CM | POA: Diagnosis not present

## 2014-12-17 MED ORDER — NYSTATIN 100000 UNIT/GM EX CREA: 1.0000 "application " | TOPICAL_CREAM | Freq: Three times a day (TID) | CUTANEOUS | Status: DC

## 2014-12-17 MED ORDER — DOXYCYCLINE HYCLATE 100 MG PO CAPS: 100.0000 mg | ORAL_CAPSULE | Freq: Two times a day (BID) | ORAL | Status: DC

## 2014-12-17 NOTE — Patient Instructions (Signed)
Cutaneous Candidiasis °Cutaneous candidiasis is a condition in which there is an overgrowth of yeast (candida) on the skin. Yeast normally live on the skin, but in small enough numbers not to cause any symptoms. In certain cases, increased growth of the yeast may cause an actual yeast infection. This kind of infection usually occurs in areas of the skin that are constantly warm and moist, such as the armpits or the groin. Yeast is the most common cause of diaper rash in babies and in people who cannot control their bowel movements (incontinence). °CAUSES  °The fungus that most often causes cutaneous candidiasis is Candida albicans. Conditions that can increase the risk of getting a yeast infection of the skin include: °· Obesity. °· Pregnancy. °· Diabetes. °· Taking antibiotic medicine. °· Taking birth control pills. °· Taking steroid medicines. °· Thyroid disease. °· An iron or zinc deficiency. °· Problems with the immune system. °SYMPTOMS  °· Red, swollen area of the skin. °· Bumps on the skin. °· Itchiness. °DIAGNOSIS  °The diagnosis of cutaneous candidiasis is usually based on its appearance. Light scrapings of the skin may also be taken and viewed under a microscope to identify the presence of yeast. °TREATMENT  °Antifungal creams may be applied to the infected skin. In severe cases, oral medicines may be needed.  °HOME CARE INSTRUCTIONS  °· Keep your skin clean and dry. °· Maintain a healthy weight. °· If you have diabetes, keep your blood sugar under control. °SEEK IMMEDIATE MEDICAL CARE IF: °· Your rash continues to spread despite treatment. °· You have a fever, chills, or abdominal pain. °  °This information is not intended to replace advice given to you by your health care provider. Make sure you discuss any questions you have with your health care provider. °  °Document Released: 11/10/2010 Document Revised: 05/17/2011 Document Reviewed: 08/26/2014 °Elsevier Interactive Patient Education ©2016 [employer] ° °

## 2014-12-17 NOTE — Progress Notes (Signed)
   Subjective:    Patient ID: [name], female    DOB: 1956/01/30, 59 y.o.   MRN: [id number]  Poison [place] This is a new problem. The current episode started 1 to 4 weeks ago. The problem is unchanged. Location: right breast. The rash is characterized by itchiness, redness and burning. She was exposed to nothing. Pertinent negatives include no anorexia, congestion, cough, fatigue, fever, shortness of breath or vomiting. Past treatments include antihistamine, topical steroids and oral steroids. The treatment provided no relief. There is no history of allergies, asthma, eczema or varicella.   She reports very minimal improvement despite taking oral steroids and also using topical cream.  Husbands rash is completely better.  Vessicles are now gone.      Review of Systems  Constitutional: Negative for fever, chills and fatigue.  HENT: Negative for congestion.   Respiratory: Negative for cough and shortness of breath.   Cardiovascular: Negative for chest pain and palpitations.  Gastrointestinal: Negative for nausea, vomiting and anorexia.  Skin: Positive for rash. Negative for color change, pallor and wound.       Objective:   Physical Exam  Constitutional: She appears well-developed and well-nourished. No distress.  HENT:  Head: Normocephalic.  Mouth/Throat: Oropharynx is clear and moist. No oropharyngeal exudate.  Eyes: Conjunctivae are normal. No scleral icterus.  Neck: Normal range of motion. Neck supple. No JVD present. No thyromegaly present.  Cardiovascular: Normal rate, regular rhythm, normal heart sounds and intact distal pulses.   Pulmonary/Chest: Effort normal and breath sounds normal. No respiratory distress. She has no wheezes. She has no rales. She exhibits no tenderness.  Abdominal: Soft. Bowel sounds are normal. She exhibits no distension and no mass. There is no tenderness. There is no rebound and no guarding.  Lymphadenopathy:    She has no cervical adenopathy.    Skin: She is not diaphoretic.     Nursing note and vitals reviewed.   Filed Vitals:   12/17/14 1004  BP: 132/74  Pulse: 76  Temp: 98 F (36.7 C)  Resp: 18         Assessment & Plan:    1. Candidal skin infection -not improving with treatment for contact dermatitis although now does appear more consistent with yeast dermatitis.  Patient with anaphylactic reaction to antifungals so will use nystatin cream and continue steroids for itching.  Patient to also be given doxycycline for coverage for infection given diabetic status and itching.  Patient to see derm if no improvement with rash with new treatment.

## 2014-12-22 ENCOUNTER — Encounter (HOSPITAL_COMMUNITY): Payer: Self-pay | Admitting: *Deleted

## 2014-12-22 ENCOUNTER — Encounter: Payer: Self-pay | Admitting: Student

## 2014-12-22 ENCOUNTER — Ambulatory Visit (INDEPENDENT_AMBULATORY_CARE_PROVIDER_SITE_OTHER): Payer: PRIVATE HEALTH INSURANCE | Admitting: Family Medicine

## 2014-12-22 ENCOUNTER — Observation Stay (HOSPITAL_COMMUNITY)
Admission: EM | Admit: 2014-12-22 | Discharge: 2014-12-23 | Disposition: A | Discharge status: Home / Self Care | Payer: PRIVATE HEALTH INSURANCE | Attending: Family Medicine | Admitting: Family Medicine

## 2014-12-22 VITALS — BP 110/70 | HR 78 | Temp 98.2°F | Resp 19 | Ht 65.5 in | Wt 228.0 lb

## 2014-12-22 DIAGNOSIS — Z794 Long term (current) use of insulin: Secondary | ICD-10-CM | POA: Insufficient documentation

## 2014-12-22 DIAGNOSIS — K219 Gastro-esophageal reflux disease without esophagitis: Secondary | ICD-10-CM | POA: Diagnosis not present

## 2014-12-22 DIAGNOSIS — I10 Essential (primary) hypertension: Secondary | ICD-10-CM | POA: Insufficient documentation

## 2014-12-22 DIAGNOSIS — E785 Hyperlipidemia, unspecified: Secondary | ICD-10-CM | POA: Diagnosis not present

## 2014-12-22 DIAGNOSIS — L237 Allergic contact dermatitis due to plants, except food: Secondary | ICD-10-CM | POA: Diagnosis not present

## 2014-12-22 DIAGNOSIS — E119 Type 2 diabetes mellitus without complications: Secondary | ICD-10-CM | POA: Diagnosis not present

## 2014-12-22 DIAGNOSIS — Z87891 Personal history of nicotine dependence: Secondary | ICD-10-CM | POA: Insufficient documentation

## 2014-12-22 DIAGNOSIS — Z950 Presence of cardiac pacemaker: Secondary | ICD-10-CM | POA: Insufficient documentation

## 2014-12-22 DIAGNOSIS — R21 Rash and other nonspecific skin eruption: Secondary | ICD-10-CM | POA: Diagnosis not present

## 2014-12-22 DIAGNOSIS — E1165 Type 2 diabetes mellitus with hyperglycemia: Secondary | ICD-10-CM | POA: Insufficient documentation

## 2014-12-22 DIAGNOSIS — F329 Major depressive disorder, single episode, unspecified: Secondary | ICD-10-CM | POA: Diagnosis not present

## 2014-12-22 DIAGNOSIS — G4733 Obstructive sleep apnea (adult) (pediatric): Secondary | ICD-10-CM | POA: Diagnosis not present

## 2014-12-22 DIAGNOSIS — E781 Pure hyperglyceridemia: Secondary | ICD-10-CM | POA: Diagnosis not present

## 2014-12-22 DIAGNOSIS — I5022 Chronic systolic (congestive) heart failure: Secondary | ICD-10-CM | POA: Diagnosis not present

## 2014-12-22 LAB — BASIC METABOLIC PANEL
Anion gap: 8 (ref 5–15)
BUN: 14 mg/dL (ref 6–20)
CHLORIDE: 98 mmol/L — AB (ref 101–111)
CO2: 24 mmol/L (ref 22–32)
Calcium: 8.5 mg/dL — ABNORMAL LOW (ref 8.9–10.3)
Creatinine, Ser: 0.89 mg/dL (ref 0.44–1.00)
GFR calc Af Amer: >60 mL/min (ref >60)
GLUCOSE: 283 mg/dL — AB (ref 65–99)
POTASSIUM: 4.1 mmol/L (ref 3.5–5.1)
Sodium: 130 mmol/L — ABNORMAL LOW (ref 135–145)

## 2014-12-22 LAB — CBC
HCT: 39 % (ref 36.0–46.0)
Hemoglobin: 12.8 g/dL (ref 12.0–15.0)
MCH: 29.5 pg (ref 26.0–34.0)
MCHC: 32.8 g/dL (ref 30.0–36.0)
MCV: 89.9 fL (ref 78.0–100.0)
PLATELETS: 237 10*3/uL (ref 150–400)
RBC: 4.34 MIL/uL (ref 3.87–5.11)
RDW: 13.5 % (ref 11.5–15.5)
WBC: 13.1 10*3/uL — ABNORMAL HIGH (ref 4.0–10.5)

## 2014-12-22 LAB — COMPREHENSIVE METABOLIC PANEL
ALT: 27 U/L (ref 6–29)
AST: 20 U/L (ref 10–35)
Albumin: 3.7 g/dL (ref 3.6–5.1)
Alkaline Phosphatase: 51 U/L (ref 33–130)
BUN: 17 mg/dL (ref 7–25)
CO2: 28 mmol/L (ref 20–31)
Calcium: 9.4 mg/dL (ref 8.6–10.4)
Chloride: 83 mmol/L — ABNORMAL LOW (ref 98–110)
Creat: 0.88 mg/dL (ref 0.50–1.05)
Glucose, Bld: 285 mg/dL — ABNORMAL HIGH (ref 65–99)
Potassium: 3.8 mmol/L (ref 3.5–5.3)
Sodium: 130 mmol/L — ABNORMAL LOW (ref 135–146)
Total Bilirubin: 0.6 mg/dL (ref 0.2–1.2)
Total Protein: 6.4 g/dL (ref 6.1–8.1)

## 2014-12-22 LAB — GLUCOSE, CAPILLARY
GLUCOSE-CAPILLARY: 263 mg/dL — AB (ref 65–99)
Glucose-Capillary: 395 mg/dL — ABNORMAL HIGH (ref 65–99)

## 2014-12-22 LAB — POCT CBC
Granulocyte percent: 65.5 %G (ref 37–80)
HCT, POC: 41.7 % (ref 37.7–47.9)
Hemoglobin: 14.1 g/dL (ref 12.2–16.2)
Lymph, poc: 3.5 — AB (ref 0.6–3.4)
MCH, POC: 29.6 pg (ref 27–31.2)
MCHC: 33.9 g/dL (ref 31.8–35.4)
MCV: 87.4 fL (ref 80–97)
MID (cbc): 1.1 — AB (ref 0–0.9)
MPV: 7 fL (ref 0–99.8)
POC Granulocyte: 8.6 — AB (ref 2–6.9)
POC LYMPH PERCENT: 26.4 %L (ref 10–50)
POC MID %: 8.1 %M (ref 0–12)
Platelet Count, POC: 271 10*3/uL (ref 142–424)
RBC: 4.77 M/uL (ref 4.04–5.48)
RDW, POC: 14.2 %
WBC: 13.1 10*3/uL — AB (ref 4.6–10.2)

## 2014-12-22 LAB — POCT SKIN KOH: Skin KOH, POC: NEGATIVE

## 2014-12-22 LAB — POCT SEDIMENTATION RATE: POCT SED RATE: 11 mm/hr (ref 0–22)

## 2014-12-22 LAB — GLUCOSE, POCT (MANUAL RESULT ENTRY): POC Glucose: 312 mg/dl — AB (ref 70–99)

## 2014-12-22 MED ORDER — DIPHENHYDRAMINE HCL 50 MG/ML IJ SOLN
25.0000 mg | Freq: Once | INTRAMUSCULAR | Status: AC
  Administered 2014-12-22: 25 mg via INTRAVENOUS
  Filled 2014-12-22: qty 1

## 2014-12-22 MED ORDER — CEPHALEXIN 500 MG PO CAPS: 500.0000 mg | ORAL_CAPSULE | Freq: Four times a day (QID) | ORAL | Status: DC

## 2014-12-22 MED ORDER — ROSUVASTATIN CALCIUM 10 MG PO TABS
40.0000 mg | ORAL_TABLET | Freq: Every day | ORAL | Status: DC
  Administered 2014-12-22: 40 mg via ORAL
  Filled 2014-12-22: qty 4

## 2014-12-22 MED ORDER — ENOXAPARIN SODIUM 40 MG/0.4ML ~~LOC~~ SOLN
40.0000 mg | SUBCUTANEOUS | Status: DC
  Administered 2014-12-22: 40 mg via SUBCUTANEOUS
  Filled 2014-12-22: qty 0.4

## 2014-12-22 MED ORDER — IMIPRAMINE HCL 50 MG PO TABS
25.0000 mg | ORAL_TABLET | Freq: Every day | ORAL | Status: DC
Start: 2014-12-22 — End: 2014-12-23
  Administered 2014-12-22: 25 mg via ORAL
  Filled 2014-12-22: qty 1

## 2014-12-22 MED ORDER — SODIUM CHLORIDE 0.9 % IV SOLN: 250.0000 mL | INTRAVENOUS | Status: DC | PRN

## 2014-12-22 MED ORDER — PREDNISONE 20 MG PO TABS: 50.0000 mg | ORAL_TABLET | Freq: Every day | ORAL | Status: DC

## 2014-12-22 MED ORDER — ACETAMINOPHEN 325 MG PO TABS: 650.0000 mg | ORAL_TABLET | Freq: Four times a day (QID) | ORAL | Status: DC | PRN

## 2014-12-22 MED ORDER — IBUPROFEN 200 MG PO TABS: 600.0000 mg | ORAL_TABLET | Freq: Four times a day (QID) | ORAL | Status: DC | PRN

## 2014-12-22 MED ORDER — VERAPAMIL HCL ER 180 MG PO TBCR
180.0000 mg | EXTENDED_RELEASE_TABLET | Freq: Every day | ORAL | Status: DC
  Administered 2014-12-22: 180 mg via ORAL
  Filled 2014-12-22 (×2): qty 1

## 2014-12-22 MED ORDER — PANTOPRAZOLE SODIUM 40 MG PO TBEC: 40.0000 mg | DELAYED_RELEASE_TABLET | Freq: Every day | ORAL | Status: DC | PRN

## 2014-12-22 MED ORDER — INSULIN ASPART 100 UNIT/ML ~~LOC~~ SOLN
0.0000 [IU] | Freq: Every day | SUBCUTANEOUS | Status: DC
  Administered 2014-12-22: 5 [IU] via SUBCUTANEOUS

## 2014-12-22 MED ORDER — INSULIN ASPART 100 UNIT/ML ~~LOC~~ SOLN
0.0000 [IU] | Freq: Three times a day (TID) | SUBCUTANEOUS | Status: DC
  Administered 2014-12-22: 8 [IU] via SUBCUTANEOUS
  Administered 2014-12-23: 15 [IU] via SUBCUTANEOUS
  Administered 2014-12-23: 11 [IU] via SUBCUTANEOUS
  Administered 2014-12-23: 15 [IU] via SUBCUTANEOUS

## 2014-12-22 MED ORDER — ACETAMINOPHEN 650 MG RE SUPP: 650.0000 mg | Freq: Four times a day (QID) | RECTAL | Status: DC | PRN

## 2014-12-22 MED ORDER — FENOFIBRATE 54 MG PO TABS
54.0000 mg | ORAL_TABLET | Freq: Every day | ORAL | Status: DC
  Filled 2014-12-22: qty 1

## 2014-12-22 MED ORDER — ASPIRIN EC 81 MG PO TBEC
81.0000 mg | DELAYED_RELEASE_TABLET | Freq: Every day | ORAL | Status: DC
  Administered 2014-12-22: 81 mg via ORAL
  Filled 2014-12-22 (×3): qty 1

## 2014-12-22 MED ORDER — INSULIN ASPART PROT & ASPART (70-30 MIX) 100 UNIT/ML ~~LOC~~ SUSP
60.0000 [IU] | Freq: Two times a day (BID) | SUBCUTANEOUS | Status: DC
  Administered 2014-12-22 – 2014-12-23 (×2): 60 [IU] via SUBCUTANEOUS
  Filled 2014-12-22: qty 10

## 2014-12-22 MED ORDER — LOSARTAN POTASSIUM 50 MG PO TABS
100.0000 mg | ORAL_TABLET | Freq: Every day | ORAL | Status: DC
  Filled 2014-12-22: qty 2

## 2014-12-22 MED ORDER — SODIUM CHLORIDE 0.9 % IJ SOLN: 3.0000 mL | INTRAMUSCULAR | Status: DC | PRN

## 2014-12-22 MED ORDER — SODIUM CHLORIDE 0.9 % IJ SOLN
3.0000 mL | Freq: Two times a day (BID) | INTRAMUSCULAR | Status: DC
  Administered 2014-12-22 – 2014-12-23 (×2): 3 mL via INTRAVENOUS

## 2014-12-22 MED ORDER — TORSEMIDE 20 MG PO TABS
20.0000 mg | ORAL_TABLET | ORAL | Status: DC
  Administered 2014-12-22: 20 mg via ORAL
  Filled 2014-12-22: qty 1

## 2014-12-22 MED ORDER — SPIRONOLACTONE 25 MG PO TABS
25.0000 mg | ORAL_TABLET | Freq: Two times a day (BID) | ORAL | Status: DC
  Administered 2014-12-22: 25 mg via ORAL
  Filled 2014-12-22 (×4): qty 1

## 2014-12-22 MED ORDER — FAMOTIDINE IN NACL 20-0.9 MG/50ML-% IV SOLN
20.0000 mg | Freq: Once | INTRAVENOUS | Status: AC
  Administered 2014-12-22: 20 mg via INTRAVENOUS
  Filled 2014-12-22: qty 50

## 2014-12-22 MED ORDER — METHYLPREDNISOLONE SODIUM SUCC 125 MG IJ SOLR
125.0000 mg | Freq: Once | INTRAMUSCULAR | Status: AC
  Administered 2014-12-22: 125 mg via INTRAVENOUS
  Filled 2014-12-22: qty 2

## 2014-12-22 MED ORDER — NYSTATIN 100000 UNIT/GM EX CREA
1.0000 "application " | TOPICAL_CREAM | Freq: Three times a day (TID) | CUTANEOUS | Status: DC
  Administered 2014-12-23: 1 via TOPICAL
  Filled 2014-12-22: qty 15

## 2014-12-22 MED ORDER — CEFAZOLIN SODIUM 1-5 GM-% IV SOLN
1.0000 g | Freq: Three times a day (TID) | INTRAVENOUS | Status: DC
  Administered 2014-12-22 – 2014-12-23 (×2): 1 g via INTRAVENOUS
  Filled 2014-12-22 (×5): qty 50

## 2014-12-22 MED ORDER — SODIUM CHLORIDE 0.9 % IV BOLUS (SEPSIS)
1000.0000 mL | Freq: Once | INTRAVENOUS | Status: AC
  Administered 2014-12-22: 1000 mL via INTRAVENOUS

## 2014-12-22 MED ORDER — DIPHENHYDRAMINE HCL 25 MG PO CAPS
25.0000 mg | ORAL_CAPSULE | Freq: Four times a day (QID) | ORAL | Status: DC | PRN
  Administered 2014-12-22 – 2014-12-23 (×3): 25 mg via ORAL
  Filled 2014-12-22 (×3): qty 1

## 2014-12-22 MED ORDER — PREDNISONE 20 MG PO TABS
60.0000 mg | ORAL_TABLET | Freq: Every day | ORAL | Status: DC
  Administered 2014-12-22 – 2014-12-23 (×2): 60 mg via ORAL
  Filled 2014-12-22 (×2): qty 3

## 2014-12-22 MED ORDER — CARVEDILOL 25 MG PO TABS
25.0000 mg | ORAL_TABLET | Freq: Two times a day (BID) | ORAL | Status: DC
  Administered 2014-12-22 – 2014-12-23 (×2): 25 mg via ORAL
  Filled 2014-12-22 (×2): qty 1

## 2014-12-22 NOTE — Progress Notes (Signed)
Rt note: CPAP set up w nasal mask on auto titrate min 4 max 10. IV team is trying to get line at this time and pt states she can place herself on when ready

## 2014-12-22 NOTE — Progress Notes (Signed)
This is a 59 year old woman with 2 weeks of rash. When it first began on her chest she was thought to have poison sumac and was started on prednisone. The rash continued to spread, primarily on her right torso but also involving her right cheek. The pain is increasing as is the itching.  The distribution of the rash now involves her right back right shoulder, right inframammary area, right axilla, and to a lesser extent, right cheek. She had a low-grade temperature couple days ago.  Patient has had cellulitis in the past and this reminds her of that. She notes that the rash, well read on the surface, is becoming "thicker"  Patient has not changed any of her medications recently. She has no chest pain, oral sores, shortness of breath, or dysuria.  Objective: Articulate woman who appears ill but in no acute distress  BP 110/70 mmHg  Pulse 78  Temp(Src) 98.2 F (36.8 C) (Oral)  Resp 19  Ht 5' 5.5" (1.664 m)  Wt 228 lb (103.42 kg)  BMI 37.35 kg/m2  SpO2 98% Chest is clear Heart is regular without murmur Oropharynx clear Conjunctivae show no injection Neck is supple no adenopathy Skin: Patient has an erythematous rash which is starting to become deeper in color over the right shoulder. Same rash exists below the breasts, primarily on the right as well as the right axilla where there is some scaling and sharp demarcation. She has mild erythema in her right cheek. The predominance of all the rashes on the right.  Skin scraping:  Negative KOH Results for orders placed or performed in visit on 12/22/14  POCT CBC  Result Value Ref Range   WBC 13.1 (A) 4.6 - 10.2 K/uL   Lymph, poc 3.5 (A) 0.6 - 3.4   POC LYMPH PERCENT 26.4 10 - 50 %L   MID (cbc) 1.1 (A) 0 - 0.9   POC MID % 8.1 0 - 12 %M   POC Granulocyte 8.6 (A) 2 - 6.9   Granulocyte percent 65.5 37 - 80 %G   RBC 4.77 4.04 - 5.48 M/uL   Hemoglobin 14.1 12.2 - 16.2 g/dL   HCT, POC 11.6 57.9 - 47.9 %   MCV 87.4 80 - 97 fL   MCH, POC 29.6  27 - 31.2 pg   MCHC 33.9 31.8 - 35.4 g/dL   RDW, POC 03.8 %   Platelet Count, POC 271 142 - 424 K/uL   MPV 7.0 0 - 99.8 fL  POCT glucose (manual entry)  Result Value Ref Range   POC Glucose 312 (A) 70 - 99 mg/dl  POCT Skin KOH  Result Value Ref Range   Skin KOH, POC Negative      Assessment:  Progressive spreading confluent skin rash c/w erysepilas  Plan: With a degree of sickness, the trajectory of the illness, and the diagnosis, patient needs hospitalization with IV antibiotic and close observation for the next 24-48 hours.  I have directed her to admitting at [facility] for [facility]. I'm contact the family practice service to see if they will take her.  Signed, Sheila Oats.D.

## 2014-12-22 NOTE — ED Provider Notes (Signed)
CSN: [id number]     Arrival date & time 12/22/14  1128 History   First MD Initiated Contact with Patient 12/22/14 1203     Chief Complaint  Patient presents with  . Rash   HPI  Ms. Karim is a 59 year old female with PMHx of HTN, DM and CHF presenting with rash. Pt states she has had a rash for the past 10 days. She believes she got the rash from her husband who had contact dermatitis from poison sumac. Pt states the rash began on her right elbow and has spread to axilla, chest, bilateral breasts and back. Rash is mostly concentrated on the right side. She states the rash is extremely pruritic and "painful like a sunburn". She reports subjective fevers at home but has not taken her temperature. She has been seen at [facility] 3 times for same rash. She has been treated with prednisone and kenalog initially. Nystatin was added for yeast infection under breast. Doxycycline was added for possible superinfection. Pt most recently seen by Dr. Aaron Mose this morning who recommended pt come to ED for admission for IV abx to treat possible erysipelas. Denies facial swelling, difficulty swallowing, sore throat, chest pain, SOB, abdominal pain, nausea,, vomiting or diarrhea.   Past Medical History  Diagnosis Date  . Hyperlipidemia   . Hypertension 06/25/2011    Renal dopplers - superior mesenteric artery >50% diameter reduction; R renal artery - normal patency; L proximal renal artery 1-59% reduction (lower end of scale); both kidneys normal in size/symmetry with normal cortex and medulla  . Diabetes mellitus   . Automatic implantable cardiac defibrillator in situ     [employer] MODEL 7122  . Unspecified sleep apnea   . Leg pain 12/09/2010    doppler of R femoral artery - no evidence of dissection, AV fistula, pseudoaneurysm or other vascular abnormalities  . Sleep apnea     on CPAP - AHI during sleep study was 44  . Obesity   . Type II or unspecified type diabetes mellitus without mention  of complication, not stated as uncontrolled   . Type II or unspecified type diabetes mellitus without mention of complication, not stated as uncontrolled   . CHF (congestive heart failure) (HCC)   . Migraines    Past Surgical History  Procedure Laterality Date  . Abdominal hysterectomy    . Neck surgery    . Pacemaker insertion    . Cardiac defibrillator placement      ICD by Dr. Ladona Ridgel  . Knee surgery    . Doppler echocardiography  06/25/2011    EF >55%; moderate concentric LV hypertrophy; LA mildly dilated, mild mitral annular calcification;   . Cardiovascular stress test  08/28/2010    R/P MV - pattern of normal perfusion in all regions, no scintigraphic evidence of inducible myocardial ischemia; no EKG changes; normal perfusion study; pt did experience chesst pain during strudy, resolved spontaneously  . Cardiac catheterization  07/15/2006    no significant CAD by cardiac cath, confirmed by intravascular [place] of LAD; non-ischemic cardiomyopathy prob related to uncontrolled hypertension, DM and morbid obesity; moderate pulmonary hypertension; preserved cardiac output and cardiac index  . Biv pacemaker generator change out N/A 01/16/2014    Procedure: BIV PACEMAKER GENERATOR CHANGE OUT;  Surgeon: Marinus Maw, MD;  Location: [facility] CATH LAB;  Service: Cardiovascular;  Laterality: N/A;   Family History  Problem Relation Age of Onset  . Heart attack Father   . Hearing loss Father   .  Hypertension Father   . Cancer Mother    Social History  Substance Use Topics  . Smoking status: Former Smoker    Types: Cigarettes    Quit date: 03/09/1975  . Smokeless tobacco: None  . Alcohol Use: 0.0 oz/week    0 Standard drinks or equivalent per week   OB History    No data available     Review of Systems  Constitutional: Positive for fatigue. Negative for chills and diaphoresis.  HENT: Negative for facial swelling, sore throat and trouble swallowing.   Eyes: Negative for pain, redness and  itching.  Respiratory: Negative for shortness of breath.   Cardiovascular: Negative for chest pain.  Gastrointestinal: Negative for nausea, vomiting and abdominal pain.  Skin: Positive for rash.  Allergic/Immunologic: Negative for environmental allergies and food allergies.  Neurological: Negative for headaches.  All other systems reviewed and are negative.     Allergies  Flagyl; Niacin and related; Penicillins; and Yeast-related products  Home Medications   Prior to Admission medications   Medication Sig Start Date End Date Taking? Authorizing Provider  aspirin 81 MG tablet Take 162 mg by mouth at bedtime.    Yes Historical Provider, MD  aspirin-acetaminophen-caffeine (EXCEDRIN MIGRAINE) [phone] MG per tablet Take 2 tablets by mouth daily as needed for migraine.   Yes Historical Provider, MD  carvedilol (COREG) 25 MG tablet TAKE 1 TABLET (25 MG TOTAL) BY MOUTH 2 (TWO) TIMES DAILY WITH A MEAL 11/19/14  Yes Lennette Bihari, MD  CRESTOR 40 MG tablet TAKE 1 TABLET BY MOUTH EVERY NIGHT AT BEDTIME 06/26/14  Yes Marinus Maw, MD  diazepam (VALIUM) 5 MG tablet Take 1 tablet (5 mg total) by mouth at bedtime as needed for anxiety. Patient taking differently: Take 2.5-5 mg by mouth daily as needed for anxiety.  02/12/13  Yes Quentin Mulling, PA-C  doxycycline (VIBRAMYCIN) 100 MG capsule Take 1 capsule (100 mg total) by mouth 2 (two) times daily. One po bid x 7 days 12/17/14  Yes Courtney Forcucci, PA-C  estradiol (ESTRACE) 2 MG tablet Take 2 mg by mouth daily.   Yes Historical Provider, MD  fenofibrate micronized (LOFIBRA) 134 MG capsule Take 1 capsule (134 mg total) by mouth daily before breakfast. 08/07/14  Yes Lennette Bihari, MD  imipramine (TOFRANIL) 25 MG tablet TAKE 1 TO 2 TABLETS BY MOUTH EVERY DAY Patient taking differently: TAKE 1 TABLETS BY MOUTH EVERY DAY AT BEDTIME 11/01/13  Yes Quentin Mulling, PA-C  insulin aspart protamine- aspart (NOVOLOG MIX 70/30) (70-30) 100 UNIT/ML injection  Inject 60 Units into the skin 2 (two) times daily with a meal. Inject 60 units in a.m. And 60 units in p.m.   Yes Historical Provider, MD  loratadine (CLARITIN) 10 MG tablet Take 10 mg by mouth daily.    Yes Historical Provider, MD  losartan (COZAAR) 100 MG tablet Take 1 tablet (100 mg total) by mouth daily. 09/10/14  Yes Lennette Bihari, MD  metFORMIN (GLUCOPHAGE-XR) 500 MG 24 hr tablet Take 1,000 mg by mouth 2 (two) times daily. 11/25/14  Yes Historical Provider, MD  nystatin cream (MYCOSTATIN) Apply 1 application topically 3 (three) times daily. Apply to affected area every 4-6 hours x 10 days 12/17/14  Yes Courtney Forcucci, PA-C  pantoprazole (PROTONIX) 40 MG tablet Take 40 mg by mouth daily as needed (For acid reflux).    Yes Historical Provider, MD  Potassium 99 MG TABS Take 1 tablet by mouth daily.   Yes Historical Provider, MD  spironolactone (ALDACTONE) 25 MG tablet TAKE 1 TABLET (25 MG TOTAL) BY MOUTH 2 (TWO) TIMES DAILY. 11/19/14  Yes Lennette Bihari, MD  torsemide (DEMADEX) 20 MG tablet Take 1 tablet (20 mg total) by mouth every other day. Patient taking differently: Take 20 mg by mouth 3 (three) times a week.  05/06/14  Yes Lennette Bihari, MD  triamcinolone cream (KENALOG) 0.1 % APPLY EXTERNALLY TO THE AFFECTED AREA THREE TIMES DAILY 12/17/14  Yes Courtney Forcucci, PA-C  verapamil (CALAN SR) 180 MG CR tablet Take 1 tablet (180 mg total) by mouth at bedtime. 07/08/14  Yes Lennette Bihari, MD  ibuprofen (ADVIL,MOTRIN) 600 MG tablet Take 1 tablet (600 mg total) by mouth every 8 (eight) hours as needed. Patient not taking: Reported on 12/22/2014 02/22/14   Jonita Albee, MD  predniSONE (DELTASONE) 20 MG tablet 3 tabs po daily x 3 days, then 2 tabs x 3 days, then 1.5 tabs x 3 days, then 1 tab x 3 days, then 0.5 tabs x 3 days Patient not taking: Reported on 12/22/2014 12/12/14   Toni Amend Forcucci, PA-C   BP 113/57 mmHg  Pulse 84  Temp(Src) 97.7 F (36.5 C) (Oral)  Resp 16  Ht  (1.702 m)  Wt  227 lb 6 oz (103.137 kg)  BMI 35.60 kg/m2  SpO2 97% Physical Exam  Constitutional: She appears well-developed and well-nourished. No distress.  HENT:  Head: Normocephalic and atraumatic.  Eyes: Conjunctivae are normal. Right eye exhibits no discharge. Left eye exhibits no discharge. No scleral icterus.  Neck: Normal range of motion.  Cardiovascular: Normal rate and regular rhythm.   Pulmonary/Chest: Effort normal. No respiratory distress.  Musculoskeletal: Normal range of motion.  Neurological: She is alert. Coordination normal.  Skin: Skin is warm and dry. Rash noted. There is erythema.  Erythematous macular papular rash distributed over right shoulder, arm, axilla and bilateral breasts. Erythema is more demarcated under the axilla with some skin breakdown. Yeast infection under bilateral breasts. Erythematous rash present over back; appears to cross midline but is more prominent on right. Rash is not vesicular, weeping or pustular. No warmth to touch. Mildly tender on palpation. No skin sloughing.   Psychiatric: She has a normal mood and affect. Her behavior is normal.  Nursing note and vitals reviewed.   ED Course  Procedures (including critical care time) Labs Review Labs Reviewed  CBC - Abnormal; Notable for the following:    WBC 13.1 (*)    All other components within normal limits  BASIC METABOLIC PANEL - Abnormal; Notable for the following:    Sodium 130 (*)    Chloride 98 (*)    Glucose, Bld 283 (*)    Calcium 8.5 (*)    All other components within normal limits  GLUCOSE, CAPILLARY - Abnormal; Notable for the following:    Glucose-Capillary 263 (*)    All other components within normal limits  CBC - Abnormal; Notable for the following:    WBC 23.3 (*)    All other components within normal limits  BASIC METABOLIC PANEL - Abnormal; Notable for the following:    Chloride 99 (*)    Glucose, Bld 368 (*)    Creatinine, Ser 1.06 (*)    GFR calc non Af Amer 57 (*)    All  other components within normal limits  GLUCOSE, CAPILLARY - Abnormal; Notable for the following:    Glucose-Capillary 395 (*)    All other components within normal limits  GLUCOSE, CAPILLARY -  Abnormal; Notable for the following:    Glucose-Capillary 337 (*)    All other components within normal limits  GLUCOSE, CAPILLARY - Abnormal; Notable for the following:    Glucose-Capillary 376 (*)    All other components within normal limits    Imaging Review No results found. I have personally reviewed and evaluated these images and lab results as part of my medical decision-making.   EKG Interpretation None      MDM   Final diagnoses:  Rash   Pt presenting with 2 week history of rash. Rash is pruritic, painful and spreading. She has had unsuccessful treatment with prednisone, kenalog, nystatin and doxycycline. Pt seen by Dr. Aaron Mose this morning who recommends admission for IV abx for possible erysipelas. VSS. Erythematous rash noted over right arm, axilla, back and bilateral breasts with yeast infection inframammary. No weeping, vesicles or pustules. Consulted family medicine resident who will admit Ms. Quach for IV ancef and obs.   Alveta Heimlich, PA-C 12/23/14 1252  Bethann Berkshire, MD 12/23/14 2114

## 2014-12-22 NOTE — H&P (Signed)
Family Medicine Teaching [facility] Admission History and Physical Service Pager: [phone]  Patient name: [name] Medical record number: [id number] Date of birth: 06/04/1955 Age: 59 y.o. Gender: female  Primary Care Provider: Nadean Corwin, MD Consultants: None Code Status: Full  Chief Complaint:  Rash  Assessment and Plan: [name] is a 59 y.o. female presenting with rash x2 weeks after contact with poison sumac/poison ivy . PMH is significant for HLD, CHF with pacemaker in place, DM, CKD, OSA, Migraine   Dermatitis- History of physical exam finding suggest a rhus dermatitis with possible super infection. Incomplete treatment and " worsening" but rash spreading to other surfaces consistent with rhus dermatitis. Significantly she had a very short course of steroid therapy and standard of care for significant dermatitis due to contact from plant irritants is 2-3 weeks with slow taper. Sudden cessation of course would put her at risk for " rebound" rash (1. [name] and [name]). She does however have a WBC to 13.1 and reports feeling febrile at home. Symptoms consistent with rhus dermatitis with potential bacterial superinfection - Admit to teaching service, attending Dr Randolm Idol  - Ancef overnight, transition oral in AM as long as remains afebrile. Pt adamantly refused oral abx and demanded IV antibiotics despite recomendations for oral antibiotics given NON-TOXIC appearance - Will start Prednisone 60 mg qD to be slowly tapered over 3 weeks - cool compresses over irritated areas - blood culture if fever  - tylenol PRN fever, pain  DM - Last A1c  11.3 07/2013 in [facility] system, On Novolog 70/30 60 units BID at home  - Continue home Novololg - SSI mod - CBGs ACHS  HTN- Stable - Continue home verapamil, losartan,   CHF-Last echo 09/2013 60-65%, stable with no evidence of fluid overload - continue home coreg, spironolactone, tosemide, aspirin  Depression/Anxiety-  Stable - continue home imipramine,   HLD/hypertriglyceridemia - continue home Crestor, fenofibrate  OSA - continue home CPAP qHS  GERD - continue home protonix   FEN/GI: Carb mod/hearthealthy diet, SLIV Prophylaxis: Lovenox ppx  Disposition: Admit to teaching service, Dr Randolm Idol Attending  History of Present Illness:  [name] is a 59 y.o. female presenting with rash for the last 2 weeks. She states her husband was doing yard work two weeks ago when he developed severe rash, that he was treated for as poison sumac/poison ivy dermatitis. Prior to his developing the rash she had contact with him Later that day she developed itching and redness below her right breast, this rash then began to spear down her right arm.  She presented to her doctor who started her on oral prednisone and topical steroids She took one week of a 12 day course of the steroids and stopped because she felt they were not helping and made her blood sugars go " from the 170s-300s". During this time the rash began to spread to her right shoulder and back. She then returned to urgent care where she was started on doxycline and again felt that the rash was spreading and the doxycycline wasn't working so stopped taking it She now feels the rash has spread to her cheek. The rash sometimes feels burning in nature, " like a sun burn" and she has been scratchting vigorously She has washed her clothes, sheets and wash cloths and towels multiple times but delayed from the initial exposure She reports feeling feverish yesterday with chills but did not take her temperature. Denies SOB, chest pain, nausea/emesis/diarrhea, abd pain  Review  Of Systems: Per HPI   Patient Active Problem List   Diagnosis Date Noted  . Rash 12/22/2014  . Rash and nonspecific skin eruption   . Chronic systolic congestive heart failure (HCC)   . Obesity 07/25/2014  . GERD (gastroesophageal reflux disease) 07/25/2014  . Generalized anxiety  disorder 07/25/2014  . 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 OSA on CPAP 07/09/2014  . Biventricular cardiac pacemaker in situ 01/16/2014  . 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 Migraines   . Nonischemic cardiomyopathy (HCC) 08/30/2012  . Diastolic dysfunction 08/30/2012  . Chronic systolic heart failure (HCC) 08/04/2010  . HYPERCHOLESTEROLEMIA 03/17/2009  . Essential hypertension 03/17/2009   Past Medical History: Past Medical History  Diagnosis Date  . Hyperlipidemia   . Hypertension 06/25/2011    Renal dopplers - superior mesenteric artery >50% diameter reduction; R renal artery - normal patency; L proximal renal artery 1-59% reduction (lower end of scale); both kidneys normal in size/symmetry with normal cortex and medulla  . Diabetes mellitus   . Automatic implantable cardiac defibrillator in situ     [employer] MODEL 7122  . Unspecified sleep apnea   . Leg pain 12/09/2010    doppler of R femoral artery - no evidence of dissection, AV fistula, pseudoaneurysm or other vascular abnormalities  . Sleep apnea     on CPAP - AHI during sleep study was 44  . Obesity   . Type II or unspecified type diabetes mellitus without mention of complication, not stated as uncontrolled   . Type II or unspecified type diabetes mellitus without mention of complication, not stated as uncontrolled   . CHF (congestive heart failure) (HCC)   . Migraines    Past Surgical History: Past Surgical History  Procedure Laterality Date  . Abdominal hysterectomy    . Neck surgery    . Pacemaker insertion    . Cardiac defibrillator placement      ICD by Dr. Ladona Ridgel  . Knee surgery    . Doppler echocardiography  06/25/2011    EF >55%; moderate concentric LV hypertrophy; LA mildly dilated, mild mitral annular calcification;   . Cardiovascular stress test  08/28/2010    R/P MV - pattern of normal perfusion in all regions, no  scintigraphic evidence of inducible myocardial ischemia; no EKG changes; normal perfusion study; pt did experience chesst pain during strudy, resolved spontaneously  . Cardiac catheterization  07/15/2006    no significant CAD by cardiac cath, confirmed by intravascular [place] of LAD; non-ischemic cardiomyopathy prob related to uncontrolled hypertension, DM and morbid obesity; moderate pulmonary hypertension; preserved cardiac output and cardiac index  . Biv pacemaker generator change out N/A 01/16/2014    Procedure: BIV PACEMAKER GENERATOR CHANGE OUT;  Surgeon: Marinus Maw, MD;  Location: [facility] CATH LAB;  Service: Cardiovascular;  Laterality: N/A;   Social History: Social History  Substance Use Topics  . Smoking status: Former Smoker    Types: Cigarettes    Quit date: 03/09/1975  . Smokeless tobacco: None  . Alcohol Use: 0.0 oz/week    0 Standard drinks or equivalent per week   Additional social history: denies etoh, tobacco Please also refer to relevant sections of EMR.  Family History: Family History  Problem Relation Age of Onset  . Heart attack Father   . Hearing loss Father   . Hypertension Father   . Cancer Mother     Medications:  No current facility-administered  medications on file prior to encounter.   Current Outpatient Prescriptions on File Prior to Encounter  Medication Sig Dispense Refill  . aspirin 81 MG tablet Take 162 mg by mouth at bedtime.     Marland Kitchen aspirin-acetaminophen-caffeine (EXCEDRIN MIGRAINE) 250-250-65 MG per tablet Take 2 tablets by mouth daily as needed for migraine.    . carvedilol (COREG) 25 MG tablet TAKE 1 TABLET (25 MG TOTAL) BY MOUTH 2 (TWO) TIMES DAILY WITH A MEAL 180 tablet 1  . CRESTOR 40 MG tablet TAKE 1 TABLET BY MOUTH EVERY NIGHT AT BEDTIME 90 tablet 1  . diazepam (VALIUM) 5 MG tablet Take 1 tablet (5 mg total) by mouth at bedtime as needed for anxiety. (Patient taking differently: Take 2.5-5 mg by mouth daily as needed for anxiety. ) 30 tablet 0  .  doxycycline (VIBRAMYCIN) 100 MG capsule Take 1 capsule (100 mg total) by mouth 2 (two) times daily. One po bid x 7 days 14 capsule 0  . estradiol (ESTRACE) 2 MG tablet Take 2 mg by mouth daily.    . fenofibrate micronized (LOFIBRA) 134 MG capsule Take 1 capsule (134 mg total) by mouth daily before breakfast. 90 capsule 3  . imipramine (TOFRANIL) 25 MG tablet TAKE 1 TO 2 TABLETS BY MOUTH EVERY DAY (Patient taking differently: TAKE 1 TABLETS BY MOUTH EVERY DAY AT BEDTIME) 180 tablet PRN  . insulin aspart protamine- aspart (NOVOLOG MIX 70/30) (70-30) 100 UNIT/ML injection Inject 60 Units into the skin 2 (two) times daily with a meal. Inject 60 units in a.m. And 60 units in p.m.    Marland Kitchen loratadine (CLARITIN) 10 MG tablet Take 10 mg by mouth daily.     Marland Kitchen losartan (COZAAR) 100 MG tablet Take 1 tablet (100 mg total) by mouth daily. 90 tablet 3  . nystatin cream (MYCOSTATIN) Apply 1 application topically 3 (three) times daily. Apply to affected area every 4-6 hours x 10 days 30 g 1  . pantoprazole (PROTONIX) 40 MG tablet Take 40 mg by mouth daily as needed (For acid reflux).     . Potassium 99 MG TABS Take 1 tablet by mouth daily.    Marland Kitchen spironolactone (ALDACTONE) 25 MG tablet TAKE 1 TABLET (25 MG TOTAL) BY MOUTH 2 (TWO) TIMES DAILY. 180 tablet 1  . torsemide (DEMADEX) 20 MG tablet Take 1 tablet (20 mg total) by mouth every other day. (Patient taking differently: Take 20 mg by mouth 3 (three) times a week. ) 45 tablet 0  . triamcinolone cream (KENALOG) 0.1 % APPLY EXTERNALLY TO THE AFFECTED AREA THREE TIMES DAILY 45 g 0  . verapamil (CALAN SR) 180 MG CR tablet Take 1 tablet (180 mg total) by mouth at bedtime. 90 tablet 3  . ibuprofen (ADVIL,MOTRIN) 600 MG tablet Take 1 tablet (600 mg total) by mouth every 8 (eight) hours as needed. (Patient not taking: Reported on 12/22/2014) 30 tablet 0  . predniSONE (DELTASONE) 20 MG tablet 3 tabs po daily x 3 days, then 2 tabs x 3 days, then 1.5 tabs x 3 days, then 1 tab x 3  days, then 0.5 tabs x 3 days (Patient not taking: Reported on 12/22/2014) 27 tablet 0    Objective: BP 107/67 mmHg  Pulse 79  Temp(Src) 97.2 F (36.2 C) (Oral)  Resp 14  Ht  (1.702 m)  Wt 227 lb 6 oz (103.137 kg)  BMI 35.60 kg/m2  SpO2 99% Exam: General: NAD, sitting in hospital bed HEENT: PERRL, no conjunctival drainage or  erythema, mild erythema of right cheek, no lesions of oropharynx Cardiovascular: RRR, no murmurs auscultated Respiratory: CTAB, normal WOB Abdomen: soft, non tender, + BS Skin: maculopapular rash of right chest, axilla. Maculopapular rash in linear pattern over right arm. Erythematous rash over back, more on right aspect extending to mid back. No warmth, mild tenderness to palpation. No vesicles or bullae noted Extremities: No LE edema or tenderness, no rash noted over lower extremities Neuro: no focal deficits Psych: normal mood and affect  Labs and Imaging: CBC BMET   Recent Labs Lab 12/22/14 0942  WBC 13.1*  HGB 14.1  HCT 41.7    Recent Labs Lab 12/22/14 0929  NA 130*  K 3.8  CL 83*  CO2 28  BUN 17  CREATININE 0.88  GLUCOSE 285*  CALCIUM 9.4       1. [name] NP1, [facility]. Poison ivy, oak, and sumac dermatitis. [facility]. 1999 Nov-Dec;171(5-6):354-5.   Bonney Aid, MD 12/22/2014, 3:33 PM PGY-2 [facility] FPTS Intern pager: [phone], text pages welcome

## 2014-12-22 NOTE — ED Notes (Signed)
Pt reports having painful, burning red rash to right side of back, shoulder, breast and abd x 2 weeks. Has been to pcp and was told to come here for further tx.

## 2014-12-23 DIAGNOSIS — Z794 Long term (current) use of insulin: Secondary | ICD-10-CM

## 2014-12-23 DIAGNOSIS — E1165 Type 2 diabetes mellitus with hyperglycemia: Secondary | ICD-10-CM | POA: Diagnosis not present

## 2014-12-23 DIAGNOSIS — R21 Rash and other nonspecific skin eruption: Secondary | ICD-10-CM | POA: Diagnosis not present

## 2014-12-23 LAB — BASIC METABOLIC PANEL
Anion gap: 10 (ref 5–15)
BUN: 16 mg/dL (ref 6–20)
CHLORIDE: 99 mmol/L — AB (ref 101–111)
CO2: 28 mmol/L (ref 22–32)
Calcium: 9.2 mg/dL (ref 8.9–10.3)
Creatinine, Ser: 1.06 mg/dL — ABNORMAL HIGH (ref 0.44–1.00)
GFR calc Af Amer: >60 mL/min (ref >60)
GFR calc non Af Amer: 57 mL/min — ABNORMAL LOW (ref >60)
Glucose, Bld: 368 mg/dL — ABNORMAL HIGH (ref 65–99)
POTASSIUM: 4.7 mmol/L (ref 3.5–5.1)
SODIUM: 137 mmol/L (ref 135–145)

## 2014-12-23 LAB — CBC
HEMATOCRIT: 41.4 % (ref 36.0–46.0)
HEMOGLOBIN: 14 g/dL (ref 12.0–15.0)
MCH: 30.1 pg (ref 26.0–34.0)
MCHC: 33.8 g/dL (ref 30.0–36.0)
MCV: 89 fL (ref 78.0–100.0)
Platelets: 273 10*3/uL (ref 150–400)
RBC: 4.65 MIL/uL (ref 3.87–5.11)
RDW: 13.7 % (ref 11.5–15.5)
WBC: 23.3 10*3/uL — ABNORMAL HIGH (ref 4.0–10.5)

## 2014-12-23 LAB — GLUCOSE, CAPILLARY
GLUCOSE-CAPILLARY: 337 mg/dL — AB (ref 65–99)
GLUCOSE-CAPILLARY: 376 mg/dL — AB (ref 65–99)

## 2014-12-23 MED ORDER — PREDNISONE 20 MG PO TABS: 60.0000 mg | ORAL_TABLET | Freq: Every day | ORAL | Status: DC

## 2014-12-23 MED ORDER — CEPHALEXIN 500 MG PO CAPS: 500.0000 mg | ORAL_CAPSULE | Freq: Two times a day (BID) | ORAL | Status: DC

## 2014-12-23 MED ORDER — DIPHENHYDRAMINE HCL 25 MG PO CAPS: 25.0000 mg | ORAL_CAPSULE | Freq: Four times a day (QID) | ORAL | Status: DC | PRN

## 2014-12-23 MED ORDER — INSULIN ASPART PROT & ASPART (70-30 MIX) 100 UNIT/ML ~~LOC~~ SUSP: 63.0000 [IU] | Freq: Two times a day (BID) | SUBCUTANEOUS | Status: DC

## 2014-12-23 MED ORDER — INSULIN ASPART PROT & ASPART (70-30 MIX) 100 UNIT/ML ~~LOC~~ SUSP: 65.0000 [IU] | Freq: Two times a day (BID) | SUBCUTANEOUS | Status: DC

## 2014-12-23 NOTE — Progress Notes (Signed)
Family Medicine Teaching Service Daily Progress Note Intern Pager: [phone]  Patient name: [name] Medical record number: [id number] Date of birth: Nov 24, 1955 Age: 59 y.o. Gender: female  Primary Care Provider: Nadean Corwin, MD Consultants: None Code Status: Full  Pt Overview and Major Events to Date:    Assessment and Plan:  [name] is a 59 y.o. female presenting with rash x2 weeks after contact with poison sumac/poison ivy . PMH is significant for HLD, CHF with pacemaker in place, DM, CKD, OSA, Migraine  Dermatitis- History of physical exam finding suggest a rhus dermatitis with possible super infection. Incomplete treatment and " worsening" but rash spreading to other surfaces consistent with rhus dermatitis. Significantly she had a very short course of steroid therapy and standard of care for significant dermatitis due to contact from plant irritants is 2-3 weeks with slow taper. Sudden cessation of course would put her at risk for " rebound" rash (1. [name] and [name]). She does however have a WBC to 13.1 and reports feeling febrile at home. Symptoms consistent with rhus dermatitis with potential bacterial superinfection - WBC 13.1 > 23.3 - Ancef overnight, transition oral in AM as long as remains afebrile, will transition to keflex  - Received single dose of Solumedrol yesterday, will continue Prednisone 60 mg qD, will provide two weeks of prednisone  - cool compresses over irritated areas - Patient received Benadryl 25 mg PO q6h, states that this helped with the itching - tylenol PRN fever, pain  DM - Last A1c 11.3 07/2013 in [facility] system, On Novolog 70/30 60 units BID at home  - Continue home Novololg - Will increase 70/30 by 5 units to help control. - SSI mod - CBGs ACHS  HTN- Stable - Continue home verapamil, losartan,   CHF-Last echo 09/2013 60-65%, stable with no evidence of fluid overload - continue home coreg, spironolactone, tosemide,  aspirin  Depression/Anxiety- Stable - continue home imipramine,   HLD/hypertriglyceridemia  - continue home Crestor, fenofibrate  OSA - continue home CPAP qHS  GERD - continue home protonix   FEN/GI: Carb mod/hearthealthy diet, SLIV Prophylaxis: Lovenox ppx  Disposition: Admit to teaching service  Subjective:  Patient doing well this Am. Having some itching   Objective: Temp:  [97.2 F (36.2 C)-98.3 F (36.8 C)] 97.7 F (36.5 C) (10/17 0641) Pulse Rate:  [78-93] 84 (10/17 0641) Resp:  [14-19] 16 (10/17 0641) BP: (107-139)/(57-75) 113/57 mmHg (10/17 0641) SpO2:  [96 %-99 %] 97 % (10/17 0641) Weight:  [227 lb 6 oz (103.137 kg)-228 lb (103.42 kg)] 227 lb 6 oz (103.137 kg) (10/16 1136) Physical Exam: General: NAD, sitting in hospital bed Cardiovascular: RRR, no murmurs auscultated Respiratory: CTAB, normal WOB Abdomen: soft, non tender, + BS Skin: maculopapular rash of right chest, axilla. Maculopapular rash in linear pattern over right arm. Erythematous rash over back, more on right aspect extending to mid back. No warmth, mild tenderness to palpation. No vesicles or bullae noted Extremities: No LE edema or tenderness, no rash noted over lower extremities  Laboratory:  Recent Labs Lab 12/22/14 0942 12/22/14 1655 12/23/14 0340  WBC 13.1* 13.1* 23.3*  HGB 14.1 12.8 14.0  HCT 41.7 39.0 41.4  PLT  --  237 273    Recent Labs Lab 12/22/14 0929 12/22/14 1655 12/23/14 0340  NA 130* 130* 137  K 3.8 4.1 4.7  CL 83* 98* 99*  CO2 BUN CREATININE 0.88 0.89 1.06*  CALCIUM 9.4 8.5* 9.2  PROT 6.4  --   --   BILITOT 0.6  --   --   ALKPHOS 51  --   --   ALT 27  --   --   AST 20  --   --   GLUCOSE 285* 283* 368*     Imaging/Diagnostic Tests: No results found.   [name] [name], MD 12/23/2014, 7:22 AM PGY-1, [facility] FPTS Intern pager: [phone], text pages welcome

## 2014-12-23 NOTE — Discharge Instructions (Signed)
You were admitted for a severe dermatitis. Will provide a keflex for 5 days ( until 12/28/2014). Can take Benadryl for itching as needed. Please take Prednisone 60 mg, 3 tablets, for a week and then take 40 mg of prednisone (2 tablets) for another). Please take prednisone at breakfast time to avoid sleep disturbances. Please follow up with your primary care physician in about a week to have a look at the rash. I have increased your 70/30 insulin from 60 to 65 units, as discussed please follow up with your endocrinologist in a week to readjust for blood sugars as needed.

## 2014-12-23 NOTE — Discharge Summary (Signed)
Family Medicine Teaching [facility] Discharge Summary  Patient name: [name] Medical record number: [id number] Date of birth: 1955-10-03 Age: 59 y.o. Gender: female Date of Admission: 12/22/2014  Date of Discharge: 12/24/2014  Admitting Physician: Uvaldo Rising, MD  Primary Care Provider: Nadean Corwin, MD Consultants:   Indication for Hospitalization: Severe Reaction to Poison Sumac/Poison Ivy   Discharge Diagnoses/Problem List:  T2DM  HTN  CHF  Depression/Anxiety  HLD OSA   GERD  Disposition: Home   Discharge Condition: Stable   Discharge Exam:  General: NAD, sitting in hospital bed Cardiovascular: RRR, no murmurs auscultated Respiratory: CTAB, normal WOB Abdomen: soft, non tender, + BS Skin: maculopapular rash of right chest, axilla. Maculopapular rash in linear pattern over right arm. Erythematous rash over back, more on right aspect extending to mid back. No warmth, mild tenderness to palpation. No vesicles or bullae noted Extremities: No LE edema or tenderness, no rash noted over lower extremities  Brief Hospital Course:  Patient presenting with 2-3 week history of rash. Patient initially had symptoms after coming in contact with her husband who had exposure to poison ivy/sumac. Initially had rash on right elbow, started on steroids by PCP, rash worsened and spread to back, bilateral breasts, and chest. After a week Doxycycline was added to the regimen to treat suspect superinfection. Symptoms continued to worsen, no associated fevers or chills, blood sugars elevated on steroids. Patient was admitted, received  24 hours of IV ancef, a single dose of solumedrol, and prednisone 60 mg the following day. Patient was observed overnight, and felt that her rash was improving by the following day. She felt ready to for discharge at the time.   Issues for Follow Up:  1. Patient to receive 5 days of keflex until 12/28/2014 2. Provided Benadryl for itching as  needed 3. Patient to receive Prednisone 60 mg for 1 week, followed by 2nd week of 40 mg of prednisone  4. Patient's dosing of 70/30 insulin increased to 65 units to compensate for 2 weeks of steroids.  5. Patient to follow up with endocrinologist as need to adjust blood sugars 6. Recheck CBG at office visit  7. Revaluate rash as needed to determine if improvement    Significant Procedures: None   Significant Labs and Imaging:   Recent Labs Lab 12/22/14 0942 12/22/14 1655 12/23/14 0340  WBC 13.1* 13.1* 23.3*  HGB 14.1 12.8 14.0  HCT 41.7 39.0 41.4  PLT  --  237 273    Recent Labs Lab 12/22/14 0929 12/22/14 1655 12/23/14 0340  NA 130* 130* 137  K 3.8 4.1 4.7  CL 83* 98* 99*  CO2 28 24 28   GLUCOSE 285* 283* 368*  BUN 17 14 16   CREATININE 0.88 0.89 1.06*  CALCIUM 9.4 8.5* 9.2  ALKPHOS 51  --   --   AST 20  --   --   ALT 27  --   --   ALBUMIN 3.7  --   --     Results/Tests Pending at Time of Discharge: None   Discharge Medications:    Medication List    STOP taking these medications        doxycycline 100 MG capsule  Commonly known as:  VIBRAMYCIN      TAKE these medications        aspirin 81 MG tablet  Take 162 mg by mouth at bedtime.     aspirin-acetaminophen-caffeine 250-250-65 MG tablet  Commonly known as:  EXCEDRIN MIGRAINE  Take  2 tablets by mouth daily as needed for migraine.     carvedilol 25 MG tablet  Commonly known as:  COREG  TAKE 1 TABLET (25 MG TOTAL) BY MOUTH 2 (TWO) TIMES DAILY WITH A MEAL     cephALEXin 500 MG capsule  Commonly known as:  KEFLEX  Take 1 capsule (500 mg total) by mouth 2 (two) times daily.     CRESTOR 40 MG tablet  Generic drug:  rosuvastatin  TAKE 1 TABLET BY MOUTH EVERY NIGHT AT BEDTIME     diazepam 5 MG tablet  Commonly known as:  VALIUM  Take 1 tablet (5 mg total) by mouth at bedtime as needed for anxiety.     diphenhydrAMINE 25 mg capsule  Commonly known as:  BENADRYL  Take 1 capsule (25 mg total) by  mouth every 6 (six) hours as needed for itching.     estradiol 2 MG tablet  Commonly known as:  ESTRACE  Take 2 mg by mouth daily.     fenofibrate micronized 134 MG capsule  Commonly known as:  LOFIBRA  Take 1 capsule (134 mg total) by mouth daily before breakfast.     ibuprofen 600 MG tablet  Commonly known as:  ADVIL,MOTRIN  Take 1 tablet (600 mg total) by mouth every 8 (eight) hours as needed.     imipramine 25 MG tablet  Commonly known as:  TOFRANIL  TAKE 1 TO 2 TABLETS BY MOUTH EVERY DAY     insulin aspart protamine- aspart (70-30) 100 UNIT/ML injection  Commonly known as:  NOVOLOG MIX 70/30  Inject 0.65 mLs (65 Units total) into the skin 2 (two) times daily with a meal. Inject 60 units in a.m. And 60 units in p.m.     loratadine 10 MG tablet  Commonly known as:  CLARITIN  Take 10 mg by mouth daily.     losartan 100 MG tablet  Commonly known as:  COZAAR  Take 1 tablet (100 mg total) by mouth daily.     metFORMIN 500 MG 24 hr tablet  Commonly known as:  GLUCOPHAGE-XR  Take 1,000 mg by mouth 2 (two) times daily.     nystatin cream  Commonly known as:  MYCOSTATIN  Apply 1 application topically 3 (three) times daily. Apply to affected area every 4-6 hours x 10 days     pantoprazole 40 MG tablet  Commonly known as:  PROTONIX  Take 40 mg by mouth daily as needed (For acid reflux).     Potassium 99 MG Tabs  Take 1 tablet by mouth daily.     predniSONE 20 MG tablet  Commonly known as:  DELTASONE  Take 3 tablets (60 mg total) by mouth daily with breakfast. 60 mg, 3 tablets for 1 week and 40 mg 2 tablets for 1 week     spironolactone 25 MG tablet  Commonly known as:  ALDACTONE  TAKE 1 TABLET (25 MG TOTAL) BY MOUTH 2 (TWO) TIMES DAILY.     torsemide 20 MG tablet  Commonly known as:  DEMADEX  Take 1 tablet (20 mg total) by mouth every other day.     triamcinolone cream 0.1 %  Commonly known as:  KENALOG  APPLY EXTERNALLY TO THE AFFECTED AREA THREE TIMES DAILY      verapamil 180 MG CR tablet  Commonly known as:  CALAN SR  Take 1 tablet (180 mg total) by mouth at bedtime.        Discharge Instructions: Please refer to Patient  Instructions section of EMR for full details.  Patient was counseled important signs and symptoms that should prompt return to medical care, changes in medications, dietary instructions, activity restrictions, and follow up appointments.   Follow-Up Appointments: Follow-up Information    Follow up with MCKEOWN,WILLIAM DAVID, MD. Schedule an appointment as soon as possible for a visit in 4 days.   Specialty:  Internal Medicine   Why:  Hospital follow-up   Contact information:   [address] [place] [address] [phone]       Berton Bon, MD 12/24/2014, 2:16 PM PGY-1, [facility]

## 2015-01-27 ENCOUNTER — Encounter: Payer: Self-pay | Admitting: Physician Assistant

## 2015-01-27 ENCOUNTER — Ambulatory Visit (INDEPENDENT_AMBULATORY_CARE_PROVIDER_SITE_OTHER): Payer: BLUE CROSS/BLUE SHIELD | Admitting: Physician Assistant

## 2015-01-27 VITALS — BP 110/60 | HR 88 | Temp 97.3°F | Resp 16 | Ht 65.5 in | Wt 230.0 lb

## 2015-01-27 DIAGNOSIS — I10 Essential (primary) hypertension: Secondary | ICD-10-CM

## 2015-01-27 DIAGNOSIS — IMO0002 Reserved for concepts with insufficient information to code with codable children: Secondary | ICD-10-CM

## 2015-01-27 DIAGNOSIS — E1322 Other specified diabetes mellitus with diabetic chronic kidney disease: Secondary | ICD-10-CM

## 2015-01-27 DIAGNOSIS — N182 Chronic kidney disease, stage 2 (mild): Secondary | ICD-10-CM | POA: Diagnosis not present

## 2015-01-27 DIAGNOSIS — I429 Cardiomyopathy, unspecified: Secondary | ICD-10-CM

## 2015-01-27 DIAGNOSIS — I5022 Chronic systolic (congestive) heart failure: Secondary | ICD-10-CM | POA: Diagnosis not present

## 2015-01-27 DIAGNOSIS — E7800 Pure hypercholesterolemia, unspecified: Secondary | ICD-10-CM

## 2015-01-27 DIAGNOSIS — E1365 Other specified diabetes mellitus with hyperglycemia: Secondary | ICD-10-CM | POA: Diagnosis not present

## 2015-01-27 DIAGNOSIS — I428 Other cardiomyopathies: Secondary | ICD-10-CM

## 2015-01-27 NOTE — Patient Instructions (Signed)
Diabetes is a very complicated disease...lets simplify it.  An easy way to look at it to understand the complications is if you think of the extra sugar floating in your blood stream as glass shards floating through your blood stream.    Diabetes affects your small vessels first: 1) The glass shards (sugar) scraps down the tiny blood vessels in your eyes and lead to diabetic retinopathy, the leading cause of blindness in the [place]. Diabetes is the leading cause of newly diagnosed adult (20 to 58 years of age) blindness in the [place].  2) The glass shards scratches down the tiny vessels of your legs leading to nerve damage called neuropathy and can lead to amputations of your feet. More than 60% of all non-traumatic amputations of lower limbs occur in people with diabetes.  3) Over time the small vessels in your brain are shredded and closed off, individually this does not cause any problems but over a long period of time many of the small vessels being blocked can lead to Vascular Dementia.   4) Your kidney's are a filter system and have a "net" that keeps certain things in the body and lets bad things out. Sugar shreds this net and leads to kidney damage and eventually failure. Decreasing the sugar that is destroying the net and certain blood pressure medications can help stop or decrease progression of kidney disease. Diabetes was the primary cause of kidney failure in 44 percent of all new cases in 2011.  5) Diabetes also destroys the small vessels in your penis that lead to erectile dysfunction. Eventually the vessels are so damaged that you may not be responsive to cialis or viagra.   Diabetes and your large vessels: Your larger vessels consist of your coronary arteries in your heart and the carotid vessels to your brain. Diabetes or even increased sugars put you at 300% increased risk of heart attack and stroke and this is why.. The sugar scrapes down your large blood vessels and your body  sees this as an internal injury and tries to repair itself. Just like you get a scab on your skin, your platelets will stick to the blood vessel wall trying to heal it. This is why we have diabetics on low dose aspirin daily, this prevents the platelets from sticking and can prevent plaque formation. In addition, your body takes cholesterol and tries to shove it into the open wound. This is why we want your LDL, or bad cholesterol, below 70.   The combination of platelets and cholesterol over 5-10 years forms plaque that can break off and cause a heart attack or stroke.   PLEASE REMEMBER:  Diabetes is preventable! Up to 85 percent of complications and morbidities among individuals with type 2 diabetes can be prevented, delayed, or effectively treated and minimized with regular visits to a health professional, appropriate monitoring and medication, and a healthy diet and lifestyle.  Recommendations For Diabetic/Prediabetic Patients:   -  Take medications as prescribed  -  Recommend Dr Joel Fuhrman's book "The End of Diabetes "  And "The End of Dieting"- Can get at  www.Amazon.com and encourage also get the Audio CD book  - AVOID Animal products, ie. Meat - red/white, Poultry and Dairy/especially cheese - Exercise at least 5 times a week for 30 minutes or preferably daily.  - No Smoking - Drink less than 2 drinks a day.  - Monitor your feet for sores - Have yearly Eye Exams - Recommend annual Flu vaccine  -   Recommend Pneumovax and Prevnar vaccines - Shingles Vaccine (Zostavax) if over 60 y.o.  Goals:   - BMI less than 24 - Fasting sugar less than 130 or less than 150 if tapering medicines to lose weight  - Systolic BP less than 130  - Diastolic BP less than 80 - Bad LDL Cholesterol less than 70 - Triglycerides less than 150     Bad carbs also include fruit juice, alcohol, and sweet tea. These are empty calories that do not signal to your brain that you are full.   Please remember the  good carbs are still carbs which convert into sugar. So please measure them out no more than 1/2-1 cup of rice, oatmeal, pasta, and beans  Veggies are however free foods! Pile them on.   Not all fruit is created equal. Please see the list below, the fruit at the bottom is higher in sugars than the fruit at the top. Please avoid all dried fruits.     

## 2015-01-27 NOTE — Progress Notes (Signed)
Assessment and Plan:  1. Hypertension -Continue medication, monitor blood pressure at home. Continue DASH diet.  Reminder to go to the ER if any CP, SOB, nausea, dizziness, severe HA, changes vision/speech, left arm numbness and tingling and jaw pain.  2. Cholesterol -Continue diet and exercise. Check cholesterol.   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 Check A1C  4. Vitamin D Def - check level and continue medications.   5. Obesity with co morbidities - long discussion about weight loss, diet, and exercise   Continue diet and meds as discussed. Further disposition after labs, got labs at Dr. Talmage Nap. Discussed med's effects and SE's.    Over 30 minutes of exam, counseling, chart review, and critical decision making was performed   HPI 59 y.o. female  presents for 3 month follow up on hypertension, cholesterol, diabetes and vitamin D deficiency.   Her blood pressure has been controlled at home, today her BP is BP: 110/60 mmHg.  She does workout. She denies chest pain, shortness of breath, dizziness. 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 Tresa Endo. She is on BB, ARB, spirolactone, and torsemide. She had AICD changed to pacemaker in November of this year after discussing with Dr. Tresa Endo has had normalization of her EF with medication optimization.  She is on cholesterol medication, crestor  and fenofibrate and denies myalgias. Her cholesterol is at goal. The cholesterol was:   Lab Results  Component Value Date   CHOL 88 07/25/2014   HDL 21* 07/25/2014   LDLCALC 37 07/25/2014   TRIG 149 07/25/2014   CHOLHDL 4.2 07/25/2014    She has been working on diet and exercise for diabetes with diabetic chronic kidney disease, with other circulatory complications and with diabetic polyneuropathy. She was recently hospitalized for severe allergic reaction to poison sumac in Oct, had prednisone that has increased her  sugars, she states things are going back to normal. She follows with Dr. Talmage Nap for uncontrolled DM, she is on metformin and novolog 70/30 65 and 65, sugars in the 200's.  she is on bASA, she is on ACE/ARB, and denies  polydipsia and polyuria. Last A1C was:  Lab Results  Component Value Date   HGBA1C 11.3* 07/24/2013  Last GFR: Lab Results  Component Value Date   GFRNONAA 57* 12/23/2014   Patient is on Vitamin D supplement. Lab Results  Component Value Date   VD25OH 28* 07/25/2014   BMI is Body mass index is 37.68 kg/(m^2)., she is working on diet and exercise.She has OSA, on CPAP.  Wt Readings from Last 3 Encounters:  01/27/15 230 lb (104.327 kg)  12/22/14 227 lb 6 oz (103.137 kg)  12/22/14 228 lb (103.42 kg)     Current Medications:  Current Outpatient Prescriptions on File Prior to Visit  Medication Sig Dispense Refill  . aspirin 81 MG tablet Take 162 mg by mouth at bedtime.     Marland Kitchen aspirin-acetaminophen-caffeine (EXCEDRIN MIGRAINE) 250-250-65 MG per tablet Take 2 tablets by mouth daily as needed for migraine.    . carvedilol (COREG) 25 MG tablet TAKE 1 TABLET (25 MG TOTAL) BY MOUTH 2 (TWO) TIMES DAILY WITH A MEAL 180 tablet 1  . CRESTOR 40 MG tablet TAKE 1 TABLET BY MOUTH EVERY NIGHT AT BEDTIME 90 tablet 1  . diazepam (VALIUM) 5 MG tablet Take 1 tablet (5 mg total) by mouth at bedtime as needed for anxiety. (Patient taking differently: Take 2.5-5 mg by mouth daily  as needed for anxiety. ) 30 tablet 0  . estradiol (ESTRACE) 2 MG tablet Take 2 mg by mouth daily.    . fenofibrate micronized (LOFIBRA) 134 MG capsule Take 1 capsule (134 mg total) by mouth daily before breakfast. 90 capsule 3  . imipramine (TOFRANIL) 25 MG tablet TAKE 1 TO 2 TABLETS BY MOUTH EVERY DAY (Patient taking differently: TAKE 1 TABLETS BY MOUTH EVERY DAY AT BEDTIME) 180 tablet PRN  . insulin aspart protamine- aspart (NOVOLOG MIX 70/30) (70-30) 100 UNIT/ML injection Inject 0.65 mLs (65 Units total) into the skin 2  (two) times daily with a meal. Inject 60 units in a.m. And 60 units in p.m. 10 mL 11  . loratadine (CLARITIN) 10 MG tablet Take 10 mg by mouth daily.     Marland Kitchen losartan (COZAAR) 100 MG tablet Take 1 tablet (100 mg total) by mouth daily. 90 tablet 3  . metFORMIN (GLUCOPHAGE-XR) 500 MG 24 hr tablet Take 1,000 mg by mouth 2 (two) times daily.  11  . nystatin cream (MYCOSTATIN) Apply 1 application topically 3 (three) times daily. Apply to affected area every 4-6 hours x 10 days 30 g 1  . pantoprazole (PROTONIX) 40 MG tablet Take 40 mg by mouth daily as needed (For acid reflux).     . Potassium 99 MG TABS Take 1 tablet by mouth daily.    . predniSONE (DELTASONE) 20 MG tablet Take 3 tablets (60 mg total) by mouth daily with breakfast. 60 mg, 3 tablets for 1 week and 40 mg 2 tablets for 1 week 32 tablet 0  . spironolactone (ALDACTONE) 25 MG tablet TAKE 1 TABLET (25 MG TOTAL) BY MOUTH 2 (TWO) TIMES DAILY. 180 tablet 1  . torsemide (DEMADEX) 20 MG tablet Take 1 tablet (20 mg total) by mouth every other day. (Patient taking differently: Take 20 mg by mouth 3 (three) times a week. ) 45 tablet 0  . triamcinolone cream (KENALOG) 0.1 % APPLY EXTERNALLY TO THE AFFECTED AREA THREE TIMES DAILY 45 g 0  . verapamil (CALAN SR) 180 MG CR tablet Take 1 tablet (180 mg total) by mouth at bedtime. 90 tablet 3   No current facility-administered medications on file prior to visit.   Medical History:  Past Medical History  Diagnosis Date  . Hyperlipidemia   . Hypertension 06/25/2011    Renal dopplers - superior mesenteric artery >50% diameter reduction; R renal artery - normal patency; L proximal renal artery 1-59% reduction (lower end of scale); both kidneys normal in size/symmetry with normal cortex and medulla  . Diabetes mellitus   . Automatic implantable cardiac defibrillator in situ     [employer] MODEL 7122  . Unspecified sleep apnea   . Leg pain 12/09/2010    doppler of R femoral artery - no evidence of dissection, AV  fistula, pseudoaneurysm or other vascular abnormalities  . Sleep apnea     on CPAP - AHI during sleep study was 44  . Obesity   . Type II or unspecified type diabetes mellitus without mention of complication, not stated as uncontrolled   . Type II or unspecified type diabetes mellitus without mention of complication, not stated as uncontrolled   . CHF (congestive heart failure) (HCC)   . Migraines    Allergies:  Allergies  Allergen Reactions  . Flagyl [Metronidazole] Anaphylaxis  . Niacin And Related Other (See Comments) and Cough    flushing  . Penicillins Swelling      . Yeast-Related Products Swelling and  Rash    Review of Systems:  Review of Systems  Constitutional: Positive for malaise/fatigue. Negative for fever, chills, weight loss and diaphoresis.  HENT: Negative.   Respiratory: Positive for shortness of breath. Negative for cough, hemoptysis, sputum production and wheezing.   Cardiovascular: Negative.  PND: sleeps on wedge.  Gastrointestinal: Positive for heartburn. Negative for nausea, vomiting, abdominal pain, diarrhea, constipation, blood in stool and melena.  Genitourinary: Negative.   Musculoskeletal: Negative for myalgias, back pain, joint pain, falls and neck pain.  Skin: Negative.  Negative for itching.  Neurological: Positive for tingling (declines medications). Negative for dizziness, tremors, sensory change, speech change, focal weakness, seizures, loss of consciousness and weakness.  Psychiatric/Behavioral: Negative.     Family history- Review and unchanged Social history- Review and unchanged Physical Exam: BP 110/60 mmHg  Pulse 88  Temp(Src) 97.3 F (36.3 C) (Temporal)  Resp 16  Ht 5' 5.5" (1.664 m)  Wt 230 lb (104.327 kg)  BMI 37.68 kg/m2  SpO2 95% Wt Readings from Last 3 Encounters:  01/27/15 230 lb (104.327 kg)  12/22/14 227 lb 6 oz (103.137 kg)  12/22/14 228 lb (103.42 kg)   General Appearance: Well nourished, in no apparent  distress. Eyes: PERRLA, EOMs, conjunctiva no swelling or erythema Sinuses: No Frontal/maxillary tenderness ENT/Mouth: Ext aud canals clear, TMs without erythema, bulging. No erythema, swelling, or exudate on post pharynx.  Tonsils not swollen or erythematous. Hearing normal.  Neck: Supple, thyroid normal.  Respiratory: Respiratory effort normal, BS equal bilaterally without rales, rhonchi, wheezing or stridor.  Cardio: RRR with no MRGs. Brisk peripheral pulses without edema.  Abdomen: Soft, + BS.  Non tender, no guarding, rebound, hernias, masses. Lymphatics: Non tender without lymphadenopathy.  Musculoskeletal: Full ROM, 5/5 strength, Normal gait Skin: Warm, dry without rashes, lesions, ecchymosis.  Neuro: Cranial nerves intact. No cerebellar symptoms.  Psych: Awake and oriented X 3, normal affect, Insight and Judgment appropriate.    Quentin Mulling, PA-C 8:53 AM [facility]

## 2015-02-11 ENCOUNTER — Telehealth: Payer: Self-pay | Admitting: Cardiovascular Disease

## 2015-02-12 NOTE — Telephone Encounter (Signed)
Close encounter 

## 2015-02-17 ENCOUNTER — Ambulatory Visit (INDEPENDENT_AMBULATORY_CARE_PROVIDER_SITE_OTHER): Payer: BLUE CROSS/BLUE SHIELD | Admitting: Physician Assistant

## 2015-02-17 ENCOUNTER — Encounter: Payer: Self-pay | Admitting: Physician Assistant

## 2015-02-17 VITALS — BP 98/62 | HR 79 | Ht 67.0 in | Wt 234.9 lb

## 2015-02-17 DIAGNOSIS — Z9989 Dependence on other enabling machines and devices: Secondary | ICD-10-CM

## 2015-02-17 DIAGNOSIS — R0789 Other chest pain: Secondary | ICD-10-CM

## 2015-02-17 DIAGNOSIS — E7800 Pure hypercholesterolemia, unspecified: Secondary | ICD-10-CM

## 2015-02-17 DIAGNOSIS — Z794 Long term (current) use of insulin: Secondary | ICD-10-CM

## 2015-02-17 DIAGNOSIS — E669 Obesity, unspecified: Secondary | ICD-10-CM

## 2015-02-17 DIAGNOSIS — R079 Chest pain, unspecified: Secondary | ICD-10-CM

## 2015-02-17 DIAGNOSIS — G4733 Obstructive sleep apnea (adult) (pediatric): Secondary | ICD-10-CM

## 2015-02-17 DIAGNOSIS — I10 Essential (primary) hypertension: Secondary | ICD-10-CM

## 2015-02-17 DIAGNOSIS — R0600 Dyspnea, unspecified: Secondary | ICD-10-CM | POA: Insufficient documentation

## 2015-02-17 DIAGNOSIS — E1165 Type 2 diabetes mellitus with hyperglycemia: Secondary | ICD-10-CM

## 2015-02-17 NOTE — Patient Instructions (Signed)
Your physician has requested that you have an echocardiogram. Echocardiography is a painless test that uses sound waves to create images of your heart. It provides your doctor with information about the size and shape of your heart and how well your heart's chambers and valves are working. This procedure takes approximately one hour. There are no restrictions for this procedure.  Your physician has requested that you have a lexiscan myoview. For further information please visit [email or web address] Please follow instruction sheet, as given.   Your physician recommends that you schedule a follow-up appointment in: 3 months with Dr Tresa Endo.

## 2015-02-17 NOTE — Progress Notes (Signed)
Patient ID: [name], female   DOB: 1956/02/19, 59 y.o.   MRN: [id number]    Date:  02/17/2015   ID:  [name], DOB [birth year], MRN [id number]  PCP:  Kara Corwin, MD  Primary Cardiologist:  [name]   Chief Complaint  Patient presents with  . rov    patient complains of chest tightness and fatique. DOE     History of Present Illness: [name] is a 59 y.o. female  with a history of a nonischemic cardiomyopathy. Initial ejection fraction was in the range of 25-30%. She is status post biventricular ICD implantation by Dr. Ladona Trujillo. She also has a history of obstructive sleep apnea on CPAP therapy admits to being 100% compliant with usage. On her last echo in April 2013 her ejection fraction normalized and was felt to be greater than 55%. She did have diastolic dysfunction. There is evidence for mild aortic valve sclerosis. 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  She underwent a by the pacemaker generator change out by Dr. Ladona Trujillo on 01/16/2014 and with her normalization of LV function, she had a downgrade from an ICD to a BIV permanent pacemaker. She saw Dr. Ladona Trujillo in follow-up in February 2016 at which time she had mild worsening of her heart failure symptoms with a change from class I to class IIb felt predominantly due to dietary and medical noncompliance. She was advised to take torsemide twice a day until her weight came down 8 pounds and then take it daily.  Patient was admitted 12/22/2014 until the 18th after having a severe reaction to poison sumac/poison ivy. She was placed on steroids for about 30 days. She followed up with her endocrinologist and she was placed on Invokamet. Because of this her torsemide was discontinued. She does still take Aldactone.  Patient is here for follow-up. She reports that over the last few months  she's become extremely winded on short distances. She also reports some chest tightness with exertion which improves with rest. She is really pushing hard she'll get nauseated. She's had some lower extremity edema and weight gain recently which she attributes to the styloid. As of today however, the edema has improved. Her weight is up about 7 pounds compared to October 16. She otherwise denies  vomiting, fever, dizziness, PND, cough, congestion, abdominal pain, hematochezia, melena,  claudication.  Wt Readings from Last 3 Encounters:  02/17/15 234 lb 14.4 oz (106.55 kg)  01/27/15 230 lb (104.327 kg)  12/22/14 227 lb 6 oz (103.137 kg)     Past Medical History  Diagnosis Date  . Hyperlipidemia   . Hypertension 06/25/2011    Renal dopplers - superior mesenteric artery >50% diameter reduction; R renal artery - normal patency; L proximal renal artery 1-59% reduction (lower end of scale); both kidneys normal in size/symmetry with normal cortex and medulla  . Diabetes mellitus   . Automatic implantable cardiac defibrillator in situ     [employer] JUDE MODEL 7122  . Unspecified sleep apnea   . Leg pain 12/09/2010    doppler of R femoral artery - no evidence of dissection, AV fistula, pseudoaneurysm or other vascular abnormalities  . Sleep apnea     on CPAP - AHI during sleep study was 44  . Obesity   . Type II or unspecified type diabetes mellitus without mention of complication, not stated as uncontrolled   . Type II or  unspecified type diabetes mellitus without mention of complication, not stated as uncontrolled   . CHF (congestive heart failure) (HCC)   . Migraines     Current Outpatient Prescriptions  Medication Sig Dispense Refill  . aspirin 81 MG tablet Take 162 mg by mouth at bedtime.     Marland Kitchen aspirin-acetaminophen-caffeine (EXCEDRIN MIGRAINE) 250-250-65 MG per tablet Take 2 tablets by mouth daily as needed for migraine.    . carvedilol (COREG) 25 MG tablet TAKE 1 TABLET (25 MG TOTAL) BY MOUTH 2  (TWO) TIMES DAILY WITH A MEAL 180 tablet 1  . CRESTOR 40 MG tablet TAKE 1 TABLET BY MOUTH EVERY NIGHT AT BEDTIME 90 tablet 1  . diazepam (VALIUM) 5 MG tablet Take 1 tablet (5 mg total) by mouth at bedtime as needed for anxiety. (Patient taking differently: Take 2.5-5 mg by mouth daily as needed for anxiety. ) 30 tablet 0  . estradiol (ESTRACE) 2 MG tablet Take 2 mg by mouth daily.    . fenofibrate micronized (LOFIBRA) 134 MG capsule Take 1 capsule (134 mg total) by mouth daily before breakfast. 90 capsule 3  . imipramine (TOFRANIL) 25 MG tablet TAKE 1 TO 2 TABLETS BY MOUTH EVERY DAY (Patient taking differently: TAKE 1 TABLETS BY MOUTH EVERY DAY AT BEDTIME) 180 tablet PRN  . insulin aspart protamine- aspart (NOVOLOG MIX 70/30) (70-30) 100 UNIT/ML injection Inject 0.65 mLs (65 Units total) into the skin 2 (two) times daily with a meal. Inject 60 units in a.m. And 60 units in p.m. 10 mL 11  . loratadine (CLARITIN) 10 MG tablet Take 10 mg by mouth daily.     Marland Kitchen losartan (COZAAR) 100 MG tablet Take 1 tablet (100 mg total) by mouth daily. 90 tablet 3  . metFORMIN (GLUCOPHAGE-XR) 500 MG 24 hr tablet Take 1,000 mg by mouth 2 (two) times daily.  11  . NOVOLIN 70/30 RELION (70-30) 100 UNIT/ML injection Inject 75 Units into the skin 2 (two) times daily.  0  . pantoprazole (PROTONIX) 40 MG tablet Take 40 mg by mouth daily as needed (For acid reflux).     . Potassium 99 MG TABS Take 1 tablet by mouth daily.    Marland Kitchen RELION INSULIN SYRINGE 1ML/31G 31G X 5/16" 1 ML MISC   0  . spironolactone (ALDACTONE) 25 MG tablet TAKE 1 TABLET (25 MG TOTAL) BY MOUTH 2 (TWO) TIMES DAILY. 180 tablet 1  . verapamil (CALAN SR) 180 MG CR tablet Take 1 tablet (180 mg total) by mouth at bedtime. 90 tablet 3   No current facility-administered medications for this visit.    Allergies:    Allergies  Allergen Reactions  . Flagyl [Metronidazole] Anaphylaxis  . Niacin And Related Other (See Comments) and Cough    flushing  . Penicillins  Swelling    Has patient had a PCN reaction causing immediate rash, facial/tongue/throat swelling, SOB or lightheadedness with hypotension:  Has patient had a PCN reaction causing severe rash involving mucus membranes or skin necrosis:  Has patient had a PCN reaction that required hospitalization  Has patient had a PCN reaction occurring within the last 10 years: If all of the above answers are "NO", then may proceed with Cephalosporin use.  . Yeast-Related Products Swelling and Rash    Social History:  The patient  reports that she quit smoking about 39 years ago. Her smoking use included Cigarettes. She does not have any smokeless tobacco history on file. She reports that she drinks alcohol. She reports that  she does not use illicit drugs.   Family history:   Family History  Problem Relation Age of Onset  . Heart attack Father   . Hearing loss Father   . Hypertension Father   . Cancer Mother     ROS:  Please see the history of present illness.  All other systems reviewed and negative.   PHYSICAL EXAM: VS:  BP 98/62 mmHg  Pulse 79  Ht  (1.702 m)  Wt 234 lb 14.4 oz (106.55 kg)  BMI 36.78 kg/m2 Obese, well developed, in no acute distress HEENT: Pupils are equal round react to light accommodation extraocular movements are intact.  Neck: no JVDNo cervical lymphadenopathy. Cardiac: Regular rate and rhythm without murmurs rubs or gallops. Lungs:  clear to auscultation bilaterally, no wheezing, rhonchi or rales Abd: soft, nontender, positive bowel sounds all quadrants, no hepatosplenomegaly Ext: no lower extremity edema.  2+ radial and dorsalis pedis pulses. Skin: warm and dry Neuro:  Grossly normal  EKG:  Paced rhythm rate 79 bpm   ASSESSMENT AND PLAN:  Problem List Items Addressed This Visit    Uncontrolled type 2 diabetes mellitus with hyperglycemia, with long-term current use of insulin (HCC)   Relevant Medications   NOVOLIN 70/30 RELION (70-30) 100 UNIT/ML injection    OSA on CPAP   Obesity   Relevant Medications   NOVOLIN 70/30 RELION (70-30) 100 UNIT/ML injection   HYPERCHOLESTEROLEMIA   Essential hypertension - Primary   Relevant Orders   EKG 12-Lead   Dyspnea   Relevant Orders   ECHOCARDIOGRAM COMPLETE   Chest pain    Other Visit Diagnoses    Chest tightness        Relevant Orders    EKG 12-Lead    Myocardial Perfusion Imaging       Patient has been noticing progressively worsening dyspnea and chest tightness with less and less exertion. She was recently treated with about 30 days worth of stairs because of severe poison sumac/IV reaction. She attributes her fluid gain to that. As of today edema in her lower extremities has improved. She is now on a SG L T2 inhibitor for diabetes as well as act Aldactone which is likely helping her edema.  She will continue to monitor her weight. We'll check a 2-D echocardiogram for the worsening dyspnea we'll also check a Lexiscan Myoview for the chest tightness. Her EKG is paced.  Blood pressures on the low side. Her husband is an EMT and will check it a few times this week. We may need to consider cutting back on the losartan.

## 2015-02-27 ENCOUNTER — Telehealth (HOSPITAL_COMMUNITY): Payer: Self-pay

## 2015-02-27 NOTE — Telephone Encounter (Signed)
Encounter complete. 

## 2015-03-04 ENCOUNTER — Other Ambulatory Visit: Payer: Self-pay

## 2015-03-04 ENCOUNTER — Ambulatory Visit (HOSPITAL_COMMUNITY): Payer: BLUE CROSS/BLUE SHIELD | Attending: Cardiovascular Disease

## 2015-03-04 DIAGNOSIS — I313 Pericardial effusion (noninflammatory): Secondary | ICD-10-CM | POA: Insufficient documentation

## 2015-03-04 DIAGNOSIS — R0600 Dyspnea, unspecified: Secondary | ICD-10-CM

## 2015-03-04 DIAGNOSIS — E785 Hyperlipidemia, unspecified: Secondary | ICD-10-CM | POA: Insufficient documentation

## 2015-03-04 DIAGNOSIS — G4733 Obstructive sleep apnea (adult) (pediatric): Secondary | ICD-10-CM | POA: Diagnosis not present

## 2015-03-04 DIAGNOSIS — Z87891 Personal history of nicotine dependence: Secondary | ICD-10-CM | POA: Diagnosis not present

## 2015-03-04 DIAGNOSIS — I517 Cardiomegaly: Secondary | ICD-10-CM | POA: Insufficient documentation

## 2015-03-04 DIAGNOSIS — I10 Essential (primary) hypertension: Secondary | ICD-10-CM | POA: Diagnosis not present

## 2015-03-04 DIAGNOSIS — E119 Type 2 diabetes mellitus without complications: Secondary | ICD-10-CM | POA: Diagnosis not present

## 2015-03-04 DIAGNOSIS — K219 Gastro-esophageal reflux disease without esophagitis: Secondary | ICD-10-CM | POA: Diagnosis not present

## 2015-03-05 ENCOUNTER — Ambulatory Visit (HOSPITAL_COMMUNITY)
Admission: RE | Admit: 2015-03-05 | Discharge: 2015-03-05 | Disposition: A | Discharge status: Home / Self Care | Payer: BLUE CROSS/BLUE SHIELD | Source: Ambulatory Visit | Attending: Cardiology | Admitting: Cardiology

## 2015-03-05 DIAGNOSIS — Z8249 Family history of ischemic heart disease and other diseases of the circulatory system: Secondary | ICD-10-CM | POA: Diagnosis not present

## 2015-03-05 DIAGNOSIS — R0789 Other chest pain: Secondary | ICD-10-CM | POA: Diagnosis not present

## 2015-03-05 DIAGNOSIS — Z6836 Body mass index (BMI) 36.0-36.9, adult: Secondary | ICD-10-CM | POA: Diagnosis not present

## 2015-03-05 DIAGNOSIS — G4733 Obstructive sleep apnea (adult) (pediatric): Secondary | ICD-10-CM | POA: Insufficient documentation

## 2015-03-05 DIAGNOSIS — E669 Obesity, unspecified: Secondary | ICD-10-CM | POA: Insufficient documentation

## 2015-03-05 DIAGNOSIS — R42 Dizziness and giddiness: Secondary | ICD-10-CM | POA: Insufficient documentation

## 2015-03-05 DIAGNOSIS — I10 Essential (primary) hypertension: Secondary | ICD-10-CM | POA: Insufficient documentation

## 2015-03-05 DIAGNOSIS — E119 Type 2 diabetes mellitus without complications: Secondary | ICD-10-CM | POA: Diagnosis not present

## 2015-03-05 DIAGNOSIS — R0609 Other forms of dyspnea: Secondary | ICD-10-CM | POA: Diagnosis not present

## 2015-03-05 DIAGNOSIS — Z87891 Personal history of nicotine dependence: Secondary | ICD-10-CM | POA: Insufficient documentation

## 2015-03-05 DIAGNOSIS — I447 Left bundle-branch block, unspecified: Secondary | ICD-10-CM | POA: Insufficient documentation

## 2015-03-05 DIAGNOSIS — R0602 Shortness of breath: Secondary | ICD-10-CM | POA: Diagnosis not present

## 2015-03-05 DIAGNOSIS — R5383 Other fatigue: Secondary | ICD-10-CM | POA: Diagnosis not present

## 2015-03-05 MED ORDER — TECHNETIUM TC 99M SESTAMIBI GENERIC - CARDIOLITE
32.3000 | Freq: Once | INTRAVENOUS | Status: AC | PRN
  Administered 2015-03-05: 32.3 via INTRAVENOUS

## 2015-03-05 MED ORDER — REGADENOSON 0.4 MG/5ML IV SOLN
0.4000 mg | Freq: Once | INTRAVENOUS | Status: AC
Start: 2015-03-05 — End: 2015-03-05
  Administered 2015-03-05: 0.4 mg via INTRAVENOUS

## 2015-03-06 ENCOUNTER — Ambulatory Visit (HOSPITAL_COMMUNITY)
Admission: RE | Admit: 2015-03-06 | Discharge: 2015-03-06 | Disposition: A | Discharge status: Home / Self Care | Payer: BLUE CROSS/BLUE SHIELD | Source: Ambulatory Visit | Attending: Urology | Admitting: Urology

## 2015-03-06 LAB — MYOCARDIAL PERFUSION IMAGING
CHL CUP NUCLEAR SDS: 3
CHL CUP NUCLEAR SSS: 5
CSEPPHR: 100 {beats}/min
LV dias vol: 109 mL
LV sys vol: 37 mL
Rest HR: 77 {beats}/min
SRS: 2
TID: 0.73

## 2015-03-06 MED ORDER — TECHNETIUM TC 99M SESTAMIBI GENERIC - CARDIOLITE
32.1000 | Freq: Once | INTRAVENOUS | Status: AC | PRN
  Administered 2015-03-06: 32.1 via INTRAVENOUS

## 2015-03-11 ENCOUNTER — Telehealth: Payer: Self-pay | Admitting: *Deleted

## 2015-03-11 NOTE — Telephone Encounter (Signed)
Per Wilburt Finlay, PA-C, I called the pt re: stress test and ECHO and she has been made aware that they both look good.  She was advised about the effusion being mainly from the extra fluid that she had.  Pt verbalized understanding.

## 2015-03-14 ENCOUNTER — Other Ambulatory Visit: Payer: Self-pay | Admitting: Cardiovascular Disease

## 2015-03-14 MED ORDER — ROSUVASTATIN CALCIUM 40 MG PO TABS: 40.0000 mg | ORAL_TABLET | Freq: Every day | ORAL | Status: DC

## 2015-03-14 NOTE — Telephone Encounter (Signed)
Rx(s) sent to pharmacy electronically.  

## 2015-03-14 NOTE — Telephone Encounter (Signed)
[facility] pharmacy requesting a refill on Rosuvastatin 40 mg.

## 2015-03-21 ENCOUNTER — Encounter: Payer: Self-pay | Admitting: Internal Medicine

## 2015-03-21 ENCOUNTER — Ambulatory Visit (INDEPENDENT_AMBULATORY_CARE_PROVIDER_SITE_OTHER): Payer: BLUE CROSS/BLUE SHIELD | Admitting: Internal Medicine

## 2015-03-21 VITALS — BP 114/72 | HR 82 | Ht 67.0 in | Wt 231.8 lb

## 2015-03-21 DIAGNOSIS — I10 Essential (primary) hypertension: Secondary | ICD-10-CM | POA: Diagnosis not present

## 2015-03-21 DIAGNOSIS — Z950 Presence of cardiac pacemaker: Secondary | ICD-10-CM

## 2015-03-21 DIAGNOSIS — I5022 Chronic systolic (congestive) heart failure: Secondary | ICD-10-CM | POA: Diagnosis not present

## 2015-03-21 LAB — CUP PACEART INCLINIC DEVICE CHECK
Battery Remaining Longevity: 97.2
Brady Statistic RV Percent Paced: 99.92 %
Date Time Interrogation Session: 20170113090709
Implantable Lead Implant Date: 20091016
Implantable Lead Implant Date: 20091016
Implantable Lead Location: 753858
Implantable Lead Model: 4196
Lead Channel Impedance Value: 412.5 Ohm
Lead Channel Pacing Threshold Amplitude: 0.5 V
Lead Channel Pacing Threshold Amplitude: 0.75 V
Lead Channel Pacing Threshold Amplitude: 1 V
Lead Channel Pacing Threshold Pulse Width: 0.3 ms
Lead Channel Pacing Threshold Pulse Width: 0.6 ms
Lead Channel Sensing Intrinsic Amplitude: 3.9 mV
Lead Channel Setting Pacing Amplitude: 2 V
Lead Channel Setting Pacing Pulse Width: 0.3 ms
MDC IDC LEAD IMPLANT DT: 20091016
MDC IDC LEAD LOCATION: 753859
MDC IDC LEAD LOCATION: 753860
MDC IDC LEAD MODEL: 7122
MDC IDC MSMT BATTERY VOLTAGE: 2.99 V
MDC IDC MSMT LEADCHNL LV IMPEDANCE VALUE: 762.5 Ohm
MDC IDC MSMT LEADCHNL RA PACING THRESHOLD PULSEWIDTH: 0.3 ms
MDC IDC MSMT LEADCHNL RV IMPEDANCE VALUE: 475 Ohm
MDC IDC MSMT LEADCHNL RV SENSING INTR AMPL: 11.9 mV
MDC IDC PG SERIAL: 7688750
MDC IDC SET LEADCHNL LV PACING PULSEWIDTH: 0.6 ms
MDC IDC SET LEADCHNL RA PACING AMPLITUDE: 2 V
MDC IDC SET LEADCHNL RV PACING AMPLITUDE: 2.5 V
MDC IDC SET LEADCHNL RV SENSING SENSITIVITY: 2 mV
MDC IDC STAT BRADY RA PERCENT PACED: 2.2 %

## 2015-03-21 NOTE — Assessment & Plan Note (Signed)
Her blood pressure is well controlled. She will continue her current meds.  She is encouraged to lose weight.  

## 2015-03-21 NOTE — Assessment & Plan Note (Signed)
Her [employer] Biv PM is working normally. Will recheck in several months. 

## 2015-03-21 NOTE — Progress Notes (Signed)
HPI [name] returns today for followup. She is a very pleasant 60 year old woman with a nonischemic cardiomyopathy, chronic systolic heart failure, and left bundle branch block, who underwent biventricular ICD implantation and had normalization of her left ventricular function, s/p downgrade to a BiV PPM. In the interim she has been sick in the hospital. She got poison sumac and superinfection and required several days in the hospital. She has PND and orthopnea. Her heart failure symptoms have worsened. No syncope or palpitations.   Current Outpatient Prescriptions  Medication Sig Dispense Refill  . aspirin 81 MG tablet Take 162 mg by mouth at bedtime.     Marland Kitchen aspirin-acetaminophen-caffeine (EXCEDRIN MIGRAINE) 250-250-65 MG per tablet Take 2 tablets by mouth daily as needed for migraine.    . carvedilol (COREG) 25 MG tablet TAKE 1 TABLET (25 MG TOTAL) BY MOUTH 2 (TWO) TIMES DAILY WITH A MEAL 180 tablet 1  . diazepam (VALIUM) 5 MG tablet Take 1 tablet (5 mg total) by mouth at bedtime as needed for anxiety. (Patient taking differently: Take 2.5-5 mg by mouth daily as needed for anxiety. ) 30 tablet 0  . estradiol (ESTRACE) 2 MG tablet Take 2 mg by mouth daily.    . fenofibrate micronized (LOFIBRA) 134 MG capsule Take 1 capsule (134 mg total) by mouth daily before breakfast. 90 capsule 3  . imipramine (TOFRANIL) 25 MG tablet TAKE 1 TO 2 TABLETS BY MOUTH EVERY DAY 180 tablet PRN  . INVOKAMET XR 50-1000 MG TB24 Take 1 tablet by mouth 2 (two) times daily.  1  . loratadine (CLARITIN) 10 MG tablet Take 10 mg by mouth daily.     Marland Kitchen losartan (COZAAR) 100 MG tablet Take 50 mg by mouth daily.    Marland Kitchen NOVOLIN 70/30 RELION (70-30) 100 UNIT/ML injection Inject 75 Units into the skin 2 (two) times daily.  0  . pantoprazole (PROTONIX) 40 MG tablet Take 40 mg by mouth daily as needed (For acid reflux).     . Potassium 99 MG TABS Take 1 tablet by mouth daily.    . rosuvastatin (CRESTOR) 40 MG tablet Take 1 tablet (40  mg total) by mouth daily. 90 tablet 3  . spironolactone (ALDACTONE) 25 MG tablet TAKE 1 TABLET (25 MG TOTAL) BY MOUTH 2 (TWO) TIMES DAILY. 180 tablet 1  . verapamil (CALAN SR) 180 MG CR tablet Take 1 tablet (180 mg total) by mouth at bedtime. 90 tablet 3   No current facility-administered medications for this visit.     Past Medical History  Diagnosis Date  . Hyperlipidemia   . Hypertension 06/25/2011    Renal dopplers - superior mesenteric artery >50% diameter reduction; R renal artery - normal patency; L proximal renal artery 1-59% reduction (lower end of scale); both kidneys normal in size/symmetry with normal cortex and medulla  . Diabetes mellitus   . Automatic implantable cardiac defibrillator in situ     ST[employer] JUDE MODEL 7122  . Unspecified sleep apnea   . Leg pain 12/09/2010    doppler of R femoral artery - no evidence of dissection, AV fistula, pseudoaneurysm or other vascular abnormalities  . Sleep apnea     on CPAP - AHI during sleep study was 44  . Obesity   . Type II or unspecified type diabetes mellitus without mention of complication, not stated as uncontrolled   . Type II or unspecified type diabetes mellitus without mention of complication, not stated as uncontrolled   . CHF (congestive heart failure) (HCC)   .  Migraines     ROS:   All systems reviewed and negative except as noted in the HPI.   Past Surgical History  Procedure Laterality Date  . Abdominal hysterectomy    . Neck surgery    . Pacemaker insertion    . Cardiac defibrillator placement      ICD by Dr. Ladona Ridgel  . Knee surgery    . Doppler echocardiography  06/25/2011    EF >55%; moderate concentric LV hypertrophy; LA mildly dilated, mild mitral annular calcification;   . Cardiovascular stress test  08/28/2010    R/P MV - pattern of normal perfusion in all regions, no scintigraphic evidence of inducible myocardial ischemia; no EKG changes; normal perfusion study; pt did experience chesst pain during  strudy, resolved spontaneously  . Cardiac catheterization  07/15/2006    no significant CAD by cardiac cath, confirmed by intravascular [place] of LAD; non-ischemic cardiomyopathy prob related to uncontrolled hypertension, DM and morbid obesity; moderate pulmonary hypertension; preserved cardiac output and cardiac index  . Biv pacemaker generator change out N/A 01/16/2014    Procedure: BIV PACEMAKER GENERATOR CHANGE OUT;  Surgeon: Marinus Maw, MD;  Location: [facility] CATH LAB;  Service: Cardiovascular;  Laterality: N/A;     Family History  Problem Relation Age of Onset  . Heart attack Father   . Hearing loss Father   . Hypertension Father   . Cancer Mother      Social History   Social History  . Marital Status: Married    Spouse Name: N/A  . Number of Children: N/A  . Years of Education: N/A   Occupational History  . Not on file.   Social History Main Topics  . Smoking status: Former Smoker    Types: Cigarettes    Quit date: 03/09/1975  . Smokeless tobacco: Not on file  . Alcohol Use: 0.0 oz/week    0 Standard drinks or equivalent per week  . Drug Use: No  . Sexual Activity: Not on file   Other Topics Concern  . 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     BP 114/72 mmHg  Pulse 82  Ht  (1.702 m)  Wt 231 lb 12.8 oz (105.144 kg)  BMI 36.30 kg/m2  Physical Exam:  Well appearing obese, middle-age woman, NAD HEENT: Unremarkable Neck:  7 cm JVD, no thyromegally Back:  No CVA tenderness Lungs:  Scattered basilar rales, well-healed PPM incision HEART:  Regular rate rhythm, grade 1/6 systolic murmur, no rubs, no clicks Abd:  soft, obese, positive bowel sounds, no organomegally, no rebound, no guarding Ext:  2 plus pulses, no edema, no cyanosis, no clubbing Skin:  No rashes no nodules Neuro:  CN II through XII intact, motor grossly intact  DEVICE  Normal device function.  See PaceArt for details.   Assess/Plan:

## 2015-03-21 NOTE — Assessment & Plan Note (Signed)
Her symptoms are class 2. She will continue her current meds. Her LV function remains normalized after BiV pacing

## 2015-03-21 NOTE — Patient Instructions (Signed)
Medication Instructions:  Your physician recommends that you continue on your current medications as directed. Please refer to the Current Medication list given to you today.   Labwork: none  Testing/Procedures: none  Follow-Up: Remote monitoring is used to monitor your Pacemaker or ICD from home. This monitoring reduces the number of office visits required to check your device to one time per year. It allows [place] to keep an eye on the functioning of your device to ensure it is working properly. You are scheduled for a device check from home on 10/23/2015. You may send your transmission at any time that day. If you have a wireless device, the transmission will be sent automatically. After your physician reviews your transmission, you will receive a postcard with your next transmission date.  Your physician wants you to follow-up in: 12 months with Dr. Ladona Ridgel. You will receive a reminder letter in the mail two months in advance. If you don't receive a letter, please call our office to schedule the follow-up appointment.   Any Other Special Instructions Will Be Listed Below (If Applicable).   Contact your Primary Care Provider to discuss Chicken Pox Titers (Varicella).  If you need a refill on your cardiac medications before your next appointment, please call your pharmacy.

## 2015-04-23 ENCOUNTER — Ambulatory Visit (INDEPENDENT_AMBULATORY_CARE_PROVIDER_SITE_OTHER): Payer: BLUE CROSS/BLUE SHIELD | Admitting: Physician Assistant

## 2015-04-23 ENCOUNTER — Encounter: Payer: Self-pay | Admitting: Physician Assistant

## 2015-04-23 VITALS — BP 100/70 | HR 88 | Temp 97.7°F | Resp 16 | Ht 67.0 in | Wt 233.0 lb

## 2015-04-23 DIAGNOSIS — Z1159 Encounter for screening for other viral diseases: Secondary | ICD-10-CM

## 2015-04-23 DIAGNOSIS — J020 Streptococcal pharyngitis: Secondary | ICD-10-CM | POA: Diagnosis not present

## 2015-04-23 LAB — POCT RAPID STREP A (OFFICE): RAPID STREP A SCREEN: NEGATIVE

## 2015-04-23 NOTE — Progress Notes (Signed)
Subjective:    Patient ID: [name], female    DOB: 1955-04-17, 60 y.o.   MRN: [id number]  HPI 60 y.o. obese WF with history of CHF presents with sore throat, has been around 3 people with strep, started to have sore throat, cough, low grade temp, not feeling well/fatigue. Has taken nyquil with minimal relief. Denies weight gain, edema, PND.   Blood pressure 100/70, pulse 88, temperature 97.7 F (36.5 C), temperature source Temporal, resp. rate 16, height 5\' 7"  (1.702 m), weight 233 lb (105.688 kg), SpO2 96 %.  Wt Readings from Last 6 Encounters:  04/23/15 233 lb (105.688 kg)  03/21/15 231 lb 12.8 oz (105.144 kg)  03/05/15 234 lb (106.142 kg)  02/17/15 234 lb 14.4 oz (106.55 kg)  01/27/15 230 lb (104.327 kg)  12/22/14 227 lb 6 oz (103.137 kg)    Current Outpatient Prescriptions on File Prior to Visit  Medication Sig Dispense Refill  . aspirin 81 MG tablet Take 162 mg by mouth at bedtime.     Marland Kitchen aspirin-acetaminophen-caffeine (EXCEDRIN MIGRAINE) 250-250-65 MG per tablet Take 2 tablets by mouth daily as needed for migraine.    . carvedilol (COREG) 25 MG tablet TAKE 1 TABLET (25 MG TOTAL) BY MOUTH 2 (TWO) TIMES DAILY WITH A MEAL 180 tablet 1  . diazepam (VALIUM) 5 MG tablet Take 1 tablet (5 mg total) by mouth at bedtime as needed for anxiety. (Patient taking differently: Take 2.5-5 mg by mouth daily as needed for anxiety. ) 30 tablet 0  . estradiol (ESTRACE) 2 MG tablet Take 2 mg by mouth daily.    . fenofibrate micronized (LOFIBRA) 134 MG capsule Take 1 capsule (134 mg total) by mouth daily before breakfast. 90 capsule 3  . imipramine (TOFRANIL) 25 MG tablet TAKE 1 TO 2 TABLETS BY MOUTH EVERY DAY 180 tablet PRN  . INVOKAMET XR 50-1000 MG TB24 Take 1 tablet by mouth 2 (two) times daily.  1  . loratadine (CLARITIN) 10 MG tablet Take 10 mg by mouth daily.     Marland Kitchen losartan (COZAAR) 100 MG tablet Take 50 mg by mouth daily.    Marland Kitchen NOVOLIN 70/30 RELION (70-30) 100 UNIT/ML injection Inject  75 Units into the skin 2 (two) times daily.  0  . pantoprazole (PROTONIX) 40 MG tablet Take 40 mg by mouth daily as needed (For acid reflux).     . Potassium 99 MG TABS Take 1 tablet by mouth daily.    . rosuvastatin (CRESTOR) 40 MG tablet Take 1 tablet (40 mg total) by mouth daily. 90 tablet 3  . spironolactone (ALDACTONE) 25 MG tablet TAKE 1 TABLET (25 MG TOTAL) BY MOUTH 2 (TWO) TIMES DAILY. 180 tablet 1  . verapamil (CALAN SR) 180 MG CR tablet Take 1 tablet (180 mg total) by mouth at bedtime. 90 tablet 3   No current facility-administered medications on file prior to visit.   Past Medical History  Diagnosis Date  . Hyperlipidemia   . Hypertension 06/25/2011    Renal dopplers - superior mesenteric artery >50% diameter reduction; R renal artery - normal patency; L proximal renal artery 1-59% reduction (lower end of scale); both kidneys normal in size/symmetry with normal cortex and medulla  . Diabetes mellitus   . Automatic implantable cardiac defibrillator in situ     ST[employer] JUDE MODEL 7122  . Unspecified sleep apnea   . Leg pain 12/09/2010    doppler of R femoral artery - no evidence of dissection, AV fistula, pseudoaneurysm  or other vascular abnormalities  . Sleep apnea     on CPAP - AHI during sleep study was 44  . Obesity   . Type II or unspecified type diabetes mellitus without mention of complication, not stated as uncontrolled   . Type II or unspecified type diabetes mellitus without mention of complication, not stated as uncontrolled   . CHF (congestive heart failure) (HCC)   . Migraines     Review of Systems  Constitutional: Positive for fever and chills. Negative for diaphoresis, activity change, appetite change, fatigue and unexpected weight change.  HENT: Positive for congestion, postnasal drip, rhinorrhea, sinus pressure and sore throat. Negative for dental problem, drooling, ear discharge, ear pain, facial swelling, hearing loss, mouth sores, nosebleeds, sneezing, tinnitus,  trouble swallowing and voice change.   Eyes: Negative.   Respiratory: Positive for cough. Negative for apnea, choking, chest tightness, shortness of breath, wheezing and stridor.   Cardiovascular: Negative.   Gastrointestinal: Negative.   Genitourinary: Negative.   Musculoskeletal: Negative.   Neurological: Negative.        Objective:   Physical Exam  Constitutional: She appears well-developed and well-nourished.  HENT:  Head: Normocephalic and atraumatic.  Right Ear: External ear normal.  Nose: Right sinus exhibits maxillary sinus tenderness. Right sinus exhibits no frontal sinus tenderness. Left sinus exhibits maxillary sinus tenderness. Left sinus exhibits no frontal sinus tenderness.  Mouth/Throat: Uvula is midline. No trismus in the jaw. Posterior oropharyngeal edema and posterior oropharyngeal erythema present. No oropharyngeal exudate or tonsillar abscesses.  Eyes: Conjunctivae and EOM are normal.  Neck: Normal range of motion. Neck supple.  Cardiovascular: Normal rate, regular rhythm, normal heart sounds and intact distal pulses.   Pulmonary/Chest: Effort normal and breath sounds normal. No respiratory distress. She has no wheezes.  Abdominal: Soft. Bowel sounds are normal.  Lymphadenopathy:    She has no cervical adenopathy.  Skin: Skin is warm and dry.       Assessment & Plan:  URI Flonase, claritin, if not better in 7 days then will give ABX Negative strep in office, likely viral

## 2015-04-23 NOTE — Patient Instructions (Signed)
Sinusitis can be uncomfortable. People with sinusitis have congestion with yellow/green/gray discharge, sinus pain/pressure, pain around the eyes. Sinus infections almost ALWAYS stem from a viral infection and antibiotics don't work against a virus. Even when bacteria is responsible, the infections usually clear up on their own in a week or so.   PLEASE TRY TO DO OVER THE COUNTER TREATMENT AND PREDNISONE FOR 5-7 DAYS AND IF YOU ARE NOT GETTING BETTER OR GETTING WORSE THEN YOU CAN START ON AN ANTIBIOTIC GIVEN.  Can take the prednisone AT NIGHT WITH DINNER, it take 8-12 hours to start working so it will NOT affect your sleeping if you take it at night with your food!! Take two pills the first night and 1 or two pill the second night and then 1 pill the other nights.   Risk of antibiotic use: About 1 in 4 people who take antibiotics have side effects including stomach problems, dizziness, or rashes. Those problems clear up soon after stopping the drugs, but in rare cases antibiotics can cause severe allergic reaction. Over use of antibiotics also encourages the growth of bacteria that can't be controlled easily with drugs. That makes you more vunerable to antibiotic-resistant infections and undermines the benefits of antibiotics for others.   Waste of Money: Antibiotics often aren't very expensive, but any money spent on unnecessary drugs is money down the drain.   When are antibiotics needed? Only when symptoms last longer than a week.  Start to improve but then worsen again  -It can take up to 2 weeks to feel better.   -If you do not get better in 7-10 days (Have fever, facial pain, dental pain and swelling), then please call the office and it is now appropriate to start an antibiotic.   -Please take Tylenol or Ibuprofen for pain. -Acetaminiphen 325mg orally every 4-6 hours for pain.  Max: 10 per day -Ibuprofen 200mg orally every 6-8 hours for pain.  Take with food to avoid ulcers.   Max 10 per  day  Please pick one of the over the counter allergy medications below and take it once daily for allergies.  Claritin or loratadine cheapest but likely the weakest  Zyrtec or certizine at night because it can make you sleepy The strongest is allegra or fexafinadine  Cheapest at [employer], [employer], [employer]  -While drinking fluids, pinch and hold nose close and swallow.  This will help open up your eustachian tubes to drain the fluid behind your ear drums. -Try steam showers to open your nasal passages.   Drink lots of water to stay hydrated and to thin mucous.  Flonase/Nasonex is to help the inflammation.  Take 2 sprays in each nostril at bedtime.  Make sure you spray towards the outside of each nostril towards the outer corner of your eye, hold nose close and tilt head back.  This will help the medication get into your sinuses.  If you do not like this medication, then use saline nasal sprays same directions as above for Flonase. Stop the medication right away if you get blurring of your vision or nose bleeds.  Sinusitis Sinusitis is redness, soreness, and inflammation of the paranasal sinuses. Paranasal sinuses are air pockets within the bones of your face (beneath the eyes, the middle of the forehead, or above the eyes). In healthy paranasal sinuses, mucus is able to drain out, and air is able to circulate through them by way of your nose. However, when your paranasal sinuses are inflamed, mucus and air can   become trapped. This can allow bacteria and other germs to grow and cause infection. Sinusitis can develop quickly and last only a short time (acute) or continue over a long period (chronic). Sinusitis that lasts for more than 12 weeks is considered chronic.  CAUSES  Causes of sinusitis include: Allergies. Structural abnormalities, such as displacement of the cartilage that separates your nostrils (deviated septum), which can decrease the air flow through your nose and sinuses and affect sinus  drainage. Functional abnormalities, such as when the small hairs (cilia) that line your sinuses and help remove mucus do not work properly or are not present. SIGNS AND SYMPTOMS  Symptoms of acute and chronic sinusitis are the same. The primary symptoms are pain and pressure around the affected sinuses. Other symptoms include: Upper toothache. Earache. Headache. Bad breath. Decreased sense of smell and taste. A cough, which worsens when you are lying flat. Fatigue. Fever. Thick drainage from your nose, which often is green and may contain pus (purulent). Swelling and warmth over the affected sinuses. DIAGNOSIS  Your health care provider will perform a physical exam. During the exam, your health care provider may: Look in your nose for signs of abnormal growths in your nostrils (nasal polyps).  Tap over the affected sinus to check for signs of infection. View the inside of your sinuses (endoscopy) using an imaging device that has a light attached (endoscope). If your health care provider suspects that you have chronic sinusitis, one or more of the following tests may be recommended: Allergy tests. Nasal culture. A sample of mucus is taken from your nose, sent to a lab, and screened for bacteria. Nasal cytology. A sample of mucus is taken from your nose and examined by your health care provider to determine if your sinusitis is related to an allergy. TREATMENT  Most cases of acute sinusitis are related to a viral infection and will resolve on their own within 10 days. Sometimes medicines are prescribed to help relieve symptoms (pain medicine, decongestants, nasal steroid sprays, or saline sprays).  However, for sinusitis related to a bacterial infection, your health care provider will prescribe antibiotic medicines. These are medicines that will help kill the bacteria causing the infection.  Rarely, sinusitis is caused by a fungal infection. In theses cases, your health care provider will  prescribe antifungal medicine. For some cases of chronic sinusitis, surgery is needed. Generally, these are cases in which sinusitis recurs more than 3 times per year, despite other treatments. HOME CARE INSTRUCTIONS  Drink plenty of water. Water helps thin the mucus so your sinuses can drain more easily. Use a humidifier. Inhale steam 3 to 4 times a day (for example, sit in the bathroom with the shower running). Apply a warm, moist washcloth to your face 3 to 4 times a day, or as directed by your health care provider. Use saline nasal sprays to help moisten and clean your sinuses. Take medicines only as directed by your health care provider. If you were prescribed either an antibiotic or antifungal medicine, finish it all even if you start to feel better. SEEK IMMEDIATE MEDICAL CARE IF: You have increasing pain or severe headaches. You have nausea, vomiting, or drowsiness. You have swelling around your face. You have vision problems. You have a stiff neck. You have difficulty breathing. MAKE SURE YOU:  Understand these instructions. Will watch your condition. Will get help right away if you are not doing well or get worse. Document Released: 02/22/2005 Document Revised: 07/09/2013 Document Reviewed: 03/09/2011 Exit[employer]   Patient Information 2015 [employer]. This information is not intended to replace advice given to you by your health care provider. Make sure you discuss any questions you have with your health care provider.   HOW TO TREAT VIRAL COUGH AND COLD SYMPTOMS:  -Symptoms usually last at least 1 week with the worst symptoms being around day 4.  - colds usually start with a sore throat and end with a cough, and the cough can take 2 weeks to get better.  -No antibiotics are needed for colds, flu, sore throats, cough, bronchitis UNLESS symptoms are longer than 7 days OR if you are getting better then get drastically worse.  -There are a lot of combination medications (Dayquil,  Nyquil, Vicks 44, tyelnol cold and sinus, ETC). Please look at the ingredients on the back so that you are treating the correct symptoms and not doubling up on medications/ingredients.    Medicines you can use  Nasal congestion  - pseudoephedrine (Sudafed)- behind the counter, do not use if you have high blood pressure, medicine that have -D in them.  - phenylephrine (Sudafed PE) -Dextormethorphan + chlorpheniramine (Coridcidin HBP)- okay if you have high blood pressure -Oxymetazoline (Afrin) nasal spray- LIMIT to 3 days -Saline nasal spray -Neti pot (used distilled or bottled water)  Ear pain/congestion  -pseudoephedrine (sudafed) - Nasonex/flonase nasal spray  Fever  -Acetaminophen (Tyelnol) -Ibuprofen (Advil, motrin, aleve)  Sore Throat  -Acetaminophen (Tyelnol) -Ibuprofen (Advil, motrin, aleve) -Drink a lot of water -Gargle with salt water - Rest your voice (don't talk) -Throat sprays -Cough drops  Body Aches  -Acetaminophen (Tyelnol) -Ibuprofen (Advil, motrin, aleve)  Headache  -Acetaminophen (Tyelnol) -Ibuprofen (Advil, motrin, aleve) - Exedrin, Exedrin Migraine  Allergy symptoms (cough, sneeze, runny nose, itchy eyes) -Claritin or loratadine cheapest but likely the weakest  -Zyrtec or certizine at night because it can make you sleepy -The strongest is allegra or fexafinadine  Cheapest at [employer], [employer], [employer]  Cough  -Dextromethorphan (Delsym)- medicine that has DM in it -Guafenesin (Mucinex/Robitussin) - cough drops - drink lots of water  Chest Congestion  -Guafenesin (Mucinex/Robitussin)  Red Itchy Eyes  - Naphcon-A  Upset Stomach  - Bland diet (nothing spicy, greasy, fried, and high acid foods like tomatoes, oranges, berries) -OKAY- cereal, bread, soup, crackers, rice -Eat smaller more frequent meals -reduce caffeine, no alcohol -Loperamide (Imodium-AD) if diarrhea -Prevacid for heart burn  General health when sick  -Hydration -wash your  hands frequently -keep surfaces clean -change pillow cases and sheets often -Get fresh air but do not exercise strenuously -Vitamin D, double up on it - Vitamin C -Zinc       

## 2015-04-24 LAB — VARICELLA ZOSTER ANTIBODY, IGG: VARICELLA IGG: 779.9 {index} — AB (ref <135.00)

## 2015-04-30 ENCOUNTER — Encounter: Payer: Self-pay | Admitting: Physician Assistant

## 2015-04-30 ENCOUNTER — Ambulatory Visit (INDEPENDENT_AMBULATORY_CARE_PROVIDER_SITE_OTHER): Payer: BLUE CROSS/BLUE SHIELD | Admitting: Physician Assistant

## 2015-04-30 VITALS — BP 120/64 | HR 77 | Temp 97.9°F | Resp 16 | Ht 67.0 in | Wt 226.4 lb

## 2015-04-30 DIAGNOSIS — R059 Cough, unspecified: Secondary | ICD-10-CM

## 2015-04-30 DIAGNOSIS — I429 Cardiomyopathy, unspecified: Secondary | ICD-10-CM

## 2015-04-30 DIAGNOSIS — R05 Cough: Secondary | ICD-10-CM

## 2015-04-30 DIAGNOSIS — I428 Other cardiomyopathies: Secondary | ICD-10-CM

## 2015-04-30 MED ORDER — PREDNISONE 20 MG PO TABS: ORAL_TABLET | ORAL | Status: DC

## 2015-04-30 MED ORDER — IPRATROPIUM-ALBUTEROL 0.5-2.5 (3) MG/3ML IN SOLN
3.0000 mL | Freq: Once | RESPIRATORY_TRACT | Status: AC
  Administered 2015-04-30: 3 mL via RESPIRATORY_TRACT

## 2015-04-30 MED ORDER — LEVOFLOXACIN 500 MG PO TABS: 500.0000 mg | ORAL_TABLET | Freq: Every day | ORAL | Status: DC

## 2015-04-30 MED ORDER — ALBUTEROL SULFATE HFA 108 (90 BASE) MCG/ACT IN AERS: 2.0000 | INHALATION_SPRAY | Freq: Four times a day (QID) | RESPIRATORY_TRACT | Status: DC | PRN

## 2015-04-30 NOTE — Progress Notes (Signed)
Subjective:    Patient ID: [name], female    DOB: [birth year], 60 y.o.   MRN: [id number]  HPI 60 y.o. obese WF with history of CHF has cough x the 15th. Started zpak on the 24th. She has not had anymore fever since Saturday, but continue to have dry cough, very little sinus congestion. Has SOB, wheezing, tightness but no CP. No weight gain, no orthopnea, PND.   She states since Saturday she has had acute pinpoint pain in right shoulder blade, worse with touch.   Wt Readings from Last 3 Encounters:  04/30/15 226 lb 6.4 oz (102.694 kg)  04/23/15 233 lb (105.688 kg)  03/21/15 231 lb 12.8 oz (105.144 kg)   Blood pressure 120/64, pulse 77, temperature 97.9 F (36.6 C), temperature source Temporal, resp. rate 16, height 5\' 7"  (1.702 m), weight 226 lb 6.4 oz (102.694 kg), SpO2 98 %.  Current Outpatient Prescriptions on File Prior to Visit  Medication Sig Dispense Refill  . aspirin 81 MG tablet Take 162 mg by mouth at bedtime.     Marland Kitchen aspirin-acetaminophen-caffeine (EXCEDRIN MIGRAINE) 250-250-65 MG per tablet Take 2 tablets by mouth daily as needed for migraine.    . carvedilol (COREG) 25 MG tablet TAKE 1 TABLET (25 MG TOTAL) BY MOUTH 2 (TWO) TIMES DAILY WITH A MEAL 180 tablet 1  . diazepam (VALIUM) 5 MG tablet Take 1 tablet (5 mg total) by mouth at bedtime as needed for anxiety. (Patient taking differently: Take 2.5-5 mg by mouth daily as needed for anxiety. ) 30 tablet 0  . estradiol (ESTRACE) 2 MG tablet Take 2 mg by mouth daily.    . fenofibrate micronized (LOFIBRA) 134 MG capsule Take 1 capsule (134 mg total) by mouth daily before breakfast. 90 capsule 3  . imipramine (TOFRANIL) 25 MG tablet TAKE 1 TO 2 TABLETS BY MOUTH EVERY DAY 180 tablet PRN  . INVOKAMET XR 50-1000 MG TB24 Take 1 tablet by mouth 2 (two) times daily.  1  . loratadine (CLARITIN) 10 MG tablet Take 10 mg by mouth daily.     Marland Kitchen losartan (COZAAR) 100 MG tablet Take 50 mg by mouth daily.    Marland Kitchen NOVOLIN 70/30 RELION (70-30)  100 UNIT/ML injection Inject 75 Units into the skin 2 (two) times daily.  0  . pantoprazole (PROTONIX) 40 MG tablet Take 40 mg by mouth daily as needed (For acid reflux).     . Potassium 99 MG TABS Take 1 tablet by mouth daily.    . rosuvastatin (CRESTOR) 40 MG tablet Take 1 tablet (40 mg total) by mouth daily. 90 tablet 3  . spironolactone (ALDACTONE) 25 MG tablet TAKE 1 TABLET (25 MG TOTAL) BY MOUTH 2 (TWO) TIMES DAILY. 180 tablet 1  . verapamil (CALAN SR) 180 MG CR tablet Take 1 tablet (180 mg total) by mouth at bedtime. 90 tablet 3   No current facility-administered medications on file prior to visit.   Past Medical History  Diagnosis Date  . Hyperlipidemia   . Hypertension 06/25/2011    Renal dopplers - superior mesenteric artery >50% diameter reduction; R renal artery - normal patency; L proximal renal artery 1-59% reduction (lower end of scale); both kidneys normal in size/symmetry with normal cortex and medulla  . Diabetes mellitus   . Automatic implantable cardiac defibrillator in situ     [employer] 7122  . Unspecified sleep apnea   . Leg pain 12/09/2010    doppler of R femoral artery -  no evidence of dissection, AV fistula, pseudoaneurysm or other vascular abnormalities  . Sleep apnea     on CPAP - AHI during sleep study was 44  . Obesity   . Type II or unspecified type diabetes mellitus without mention of complication, not stated as uncontrolled   . Type II or unspecified type diabetes mellitus without mention of complication, not stated as uncontrolled   . CHF (congestive heart failure) (HCC)   . Migraines    Review of Systems  Constitutional: Positive for fatigue. Negative for fever and chills.  HENT: Positive for congestion, rhinorrhea, sinus pressure and sore throat. Negative for dental problem, ear discharge, ear pain, nosebleeds, trouble swallowing and voice change.   Respiratory: Positive for cough, chest tightness, shortness of breath and wheezing.    Cardiovascular: Negative.   Gastrointestinal: Negative.   Genitourinary: Negative.   Musculoskeletal: Negative.   Neurological: Negative.        Objective:   Physical Exam  Constitutional: She is oriented to person, place, and time. She appears well-developed and well-nourished.  HENT:  Head: Normocephalic and atraumatic.  Right Ear: External ear normal.  Left Ear: External ear normal.  Nose: Nose normal.  Mouth/Throat: Oropharynx is clear and moist.  Eyes: Conjunctivae are normal. Pupils are equal, round, and reactive to light.  Neck: Normal range of motion. Neck supple.  Cardiovascular: Normal rate and regular rhythm.   Pulmonary/Chest: Effort normal. No respiratory distress. She has wheezes (diffuse). She has no rales. She exhibits no tenderness.  Abdominal: Soft. Bowel sounds are normal.  Lymphadenopathy:    She has no cervical adenopathy.  Neurological: She is alert and oriented to person, place, and time.  Skin: Skin is warm and dry.       Assessment & Plan:  1.Cough - ipratropium-albuterol (DUONEB) 0.5-2.5 (3) MG/3ML nebulizer solution 3 mL; Take 3 mLs by nebulization once. - levofloxacin (LEVAQUIN) 500 MG tablet; Take 1 tablet (500 mg total) by mouth daily.  Dispense: 10 tablet; Refill: 0 - albuterol (PROVENTIL HFA;VENTOLIN HFA) 108 (90 Base) MCG/ACT inhaler; Inhale 2 puffs into the lungs every 6 (six) hours as needed for wheezing or shortness of breath.  Dispense: 1 Inhaler; Refill: 2 - predniSONE (DELTASONE) 20 MG tablet; 2 tablets daily for 3 days, 1 tablet daily for 4 days.  Dispense: 10 tablet; Refill: 0

## 2015-04-30 NOTE — Patient Instructions (Signed)
I will give you a prescription for an antibiotic, but please only take it if you are not feeling better in 7-10 days.  Bronchitis is mostly caused by viruses and the antibiotic will do nothing.  PLEASE TRY TO DO OVER THE COUNTER TREATMENT AND/OR PREDNISONE FOR 5-7 DAYS AND IF YOU ARE NOT GETTING BETTER OR GETTING WORSE THEN YOU CAN START ON AN ANTIBIOTIC GIVEN.  Can take the prednisone AT NIGHT WITH DINNER, it take 8-12 hours to start working so it will NOT affect your sleeping if you take it at night with your food!! Take two pills the first night and 1 or two pill the second night and then 1 pill the other nights.    Rest and stay hydrated.  Make sure you drink plenty of fluids to make sure urine is clear when you urinate.  Water will help thin out mucous. - Take Mucinex DM- Maximum Strength over the counter to thin out and cough up the thick mucous.  Please follow directions on box. -Take Albuterol if prescribed.  Risk of antibiotic use: About 1 in 4 people who take antibiotics have side effects including stomach problems, dizziness, or rashes. Those problems clear up soon after stopping the drugs, but in rare cases antibiotics can cause severe allergic reaction. Over use of antibiotics also encourages the growth of bacteria that can't be controlled easily with drugs. That makes you more vunerable to antibiotic-resistant infections and undermines the benefits of antibiotics for others.   Waste of Money: Antibiotics often aren't very expensive, but any money spent on unnecessary drugs is money down the drain.   When are antibiotics needed? Only when symptoms last longer than a week.  Start to improve but then worsen again  Please call the office or message through My Chart if you have any questions.   Acute Bronchitis Bronchitis is when the airways that extend from the windpipe into the lungs get red, puffy, and painful (inflamed). Bronchitis often causes thick spit (mucus) to develop. This  leads to a cough. A cough is the most common symptom of bronchitis. In acute bronchitis, the condition usually begins suddenly and goes away over time (usually in 2 weeks). Smoking, allergies, and asthma can make bronchitis worse. Repeated episodes of bronchitis may cause more lung problems.  Most common cause of Bronchitis is viruses (rhinovirus, coronavirus, RSV).  Therefore, not requiring an antibiotic; as antibiotics only treat bacterial infections.  HOME CARE  Rest.  Drink enough fluids to keep your pee (urine) clear or pale yellow (unless you need to limit fluids as told by your doctor).  Only take over-the-counter or prescription medicines as told by your doctor.  Avoid smoking and secondhand smoke. These can make bronchitis worse. If you are a smoker, think about using nicotine gum or skin patches. Quitting smoking will help your lungs heal faster.  Reduce the chance of getting bronchitis again by:  Washing your hands often.  Avoiding people with cold symptoms.  Trying not to touch your hands to your mouth, nose, or eyes.  Follow up with your doctor as told. GET HELP IF: Your symptoms do not improve after 1 week of treatment. Symptoms include:  Cough.  Fever.  Coughing up thick spit.  Body aches.  Chest congestion.  Chills.  Shortness of breath.  Sore throat. GET HELP RIGHT AWAY IF:   You have an increased fever.  You have chills.  You have severe shortness of breath.  You have bloody thick spit (sputum).    You throw up (vomit) often.  You lose too much body fluid (dehydration).  You have a severe headache.  You faint. MAKE SURE YOU:   Understand these instructions.  Will watch your condition.  Will get help right away if you are not doing well or get worse. Document Released: 08/11/2007 Document Revised: 10/25/2012 Document Reviewed: 08/15/2012 [employer] Patient Information 2015 [employer]. This information is not intended to replace  advice given to you by your health care provider. Make sure you discuss any questions you have with your health care provider.   

## 2015-05-18 ENCOUNTER — Other Ambulatory Visit: Payer: Self-pay | Admitting: Physician Assistant

## 2015-05-19 ENCOUNTER — Ambulatory Visit: Payer: BLUE CROSS/BLUE SHIELD | Admitting: Cardiovascular Disease

## 2015-05-19 ENCOUNTER — Other Ambulatory Visit: Payer: Self-pay | Admitting: Cardiovascular Disease

## 2015-05-19 NOTE — Telephone Encounter (Signed)
REFILL 

## 2015-06-23 ENCOUNTER — Ambulatory Visit (INDEPENDENT_AMBULATORY_CARE_PROVIDER_SITE_OTHER): Payer: BLUE CROSS/BLUE SHIELD | Admitting: *Deleted

## 2015-06-23 DIAGNOSIS — I429 Cardiomyopathy, unspecified: Secondary | ICD-10-CM

## 2015-06-23 DIAGNOSIS — Z950 Presence of cardiac pacemaker: Secondary | ICD-10-CM

## 2015-06-23 DIAGNOSIS — I428 Other cardiomyopathies: Secondary | ICD-10-CM

## 2015-06-23 NOTE — Progress Notes (Signed)
Remote pacemaker transmission.   

## 2015-07-11 ENCOUNTER — Other Ambulatory Visit: Payer: Self-pay | Admitting: Cardiovascular Disease

## 2015-07-11 NOTE — Telephone Encounter (Signed)
Rx Refill

## 2015-07-25 ENCOUNTER — Encounter: Payer: Self-pay | Admitting: Physician Assistant

## 2015-07-31 ENCOUNTER — Encounter: Payer: Self-pay | Admitting: Cardiology

## 2015-07-31 LAB — CUP PACEART REMOTE DEVICE CHECK
Battery Remaining Longevity: 101 mo
Brady Statistic AP VP Percent: 3.4 %
Brady Statistic AS VS Percent: 1 %
Implantable Lead Location: 753860
Implantable Lead Model: 7122
Lead Channel Impedance Value: 480 Ohm
Lead Channel Sensing Intrinsic Amplitude: 12 mV
Lead Channel Setting Pacing Amplitude: 2 V
Lead Channel Setting Pacing Amplitude: 2.5 V
Lead Channel Setting Pacing Pulse Width: 0.6 ms
MDC IDC LEAD IMPLANT DT: 20091016
MDC IDC LEAD IMPLANT DT: 20091016
MDC IDC LEAD IMPLANT DT: 20091016
MDC IDC LEAD LOCATION: 753858
MDC IDC LEAD LOCATION: 753859
MDC IDC LEAD MODEL: 4196
MDC IDC MSMT BATTERY REMAINING PERCENTAGE: 95.5 %
MDC IDC MSMT BATTERY VOLTAGE: 2.99 V
MDC IDC MSMT LEADCHNL LV IMPEDANCE VALUE: 730 Ohm
MDC IDC MSMT LEADCHNL RA IMPEDANCE VALUE: 400 Ohm
MDC IDC MSMT LEADCHNL RA SENSING INTR AMPL: 3.4 mV
MDC IDC SESS DTM: 20170417060036
MDC IDC SET LEADCHNL RA PACING AMPLITUDE: 2 V
MDC IDC SET LEADCHNL RV PACING PULSEWIDTH: 0.3 ms
MDC IDC SET LEADCHNL RV SENSING SENSITIVITY: 2 mV
MDC IDC STAT BRADY AP VS PERCENT: 1 %
MDC IDC STAT BRADY AS VP PERCENT: 96 %
MDC IDC STAT BRADY RA PERCENT PACED: 3.5 %
Pulse Gen Model: 3222
Pulse Gen Serial Number: 7688750

## 2015-08-06 ENCOUNTER — Encounter: Payer: Self-pay | Admitting: Cardiovascular Disease

## 2015-08-08 ENCOUNTER — Telehealth: Payer: Self-pay

## 2015-08-13 NOTE — Telephone Encounter (Signed)
Letter excusing patient from team-building activity drafted, signed by Dr Tresa Endo and available for patient pick-up.

## 2015-09-17 ENCOUNTER — Encounter: Payer: Self-pay | Admitting: Physician Assistant

## 2015-09-22 ENCOUNTER — Ambulatory Visit (INDEPENDENT_AMBULATORY_CARE_PROVIDER_SITE_OTHER): Payer: BLUE CROSS/BLUE SHIELD | Admitting: *Deleted

## 2015-09-22 ENCOUNTER — Telehealth: Payer: Self-pay | Admitting: Cardiology

## 2015-09-22 DIAGNOSIS — Z950 Presence of cardiac pacemaker: Secondary | ICD-10-CM

## 2015-09-22 DIAGNOSIS — I429 Cardiomyopathy, unspecified: Secondary | ICD-10-CM

## 2015-09-22 DIAGNOSIS — I428 Other cardiomyopathies: Secondary | ICD-10-CM

## 2015-09-22 NOTE — Telephone Encounter (Signed)
Spoke with pt and reminded pt of remote transmission that is due today. Pt verbalized understanding.   

## 2015-09-23 NOTE — Progress Notes (Signed)
Remote pacemaker transmission.   

## 2015-09-24 ENCOUNTER — Encounter: Payer: Self-pay | Admitting: Cardiology

## 2015-09-25 LAB — CUP PACEART REMOTE DEVICE CHECK
Battery Remaining Longevity: 103 mo
Battery Remaining Percentage: 95.5 %
Battery Voltage: 2.99 V
Brady Statistic AS VP Percent: 96 %
Brady Statistic RA Percent Paced: 3.8 %
Date Time Interrogation Session: 20170718000902
Implantable Lead Implant Date: 20091016
Implantable Lead Implant Date: 20091016
Implantable Lead Location: 753858
Implantable Lead Model: 4196
Lead Channel Sensing Intrinsic Amplitude: 11.8 mV
Lead Channel Sensing Intrinsic Amplitude: 4.3 mV
Lead Channel Setting Pacing Amplitude: 2 V
Lead Channel Setting Pacing Amplitude: 2 V
Lead Channel Setting Pacing Pulse Width: 0.6 ms
MDC IDC LEAD IMPLANT DT: 20091016
MDC IDC LEAD LOCATION: 753859
MDC IDC LEAD LOCATION: 753860
MDC IDC LEAD MODEL: 7122
MDC IDC MSMT LEADCHNL LV IMPEDANCE VALUE: 790 Ohm
MDC IDC MSMT LEADCHNL RA IMPEDANCE VALUE: 410 Ohm
MDC IDC MSMT LEADCHNL RV IMPEDANCE VALUE: 510 Ohm
MDC IDC PG SERIAL: 7688750
MDC IDC SET LEADCHNL RV PACING AMPLITUDE: 2.5 V
MDC IDC SET LEADCHNL RV PACING PULSEWIDTH: 0.3 ms
MDC IDC SET LEADCHNL RV SENSING SENSITIVITY: 2 mV
MDC IDC STAT BRADY AP VP PERCENT: 3.7 %
MDC IDC STAT BRADY AP VS PERCENT: 1 %
MDC IDC STAT BRADY AS VS PERCENT: 1 %

## 2015-10-10 ENCOUNTER — Other Ambulatory Visit: Payer: Self-pay | Admitting: Cardiovascular Disease

## 2015-10-10 DIAGNOSIS — I10 Essential (primary) hypertension: Secondary | ICD-10-CM

## 2015-10-10 NOTE — Telephone Encounter (Signed)
Rx(s) sent to pharmacy electronically.  

## 2015-10-14 ENCOUNTER — Ambulatory Visit (INDEPENDENT_AMBULATORY_CARE_PROVIDER_SITE_OTHER): Payer: BLUE CROSS/BLUE SHIELD | Admitting: Physician Assistant

## 2015-10-14 ENCOUNTER — Encounter: Payer: Self-pay | Admitting: Physician Assistant

## 2015-10-14 VITALS — BP 106/60 | HR 82 | Temp 97.3°F | Resp 14 | Ht 67.0 in | Wt 228.0 lb

## 2015-10-14 DIAGNOSIS — M79605 Pain in left leg: Secondary | ICD-10-CM | POA: Diagnosis not present

## 2015-10-14 NOTE — Patient Instructions (Signed)
Varicose Veins Varicose veins are veins that have become enlarged and twisted. CAUSES This condition is the result of valves in the veins not working properly. Valves in the veins help return blood from the leg to the heart. When your calf muscles squeeze, the blood moves up your leg then the valves close and this continues until the blood gets back to your heart.  If these valves are damaged, blood flows backwards and backs up into the veins in the leg near the skin OR if your are sitting/standing for a long time without using your calf muscles the blood will back up into the veins in your legs. This causes the veins to become larger. People who are on their feet a lot, sit a lot without walking (like on a plane, at a desk, or in a car), who are pregnant, or who are overweight are more likely to develop varicose veins. SYMPTOMS   Bulging, twisted-appearing, bluish veins, most commonly found on the legs.  Leg pain or a feeling of heaviness. These symptoms may be worse at the end of the day.  Leg swelling.  Skin color changes. DIAGNOSIS  Varicose veins can usually be diagnosed with an exam of your legs by your caregiver. He or she may recommend an ultrasound of your leg veins. TREATMENT  Most varicose veins can be treated at home.However, other treatments are available for people who have persistent symptoms or who want to treat the cosmetic appearance of the varicose veins. But this is only cosmetic and they will return if not properly treated. These include:  Laser treatment of very small varicose veins.  Medicine that is shot (injected) into the vein. This medicine hardens the walls of the vein and closes off the vein. This treatment is called sclerotherapy. Afterwards, you may need to wear clothing or bandages that apply pressure.  Surgery. HOME CARE INSTRUCTIONS   Do not stand or sit in one position for long periods of time. Do not sit with your legs crossed. Rest with your legs raised  during the day.  Your legs have to be higher than your heart so that gravity will force the valves to open, so please really elevate your legs.   Wear elastic stockings or support hose. Do not wear other tight, encircling garments around the legs, pelvis, or waist.  [employer]  has a wide variety of well priced compression stockings. [address] [place], [place] [address] [phone]  OR THERE ARE COPPER INFUSED COMPRESSION SOCKS AT [facility] OR [facility]  Walk as much as possible to increase blood flow.  Raise the foot of your bed at night with 2-inch blocks.  If you get a cut in the skin over the vein and the vein bleeds, lie down with your leg raised and press on it with a clean cloth until the bleeding stops. Then place a bandage (dressing) on the cut. See your caregiver if it continues to bleed or needs stitches. SEEK MEDICAL CARE IF:   The skin around your ankle starts to break down.  You have pain, redness, tenderness, or hard swelling developing in your leg over a vein.  You are uncomfortable due to leg pain. Document Released: 12/02/2004 Document Revised: 05/17/2011 Document Reviewed: 04/20/2010 [facility] Patient Information 2014 [place], [place].   Phlebitis Phlebitis is soreness and swelling (inflammation) of a vein. This can occur in your arms, legs, or torso (trunk), as well as deeper inside your body. Phlebitis is usually not serious when it occurs  close to the surface of the body. However, it can cause serious problems when it occurs in a vein deeper inside the body. CAUSES  Phlebitis can be triggered by various things, including:   Reduced blood flow through your veins. This can happen with:  Bed rest over a long period.  Long-distance travel.  Injury.  Surgery.  Being overweight (obese) or pregnant.  Having an IV tube put in the vein and getting certain medicines through the vein.  Cancer and cancer treatment.  Use of illegal drugs taken through the  vein.  Inflammatory diseases.  Inherited (genetic) diseases that increase the risk of blood clots.  Hormone therapy, such as birth control pills. SIGNS AND SYMPTOMS   Red, tender, swollen, and painful area on your skin. Usually, the area will be long and narrow.  Firmness along the center of the affected area. This can indicate that a blood clot has formed.  Low-grade fever. DIAGNOSIS  A health care provider can usually diagnose phlebitis by examining the affected area and asking about your symptoms. To check for infection or blood clots, your health care provider may order blood tests or an ultrasound exam of the area. Blood tests and your family history may also indicate if you have an underlying genetic disease that causes blood clots. Occasionally, a piece of tissue is taken from the body (biopsy sample) if an unusual cause of phlebitis is suspected. TREATMENT  Treatment will vary depending on the severity of the condition and the area of the body affected. Treatment may include:  Use of a warm compress or heating pad.  Use of compression stockings or bandages.  Anti-inflammatory medicines.  Removal of any IV tube that may be causing the problem.  Medicines that kill germs (antibiotics) if an infection is present.  Blood-thinning medicines if a blood clot is suspected or present.  In rare cases, surgery may be needed to remove damaged sections of vein. HOME CARE INSTRUCTIONS   Only take over-the-counter or prescription medicines as directed by your health care provider. Take all medicines exactly as prescribed.  Raise (elevate) the affected area above the level of your heart as directed by your health care provider.  Apply a warm compress or heating pad to the affected area as directed by your health care provider. Do not sleep with the heating pad.  Use compression stockings or bandages as directed. These will speed healing and prevent the condition from coming  back.  If you are on blood thinners:  Get follow-up blood tests as directed by your health care provider.  Check with your health care provider before using any new medicines.  Carry a medical alert card or wear your medical alert jewelry to show that you are on blood thinners.  For phlebitis in the legs:  Avoid prolonged standing or bed rest.  Keep your legs moving. Raise your legs when sitting or lying.  Do not smoke.  Women, particularly those over the age of 74, should consider the risks and benefits of taking the contraceptive pill. This kind of hormone treatment can increase your risk for blood clots.  Follow up with your health care provider as directed. SEEK MEDICAL CARE IF:   You have unusual bruising or any bleeding problems.  Your swelling or pain in the affected area is not improving.  You are on anti-inflammatory medicine, and you develop belly (abdominal) pain. SEEK IMMEDIATE MEDICAL CARE IF:   You have a sudden onset of chest pain or difficulty breathing.  You have a fever or persistent symptoms for more than 2-3 days.  You have a fever and your symptoms suddenly get worse. MAKE SURE YOU:  Understand these instructions.  Will watch your condition.  Will get help right away if you are not doing well or get worse.   This information is not intended to replace advice given to you by your health care provider. Make sure you discuss any questions you have with your health care provider.   Document Released: 02/16/2001 Document Revised: 12/13/2012 Document Reviewed: 10/30/2012 Elsevier Interactive Patient Education [employer]

## 2015-10-14 NOTE — Progress Notes (Addendum)
Subjective:    Patient ID: [name], female    DOB: [birth year], 60 y.o.   MRN: [id number]  HPI 60 y.o. WF presents with knot on her left ankle x 4 days, has some aching in calf, painful to palpation. No warmth, redness. Has been sitting a lot for her job. Has noticed veins in legs are "bulging and achy".  Patient denies risk of DVT: estrogen replacement therapy, history of deep venous thrombosis, history of pulmonary embolus, history of malignancy, long duration of automobile or plane travel, oral contraceptive use, prolonged stay in bed, recent limb injury or fracture and recent surgery  Blood pressure 106/60, pulse 82, temperature 97.3 F (36.3 C), temperature source Temporal, resp. rate 14, height 5\' 7"  (1.702 m), weight 228 lb (103.4 kg).  Medications Current Outpatient Prescriptions on File Prior to Visit  Medication Sig  . aspirin 81 MG tablet Take 162 mg by mouth at bedtime.   Marland Kitchen aspirin-acetaminophen-caffeine (EXCEDRIN MIGRAINE) 250-250-65 MG per tablet Take 2 tablets by mouth daily as needed for migraine.  . carvedilol (COREG) 25 MG tablet TAKE 1 TABLET (25 MG TOTAL) BY MOUTH 2 (TWO) TIMES DAILY WITH A MEAL  . diazepam (VALIUM) 5 MG tablet Take 1 tablet (5 mg total) by mouth at bedtime as needed for anxiety. (Patient taking differently: Take 2.5-5 mg by mouth daily as needed for anxiety. )  . estradiol (ESTRACE) 2 MG tablet Take 2 mg by mouth daily.  . fenofibrate micronized (LOFIBRA) 134 MG capsule Take 1 capsule (134 mg total) by mouth daily before breakfast.  . imipramine (TOFRANIL) 25 MG tablet TAKE 1 TO 2 TABLETS BY MOUTH EVERY DAY  . INVOKAMET XR 50-1000 MG TB24 Take 1 tablet by mouth 2 (two) times daily.  Marland Kitchen loratadine (CLARITIN) 10 MG tablet Take 10 mg by mouth daily.   Marland Kitchen losartan (COZAAR) 100 MG tablet Take 50 mg by mouth daily.  Marland Kitchen NOVOLIN 70/30 RELION (70-30) 100 UNIT/ML injection Inject 75 Units into the skin 2 (two) times daily.  . pantoprazole (PROTONIX) 40 MG  tablet Take 40 mg by mouth daily as needed (For acid reflux).   . Potassium 99 MG TABS Take 1 tablet by mouth daily.  . rosuvastatin (CRESTOR) 40 MG tablet Take 1 tablet (40 mg total) by mouth daily.  Marland Kitchen spironolactone (ALDACTONE) 25 MG tablet TAKE 1 TABLET (25 MG TOTAL) BY MOUTH 2 (TWO) TIMES DAILY.  . verapamil (CALAN-SR) 180 MG CR tablet TAKE 1 TABLET(180 MG) BY MOUTH AT BEDTIME  . verapamil (CALAN-SR) 180 MG CR tablet Take 1 tablet (180 mg total) by mouth at bedtime. PLEASE CONTACT OFFICE FOR ADDITIONAL REFILLS   No current facility-administered medications on file prior to visit.     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   Review of Systems  Constitutional: Negative.   HENT: Negative.   Respiratory: Negative.   Cardiovascular: Negative.   Gastrointestinal: Negative.   Musculoskeletal: Positive for myalgias.  Skin: Negative.   Neurological: Negative.  Objective:   Physical Exam  Constitutional: She is oriented to person, place, and time. She appears well-developed and well-nourished.  HENT:  Head: Normocephalic and atraumatic.  Right Ear: External ear normal.  Left Ear: External ear normal.  Mouth/Throat: Oropharynx is clear and moist.  Eyes: Conjunctivae and EOM are normal. Pupils are equal, round, and reactive to light.  Neck: Normal  range of motion. Neck supple. No thyromegaly present.  Cardiovascular: Normal rate, regular rhythm and normal heart sounds.  Exam reveals no gallop and no friction rub.   No murmur heard. Pulmonary/Chest: Effort normal and breath sounds normal. No respiratory distress. She has no wheezes.  Abdominal: Soft. Bowel sounds are normal. She exhibits no distension and no mass. There is no tenderness. There is no rebound and no guarding.  Musculoskeletal: Normal range of motion.  Left lower lateral leg 2 inches above malleolus with hard tender palpable cord, no warmth, no distal edema, no erythema. Negative homen's sign  Lymphadenopathy:    She has no cervical adenopathy.  Neurological: She is alert and oriented to person, place, and time. She displays normal reflexes. No cranial nerve deficit. Coordination normal.  Skin: Skin is warm and dry.  Psychiatric: She has a normal mood and affect.       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 Patient reassured.

## 2015-10-15 LAB — D-DIMER, QUANTITATIVE (NOT AT ARMC): D DIMER QUANT: 0.28 ug{FEU}/mL (ref <0.50)

## 2015-10-16 ENCOUNTER — Other Ambulatory Visit: Payer: Self-pay | Admitting: Cardiovascular Disease

## 2015-10-17 ENCOUNTER — Encounter: Payer: Self-pay | Admitting: Cardiovascular Disease

## 2015-10-21 ENCOUNTER — Ambulatory Visit: Payer: Self-pay | Admitting: Physician Assistant

## 2015-11-03 ENCOUNTER — Other Ambulatory Visit: Payer: Self-pay | Admitting: Cardiovascular Disease

## 2015-11-03 DIAGNOSIS — I10 Essential (primary) hypertension: Secondary | ICD-10-CM

## 2015-11-03 NOTE — Telephone Encounter (Signed)
Rx request sent to pharmacy.  

## 2015-11-09 IMAGING — US US SOFT TISSUE HEAD/NECK
1 series · 14 of 18 positions shown · non-contrast
Comparison: None.

CLINICAL DATA: Right submandibular/cervical adenopathy.

EXAM:
ULTRASOUND OF HEAD/NECK SOFT TISSUES
TECHNIQUE: Ultrasound examination of the head and neck soft tissues was
performed in the area of clinical concern.

[Series 1: us soft tissue head/neck · 0.08mm/px · 14 of 18 slices shown]
[im 1/18]
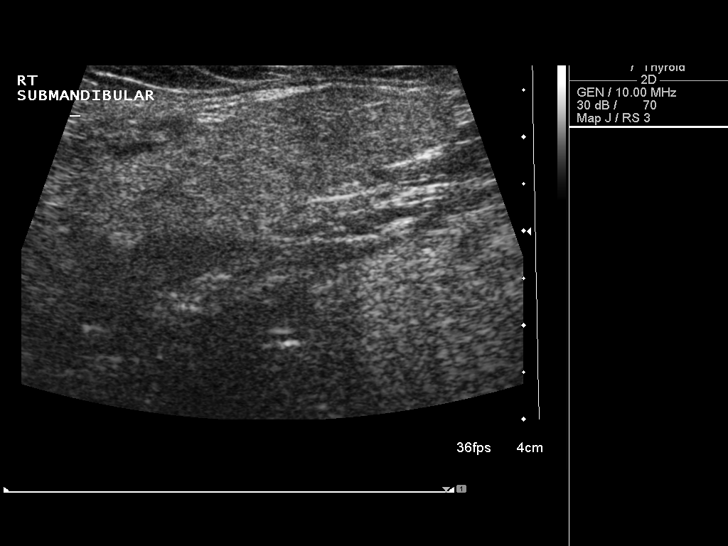
[im 2/18]
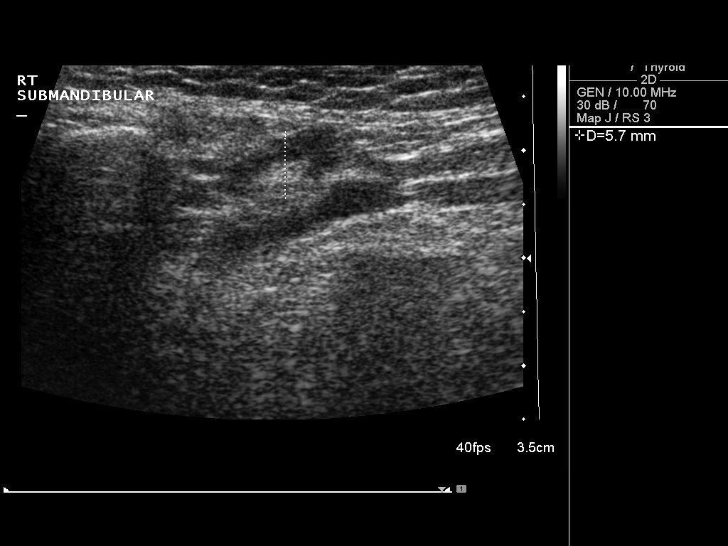
[im 4/18]
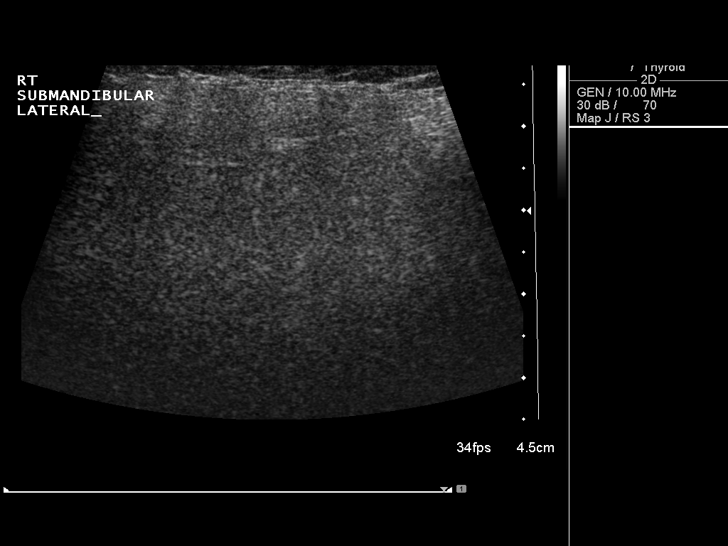
[im 5/18]
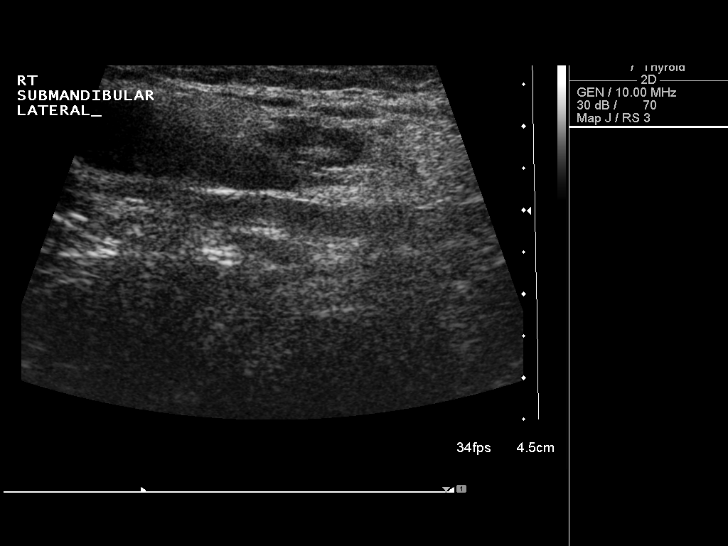
[im 6/18]
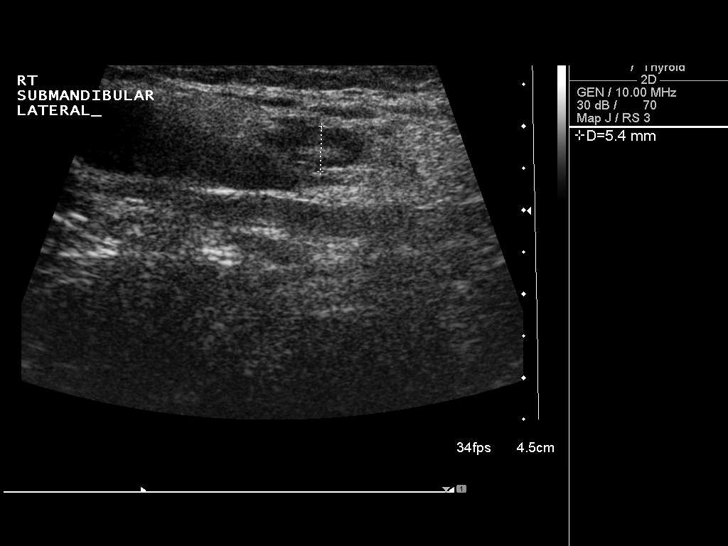
[im 8/18]
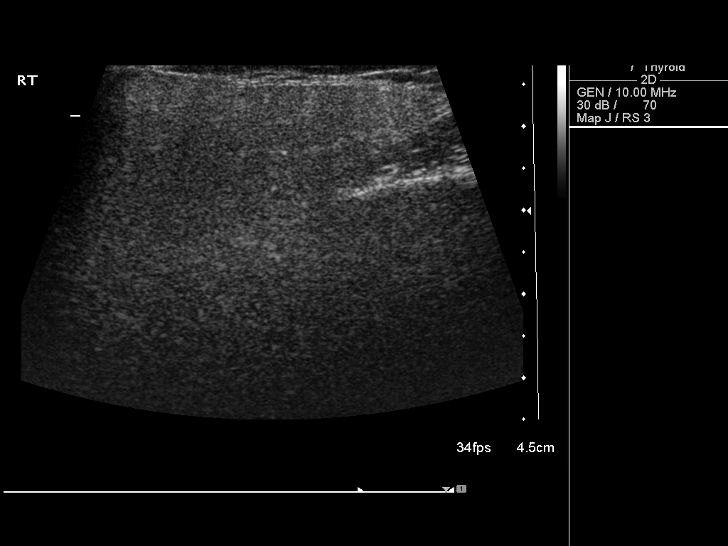
[im 9/18]
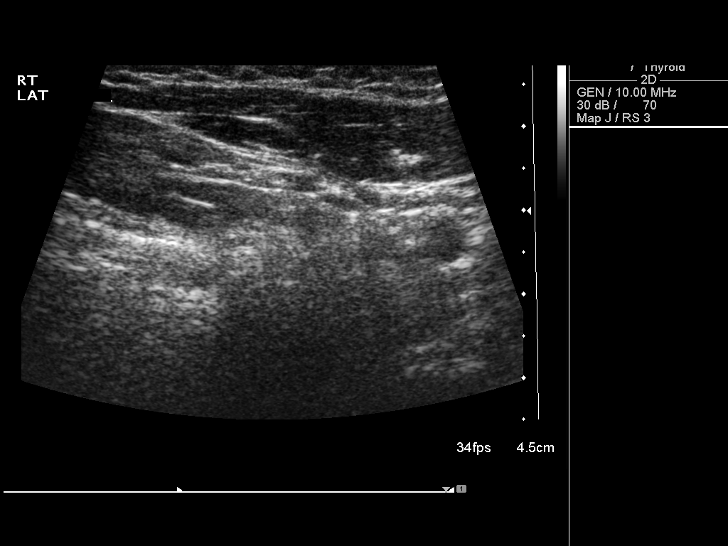
[im 10/18]
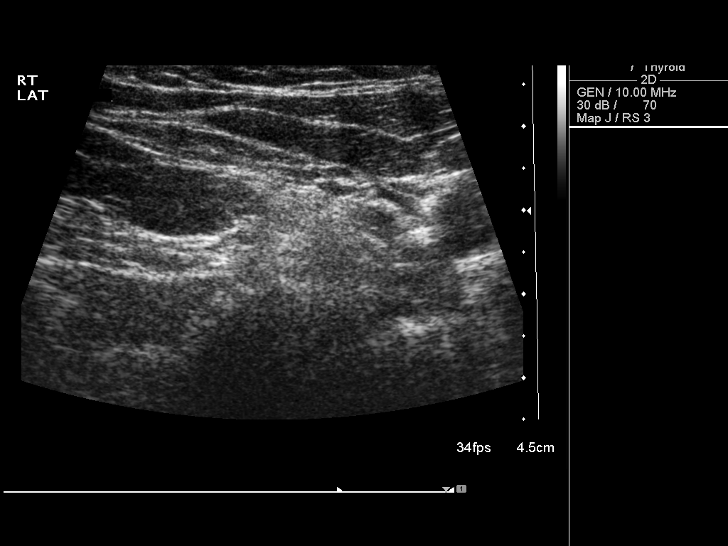
[im 11/18]
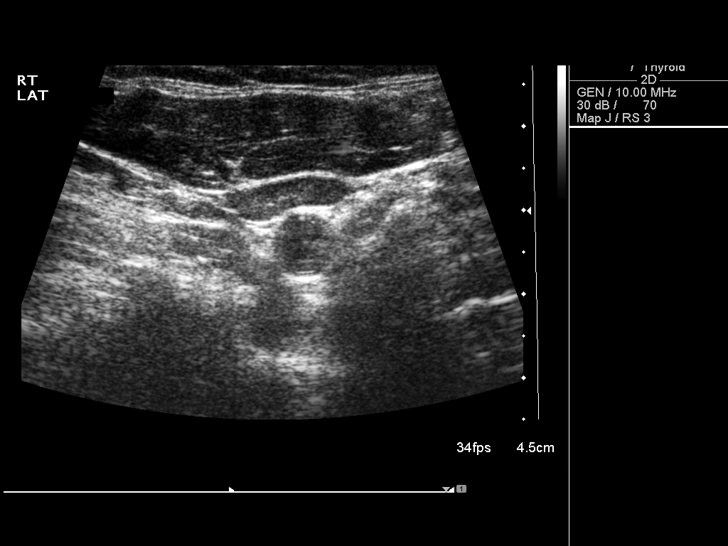
[im 13/18]
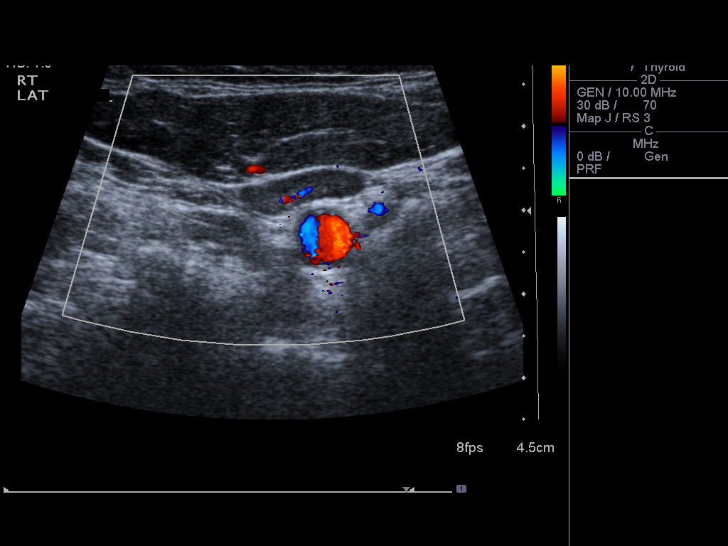
[im 14/18]
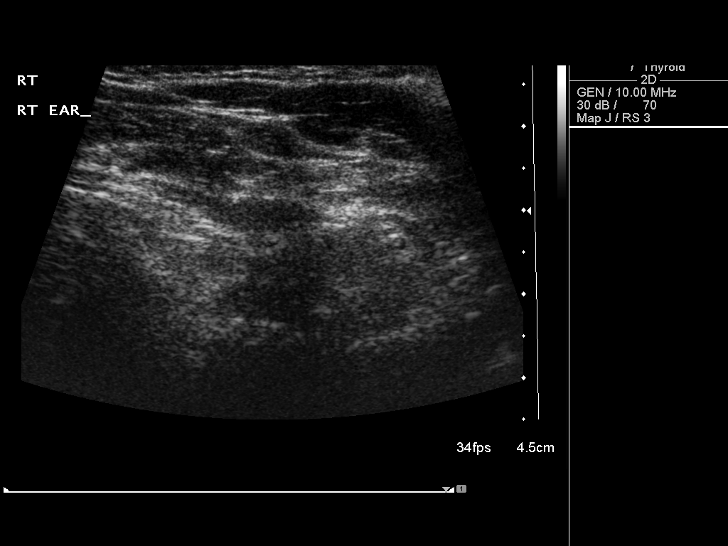
[im 15/18]
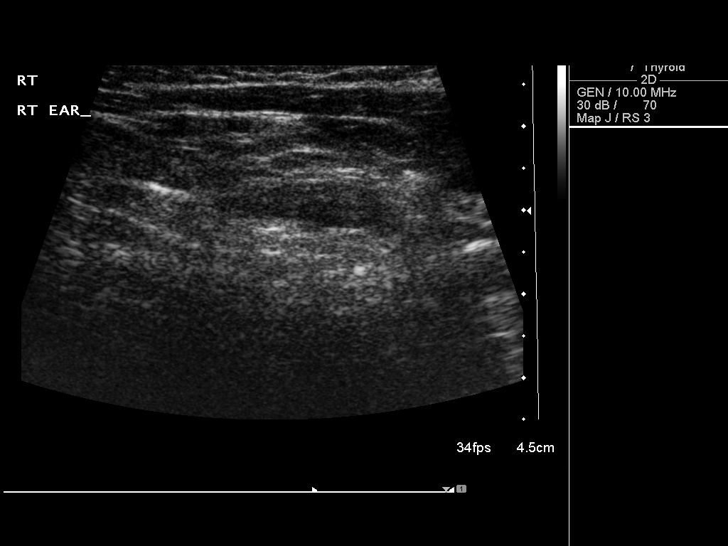
[im 17/18]
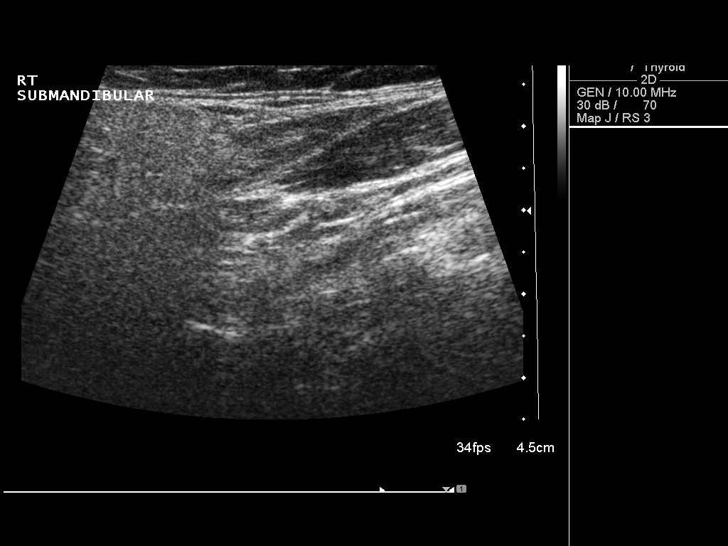
[im 18/18]
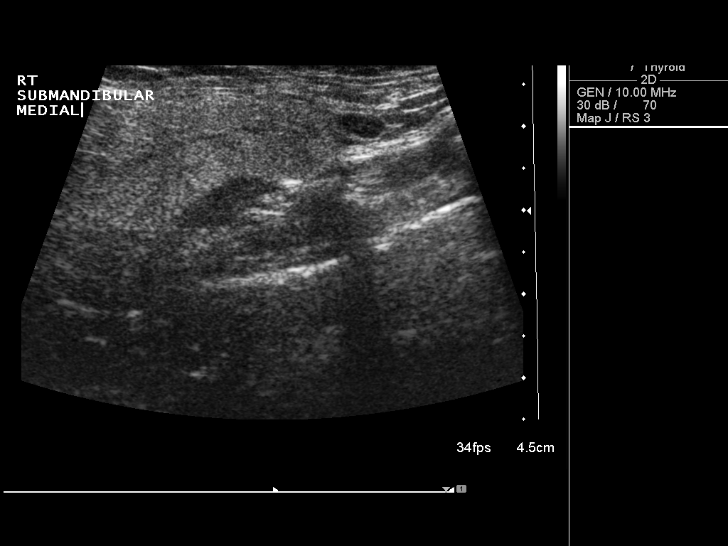

[14 of 18 positions shown; findings below may reference images not displayed]

FINDINGS: Ultrasound was performed from the right ankle of the mandible to the
right clavicle. No cystic or solid masses seen. Small non
pathologically enlarged cervical lymph nodes, 6 mm or less in short
axis diameter. These maintain a normal central fatty hilum.
IMPRESSION: No abnormality seen within the right neck sonographically.

## 2015-11-11 ENCOUNTER — Other Ambulatory Visit: Payer: Self-pay | Admitting: Cardiovascular Disease

## 2015-11-17 ENCOUNTER — Encounter: Payer: Self-pay | Admitting: Cardiovascular Disease

## 2015-11-18 ENCOUNTER — Other Ambulatory Visit: Payer: Self-pay | Admitting: Obstetrics

## 2015-11-18 DIAGNOSIS — R928 Other abnormal and inconclusive findings on diagnostic imaging of breast: Secondary | ICD-10-CM

## 2015-11-19 ENCOUNTER — Ambulatory Visit (INDEPENDENT_AMBULATORY_CARE_PROVIDER_SITE_OTHER): Payer: BLUE CROSS/BLUE SHIELD | Admitting: Physician Assistant

## 2015-11-19 ENCOUNTER — Encounter: Payer: Self-pay | Admitting: Physician Assistant

## 2015-11-19 ENCOUNTER — Other Ambulatory Visit: Payer: Self-pay | Admitting: Cardiovascular Disease

## 2015-11-19 VITALS — BP 126/72 | HR 84 | Temp 97.3°F | Resp 14 | Ht 66.0 in | Wt 229.0 lb

## 2015-11-19 DIAGNOSIS — E559 Vitamin D deficiency, unspecified: Secondary | ICD-10-CM | POA: Diagnosis not present

## 2015-11-19 DIAGNOSIS — N182 Chronic kidney disease, stage 2 (mild): Secondary | ICD-10-CM

## 2015-11-19 DIAGNOSIS — Z9989 Dependence on other enabling machines and devices: Secondary | ICD-10-CM

## 2015-11-19 DIAGNOSIS — Z0000 Encounter for general adult medical examination without abnormal findings: Secondary | ICD-10-CM | POA: Diagnosis not present

## 2015-11-19 DIAGNOSIS — E1322 Other specified diabetes mellitus with diabetic chronic kidney disease: Secondary | ICD-10-CM

## 2015-11-19 DIAGNOSIS — Z794 Long term (current) use of insulin: Secondary | ICD-10-CM

## 2015-11-19 DIAGNOSIS — E7800 Pure hypercholesterolemia, unspecified: Secondary | ICD-10-CM

## 2015-11-19 DIAGNOSIS — E1165 Type 2 diabetes mellitus with hyperglycemia: Secondary | ICD-10-CM

## 2015-11-19 DIAGNOSIS — I5022 Chronic systolic (congestive) heart failure: Secondary | ICD-10-CM

## 2015-11-19 DIAGNOSIS — G43809 Other migraine, not intractable, without status migrainosus: Secondary | ICD-10-CM

## 2015-11-19 DIAGNOSIS — E1143 Type 2 diabetes mellitus with diabetic autonomic (poly)neuropathy: Secondary | ICD-10-CM

## 2015-11-19 DIAGNOSIS — Z79899 Other long term (current) drug therapy: Secondary | ICD-10-CM | POA: Diagnosis not present

## 2015-11-19 DIAGNOSIS — K219 Gastro-esophageal reflux disease without esophagitis: Secondary | ICD-10-CM

## 2015-11-19 DIAGNOSIS — I5189 Other ill-defined heart diseases: Secondary | ICD-10-CM

## 2015-11-19 DIAGNOSIS — F411 Generalized anxiety disorder: Secondary | ICD-10-CM

## 2015-11-19 DIAGNOSIS — E1365 Other specified diabetes mellitus with hyperglycemia: Secondary | ICD-10-CM

## 2015-11-19 DIAGNOSIS — Z950 Presence of cardiac pacemaker: Secondary | ICD-10-CM

## 2015-11-19 DIAGNOSIS — Z0001 Encounter for general adult medical examination with abnormal findings: Secondary | ICD-10-CM

## 2015-11-19 DIAGNOSIS — I10 Essential (primary) hypertension: Secondary | ICD-10-CM

## 2015-11-19 DIAGNOSIS — G4733 Obstructive sleep apnea (adult) (pediatric): Secondary | ICD-10-CM

## 2015-11-19 DIAGNOSIS — IMO0002 Reserved for concepts with insufficient information to code with codable children: Secondary | ICD-10-CM

## 2015-11-19 DIAGNOSIS — I428 Other cardiomyopathies: Secondary | ICD-10-CM

## 2015-11-19 LAB — CBC WITH DIFFERENTIAL/PLATELET
BASOS ABS: 88 {cells}/uL (ref 0–200)
Basophils Relative: 1 %
EOS ABS: 176 {cells}/uL (ref 15–500)
EOS PCT: 2 %
HCT: 46.3 % — ABNORMAL HIGH (ref 35.0–45.0)
HEMOGLOBIN: 15.7 g/dL — AB (ref 11.7–15.5)
LYMPHS ABS: 2992 {cells}/uL (ref 850–3900)
Lymphocytes Relative: 34 %
MCH: 30.3 pg (ref 27.0–33.0)
MCHC: 33.9 g/dL (ref 32.0–36.0)
MCV: 89.4 fL (ref 80.0–100.0)
MPV: 9.3 fL (ref 7.5–12.5)
Monocytes Absolute: 704 cells/uL (ref 200–950)
Monocytes Relative: 8 %
NEUTROS ABS: 4840 {cells}/uL (ref 1500–7800)
NEUTROS PCT: 55 %
Platelets: 290 10*3/uL (ref 140–400)
RBC: 5.18 MIL/uL — ABNORMAL HIGH (ref 3.80–5.10)
RDW: 14.5 % (ref 11.0–15.0)
WBC: 8.8 10*3/uL (ref 3.8–10.8)

## 2015-11-19 LAB — HEPATIC FUNCTION PANEL
ALT: 32 U/L — ABNORMAL HIGH (ref 6–29)
AST: 27 U/L (ref 10–35)
Albumin: 4.6 g/dL (ref 3.6–5.1)
Alkaline Phosphatase: 51 U/L (ref 33–130)
BILIRUBIN DIRECT: 0.1 mg/dL (ref <=0.2)
BILIRUBIN INDIRECT: 0.5 mg/dL (ref 0.2–1.2)
TOTAL PROTEIN: 7.5 g/dL (ref 6.1–8.1)
Total Bilirubin: 0.6 mg/dL (ref 0.2–1.2)

## 2015-11-19 LAB — LIPID PANEL
CHOLESTEROL: 100 mg/dL — AB (ref 125–200)
HDL: 29 mg/dL — ABNORMAL LOW (ref >=46)
LDL Cholesterol: 44 mg/dL (ref <130)
TRIGLYCERIDES: 133 mg/dL (ref <150)
Total CHOL/HDL Ratio: 3.4 Ratio (ref <=5.0)
VLDL: 27 mg/dL (ref <30)

## 2015-11-19 LAB — BASIC METABOLIC PANEL WITH GFR
BUN: 16 mg/dL (ref 7–25)
CALCIUM: 9.7 mg/dL (ref 8.6–10.4)
CHLORIDE: 105 mmol/L (ref 98–110)
CO2: 27 mmol/L (ref 20–31)
CREATININE: 0.89 mg/dL (ref 0.50–1.05)
GFR, Est African American: 82 mL/min (ref >=60)
GFR, Est Non African American: 71 mL/min (ref >=60)
GLUCOSE: 86 mg/dL (ref 65–99)
Potassium: 3.8 mmol/L (ref 3.5–5.3)
SODIUM: 140 mmol/L (ref 135–146)

## 2015-11-19 LAB — MAGNESIUM: Magnesium: 1.8 mg/dL (ref 1.5–2.5)

## 2015-11-19 LAB — TSH: TSH: 1.84 mIU/L

## 2015-11-19 MED ORDER — LOSARTAN POTASSIUM 100 MG PO TABS: 50.0000 mg | ORAL_TABLET | Freq: Every day | ORAL | 2 refills | Status: DC

## 2015-11-19 NOTE — Progress Notes (Signed)
Complete Physical  Assessment and Plan: 1. Essential hypertension - continue medications, DASH diet, exercise and monitor at home. Call if greater than 130/80.  - CBC with Differential/Platelet - Hepatic function panel - TSH  2. HYPERCHOLESTEROLEMIA -continue medications, check lipids, decrease fatty foods, increase activity.  - Lipid panel  3. 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 + neuropathy, added to problem list, declines meds at this time.  - BASIC METABOLIC PANEL WITH GFR - Urinalysis, Routine w reflex microscopic - Microalbumin / creatinine urine ratio  4. Chronic systolic heart failure Control blood pressure, cholesterol, glucose, increase exercise.  Continue cardio follow up Monitor weight daily  5. Nonischemic cardiomyopathy Control blood pressure, cholesterol, glucose, increase exercise.  Continue cardio follow up Monitor weight daily  6. Other migraine without status migrainosus, not intractable Controlled, avoid triggers  7. OSA on CPAP Sleep apnea- continue CPAP, weight loss advised.   8. Diastolic dysfunction Monitor weight, control BP  9. Biventricular cardiac pacemaker in situ No AICD due to improvement of EF with medication, now has pacemaker  10. Obesity Obesity with co morbidities- long discussion about weight loss, diet, and exercise  11. Gastroesophageal reflux disease, esophagitis presence not specified GERD- will try to get off PPiI given info for taper and zantac sent in  12. Generalized anxiety disorder continue medications, stress management techniques discussed, increase water, good sleep hygiene discussed, increase exercise, and increase veggies.   13. Vitamin D deficiency - Vit  D  25 hydroxy (rtn osteoporosis monitoring)  14. Medication management - Magnesium   Discussed med's effects and SE's. Screening labs and tests as requested with regular follow-up as recommended.  HPI 60 y.o. female  presents for a complete physical. Her blood pressure has been controlled at home, today their BP is BP: 126/72 She does not workout regularly, she walks a little. She denies chest pain, shortness of breath, dizziness.  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 Tresa Endo and follows with them regularly. She is on BB, ARB, spirolactone, and torsemide. She has varicose veins, had negative Ddimer, getting set up to see Ddimer, worse at night.  She is on cholesterol medication, crestor 40mg  and fenofibrate and denies myalgias. Her cholesterol is at goal. The cholesterol last visit was:   Lab Results  Component Value Date   CHOL 88 07/25/2014   HDL 21 (L) 07/25/2014   LDLCALC 37 07/25/2014   TRIG 149 07/25/2014   CHOLHDL 4.2 07/25/2014   She has been working on diet and exercise for Diabetes with diabetic chronic kidney disease, with other circulatory complications and with diabetic polyneuropathy. She follows up with Dr. Talmage Nap for her uncontrolled DM, she was switched to Novolog 70/30 mix 55 in the morning and 55 at night, she states sugars have been 130's to 140's.  She see's Dr. Hazle Quant for eye exams, last one feb 2017, she is on bASA, she is on ACE/ARB, and denies hypoglycemia , polydipsia, polyuria and visual disturbances. Does have paresthesias in bilateral feet/legs.   Last A1C was:  Lab Results  Component Value Date   HGBA1C 11.3 (H) 07/24/2013  Patient is on Vitamin D supplement.  Lab Results  Component Value Date   VD25OH 28 (L) 07/25/2014  Has history of migraines.  She follows with Dr. Nicholas Lose, has had several seb cyst around breast.  Mobid obesity with noncompliance- BMI is Body mass index is 36.96  kg/m., she is working on diet and exercise. She has OSA and is on CPAP  Wt Readings from Last 3 Encounters:  11/19/15 229 lb (103.9 kg)  10/14/15 228 lb (103.4 kg)  04/30/15 226 lb 6.4 oz (102.7 kg)   Current Medications:  Current Outpatient Prescriptions on File Prior to Visit  Medication Sig Dispense Refill  . aspirin 81 MG tablet Take 162 mg by mouth at bedtime.     Marland Kitchen aspirin-acetaminophen-caffeine (EXCEDRIN MIGRAINE) 250-250-65 MG per tablet Take 2 tablets by mouth daily as needed for migraine.    . carvedilol (COREG) 25 MG tablet TAKE 1 TABLET (25 MG TOTAL) BY MOUTH 2 (TWO) TIMES DAILY WITH A MEAL 180 tablet 3  . diazepam (VALIUM) 5 MG tablet Take 1 tablet (5 mg total) by mouth at bedtime as needed for anxiety. (Patient taking differently: Take 2.5-5 mg by mouth daily as needed for anxiety. ) 30 tablet 0  . estradiol (ESTRACE) 2 MG tablet Take 2 mg by mouth daily.    . fenofibrate micronized (LOFIBRA) 134 MG capsule TAKE 1 CAPSULE BY MOUTH ONCE DAILY BEFORE BREAKFAST 90 capsule 1  . imipramine (TOFRANIL) 25 MG tablet TAKE 1 TO 2 TABLETS BY MOUTH EVERY DAY 180 tablet 1  . INVOKAMET XR 50-1000 MG TB24 Take 1 tablet by mouth 2 (two) times daily.  1  . loratadine (CLARITIN) 10 MG tablet Take 10 mg by mouth daily.     Marland Kitchen losartan (COZAAR) 100 MG tablet Take 50 mg by mouth daily.    Marland Kitchen NOVOLIN 70/30 RELION (70-30) 100 UNIT/ML injection Inject 75 Units into the skin 2 (two) times daily.  0  . pantoprazole (PROTONIX) 40 MG tablet Take 40 mg by mouth daily as needed (For acid reflux).     . Potassium 99 MG TABS Take 1 tablet by mouth daily.    . rosuvastatin (CRESTOR) 40 MG tablet Take 1 tablet (40 mg total) by mouth daily. 90 tablet 3  . spironolactone (ALDACTONE) 25 MG tablet TAKE 1 TABLET (25 MG TOTAL) BY MOUTH 2 (TWO) TIMES DAILY. 180 tablet 3  . verapamil (CALAN-SR) 180 MG CR tablet Take 1 tablet (180 mg total) by mouth at bedtime. Please schedule appointment for refills. 30 tablet 0   No  current facility-administered medications on file prior to visit.    Health Maintenance:   Immunization History  Administered Date(s) Administered  . Influenza-Unspecified 11/22/2014   Tetanus: Patient states has had had within last 10 years, will wait Pneumovax: declines Prevnar 13: due age 41 Flu vaccine: 2016 Zostavax: N/A Pap: Sept 2016 Dr. Chestine Spore appointment in June MGM: sept 2016 has done at their office, has call back and goes back Friday DEXA: 2006 t -1.3 DUE, declines at this time Colonoscopy: Dec 2007 due this year will schedule EGD: Dec 2007 Stress test 2012 Echo 09/2013 US soft tissues: 08/2013 CT AB 2010  Patient Care Team: Lucky Cowboy, MD as PCP - General (Internal Medicine) Lennette Bihari, MD as Consulting Physician (Cardiology) Fletcher Anon, MD as Consulting Physician (Pediatrics) Bernette Redbird, MD as Consulting Physician (Gastroenterology) Dorisann Frames, MD as Consulting Physician (Endocrinology) Donovan Kail, MD as Consulting Physician (Obstetrics and Gynecology) Venancio Poisson, MD as Consulting Physician (Dermatology)  Allergies:  Allergies  Allergen Reactions  . Flagyl [Metronidazole] Anaphylaxis  . Niacin And Related Other (See Comments)  and Cough    flushing  . Penicillins Swelling      . Yeast-Related Products Swelling and Rash   Medical History:  Past Medical History:  Diagnosis Date  . Automatic implantable cardiac defibrillator in situ    [employer] 7122  . CHF (congestive heart failure) (HCC)   . Diabetes mellitus   . Hyperlipidemia   . Hypertension 06/25/2011   Renal dopplers - superior mesenteric artery >50% diameter reduction; R renal artery - normal patency; L proximal renal artery 1-59% reduction (lower end of scale); both kidneys normal in size/symmetry with normal cortex and medulla  . Leg pain 12/09/2010   doppler of R femoral artery - no evidence of dissection, AV fistula, pseudoaneurysm or other vascular abnormalities  .  Migraines   . Obesity   . Sleep apnea    on CPAP - AHI during sleep study was 44  . Type II or unspecified type diabetes mellitus without mention of complication, not stated as uncontrolled   . Type II or unspecified type diabetes mellitus without mention of complication, not stated as uncontrolled   . Unspecified sleep apnea    Surgical History:  Past Surgical History:  Procedure Laterality Date  . ABDOMINAL HYSTERECTOMY    . BIV PACEMAKER GENERATOR CHANGE OUT N/A 01/16/2014   Procedure: BIV PACEMAKER GENERATOR CHANGE OUT;  Surgeon: Marinus Maw, MD;  Location: [facility] CATH LAB;  Service: Cardiovascular;  Laterality: N/A;  . CARDIAC CATHETERIZATION  07/15/2006   no significant CAD by cardiac cath, confirmed by intravascular [place] of LAD; non-ischemic cardiomyopathy prob related to uncontrolled hypertension, DM and morbid obesity; moderate pulmonary hypertension; preserved cardiac output and cardiac index  . CARDIAC DEFIBRILLATOR PLACEMENT     ICD by Dr. Ladona Ridgel  . CARDIOVASCULAR STRESS TEST  08/28/2010   R/P MV - pattern of normal perfusion in all regions, no scintigraphic evidence of inducible myocardial ischemia; no EKG changes; normal perfusion study; pt did experience chesst pain during strudy, resolved spontaneously  . DOPPLER ECHOCARDIOGRAPHY  06/25/2011   EF >55%; moderate concentric LV hypertrophy; LA mildly dilated, mild mitral annular calcification;   . KNEE SURGERY    . NECK SURGERY    . PACEMAKER INSERTION     Family History:  Family History  Problem Relation Age of Onset  . Heart attack Father   . Hearing loss Father   . Hypertension Father   . Cancer Mother    Social History:  Social History  Substance Use Topics  . Smoking status: Former Smoker    Types: Cigarettes    Quit date: 03/09/1975  . Smokeless tobacco: Not on file  . Alcohol use 0.0 oz/week   Review of Systems  Constitutional: Negative.   HENT: Negative.   Respiratory: Negative for cough, hemoptysis, sputum  production, shortness of breath and wheezing.   Cardiovascular: Negative.   Gastrointestinal: Positive for heartburn. Negative for abdominal pain, blood in stool, constipation, diarrhea, melena, nausea and vomiting.  Genitourinary: Negative.   Musculoskeletal: Positive for back pain and myalgias. Negative for falls, joint pain and neck pain.  Skin: Negative for itching and rash.  Neurological: Positive for tingling. Negative for dizziness, tremors, sensory change, speech change, focal weakness, seizures and loss of consciousness.  Psychiatric/Behavioral: Negative.     Physical Exam: Estimated body mass index is 36.96 kg/m as calculated from the following:   Height as of this encounter: 5\' 6"  (1.676 m).   Weight as of this encounter: 229 lb (103.9 kg). BP 126/72  Pulse 84   Temp 97.3 F (36.3 C)   Resp 14   Ht 5\' 6"  (1.676 m)   Wt 229 lb (103.9 kg)   SpO2 96%   BMI 36.96 kg/m  General Appearance: Well nourished, in no apparent distress. Eyes: PERRLA, EOMs, conjunctiva no swelling or erythema, normal fundi and vessels. Sinuses: No Frontal/maxillary tenderness ENT/Mouth: Ext aud canals clear, normal light reflex with TMs without erythema, bulging.  Good dentition. No erythema, swelling, or exudate on post pharynx. Tonsils not swollen or erythematous. Hearing normal. + TMJ tenderness Neck: Supple, thyroid normal. No bruits Respiratory: Respiratory effort normal, BS equal bilaterally without rales, rhonchi, wheezing or stridor. Cardio: RRR without murmurs, rubs or gallops. Brisk peripheral pulses without edema, with bilateral hard palpable cords without erythema, warmth, tenderness.  Chest: symmetric, with normal excursions and percussion. Breasts: defer Abdomen: obese, soft, nontender without organomegaly.   Lymphatics: Non tender without lymphadenopathy.  Genitourinary: defer Musculoskeletal: Full ROM all peripheral extremities,5/5 strength, and normal gait. Skin: Warm, dry  without rashes, lesions, ecchymosis.  Neuro: Cranial nerves intact, reflexes equal bilaterally. Normal muscle tone, no cerebellar symptoms. Sensation decreased bilateral feet to 2/3 up shin.  Psych: Awake and oriented X 3, normal affect, Insight and Judgment appropriate.   Quentin MullingAmanda Ambriella Kitt 9:31 AM

## 2015-11-20 LAB — MICROALBUMIN / CREATININE URINE RATIO
Creatinine, Urine: 104 mg/dL (ref 20–320)
MICROALB UR: 0.4 mg/dL
MICROALB/CREAT RATIO: 4 ug/mg{creat} (ref <30)

## 2015-11-20 LAB — URINALYSIS, ROUTINE W REFLEX MICROSCOPIC
Bilirubin Urine: NEGATIVE
HGB URINE DIPSTICK: NEGATIVE
Ketones, ur: NEGATIVE
LEUKOCYTES UA: NEGATIVE
NITRITE: NEGATIVE
PH: 7.5 (ref 5.0–8.0)
PROTEIN: NEGATIVE
Specific Gravity, Urine: 1.034 (ref 1.001–1.035)

## 2015-11-20 LAB — URINALYSIS, MICROSCOPIC ONLY
CASTS: NONE SEEN [LPF]
Crystals: NONE SEEN [HPF]

## 2015-11-20 LAB — HEMOGLOBIN A1C
Hgb A1c MFr Bld: 7.5 % — ABNORMAL HIGH (ref <5.7)
Mean Plasma Glucose: 169 mg/dL

## 2015-11-20 LAB — VITAMIN D 25 HYDROXY (VIT D DEFICIENCY, FRACTURES): Vit D, 25-Hydroxy: 38 ng/mL (ref 30–100)

## 2015-11-21 ENCOUNTER — Ambulatory Visit
Admission: RE | Admit: 2015-11-21 | Discharge: 2015-11-21 | Disposition: A | Discharge status: Home / Self Care | Payer: BLUE CROSS/BLUE SHIELD | Source: Ambulatory Visit | Attending: Obstetrics | Admitting: Obstetrics

## 2015-11-21 DIAGNOSIS — R928 Other abnormal and inconclusive findings on diagnostic imaging of breast: Secondary | ICD-10-CM

## 2015-11-23 IMAGING — CR DG CHEST 2V
2 series · 2 of 2 positions shown · non-contrast
Comparison: 08/28/2009

CLINICAL DATA: CHF.  Prior smoker.

EXAM:
CHEST  2 VIEW

[w chest pa]
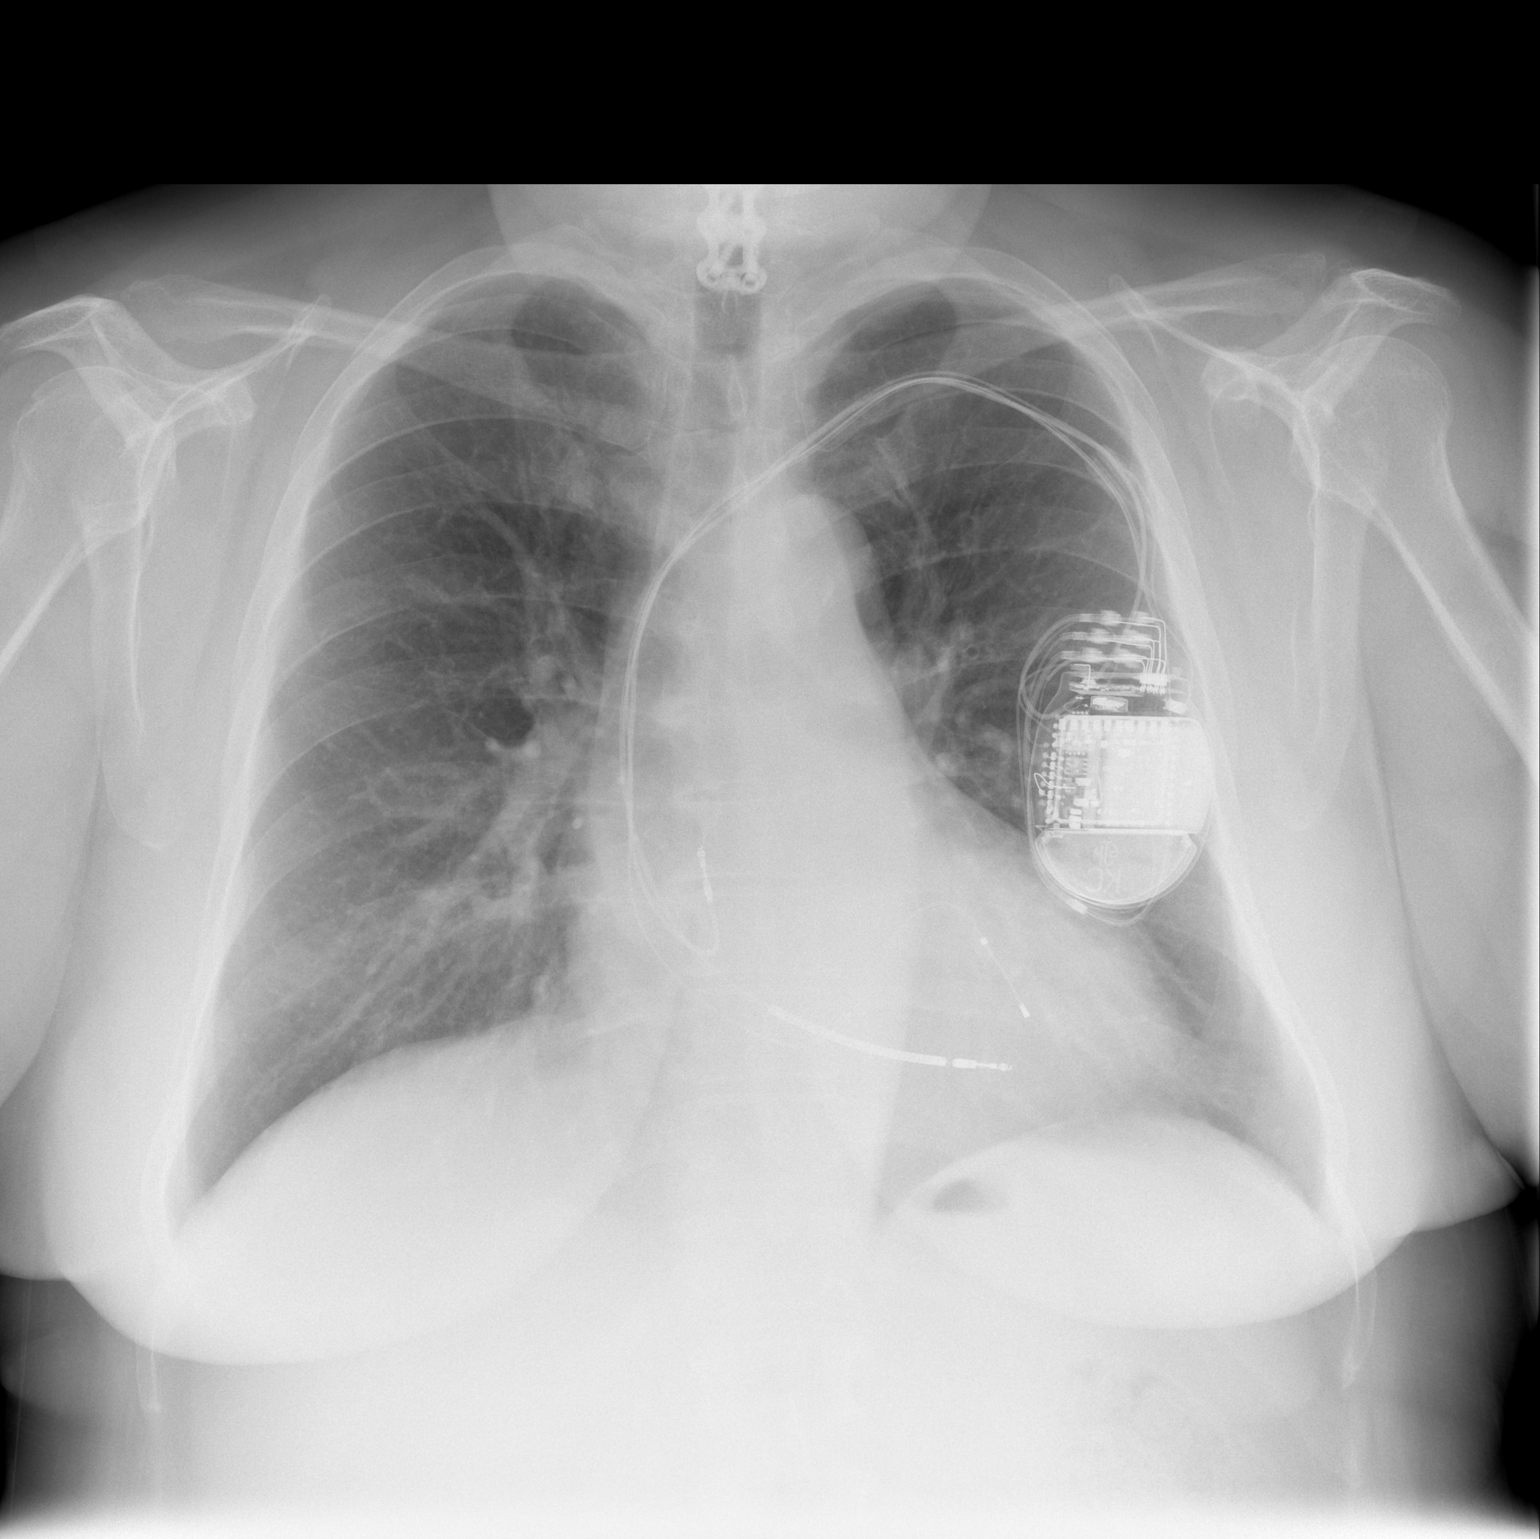

[w chest lat]
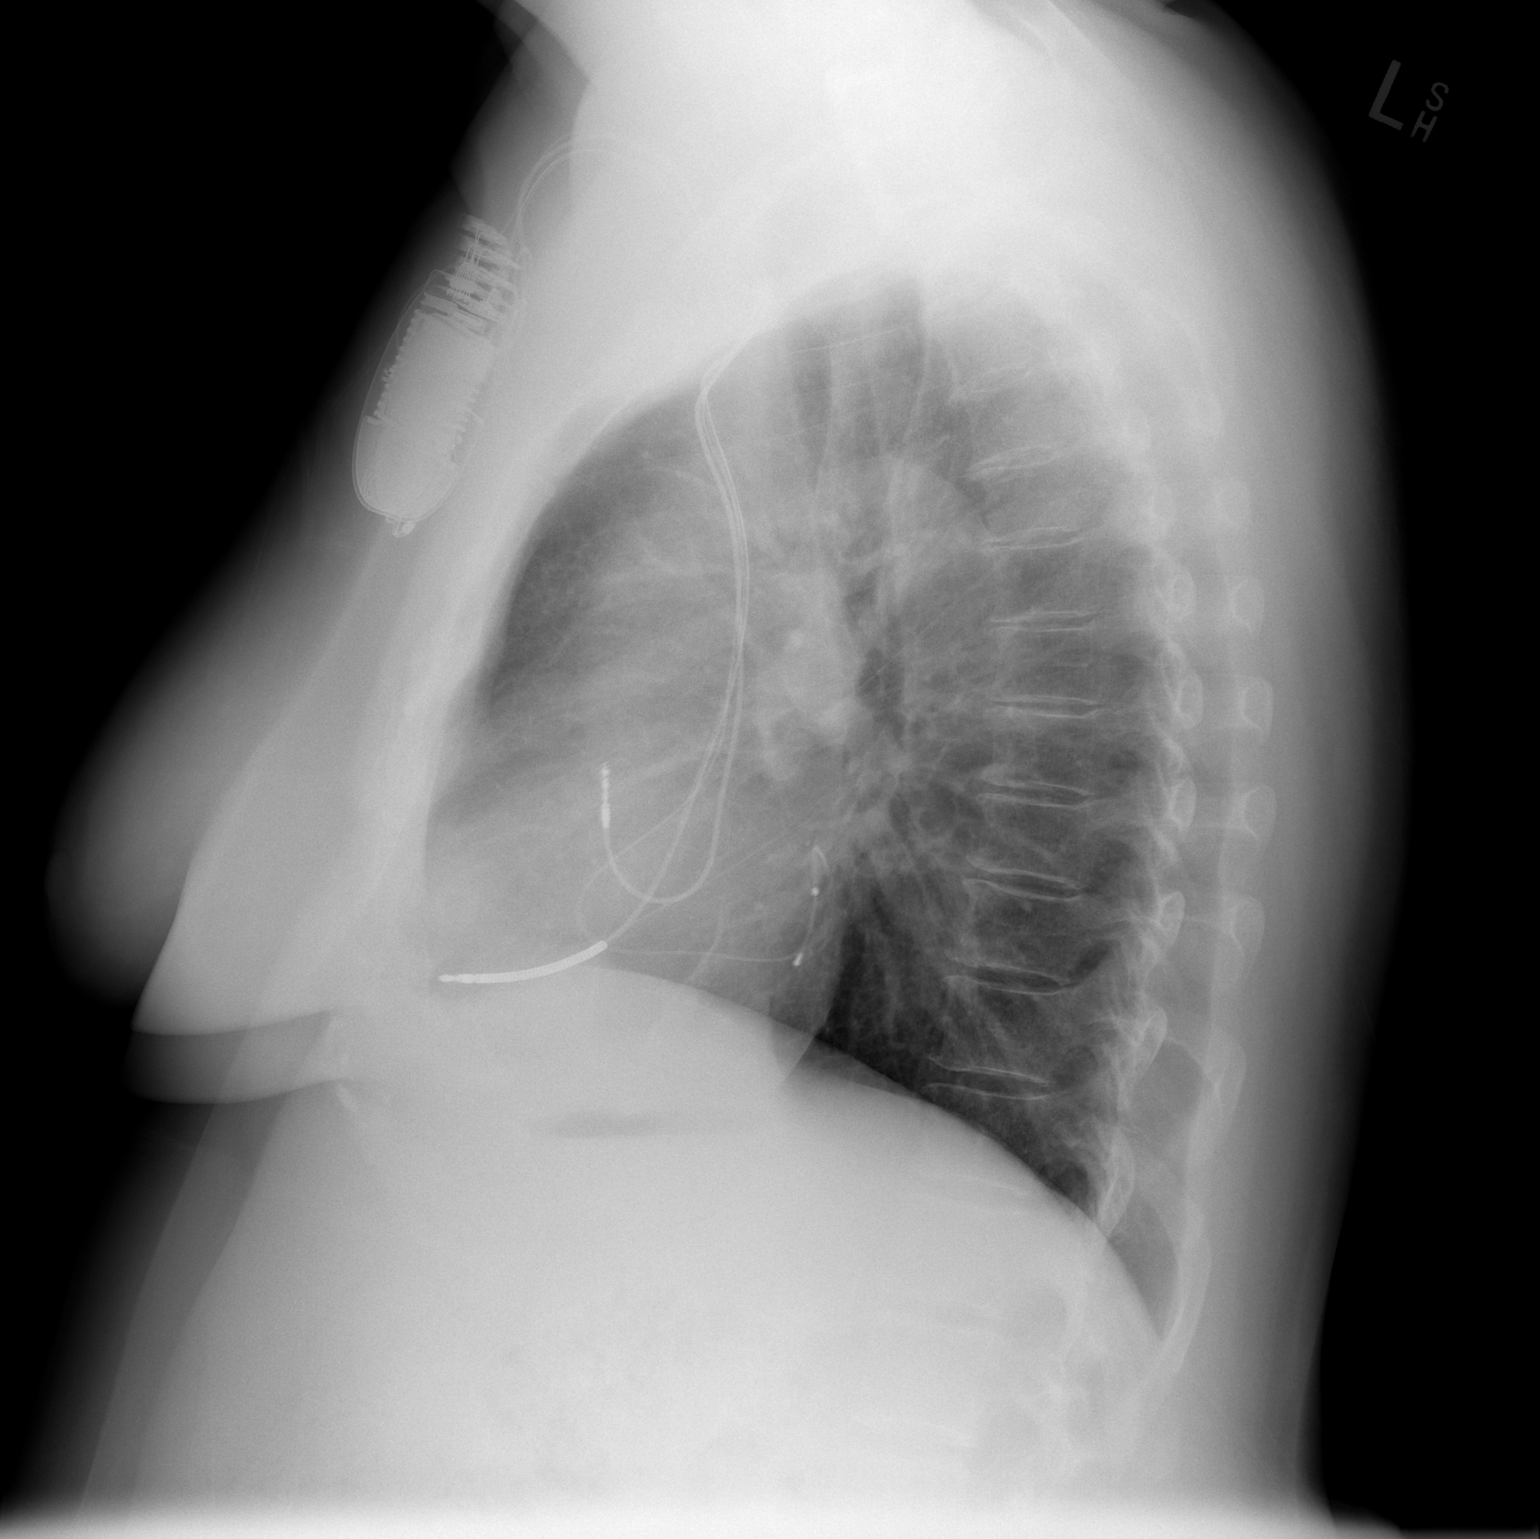

[2 of 2 positions shown; findings below may reference images not displayed]

FINDINGS: Left pacer remains in place, unchanged. Heart is upper limits normal
in size. Lungs are clear. No effusions or acute bony abnormality.
IMPRESSION: No active cardiopulmonary disease.

## 2015-12-10 ENCOUNTER — Other Ambulatory Visit: Payer: Self-pay | Admitting: Cardiovascular Disease

## 2015-12-10 DIAGNOSIS — I10 Essential (primary) hypertension: Secondary | ICD-10-CM

## 2015-12-10 NOTE — Telephone Encounter (Signed)
refill 

## 2015-12-23 ENCOUNTER — Ambulatory Visit (INDEPENDENT_AMBULATORY_CARE_PROVIDER_SITE_OTHER): Payer: BLUE CROSS/BLUE SHIELD | Admitting: *Deleted

## 2015-12-23 ENCOUNTER — Encounter: Payer: Self-pay | Admitting: Physician Assistant

## 2015-12-23 DIAGNOSIS — I428 Other cardiomyopathies: Secondary | ICD-10-CM | POA: Diagnosis not present

## 2015-12-23 DIAGNOSIS — I83813 Varicose veins of bilateral lower extremities with pain: Secondary | ICD-10-CM

## 2015-12-23 NOTE — Progress Notes (Signed)
Remote pacemaker transmission.   

## 2015-12-24 ENCOUNTER — Encounter: Payer: Self-pay | Admitting: Cardiology

## 2016-01-21 LAB — CUP PACEART REMOTE DEVICE CHECK
Battery Remaining Percentage: 95.5 %
Battery Voltage: 2.99 V
Brady Statistic AP VP Percent: 4.2 %
Brady Statistic AS VS Percent: 1 %
Brady Statistic RA Percent Paced: 4.2 %
Implantable Lead Implant Date: 20091016
Implantable Lead Implant Date: 20091016
Implantable Lead Location: 753858
Implantable Lead Location: 753859
Implantable Lead Model: 4196
Implantable Lead Model: 7122
Lead Channel Impedance Value: 460 Ohm
Lead Channel Pacing Threshold Amplitude: 0.75 V
Lead Channel Pacing Threshold Amplitude: 1 V
Lead Channel Pacing Threshold Pulse Width: 0.3 ms
Lead Channel Sensing Intrinsic Amplitude: 3.9 mV
Lead Channel Setting Pacing Amplitude: 2 V
Lead Channel Setting Pacing Amplitude: 2.5 V
Lead Channel Setting Sensing Sensitivity: 2 mV
MDC IDC LEAD IMPLANT DT: 20091016
MDC IDC LEAD LOCATION: 753860
MDC IDC MSMT BATTERY REMAINING LONGEVITY: 101 mo
MDC IDC MSMT LEADCHNL LV IMPEDANCE VALUE: 800 Ohm
MDC IDC MSMT LEADCHNL LV PACING THRESHOLD PULSEWIDTH: 0.6 ms
MDC IDC MSMT LEADCHNL RA IMPEDANCE VALUE: 400 Ohm
MDC IDC MSMT LEADCHNL RA PACING THRESHOLD AMPLITUDE: 0.5 V
MDC IDC MSMT LEADCHNL RV PACING THRESHOLD PULSEWIDTH: 0.3 ms
MDC IDC MSMT LEADCHNL RV SENSING INTR AMPL: 12 mV
MDC IDC PG IMPLANT DT: 20151111
MDC IDC PG SERIAL: 7688750
MDC IDC SESS DTM: 20171016060014
MDC IDC SET LEADCHNL LV PACING PULSEWIDTH: 0.6 ms
MDC IDC SET LEADCHNL RA PACING AMPLITUDE: 2 V
MDC IDC SET LEADCHNL RV PACING PULSEWIDTH: 0.3 ms
MDC IDC STAT BRADY AP VS PERCENT: 1 %
MDC IDC STAT BRADY AS VP PERCENT: 96 %

## 2016-01-23 ENCOUNTER — Other Ambulatory Visit: Payer: Self-pay | Admitting: Obstetrics

## 2016-02-03 ENCOUNTER — Encounter: Payer: Self-pay | Admitting: Physician Assistant

## 2016-02-13 ENCOUNTER — Ambulatory Visit (INDEPENDENT_AMBULATORY_CARE_PROVIDER_SITE_OTHER): Payer: BLUE CROSS/BLUE SHIELD | Admitting: Physician Assistant

## 2016-02-13 ENCOUNTER — Encounter: Payer: Self-pay | Admitting: Physician Assistant

## 2016-02-13 ENCOUNTER — Other Ambulatory Visit: Payer: Self-pay | Admitting: Physician Assistant

## 2016-02-13 VITALS — BP 110/62 | HR 87 | Temp 97.2°F | Resp 14 | Ht 66.0 in | Wt 233.8 lb

## 2016-02-13 DIAGNOSIS — B009 Herpesviral infection, unspecified: Secondary | ICD-10-CM | POA: Diagnosis not present

## 2016-02-13 MED ORDER — VALACYCLOVIR HCL 500 MG PO TABS: 500.0000 mg | ORAL_TABLET | Freq: Every day | ORAL | 6 refills | Status: DC

## 2016-02-13 NOTE — Patient Instructions (Signed)

## 2016-02-13 NOTE — Progress Notes (Signed)
Subjective:    Patient ID: [name], female    DOB: 1955-05-16, 60 y.o.   MRN: [id number]  HPI 60 y.o. WF with recent yeast infection that turned into a herpes infection, cultures HSV 2. Husband tested negative. She is DM, low immune system and would like a maintenance therapy.   Blood pressure 110/62, pulse 87, temperature 97.2 F (36.2 C), resp. rate 14, height 5\' 6"  (1.676 m), weight 233 lb 12.8 oz (106.1 kg), SpO2 96 %.  Medications Current Outpatient Prescriptions on File Prior to Visit  Medication Sig  . aspirin 81 MG tablet Take 162 mg by mouth at bedtime.   Marland Kitchen. aspirin-acetaminophen-caffeine (EXCEDRIN MIGRAINE) 250-250-65 MG per tablet Take 2 tablets by mouth daily as needed for migraine.  . carvedilol (COREG) 25 MG tablet TAKE 1 TABLET (25 MG TOTAL) BY MOUTH 2 (TWO) TIMES DAILY WITH A MEAL  . diazepam (VALIUM) 5 MG tablet Take 1 tablet (5 mg total) by mouth at bedtime as needed for anxiety. (Patient taking differently: Take 2.5-5 mg by mouth daily as needed for anxiety. )  . estradiol (ESTRACE) 2 MG tablet Take 2 mg by mouth daily.  . fenofibrate micronized (LOFIBRA) 134 MG capsule TAKE 1 CAPSULE BY MOUTH ONCE DAILY BEFORE BREAKFAST  . imipramine (TOFRANIL) 25 MG tablet TAKE 1 TO 2 TABLETS BY MOUTH EVERY DAY  . loratadine (CLARITIN) 10 MG tablet Take 10 mg by mouth daily.   Marland Kitchen. losartan (COZAAR) 100 MG tablet Take 0.5 tablets (50 mg total) by mouth daily.  Marland Kitchen. NOVOLIN 70/30 RELION (70-30) 100 UNIT/ML injection Inject 75 Units into the skin 2 (two) times daily.  . pantoprazole (PROTONIX) 40 MG tablet Take 40 mg by mouth daily as needed (For acid reflux).   . Potassium 99 MG TABS Take 1 tablet by mouth daily.  . rosuvastatin (CRESTOR) 40 MG tablet Take 1 tablet (40 mg total) by mouth daily.  Marland Kitchen. spironolactone (ALDACTONE) 25 MG tablet TAKE 1 TABLET (25 MG TOTAL) BY MOUTH 2 (TWO) TIMES DAILY.  . verapamil (CALAN-SR) 180 MG CR tablet TAKE 1 TABLET(180 MG) BY MOUTH AT BEDTIME   No  current facility-administered medications on file prior to visit.     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  Review of Systems  Constitutional: Negative.   HENT: Negative.   Respiratory: Negative.   Cardiovascular: Negative.   Gastrointestinal: Negative.   Genitourinary: Positive for genital sores.  Musculoskeletal: Negative.   Skin: Negative.   Neurological: Negative.   Hematological: Negative.   Psychiatric/Behavioral: Negative.        Objective:   Physical Exam  Constitutional: She is oriented to person, place, and time. She appears well-developed and well-nourished.  HENT:  Head: Normocephalic and atraumatic.  Right Ear: External ear normal.  Left Ear: External ear normal.  Mouth/Throat: Oropharynx is clear and moist.  Eyes: Conjunctivae and EOM are normal. Pupils are equal, round, and reactive to light.  Neck: Normal range of motion. Neck supple. No thyromegaly present.  Cardiovascular: Normal rate, regular rhythm and normal heart sounds.  Exam reveals no gallop and  no friction rub.   No murmur heard. Pulmonary/Chest: Effort normal and breath sounds normal. No respiratory distress. She has no wheezes.  Abdominal: Soft. Bowel sounds are normal. She exhibits no distension and no mass. There is no tenderness. There is no rebound and no guarding.  Genitourinary:  Genitourinary Comments: Being followed by GYN  Musculoskeletal:  Normal range of motion.  Lymphadenopathy:    She has no cervical adenopathy.  Neurological: She is alert and oriented to person, place, and time. She displays normal reflexes. No cranial nerve deficit. Coordination normal.  Skin: Skin is warm and dry.  Psychiatric: She has a normal mood and affect.      Assessment & Plan:  1. HSV-2 infection - valACYclovir (VALTREX) 500 MG tablet; Take 1 tablet (500 mg total) by mouth daily.  Dispense: 30 tablet; Refill: 6 - HSV(herpes simplex vrs) 1+2 ab-IgG - long discussion about transmission, prevention. With DM2 and decreased immunity we will start on maintenance therapy.   Future Appointments Date Time Provider Department Center  03/23/2016 8:15 AM Marinus Maw, MD [facility]  11/22/2016 9:00 AM Quentin Mulling, PA-C [id number] None

## 2016-02-17 LAB — HSV(HERPES SIMPLEX VRS) I + II AB-IGG
HSV 1 Glycoprotein G Ab, IgG: 29 Index — ABNORMAL HIGH (ref <0.90)
HSV 2 Glycoprotein G Ab, IgG: >23 Index — ABNORMAL HIGH (ref <0.90)

## 2016-03-09 ENCOUNTER — Ambulatory Visit (INDEPENDENT_AMBULATORY_CARE_PROVIDER_SITE_OTHER): Payer: BLUE CROSS/BLUE SHIELD | Admitting: Podiatry

## 2016-03-09 ENCOUNTER — Encounter: Payer: Self-pay | Admitting: Podiatry

## 2016-03-09 ENCOUNTER — Ambulatory Visit (HOSPITAL_BASED_OUTPATIENT_CLINIC_OR_DEPARTMENT_OTHER)
Admission: RE | Admit: 2016-03-09 | Discharge: 2016-03-09 | Disposition: A | Discharge status: Home / Self Care | Payer: BLUE CROSS/BLUE SHIELD | Source: Ambulatory Visit | Attending: Podiatry | Admitting: Podiatry

## 2016-03-09 DIAGNOSIS — M19072 Primary osteoarthritis, left ankle and foot: Secondary | ICD-10-CM | POA: Insufficient documentation

## 2016-03-09 DIAGNOSIS — M79672 Pain in left foot: Secondary | ICD-10-CM | POA: Diagnosis present

## 2016-03-09 DIAGNOSIS — M84375A Stress fracture, left foot, initial encounter for fracture: Secondary | ICD-10-CM | POA: Diagnosis not present

## 2016-03-09 DIAGNOSIS — M7989 Other specified soft tissue disorders: Secondary | ICD-10-CM | POA: Diagnosis not present

## 2016-03-09 NOTE — Patient Instructions (Signed)
Stress Fracture Stress fracture is a small break or crack in a bone. A stress fracture can be fully broken (complete) or partially broken (incomplete). The most common sites for stress fractures are the bones in the front of your feet (metatarsals), your heels (calcaneus), and the long bone of your lower leg (tibia). What are the causes? A stress fracture is caused by overuse or repetitive exercise, such as running. It happens when a bone cannot absorb any more shock because the muscles around it are weak. Stress fractures happen most commonly when:  You rapidly increase or start a new physical activity.  You use shoes that are worn out or do not fit you properly.  You exercise on a new surface. What increases the risk? You may be at higher risk for this type of fracture if:  You have a condition that causes weak bones (osteoporosis).  You are female. Stress fractures are more likely to occur in women. What are the signs or symptoms? The most common symptom of a stress fracture is feeling pain when you are using the affected part of your body. The pain usually goes away when you are resting. Other symptoms may include:  Swelling of the affected area.  Pain in the area when it is touched.  Decreased pain while resting. Stress fracture pain usually develops over time. How is this diagnosed? Diagnosis may include:  Medical history and physical exam.  X-rays.  Bone scan.  MRI. How is this treated? Treatment depends on the severity of your stress fracture. Treatment usually involves resting, icing, compression, and elevation (RICE) of the affected part of your body. Treatment may also include:  Medicines to reduce inflammation.  A cast or a walking shoe.  Crutches.  Surgery. Follow these instructions at home: If you have a cast:  Do not stick anything inside the cast to scratch your skin. Doing that increases your risk of infection.  Check the skin around the cast every  day. Report any concerns to your health care provider. You may put lotion on dry skin around the edges of the cast. Do not apply lotion to the skin underneath the cast.  Keep the cast clean and dry.  Cover the cast with a watertight plastic bag to protect it from water while you take a bath or a shower. Do not let the cast get wet.  Do not put pressure on any part of the cast until it is fully hardened. This may take several hours. If You Have a Walking Shoe:   Wear it as directed by your health care provider. Managing pain, stiffness, and swelling  If directed, apply ice to the injured area:  Put ice in a plastic bag.  Place a towel between your skin and the bag.  Leave the ice on for 20 minutes, 2-3 times per day.  Move your fingers or toes often to avoid stiffness and to lessen swelling.  Raise the injured area above the level of your heart while you are sitting or lying down. Activity  Rest as directed by your health care provider. Ask your health care provider if you may do alternative exercises, such as swimming or biking, while you are healing.  Return to your normal activities as directed by your health care provider. Ask your health care provider what activities are safe for you.  Perform range-of-motion exercises only as directed by your health care provider. Safety  Do not use the injured limb to support yourbody weight until your health   care provider says that you can. Use crutches if your health care provider tells you to do so. General instructions  Do not use any tobacco products, including cigarettes, chewing tobacco, or electronic cigarettes. Tobacco can delay bone healing. If you need help quitting, ask your health care provider.  Take medicines only as directed by your health care provider.  Keep all follow-up visits as directed by your health care provider. This is important. How is this prevented?  Only wear shoes that:  Fit well.  Are not worn  out.  Eat a healthy diet that contains vitamin D and calcium. This helps keeps your bones strong.  Be careful when you start a new physical activity. Give your body time to adjust.  Avoid doing only one kind of activity. Do different exercises, such as swimming and running, so that no single part of your body gets overused.  Do strength-training exercises. Contact a health care provider if:  Your pain gets worse.  You have new symptoms.  You have increased swelling. Get help right away if:  You lose feeling in the affected area. This information is not intended to replace advice given to you by your health care provider. Make sure you discuss any questions you have with your health care provider. Document Released: 05/15/2002 Document Revised: 10/22/2015 Document Reviewed: 09/27/2013 Elsevier Interactive Patient Education  2017 [employer]  

## 2016-03-09 NOTE — Progress Notes (Signed)
   Subjective:    Patient ID: [name], female    DOB: 08-15-1955, 61 y.o.   MRN: [id number]  HPI  61 year old female presents the office today for concerns of left foot pain which started on Friday. She states that she was going up and down steps all day and was having pain. She woke up Saturday and was unable to put any weight to her foot. The pain has gotten somewhat better but she is concerned that she has a stress fracture. She states that she has been walking differently which is causing other areas of her foot to hurt as well. She states the swelling was much worse of the weekend has improved some. She denies any redness or warmth.She has no other complaints today. She's had no recent treatment for this.  Review of Systems  All other systems reviewed and are negative.      Objective:   Physical Exam General: AAO x3, NAD  Dermatological: Skin is warm, dry and supple bilateral. Nails x 10 are well manicured; remaining integument appears unremarkable at this time. There are no open sores, no preulcerative lesions, no rash or signs of infection present.  Vascular: Dorsalis Pedis artery and Posterior Tibial artery pedal pulses are 2/4 bilateral with immedate capillary fill time.  There is no pain with calf compression, swelling, warmth, erythema.   Neruologic: Grossly intact via light touch bilateral. Vibratory intact via tuning fork bilateral. Protective threshold with Semmes Wienstein monofilament intact to all pedal sites bilateral.  Musculoskeletal: there is tenderness to palpation to the third, fourth metatarsals as well as the distal fibula. There is tenderness with vibratory sensation to the metatarsals are not to the ankle. There is no pain with ankle joint range of motion. There is mild edema to the dorsal aspect left foot. There is no erythema or increase in warmth. There is no other area pinpoint bony tenderness bilaterally. Muscular strength 5/5 in all groups tested  bilateral.  Gait: Unassisted, Nonantalgic.      Assessment & Plan:  61 year old female left foot possible stress fracture -Treatment options discussed including all alternatives, risks, and complications -Etiology of symptoms were discussed -X-rays were obtained and reviewed with the patient.  -Given the nature of her symptoms as well as pain with weightbearing concern for stress fractures well. Immobilization in a cam boot recommended and this was dispensed to her today. Compression anklet was also dispensed. Ice and elevation. Given other medical comorbidities will hold off and oral anti-inflammatories. -Ice and elevation. -Limit activity. -Follow-up in 3 weeks or sooner if any problems arise. In the meantime, encouraged to call the office with any questions, concerns, change in symptoms.   Ovid Curd, DPM

## 2016-03-10 ENCOUNTER — Ambulatory Visit (HOSPITAL_COMMUNITY)
Admission: RE | Admit: 2016-03-10 | Discharge: 2016-03-10 | Disposition: A | Discharge status: Home / Self Care | Payer: BLUE CROSS/BLUE SHIELD | Source: Ambulatory Visit | Attending: Physician Assistant | Admitting: Physician Assistant

## 2016-03-10 ENCOUNTER — Encounter: Payer: Self-pay | Admitting: Physician Assistant

## 2016-03-10 ENCOUNTER — Ambulatory Visit (INDEPENDENT_AMBULATORY_CARE_PROVIDER_SITE_OTHER): Payer: BLUE CROSS/BLUE SHIELD | Admitting: Physician Assistant

## 2016-03-10 VITALS — BP 120/90 | HR 86 | Temp 97.5°F | Resp 16 | Ht 66.0 in | Wt 239.0 lb

## 2016-03-10 DIAGNOSIS — J069 Acute upper respiratory infection, unspecified: Secondary | ICD-10-CM

## 2016-03-10 DIAGNOSIS — I5022 Chronic systolic (congestive) heart failure: Secondary | ICD-10-CM | POA: Diagnosis not present

## 2016-03-10 DIAGNOSIS — R0989 Other specified symptoms and signs involving the circulatory and respiratory systems: Secondary | ICD-10-CM | POA: Diagnosis present

## 2016-03-10 DIAGNOSIS — J449 Chronic obstructive pulmonary disease, unspecified: Secondary | ICD-10-CM | POA: Insufficient documentation

## 2016-03-10 MED ORDER — PROMETHAZINE-CODEINE 6.25-10 MG/5ML PO SYRP: 5.0000 mL | ORAL_SOLUTION | Freq: Four times a day (QID) | ORAL | 0 refills | Status: DC | PRN

## 2016-03-10 MED ORDER — IPRATROPIUM-ALBUTEROL 0.5-2.5 (3) MG/3ML IN SOLN
3.0000 mL | Freq: Once | RESPIRATORY_TRACT | Status: AC
  Administered 2016-03-10: 3 mL via RESPIRATORY_TRACT

## 2016-03-10 MED ORDER — PREDNISONE 20 MG PO TABS: ORAL_TABLET | ORAL | 0 refills | Status: DC

## 2016-03-10 MED ORDER — ALBUTEROL SULFATE HFA 108 (90 BASE) MCG/ACT IN AERS: 2.0000 | INHALATION_SPRAY | RESPIRATORY_TRACT | 0 refills | Status: DC | PRN

## 2016-03-10 NOTE — Progress Notes (Signed)
Subjective:    Patient ID: [name], female    DOB: 10-Jan-1956, 61 y.o.   MRN: [id number]  HPI 61 y.o. obese WF with history of NIDCMP, DM2 presents with cough x 3 days. Sick contacts at work. Started sat/sunday. She is on zyrtec, mucinex, has been on nasal spray over the weekend but has stopped. No fever, chills. She has non productive cough, + orthopnea/PND, no edema, + weight gain.   Blood pressure 120/90, pulse 86, temperature 97.5 F (36.4 C), resp. rate 16, height 5\' 6"  (1.676 m), weight 239 lb (108.4 kg), SpO2 97 %.  Wt Readings from Last 5 Encounters:  03/10/16 239 lb (108.4 kg)  02/13/16 233 lb 12.8 oz (106.1 kg)  11/19/15 229 lb (103.9 kg)  10/14/15 228 lb (103.4 kg)  04/30/15 226 lb 6.4 oz (102.7 kg)    Medications Current Outpatient Prescriptions on File Prior to Visit  Medication Sig  . aspirin 81 MG tablet Take 162 mg by mouth at bedtime.   Marland Kitchen aspirin-acetaminophen-caffeine (EXCEDRIN MIGRAINE) 250-250-65 MG per tablet Take 2 tablets by mouth daily as needed for migraine.  . carvedilol (COREG) 25 MG tablet TAKE 1 TABLET (25 MG TOTAL) BY MOUTH 2 (TWO) TIMES DAILY WITH A MEAL  . diazepam (VALIUM) 5 MG tablet Take 1 tablet (5 mg total) by mouth at bedtime as needed for anxiety. (Patient taking differently: Take 2.5-5 mg by mouth daily as needed for anxiety. )  . estradiol (ESTRACE) 2 MG tablet Take 2 mg by mouth daily.  . fenofibrate micronized (LOFIBRA) 134 MG capsule TAKE 1 CAPSULE BY MOUTH ONCE DAILY BEFORE BREAKFAST  . imipramine (TOFRANIL) 25 MG tablet TAKE 1 TO 2 TABLETS BY MOUTH EVERY DAY  . loratadine (CLARITIN) 10 MG tablet Take 10 mg by mouth daily.   Marland Kitchen losartan (COZAAR) 100 MG tablet Take 0.5 tablets (50 mg total) by mouth daily.  Marland Kitchen NOVOLIN 70/30 RELION (70-30) 100 UNIT/ML injection Inject 75 Units into the skin 2 (two) times daily.  . pantoprazole (PROTONIX) 40 MG tablet Take 40 mg by mouth daily as needed (For acid reflux).   . Potassium 99 MG TABS Take  1 tablet by mouth daily.  . rosuvastatin (CRESTOR) 40 MG tablet Take 1 tablet (40 mg total) by mouth daily.  Marland Kitchen spironolactone (ALDACTONE) 25 MG tablet TAKE 1 TABLET (25 MG TOTAL) BY MOUTH 2 (TWO) TIMES DAILY.  . valACYclovir (VALTREX) 500 MG tablet Take 1 tablet (500 mg total) by mouth daily.  . verapamil (CALAN-SR) 180 MG CR tablet TAKE 1 TABLET(180 MG) BY MOUTH AT BEDTIME   No current facility-administered medications on file prior to visit.     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  Review of Systems  Constitutional: Negative for chills and diaphoresis.  HENT: Positive for congestion, postnasal drip, sinus pressure and sneezing. Negative for ear pain and sore throat.   Respiratory: Positive for cough and shortness of breath. Negative for chest tightness and wheezing.   Cardiovascular: Negative.  Negative for chest pain, palpitations and leg swelling.  Gastrointestinal: Negative.   Genitourinary: Negative.  Musculoskeletal: Negative for neck pain.  Neurological: Positive for headaches.       Objective:   Physical Exam  Constitutional: She is oriented to person, place, and time. She appears well-developed and well-nourished.  HENT:  Head: Normocephalic and atraumatic.  Right Ear: External ear normal.  Left Ear: External ear normal.  Nose: Nose normal.  Mouth/Throat: Oropharynx is clear and moist.  Eyes: Conjunctivae are normal.  Pupils are equal, round, and reactive to light.  Neck: Normal range of motion. Neck supple.  Cardiovascular: Normal rate and regular rhythm.   Pulmonary/Chest: Effort normal. No respiratory distress. She has wheezes. She has no rales. She exhibits no tenderness.  Abdominal: Soft. Bowel sounds are normal.  Musculoskeletal: She exhibits no edema.  Lymphadenopathy:    She has no cervical adenopathy.  Neurological: She is alert and oriented to person, place, and time.  Skin: Skin is warm and dry.      Assessment & Plan:   Chronic systolic congestive heart failure (HCC) Weight is up, + PND/orthopnea, no edema - will get CXR, fluid restrict, decrease salt/sugar, and will take her bumex for 2-3 days and monitor weight at home -     DG Chest 2 View; Future  Upper respiratory tract infection, unspecified type ? Infection versus CHF Get CXR, only 3 days, no need for ABX at this time Will do Symbicort sample -     DG Chest 2 View; Future -     ipratropium-albuterol (DUONEB) 0.5-2.5 (3) MG/3ML nebulizer solution 3 mL; Take 3 mLs by nebulization once. -     promethazine-codeine (PHENERGAN WITH CODEINE) 6.25-10 MG/5ML syrup; Take 5 mLs by mouth every 6 (six) hours as needed for cough. Max: 77mL per day -     predniSONE (DELTASONE) 20 MG tablet; 2 tablets daily for 3 days, 1 tablet daily for 4 days. -     albuterol (VENTOLIN HFA) 108 (90 Base) MCG/ACT inhaler; Inhale 2 puffs into the lungs every 4 (four) hours as needed for wheezing or shortness of breath.

## 2016-03-10 NOTE — Patient Instructions (Addendum)
Can use symbicort sample Can take prendisone if not better see below how Continue nasal spray Do albuterol as needed Get chest xray  Any severe SOB, chest pain go to ER  Make sure you are on an allergy pill, see below for more details. Please take the prednisone as directed below, this is NOT an antibiotic so you do NOT have to finish it. You can take it for a few days and stop it if you are doing better.   Please take the prednisone to help decrease inflammation and therefore decrease symptoms. Take it it with food to avoid GI upset. It can cause increased energy but on the other hand it can make it hard to sleep at night so please take it AT NIGHT WITH DINNER, it takes 8-12 hours to start working so it will NOT affect your sleeping if you take it at night with your food!!  If you are diabetic it will increase your sugars so decrease carbs and monitor your sugars closely.     HOW TO TREAT VIRAL COUGH AND COLD SYMPTOMS:  -Symptoms usually last at least 1 week with the worst symptoms being around day 4.  - colds usually start with a sore throat and end with a cough, and the cough can take 2 weeks to get better.  -No antibiotics are needed for colds, flu, sore throats, cough, bronchitis UNLESS symptoms are longer than 7 days OR if you are getting better then get drastically worse.  -There are a lot of combination medications (Dayquil, Nyquil, Vicks 44, tyelnol cold and sinus, ETC). Please look at the ingredients on the back so that you are treating the correct symptoms and not doubling up on medications/ingredients.    Medicines you can use  Nasal congestion  - pseudoephedrine (Sudafed)- behind the counter, do not use if you have high blood pressure, medicine that have -D in them.  - phenylephrine (Sudafed PE) -Dextormethorphan + chlorpheniramine (Coridcidin HBP)- okay if you have high blood pressure -Oxymetazoline (Afrin) nasal spray- LIMIT to 3 days -Saline nasal spray -Neti pot (used  distilled or bottled water)  Ear pain/congestion  -pseudoephedrine (sudafed) - Nasonex/flonase nasal spray  Fever  -Acetaminophen (Tyelnol) -Ibuprofen (Advil, motrin, aleve)  Sore Throat  -Acetaminophen (Tyelnol) -Ibuprofen (Advil, motrin, aleve) -Drink a lot of water -Gargle with salt water - Rest your voice (don't talk) -Throat sprays -Cough drops  Body Aches  -Acetaminophen (Tyelnol) -Ibuprofen (Advil, motrin, aleve)  Headache  -Acetaminophen (Tyelnol) -Ibuprofen (Advil, motrin, aleve) - Exedrin, Exedrin Migraine  Allergy symptoms (cough, sneeze, runny nose, itchy eyes) -Claritin or loratadine cheapest but likely the weakest  -Zyrtec or certizine at night because it can make you sleepy -The strongest is allegra or fexafinadine  Cheapest at [employer], [employer], [employer]  Cough  -Dextromethorphan (Delsym)- medicine that has DM in it -Guafenesin (Mucinex/Robitussin) - cough drops - drink lots of water  Chest Congestion  -Guafenesin (Mucinex/Robitussin)  Red Itchy Eyes  - Naphcon-A  Upset Stomach  - Bland diet (nothing spicy, greasy, fried, and high acid foods like tomatoes, oranges, berries) -OKAY- cereal, bread, soup, crackers, rice -Eat smaller more frequent meals -reduce caffeine, no alcohol -Loperamide (Imodium-AD) if diarrhea -Prevacid for heart burn  General health when sick  -Hydration -wash your hands frequently -keep surfaces clean -change pillow cases and sheets often -Get fresh air but do not exercise strenuously -Vitamin D, double up on it - Vitamin C -Zinc

## 2016-03-12 NOTE — Telephone Encounter (Signed)
This encounter was created in error - please disregard.

## 2016-03-23 ENCOUNTER — Other Ambulatory Visit: Payer: Self-pay | Admitting: Cardiovascular Disease

## 2016-03-23 ENCOUNTER — Ambulatory Visit (INDEPENDENT_AMBULATORY_CARE_PROVIDER_SITE_OTHER): Payer: BLUE CROSS/BLUE SHIELD | Admitting: Podiatry

## 2016-03-23 ENCOUNTER — Encounter: Payer: Self-pay | Admitting: Podiatry

## 2016-03-23 ENCOUNTER — Encounter: Payer: Self-pay | Admitting: Internal Medicine

## 2016-03-23 ENCOUNTER — Ambulatory Visit (INDEPENDENT_AMBULATORY_CARE_PROVIDER_SITE_OTHER): Payer: BLUE CROSS/BLUE SHIELD | Admitting: Internal Medicine

## 2016-03-23 VITALS — BP 126/68 | HR 73 | Ht 66.0 in | Wt 237.2 lb

## 2016-03-23 DIAGNOSIS — M84375D Stress fracture, left foot, subsequent encounter for fracture with routine healing: Secondary | ICD-10-CM

## 2016-03-23 DIAGNOSIS — I5022 Chronic systolic (congestive) heart failure: Secondary | ICD-10-CM | POA: Diagnosis not present

## 2016-03-23 DIAGNOSIS — M19072 Primary osteoarthritis, left ankle and foot: Secondary | ICD-10-CM

## 2016-03-23 DIAGNOSIS — Z950 Presence of cardiac pacemaker: Secondary | ICD-10-CM | POA: Diagnosis not present

## 2016-03-23 DIAGNOSIS — M779 Enthesopathy, unspecified: Secondary | ICD-10-CM | POA: Diagnosis not present

## 2016-03-23 LAB — CUP PACEART INCLINIC DEVICE CHECK
Brady Statistic RV Percent Paced: 99.87 %
Date Time Interrogation Session: 20180116113048
Implantable Lead Implant Date: 20091016
Implantable Lead Location: 753860
Implantable Lead Model: 7122
Implantable Pulse Generator Implant Date: 20151111
Lead Channel Impedance Value: 400 Ohm
Lead Channel Impedance Value: 462.5 Ohm
Lead Channel Impedance Value: 762.5 Ohm
Lead Channel Pacing Threshold Pulse Width: 0.3 ms
Lead Channel Pacing Threshold Pulse Width: 0.6 ms
Lead Channel Sensing Intrinsic Amplitude: 12 mV
Lead Channel Setting Pacing Amplitude: 2 V
Lead Channel Setting Pacing Amplitude: 2 V
Lead Channel Setting Pacing Pulse Width: 0.6 ms
Lead Channel Setting Sensing Sensitivity: 2 mV
MDC IDC LEAD IMPLANT DT: 20091016
MDC IDC LEAD IMPLANT DT: 20091016
MDC IDC LEAD LOCATION: 753858
MDC IDC LEAD LOCATION: 753859
MDC IDC MSMT BATTERY VOLTAGE: 2.98 V
MDC IDC MSMT LEADCHNL LV PACING THRESHOLD AMPLITUDE: 1 V
MDC IDC MSMT LEADCHNL RA PACING THRESHOLD AMPLITUDE: 0.5 V
MDC IDC MSMT LEADCHNL RA PACING THRESHOLD PULSEWIDTH: 0.3 ms
MDC IDC MSMT LEADCHNL RA SENSING INTR AMPL: 3.5 mV
MDC IDC MSMT LEADCHNL RV PACING THRESHOLD AMPLITUDE: 0.75 V
MDC IDC PG SERIAL: 7688750
MDC IDC SET LEADCHNL RV PACING AMPLITUDE: 2.5 V
MDC IDC SET LEADCHNL RV PACING PULSEWIDTH: 0.3 ms
MDC IDC STAT BRADY RA PERCENT PACED: 3.8 %
Pulse Gen Model: 3222

## 2016-03-23 MED ORDER — NONFORMULARY OR COMPOUNDED ITEM: 2 refills | Status: DC

## 2016-03-23 NOTE — Addendum Note (Signed)
Addended by: Alphia Kava D on: 03/23/2016 05:42 PM   Modules accepted: Orders

## 2016-03-23 NOTE — Patient Instructions (Addendum)
Medication Instructions:    Your physician recommends that you continue on your current medications as directed. Please refer to the Current Medication list given to you today.  --- If you need a refill on your cardiac medications before your next appointment, please call your pharmacy. ---  Labwork:  None ordered  Testing/Procedures: Your physician has requested that you have an echocardiogram in 4-6 weeks. Echocardiography is a painless test that uses sound waves to create images of your heart. It provides your doctor with information about the size and shape of your heart and how well your heart's chambers and valves are working. This procedure takes approximately one hour. There are no restrictions for this procedure.   Follow-Up: Your physician recommends that you schedule a follow-up appointment with Dr. Tresa Endo after your echocardiogram.  Remote monitoring is used to monitor your Pacemaker of ICD from home. This monitoring reduces the number of office visits required to check your device to one time per year. It allows [place] to keep an eye on the functioning of your device to ensure it is working properly. You are scheduled for a device check from home on 06/22/2016. You may send your transmission at any time that day. If you have a wireless device, the transmission will be sent automatically. After your physician reviews your transmission, you will receive a postcard with your next transmission date.   Your physician wants you to follow-up in: 1 year with Dr. Ladona Ridgel.  You will receive a reminder letter in the mail two months in advance. If you don't receive a letter, please call our office to schedule the follow-up appointment.  Thank you for choosing [facility]!!

## 2016-03-23 NOTE — Progress Notes (Signed)
HPI [name] returns today for followup. She is a very pleasant 61 year old woman with a nonischemic cardiomyopathy, chronic systolic heart failure, and left bundle branch block, who underwent biventricular ICD implantation and had normalization of her left ventricular function, s/p downgrade to a BiV PPM. In the interim she has been stable although she does think she has been a little more sob. She broke her foot. It is in a boot.   Current Outpatient Prescriptions  Medication Sig Dispense Refill  . albuterol (VENTOLIN HFA) 108 (90 Base) MCG/ACT inhaler Inhale 2 puffs into the lungs every 4 (four) hours as needed for wheezing or shortness of breath. 1 Inhaler 0  . aspirin 81 MG tablet Take 162 mg by mouth at bedtime.     Marland Kitchen aspirin-acetaminophen-caffeine (EXCEDRIN MIGRAINE) 250-250-65 MG per tablet Take 2 tablets by mouth daily as needed for migraine.    . carvedilol (COREG) 25 MG tablet TAKE 1 TABLET (25 MG TOTAL) BY MOUTH 2 (TWO) TIMES DAILY WITH A MEAL 180 tablet 3  . diazepam (VALIUM) 5 MG tablet Take 1 tablet (5 mg total) by mouth at bedtime as needed for anxiety. (Patient taking differently: Take 2.5-5 mg by mouth daily as needed for anxiety. ) 30 tablet 0  . estradiol (ESTRACE) 2 MG tablet Take 2 mg by mouth daily.    . fenofibrate micronized (LOFIBRA) 134 MG capsule TAKE 1 CAPSULE BY MOUTH ONCE DAILY BEFORE BREAKFAST 90 capsule 1  . imipramine (TOFRANIL) 25 MG tablet TAKE 1 TO 2 TABLETS BY MOUTH EVERY DAY 180 tablet 1  . loratadine (CLARITIN) 10 MG tablet Take 10 mg by mouth daily.     Marland Kitchen losartan (COZAAR) 100 MG tablet Take 0.5 tablets (50 mg total) by mouth daily. 100 tablet 2  . metFORMIN (GLUCOPHAGE) 1000 MG tablet Take 1,000 mg by mouth 2 (two) times daily with a meal.    . NOVOLIN 70/30 RELION (70-30) 100 UNIT/ML injection Inject 75 Units into the skin 2 (two) times daily.  0  . pantoprazole (PROTONIX) 40 MG tablet Take 40 mg by mouth daily as needed (For acid reflux).     .  Potassium 99 MG TABS Take 1 tablet by mouth daily.    . predniSONE (DELTASONE) 20 MG tablet 2 tablets daily for 3 days, 1 tablet daily for 4 days. 10 tablet 0  . promethazine-codeine (PHENERGAN WITH CODEINE) 6.25-10 MG/5ML syrup Take 5 mLs by mouth every 6 (six) hours as needed for cough. Max: 20mL per day 240 mL 0  . rosuvastatin (CRESTOR) 40 MG tablet Take 1 tablet (40 mg total) by mouth daily. 90 tablet 3  . spironolactone (ALDACTONE) 25 MG tablet TAKE 1 TABLET (25 MG TOTAL) BY MOUTH 2 (TWO) TIMES DAILY. 180 tablet 3  . valACYclovir (VALTREX) 500 MG tablet Take 1 tablet (500 mg total) by mouth daily. 30 tablet 6  . verapamil (CALAN-SR) 180 MG CR tablet TAKE 1 TABLET(180 MG) BY MOUTH AT BEDTIME 30 tablet 4   No current facility-administered medications for this visit.      Past Medical History:  Diagnosis Date  . Automatic implantable cardiac defibrillator in situ    ST. JUDE MODEL 7122  . CHF (congestive heart failure) (HCC)   . Diabetes mellitus   . Hyperlipidemia   . Hypertension 06/25/2011   Renal dopplers - superior mesenteric artery >50% diameter reduction; R renal artery - normal patency; L proximal renal artery 1-59% reduction (lower end of scale); both kidneys normal in size/symmetry with  normal cortex and medulla  . Leg pain 12/09/2010   doppler of R femoral artery - no evidence of dissection, AV fistula, pseudoaneurysm or other vascular abnormalities  . Migraines   . Obesity   . Sleep apnea    on CPAP - AHI during sleep study was 44  . Type II or unspecified type diabetes mellitus without mention of complication, not stated as uncontrolled   . Type II or unspecified type diabetes mellitus without mention of complication, not stated as uncontrolled   . Unspecified sleep apnea     ROS:   All systems reviewed and negative except as noted in the HPI.   Past Surgical History:  Procedure Laterality Date  . ABDOMINAL HYSTERECTOMY    . BIV PACEMAKER GENERATOR CHANGE OUT N/A  01/16/2014   Procedure: BIV PACEMAKER GENERATOR CHANGE OUT;  Surgeon: Marinus Maw, MD;  Location: [facility] CATH LAB;  Service: Cardiovascular;  Laterality: N/A;  . CARDIAC CATHETERIZATION  07/15/2006   no significant CAD by cardiac cath, confirmed by intravascular [place] of LAD; non-ischemic cardiomyopathy prob related to uncontrolled hypertension, DM and morbid obesity; moderate pulmonary hypertension; preserved cardiac output and cardiac index  . CARDIAC DEFIBRILLATOR PLACEMENT     ICD by Dr. Ladona Ridgel  . CARDIOVASCULAR STRESS TEST  08/28/2010   R/P MV - pattern of normal perfusion in all regions, no scintigraphic evidence of inducible myocardial ischemia; no EKG changes; normal perfusion study; pt did experience chesst pain during strudy, resolved spontaneously  . DOPPLER ECHOCARDIOGRAPHY  06/25/2011   EF >55%; moderate concentric LV hypertrophy; LA mildly dilated, mild mitral annular calcification;   . KNEE SURGERY    . NECK SURGERY    . PACEMAKER INSERTION       Family History  Problem Relation Age of Onset  . Heart attack Father   . Hearing loss Father   . Hypertension Father   . Cancer Mother      Social History   Social History  . Marital status: Married    Spouse name: N/A  . Number of children: N/A  . Years of education: N/A   Occupational History  . Not on file.   Social History Main Topics  . Smoking status: Former Smoker    Types: Cigarettes    Quit date: 03/09/1975  . Smokeless tobacco: Never Used  . Alcohol use 0.0 oz/week  . Drug use: No  . Sexual activity: Not on file   Other Topics Concern  . 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     BP 126/68   Pulse 73   Ht 5\' 6"  (1.676 m)   Wt 237 lb 3.2 oz (107.6 kg)   SpO2 95%   BMI 38.29 kg/m   Physical Exam:  Well appearing obese, middle-age woman, NAD HEENT: Unremarkable Neck:  7 cm JVD, no thyromegally Back:  No CVA tenderness Lungs:  Scattered basilar rales,  well-healed PPM incision HEART:  Regular rate rhythm, grade 1/6 systolic murmur, no rubs, no clicks Abd:  soft, obese, positive bowel sounds, no organomegally, no rebound, no guarding Ext:  2 plus pulses, no edema, no cyanosis, no clubbing Skin:  No rashes no nodules Neuro:  CN II through XII intact, motor grossly intact  ECG - NSR with biv pacing  DEVICE  Normal device function.  See PaceArt for details.   Assess/Plan: 1. Chronic systolic heart failure - we will plan to repeat her 2D echo as it has been  over 2 years and she thinks her dyspnea is worse. She will followup with Dr. Wonda Cheng.  2. Biv PPM - her St. Jude device is working normally.  3. Obesity - she is limited by her broken foot. When it heals I have encouraged her to start walking. 4. HTN - her blood pressure is reasonably well controlled.   Leonia Reeves.D.

## 2016-03-23 NOTE — Addendum Note (Signed)
Addended by: Baird Lyons on: 03/23/2016 08:44 AM   Modules accepted: Orders

## 2016-03-23 NOTE — Progress Notes (Signed)
Subjective: Ms. Ploski presents to the office today for follow-up evaluation of left foot pain. She states the pain is much improved but she is still having some mild discomfort. She has been in the CAM boot and wearing the compression anklet. She has not had much swelling and this has improved. No changes since last appointment. Denies any systemic complaints such as fevers, chills, nausea, vomiting. No acute changes since last appointment, and no other complaints at this time.   Objective: AAO x3, NAD DP/PT pulses palpable bilaterally, CRT less than 3 seconds There is mild tenderness to palpation over the left foot. There is no pian to the shafts of the 3rd or 4th metatarsal and is mostly along the lateral Lisfranc joint today. No specific area of pinpoint tenderness. There is no significant edema over the areas. No pain to the ankle or with ROM.  No open lesions or pre-ulcerative lesions.  No pain with calf compression, swelling, warmth, erythema  Assessment: Left foot pain, resolving; osteoarthritis/capsulitis   Plan: -All treatment options discussed with the patient including all alternatives, risks, complications.  -Declined repeat x-rays today.  -Can start to transition to the regular shoe as tolerated. If is any increase in pain to return to the cam boot. I ordered anti-inflammatory compound cream today to apply as needed through [employer]. I discussed their shoe changes as well. -Follow up in 3 weeks if symptoms are not resolved or sooner if needed. If symptoms continue next appointment repeat x-rays left foot. -Patient encouraged to call the office with any questions, concerns, change in symptoms.   Ovid Curd, DPM

## 2016-04-13 ENCOUNTER — Ambulatory Visit: Payer: BLUE CROSS/BLUE SHIELD | Admitting: Podiatry

## 2016-04-27 ENCOUNTER — Other Ambulatory Visit: Payer: Self-pay | Admitting: Internal Medicine

## 2016-05-07 ENCOUNTER — Ambulatory Visit (HOSPITAL_COMMUNITY): Payer: BLUE CROSS/BLUE SHIELD | Attending: Cardiovascular Disease

## 2016-05-07 ENCOUNTER — Other Ambulatory Visit: Payer: Self-pay

## 2016-05-07 DIAGNOSIS — I5022 Chronic systolic (congestive) heart failure: Secondary | ICD-10-CM

## 2016-05-07 DIAGNOSIS — E669 Obesity, unspecified: Secondary | ICD-10-CM | POA: Diagnosis not present

## 2016-05-07 DIAGNOSIS — Z6838 Body mass index (BMI) 38.0-38.9, adult: Secondary | ICD-10-CM | POA: Diagnosis not present

## 2016-05-07 DIAGNOSIS — G4730 Sleep apnea, unspecified: Secondary | ICD-10-CM | POA: Diagnosis not present

## 2016-05-07 DIAGNOSIS — I428 Other cardiomyopathies: Secondary | ICD-10-CM | POA: Diagnosis not present

## 2016-05-07 DIAGNOSIS — I110 Hypertensive heart disease with heart failure: Secondary | ICD-10-CM | POA: Insufficient documentation

## 2016-05-07 DIAGNOSIS — Z950 Presence of cardiac pacemaker: Secondary | ICD-10-CM | POA: Diagnosis not present

## 2016-05-07 DIAGNOSIS — E119 Type 2 diabetes mellitus without complications: Secondary | ICD-10-CM | POA: Insufficient documentation

## 2016-05-07 DIAGNOSIS — E785 Hyperlipidemia, unspecified: Secondary | ICD-10-CM | POA: Insufficient documentation

## 2016-05-07 DIAGNOSIS — I313 Pericardial effusion (noninflammatory): Secondary | ICD-10-CM | POA: Insufficient documentation

## 2016-05-13 ENCOUNTER — Ambulatory Visit (INDEPENDENT_AMBULATORY_CARE_PROVIDER_SITE_OTHER): Payer: BLUE CROSS/BLUE SHIELD | Admitting: Physician Assistant

## 2016-05-13 ENCOUNTER — Encounter: Payer: Self-pay | Admitting: Physician Assistant

## 2016-05-13 VITALS — BP 136/74 | HR 96 | Temp 97.3°F | Resp 16 | Ht 66.0 in | Wt 236.2 lb

## 2016-05-13 DIAGNOSIS — Z1159 Encounter for screening for other viral diseases: Secondary | ICD-10-CM

## 2016-05-13 DIAGNOSIS — R21 Rash and other nonspecific skin eruption: Secondary | ICD-10-CM

## 2016-05-13 DIAGNOSIS — R05 Cough: Secondary | ICD-10-CM | POA: Diagnosis not present

## 2016-05-13 DIAGNOSIS — R059 Cough, unspecified: Secondary | ICD-10-CM

## 2016-05-13 LAB — CBC WITH DIFFERENTIAL/PLATELET
BASOS PCT: 0 %
Basophils Absolute: 0 cells/uL (ref 0–200)
EOS ABS: 103 {cells}/uL (ref 15–500)
Eosinophils Relative: 1 %
HEMATOCRIT: 41.7 % (ref 35.0–45.0)
HEMOGLOBIN: 13.8 g/dL (ref 11.7–15.5)
LYMPHS ABS: 2987 {cells}/uL (ref 850–3900)
Lymphocytes Relative: 29 %
MCH: 30.8 pg (ref 27.0–33.0)
MCHC: 33.1 g/dL (ref 32.0–36.0)
MCV: 93.1 fL (ref 80.0–100.0)
MONO ABS: 824 {cells}/uL (ref 200–950)
MPV: 9.6 fL (ref 7.5–12.5)
Monocytes Relative: 8 %
NEUTROS PCT: 62 %
Neutro Abs: 6386 cells/uL (ref 1500–7800)
Platelets: 278 10*3/uL (ref 140–400)
RBC: 4.48 MIL/uL (ref 3.80–5.10)
RDW: 14.2 % (ref 11.0–15.0)
WBC: 10.3 10*3/uL (ref 3.8–10.8)

## 2016-05-13 MED ORDER — AZITHROMYCIN 250 MG PO TABS: ORAL_TABLET | ORAL | 1 refills | Status: AC

## 2016-05-13 MED ORDER — DEXAMETHASONE SODIUM PHOSPHATE 100 MG/10ML IJ SOLN
10.0000 mg | Freq: Once | INTRAMUSCULAR | Status: AC
  Administered 2016-05-13: 10 mg via INTRAMUSCULAR

## 2016-05-13 NOTE — Progress Notes (Signed)
Subjective:    Patient ID: [name], female    DOB: 1955-08-30, 61 y.o.   MRN: [id number]  Cough  Associated symptoms include chills, a fever, postnasal drip, a rash, rhinorrhea and a sore throat. Pertinent negatives include no ear pain, shortness of breath or wheezing.  Fever   Associated symptoms include congestion, coughing, a rash and a sore throat. Pertinent negatives include no ear pain or wheezing.  Rash  Associated symptoms include congestion, coughing, a fever, rhinorrhea and a sore throat. Pertinent negatives include no fatigue or shortness of breath.   61 y.o. obese WF with history of CHF presents with cold symptoms. Started to have sore throat, cough, low grade temp, not feeling well/fatigue. Has taken nyquil with minimal relief. Denies weight gain, edema, PND. Also has macularpapular rash on bilateral legs/ankles, very itchy, thought it was from [facility] rescue [facility] but now on bilateral hands and legs.   Blood pressure 136/74, pulse 96, temperature 97.3 F (36.3 C), resp. rate 16, height 5\' 6"  (1.676 m), weight 236 lb 3.2 oz (107.1 kg), SpO2 96 %.  Wt Readings from Last 6 Encounters:  05/13/16 236 lb 3.2 oz (107.1 kg)  03/23/16 237 lb 3.2 oz (107.6 kg)  03/10/16 239 lb (108.4 kg)  02/13/16 233 lb 12.8 oz (106.1 kg)  11/19/15 229 lb (103.9 kg)  10/14/15 228 lb (103.4 kg)    Current Outpatient Prescriptions on File Prior to Visit  Medication Sig Dispense Refill  . aspirin 81 MG tablet Take 162 mg by mouth at bedtime.     Marland Kitchen aspirin-acetaminophen-caffeine (EXCEDRIN MIGRAINE) 250-250-65 MG per tablet Take 2 tablets by mouth daily as needed for migraine.    . carvedilol (COREG) 25 MG tablet TAKE 1 TABLET (25 MG TOTAL) BY MOUTH 2 (TWO) TIMES DAILY WITH A MEAL 180 tablet 3  . diazepam (VALIUM) 5 MG tablet Take 1 tablet (5 mg total) by mouth at bedtime as needed for anxiety. (Patient taking differently: Take 2.5-5 mg by mouth daily as needed for anxiety. ) 30  tablet 0  . estradiol (ESTRACE) 2 MG tablet Take 2 mg by mouth daily.    . fenofibrate micronized (LOFIBRA) 134 MG capsule TAKE 1 CAPSULE BY MOUTH ONCE DAILY BEFORE BREAKFAST 90 capsule 1  . imipramine (TOFRANIL) 25 MG tablet TAKE 1 TO 2 TABLETS BY MOUTH EVERY DAY 180 tablet 1  . loratadine (CLARITIN) 10 MG tablet Take 10 mg by mouth daily.     Marland Kitchen losartan (COZAAR) 100 MG tablet Take 0.5 tablets (50 mg total) by mouth daily. 100 tablet 2  . metFORMIN (GLUCOPHAGE) 1000 MG tablet Take 1,000 mg by mouth 2 (two) times daily with a meal.    . NOVOLIN 70/30 RELION (70-30) 100 UNIT/ML injection Inject 75 Units into the skin 2 (two) times daily.  0  . pantoprazole (PROTONIX) 40 MG tablet Take 40 mg by mouth daily as needed (For acid reflux).     . Potassium 99 MG TABS Take 1 tablet by mouth daily.    . rosuvastatin (CRESTOR) 40 MG tablet TAKE 1 TABLET(40 MG) BY MOUTH DAILY 90 tablet 1  . spironolactone (ALDACTONE) 25 MG tablet TAKE 1 TABLET (25 MG TOTAL) BY MOUTH 2 (TWO) TIMES DAILY. 180 tablet 3  . valACYclovir (VALTREX) 500 MG tablet Take 1 tablet (500 mg total) by mouth daily. 30 tablet 6  . verapamil (CALAN-SR) 180 MG CR tablet TAKE 1 TABLET(180 MG) BY MOUTH AT BEDTIME 30 tablet 4  No current facility-administered medications on file prior to visit.    Past Medical History:  Diagnosis Date  . Automatic implantable cardiac defibrillator in situ    [employer] 7122  . CHF (congestive heart failure) (HCC)   . Diabetes mellitus   . Hyperlipidemia   . Hypertension 06/25/2011   Renal dopplers - superior mesenteric artery >50% diameter reduction; R renal artery - normal patency; L proximal renal artery 1-59% reduction (lower end of scale); both kidneys normal in size/symmetry with normal cortex and medulla  . Leg pain 12/09/2010   doppler of R femoral artery - no evidence of dissection, AV fistula, pseudoaneurysm or other vascular abnormalities  . Migraines   . Obesity   . Sleep apnea    on CPAP  - AHI during sleep study was 44  . Type II or unspecified type diabetes mellitus without mention of complication, not stated as uncontrolled   . Type II or unspecified type diabetes mellitus without mention of complication, not stated as uncontrolled   . Unspecified sleep apnea     Review of Systems  Constitutional: Positive for chills and fever. Negative for activity change, appetite change, diaphoresis, fatigue and unexpected weight change.  HENT: Positive for congestion, postnasal drip, rhinorrhea, sinus pressure and sore throat. Negative for dental problem, drooling, ear discharge, ear pain, facial swelling, hearing loss, mouth sores, nosebleeds, sneezing, tinnitus, trouble swallowing and voice change.   Eyes: Negative.   Respiratory: Positive for cough. Negative for apnea, choking, chest tightness, shortness of breath, wheezing and stridor.   Cardiovascular: Negative.   Gastrointestinal: Negative.   Genitourinary: Negative.   Musculoskeletal: Negative.   Skin: Positive for rash.  Neurological: Negative.        Objective:   Physical Exam  Constitutional: She appears well-developed and well-nourished.  HENT:  Head: Normocephalic and atraumatic.  Right Ear: External ear normal.  Nose: Right sinus exhibits maxillary sinus tenderness. Right sinus exhibits no frontal sinus tenderness. Left sinus exhibits maxillary sinus tenderness. Left sinus exhibits no frontal sinus tenderness.  Mouth/Throat: Uvula is midline. No trismus in the jaw. Posterior oropharyngeal edema and posterior oropharyngeal erythema present. No oropharyngeal exudate or tonsillar abscesses.  Eyes: Conjunctivae and EOM are normal.  Neck: Normal range of motion. Neck supple.  Cardiovascular: Normal rate, regular rhythm, normal heart sounds and intact distal pulses.   Pulmonary/Chest: Effort normal and breath sounds normal. No respiratory distress. She has no wheezes.  Abdominal: Soft. Bowel sounds are normal.   Lymphadenopathy:    She has no cervical adenopathy.  Skin: Skin is warm and dry. Rash (maculopapular rash clustered on bialteral ankles and sporadic up her leg, and several grouped together bilateral hands. ) noted.       Assessment & Plan:  URI Flonase, claritin, if not better in 7 days then will give ABX Dexamethasone shot Follow up as needed  Rash- Dexamethasone shot ? Check labs, possible viral reactions

## 2016-05-13 NOTE — Patient Instructions (Signed)
HOW TO TREAT VIRAL COUGH AND COLD SYMPTOMS:  -Symptoms usually last at least 1 week with the worst symptoms being around day 4.  - colds usually start with a sore throat and end with a cough, and the cough can take 2 weeks to get better.  -No antibiotics are needed for colds, flu, sore throats, cough, bronchitis UNLESS symptoms are longer than 7 days OR if you are getting better then get drastically worse.  -There are a lot of combination medications (Dayquil, Nyquil, Vicks 44, tyelnol cold and sinus, ETC). Please look at the ingredients on the back so that you are treating the correct symptoms and not doubling up on medications/ingredients.    Medicines you can use  Nasal congestion  - pseudoephedrine (Sudafed)- behind the counter, do not use if you have high blood pressure, medicine that have -D in them.  - phenylephrine (Sudafed PE) -Dextormethorphan + chlorpheniramine (Coridcidin HBP)- okay if you have high blood pressure -Oxymetazoline (Afrin) nasal spray- LIMIT to 3 days -Saline nasal spray -Neti pot (used distilled or bottled water)  Ear pain/congestion  -pseudoephedrine (sudafed) - Nasonex/flonase nasal spray  Fever  -Acetaminophen (Tyelnol) -Ibuprofen (Advil, motrin, aleve)  Sore Throat  -Acetaminophen (Tyelnol) -Ibuprofen (Advil, motrin, aleve) -Drink a lot of water -Gargle with salt water - Rest your voice (don't talk) -Throat sprays -Cough drops  Body Aches  -Acetaminophen (Tyelnol) -Ibuprofen (Advil, motrin, aleve)  Headache  -Acetaminophen (Tyelnol) -Ibuprofen (Advil, motrin, aleve) - Exedrin, Exedrin Migraine  Allergy symptoms (cough, sneeze, runny nose, itchy eyes) -Claritin or loratadine cheapest but likely the weakest  -Zyrtec or certizine at night because it can make you sleepy -The strongest is allegra or fexafinadine  Cheapest at [employer], [employer], [employer]  Cough  -Dextromethorphan (Delsym)- medicine that has DM in it -Guafenesin  (Mucinex/Robitussin) - cough drops - drink lots of water  Chest Congestion  -Guafenesin (Mucinex/Robitussin)  Red Itchy Eyes  - Naphcon-A  Upset Stomach  - Bland diet (nothing spicy, greasy, fried, and high acid foods like tomatoes, oranges, berries) -OKAY- cereal, bread, soup, crackers, rice -Eat smaller more frequent meals -reduce caffeine, no alcohol -Loperamide (Imodium-AD) if diarrhea -Prevacid for heart burn  General health when sick  -Hydration -wash your hands frequently -keep surfaces clean -change pillow cases and sheets often -Get fresh air but do not exercise strenuously -Vitamin D, double up on it - Vitamin C -Zinc       Rash A rash is a change in the color of the skin. A rash can also change the way your skin feels. There are many different conditions and factors that can cause a rash. Follow these instructions at home: Pay attention to any changes in your symptoms. Follow these instructions to help with your condition: Medicine  Take or apply over-the-counter and prescription medicines only as told by your doctor. These may include:  Corticosteroid cream.  Anti-itch lotions.  Oral antihistamines. Skin Care   Put cool compresses on the affected areas.  Try taking a bath with:  Epsom salts. Follow the instructions on the packaging. You can get these at your local pharmacy or grocery store.  Baking soda. Pour a small amount into the bath as told by your doctor.  Colloidal oatmeal. Follow the instructions on the packaging. You can get this at your local pharmacy or grocery store.  Try putting baking soda paste onto your skin. Stir water into baking soda until it gets like a paste.  Do not scratch or rub your skin.  Avoid  covering the rash. Make sure the rash is exposed to air as much as possible. General instructions   Avoid hot showers or baths, which can make itching worse. A cold shower may help.  Avoid scented soaps, detergents, and  perfumes. Use gentle soaps, detergents, perfumes, and other cosmetic products.  Avoid anything that causes your rash. Keep a journal to help track what causes your rash. Write down:  What you eat.  What cosmetic products you use.  What you drink.  What you wear. This includes jewelry.  Keep all follow-up visits as told by your doctor. This is important. Contact a doctor if:  You sweat at night.  You lose weight.  You pee (urinate) more than normal.  You feel weak.  You throw up (vomit).  Your skin or the whites of your eyes look yellow (jaundice).  Your skin:  Tingles.  Is numb.  Your rash:  Does not go away after a few days.  Gets worse.  You are:  More thirsty than normal.  More tired than normal.  You have:  New symptoms.  Pain in your belly (abdomen).  A fever.  Watery poop (diarrhea). Get help right away if:  Your rash covers all or most of your body. The rash may or may not be painful.  You have blisters that:  Are on top of the rash.  Grow larger.  Grow together.  Are painful.  Are inside your nose or mouth.  You have a rash that:  Looks like purple pinprick-sized spots all over your body.  Has a "bull's eye" or looks like a target.  Is red and painful, causes your skin to peel, and is not from being in the sun too long. This information is not intended to replace advice given to you by your health care provider. Make sure you discuss any questions you have with your health care provider. Document Released: 08/11/2007 Document Revised: 07/31/2015 Document Reviewed: 07/10/2014 Elsevier Interactive Patient Education  2017 [employer].

## 2016-05-14 LAB — COMPREHENSIVE METABOLIC PANEL
ALBUMIN: 4 g/dL (ref 3.6–5.1)
ALK PHOS: 57 U/L (ref 33–130)
ALT: 26 U/L (ref 6–29)
AST: 20 U/L (ref 10–35)
BUN: 14 mg/dL (ref 7–25)
CALCIUM: 9.2 mg/dL (ref 8.6–10.4)
CO2: 24 mmol/L (ref 20–31)
Chloride: 101 mmol/L (ref 98–110)
Creat: 1 mg/dL — ABNORMAL HIGH (ref 0.50–0.99)
GLUCOSE: 320 mg/dL — AB (ref 65–99)
POTASSIUM: 4.2 mmol/L (ref 3.5–5.3)
Sodium: 136 mmol/L (ref 135–146)
Total Bilirubin: 0.5 mg/dL (ref 0.2–1.2)
Total Protein: 6.7 g/dL (ref 6.1–8.1)

## 2016-05-14 LAB — HIV ANTIBODY (ROUTINE TESTING W REFLEX): HIV: NONREACTIVE

## 2016-05-14 LAB — RPR

## 2016-05-14 NOTE — Progress Notes (Signed)
Pt aware of lab results & voiced understanding of those results.

## 2016-05-28 ENCOUNTER — Other Ambulatory Visit: Payer: Self-pay | Admitting: Cardiovascular Disease

## 2016-05-28 DIAGNOSIS — I10 Essential (primary) hypertension: Secondary | ICD-10-CM

## 2016-05-28 NOTE — Telephone Encounter (Signed)
REFILL 

## 2016-06-11 ENCOUNTER — Encounter: Payer: Self-pay | Admitting: Cardiovascular Disease

## 2016-06-11 ENCOUNTER — Ambulatory Visit (INDEPENDENT_AMBULATORY_CARE_PROVIDER_SITE_OTHER): Payer: BLUE CROSS/BLUE SHIELD | Admitting: Cardiovascular Disease

## 2016-06-11 VITALS — BP 110/54 | HR 74 | Ht 67.0 in | Wt 237.0 lb

## 2016-06-11 DIAGNOSIS — I5032 Chronic diastolic (congestive) heart failure: Secondary | ICD-10-CM

## 2016-06-11 DIAGNOSIS — IMO0001 Reserved for inherently not codable concepts without codable children: Secondary | ICD-10-CM

## 2016-06-11 DIAGNOSIS — E6609 Other obesity due to excess calories: Secondary | ICD-10-CM

## 2016-06-11 DIAGNOSIS — I428 Other cardiomyopathies: Secondary | ICD-10-CM | POA: Diagnosis not present

## 2016-06-11 DIAGNOSIS — E785 Hyperlipidemia, unspecified: Secondary | ICD-10-CM | POA: Diagnosis not present

## 2016-06-11 DIAGNOSIS — E1165 Type 2 diabetes mellitus with hyperglycemia: Secondary | ICD-10-CM

## 2016-06-11 DIAGNOSIS — I10 Essential (primary) hypertension: Secondary | ICD-10-CM

## 2016-06-11 DIAGNOSIS — Z794 Long term (current) use of insulin: Secondary | ICD-10-CM | POA: Diagnosis not present

## 2016-06-11 DIAGNOSIS — Z6837 Body mass index (BMI) 37.0-37.9, adult: Secondary | ICD-10-CM | POA: Diagnosis not present

## 2016-06-11 DIAGNOSIS — Z9989 Dependence on other enabling machines and devices: Secondary | ICD-10-CM | POA: Diagnosis not present

## 2016-06-11 DIAGNOSIS — G4733 Obstructive sleep apnea (adult) (pediatric): Secondary | ICD-10-CM | POA: Diagnosis not present

## 2016-06-11 NOTE — Patient Instructions (Signed)
Your physician wants you to follow-up in: 1 year or sooner if needed. You will receive a reminder letter in the mail two months in advance. If you don't receive a letter, please call our office to schedule the follow-up appointment.   If you need a refill on your cardiac medications before your next appointment, please call your pharmacy.   

## 2016-06-11 NOTE — Progress Notes (Signed)
Patient ID: [name], female   DOB: 17-Jun-1955, 61 y.o.   MRN: [id number]      HPI: [name] is a 61 y.o. female who presents to the office for an 41 month cardiology evaluation.  [name] has a history of a nonischemic cardiomyopathy. Initial ejection fraction was in the range of 25-30%. She is status post biventricular ICD implantation by Dr. Lovena Le. She also has a history of obstructive sleep apnea on CPAP therapy admits to being 100% compliant with usage. On her last  echo in April 2013 her ejection fraction has essentially normalized and was felt to be greater than 55%.  She did have diastolic dysfunction. There is evidence for mild aortic valve sclerosis. 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  She underwent a pacemaker generator change out by Dr. Lovena Le on 01/16/2014 and with her normalization of LV function, she had a downgrade from an ICD to a by bi-v permanent pacemaker.  She saw Dr. Lovena Le in follow-up in February 2016 at which time she had mild worsening of her heart failure symptoms with a change from class I.  The class IIb felt predominantly due to dietary and medical noncompliance.  She was advised to take torsemide twice a day until her weight and volume overload improved  Since I last saw her almost 2 years ago, she has been under stress.  She works in [employer], mining with the bank and her [employer] have been bought out and there is potential threat of loss of job.  In addition, she has been caring for aging parents.  Over the past year.  She admits to at least a 15 pound weight gain.  Her weight had dropped to a low of 220 pounds.  In January 2018, she saw Dr. Lovena Le at that time admitted to some increasing shortness of breath with exertion.  On 05/07/2016.  She had a follow-up echo Doppler study which showed an EF of 55-60%.  She did not have  regional wall motion abnormalities.  However, there was grade 2 diastolic dysfunction.  ECG at that time showed normal sinus rhythm with biventricular pacing.  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  She is now on Valtrex with improvement in symptomatology.  She will be having endocrinologic reassessment with Dr. York Spaniel and laboratory will be drawn at that time.  I reviewed recent laboratory done in September 2017 and more recently on 05/13/2016.  She continues to use CPAP with 100% compliance.  Her DME company is [employer].  She currently denies any sleep issues except her recent stress, sometimes affecting her ability to fall asleep.  She presents for evaluation.   Past Medical History:  Diagnosis Date  . Automatic implantable cardiac defibrillator in [place]  . CHF (congestive heart failure) ([place])   . Diabetes mellitus   . Hyperlipidemia   . Hypertension 06/25/2011   Renal dopplers - superior mesenteric artery >50% diameter reduction; R renal artery - normal patency; L proximal renal artery 1-59% reduction (lower end of scale); both kidneys normal in size/symmetry with normal cortex and medulla  . Leg pain 12/09/2010   doppler of R femoral artery - no evidence of dissection, AV fistula, pseudoaneurysm or other vascular abnormalities  . Migraines   . Obesity   . Sleep apnea  on CPAP - AHI during sleep study was 44  . Type II or unspecified type diabetes mellitus without mention of complication, not stated as uncontrolled   . Type II or unspecified type diabetes mellitus without mention of complication, not stated as uncontrolled   . Unspecified sleep apnea     Past Surgical History:  Procedure Laterality Date  . ABDOMINAL HYSTERECTOMY    . BIV PACEMAKER GENERATOR CHANGE OUT N/A 01/16/2014   Procedure: BIV PACEMAKER GENERATOR CHANGE OUT;  Surgeon: Evans Lance, MD;  Location: [facility] CATH LAB;  Service:  Cardiovascular;  Laterality: N/A;  . CARDIAC CATHETERIZATION  07/15/2006   no significant CAD by cardiac cath, confirmed by intravascular [place] of LAD; non-ischemic cardiomyopathy prob related to uncontrolled hypertension, DM and morbid obesity; moderate pulmonary hypertension; preserved cardiac output and cardiac index  . CARDIAC DEFIBRILLATOR PLACEMENT     ICD by Dr. Lovena Le  . CARDIOVASCULAR STRESS TEST  08/28/2010   R/P MV - pattern of normal perfusion in all regions, no scintigraphic evidence of inducible myocardial ischemia; no EKG changes; normal perfusion study; pt did experience chesst pain during strudy, resolved spontaneously  . DOPPLER ECHOCARDIOGRAPHY  06/25/2011   EF >55%; moderate concentric LV hypertrophy; LA mildly dilated, mild mitral annular calcification;   . KNEE SURGERY    . NECK SURGERY    . PACEMAKER INSERTION        Current Outpatient Prescriptions  Medication Sig Dispense Refill  . aspirin 81 MG tablet Take 162 mg by mouth at bedtime.     Marland Kitchen aspirin-acetaminophen-caffeine (EXCEDRIN MIGRAINE) 250-250-65 MG per tablet Take 2 tablets by mouth daily as needed for migraine.    . carvedilol (COREG) 25 MG tablet TAKE 1 TABLET (25 MG TOTAL) BY MOUTH 2 (TWO) TIMES DAILY WITH A MEAL 180 tablet 3  . diazepam (VALIUM) 5 MG tablet Take 1 tablet (5 mg total) by mouth at bedtime as needed for anxiety. (Patient taking differently: Take 2.5-5 mg by mouth daily as needed for anxiety. ) 30 tablet 0  . estradiol (ESTRACE) 1 MG tablet Take 1.5 tablets by mouth daily.  4  . fenofibrate micronized (LOFIBRA) 134 MG capsule TAKE 1 CAPSULE BY MOUTH ONCE DAILY BEFORE BREAKFAST 90 capsule 1  . imipramine (TOFRANIL) 25 MG tablet TAKE 1 TO 2 TABLETS BY MOUTH EVERY DAY 180 tablet 1  . loratadine (CLARITIN) 10 MG tablet Take 10 mg by mouth daily.     Marland Kitchen losartan (COZAAR) 100 MG tablet Take 0.5 tablets (50 mg total) by mouth daily. 100 tablet 2  . metFORMIN (GLUCOPHAGE) 1000 MG tablet Take 1,000 mg by  mouth 2 (two) times daily with a meal.    . NOVOLIN 70/30 RELION (70-30) 100 UNIT/ML injection Inject 70 Units into the skin 2 (two) times daily.   0  . pantoprazole (PROTONIX) 40 MG tablet Take 40 mg by mouth daily as needed (For acid reflux).     . Potassium 99 MG TABS Take 1 tablet by mouth daily.    . rosuvastatin (CRESTOR) 40 MG tablet TAKE 1 TABLET(40 MG) BY MOUTH DAILY 90 tablet 1  . spironolactone (ALDACTONE) 25 MG tablet TAKE 1 TABLET (25 MG TOTAL) BY MOUTH 2 (TWO) TIMES DAILY. 180 tablet 3  . valACYclovir (VALTREX) 500 MG tablet Take 1 tablet (500 mg total) by mouth daily. 30 tablet 6  . verapamil (CALAN-SR) 180 MG CR tablet TAKE 1 TABLET(180 MG) BY MOUTH AT BEDTIME 30 tablet 11   No current facility-administered medications  for this visit.     Socially she is married and has 2 children and 2 grandchildren. There is no tobacco or alcohol use.  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 no daytime sleepiness, hypersomnolence, bruxism, restless legs, hypnogognic hallucinations, no cataplexy Other comprehensive 14 point system review is negative.  PE BP (!) 110/54   Pulse  74   Ht '5\' 7"'  (1.702 m)   Wt 237 lb (107.5 kg)   BMI 37.12 kg/m    Wt Readings from Last 3 Encounters:  06/11/16 237 lb (107.5 kg)  05/13/16 236 lb 3.2 oz (107.1 kg)  03/23/16 237 lb 3.2 oz (107.6 kg)   General: Alert, oriented, no distress.  Skin: normal turgor, no rashes HEENT: Normocephalic, atraumatic. Pupils round and reactive; sclera anicteric;no lid lag.  Nose without nasal septal hypertrophy Mouth/Parynx benign; Mallinpatti scale 2/3 Neck: No JVD, no carotid briuts; normal carotid upstroke Lungs: clear to ausculatation and percussion; no wheezing or rales Chest wall: no tenderness to palpation Heart: RRR, s1 s2 normal 1/6 systolic murmur.  No diastolic murmur.  No rubs thrills or heaves. Abdomen: soft, nontender with central adiposity; no hepatosplenomehaly, BS+; abdominal aorta nontender and not dilated by palpation. Back: No CVA tenderness Pulses 2+ Extremities: no clubbing cyanosis or edema, Homan's sign negative .  Firm gastrocnemius muscles. Neurologic: grossly nonfocal; grossly normal cranial nerves. Psychologic: Normal affect and mood  I have personally reviewed the ECG from 03/23/2016.  She had biventricular pacing.  Ventricular rate was 72 bpm.  ECG (independently read by me): Atrially sensed rhythm and 100% ventricular pacing    January 2015 ECG (interpreted by me) : Atrial lead sensed, ventricular paced rhythm with 100% ventricular capture    LABS: BMP Latest Ref Rng & Units 05/13/2016 11/19/2015 12/23/2014  Glucose 65 - 99 mg/dL 320(H) 86 368(H)  BUN 7 - 25 mg/dL '14 16 16  ' Creatinine 0.50 - 0.99 mg/dL 1.00(H) 0.89 1.06(H)  Sodium 135 - 146 mmol/L 136 140 137  Potassium 3.5 - 5.3 mmol/L 4.2 3.8 4.7  Chloride 98 - 110 mmol/L 101 105 99(L)  CO2 20 - 31 mmol/L '24 27 28  ' Calcium 8.6 - 10.4 mg/dL 9.2 9.7 9.2   Hepatic Function Latest Ref Rng & Units 05/13/2016 11/19/2015 12/22/2014  Total Protein 6.1 - 8.1 g/dL 6.7 7.5 6.4  Albumin 3.6 - 5.1 g/dL 4.0 4.6 3.7    AST 10 - 35 U/L '20 27 20  ' ALT 6 - 29 U/L 26 32(H) 27  Alk Phosphatase 33 - 130 U/L 57 51 51  Total Bilirubin 0.2 - 1.2 mg/dL 0.5 0.6 0.6  Bilirubin, Direct <=0.2 mg/dL - 0.1 -   CBC Latest Ref Rng & Units 05/13/2016 11/19/2015 12/23/2014  WBC 3.8 - 10.8 K/uL 10.3 8.8 23.3(H)  Hemoglobin 11.7 - 15.5 g/dL 13.8 15.7(H) 14.0  Hematocrit 35.0 - 45.0 % 41.7 46.3(H)  41.4  Platelets 140 - 400 K/uL 278 290 273   Lab Results  Component Value Date   TSH 1.84 11/19/2015   Lipid Panel     Component Value Date/Time   CHOL 100 (L) 11/19/2015 0954   TRIG 133 11/19/2015 0954   HDL 29 (L) 11/19/2015 0954   CHOLHDL 3.4 11/19/2015 0954   VLDL 27 11/19/2015 0954   LDLCALC 44 11/19/2015 0954     RADIOLOGY: No results found.  IMPRESSION:  1. Essential hypertension   2. Nonischemic cardiomyopathy ([place])   3. OSA on CPAP   4. Chronic diastolic heart failure ([place])   5. Uncontrolled type 2 diabetes mellitus with hyperglycemia, with long-term current use of insulin ([facility])   6. Class 2 obesity due to excess calories with serious comorbidity and body mass index (BMI) of 37.0 to 37.9 in adult   7. Hyperlipidemia with target LDL less than 70     ASSESSMENT AND PLAN: Ms. Gunnels is a 61 year old female with a previous history of a nonischemic cardiomyopathy with subsequent normalization of LV function following biventricular ICD implantation and medical therapy.  In November 2015, she underwent a generator change and downgrade to a by the permanent pacemaker rather than ICD.  She had issues with not taking her diuretic and dietary indiscretion leading to some class II heart failure symptoms.  Since I last saw her over 2 years ago, she had lost weight initially, down to 220 pounds, but over the past 12-18 months has gained a proximally 17 pounds.  She is not exercising.  She has been under increased stress both work-related issues and threat of potentially losing her job as well as aging parents.  In addition,  she recently had a reactivation of the herpes infection from over 40 years ago, which has stabilized and resolved on Valtrex initiation.  With reference to her blood pressure is well controlled.  At present she is not having overt CHF, but has exertional dyspnea which may be related to her grade 2 diastolic dysfunction as well as reduced aerobic capacity.  She continues to be on carvedilol 25 kg twice a day, losartan 50 mg daily, spironolactone 25 mg twice a day as well as verapamil 180 mg at bedtime.  Her blood pressure is stable.  She has a history of hyperlipidemia and is on rosuvastatin 40 mg.  Her most recent lipid studies from September 2017 were excellent with a total cholesterol of 100, triglycerides 133, and LDL of 44.  However, HDL cholesterol was low at 29.  She has had issues with glucose intolerance and states that her hemoglobin A1c had risen at one point up to 10 and in September 2017 was 7.5.  She has obstructive sleep apnea and admits to 100% compliance.  She is not having any issues with breakthrough snoring.  [facility] is her DME company and her supplies are up-to-date.  She is moderately obese with a BMI of 37.12.  Weight loss was recommended.  We discussed the importance of exercising at least 5 days per week if at all possible.  I will see her in one year for reevaluation or sooner if problems arise.   Troy Sine, MD, [facility]  06/11/2016 8:43 AM

## 2016-06-22 ENCOUNTER — Encounter: Payer: BLUE CROSS/BLUE SHIELD | Admitting: *Deleted

## 2016-06-22 ENCOUNTER — Telehealth: Payer: Self-pay | Admitting: Cardiology

## 2016-06-22 NOTE — Telephone Encounter (Signed)
Spoke with pt and reminded pt of remote transmission that is due today. Pt verbalized understanding.   

## 2016-06-25 ENCOUNTER — Encounter: Payer: Self-pay | Admitting: Cardiology

## 2016-07-12 ENCOUNTER — Ambulatory Visit (INDEPENDENT_AMBULATORY_CARE_PROVIDER_SITE_OTHER): Payer: BLUE CROSS/BLUE SHIELD | Admitting: *Deleted

## 2016-07-12 DIAGNOSIS — I428 Other cardiomyopathies: Secondary | ICD-10-CM

## 2016-07-16 ENCOUNTER — Encounter: Payer: Self-pay | Admitting: Cardiology

## 2016-07-16 NOTE — Progress Notes (Signed)
Remote pacemaker transmission.   

## 2016-07-19 LAB — CUP PACEART REMOTE DEVICE CHECK
Battery Remaining Longevity: 103 mo
Brady Statistic AP VS Percent: 1 %
Brady Statistic AS VP Percent: 97 %
Brady Statistic AS VS Percent: 1 %
Date Time Interrogation Session: 20180506203922
Implantable Lead Implant Date: 20091016
Implantable Lead Implant Date: 20091016
Implantable Lead Location: 753858
Implantable Lead Location: 753860
Implantable Pulse Generator Implant Date: 20151111
Lead Channel Impedance Value: 410 Ohm
Lead Channel Impedance Value: 810 Ohm
Lead Channel Pacing Threshold Amplitude: 0.75 V
Lead Channel Pacing Threshold Amplitude: 1 V
Lead Channel Pacing Threshold Pulse Width: 0.6 ms
Lead Channel Sensing Intrinsic Amplitude: 12 mV
Lead Channel Setting Pacing Amplitude: 2 V
Lead Channel Setting Pacing Pulse Width: 0.3 ms
Lead Channel Setting Sensing Sensitivity: 2 mV
MDC IDC LEAD IMPLANT DT: 20091016
MDC IDC LEAD LOCATION: 753859
MDC IDC MSMT BATTERY REMAINING PERCENTAGE: 95.5 %
MDC IDC MSMT BATTERY VOLTAGE: 2.98 V
MDC IDC MSMT LEADCHNL RA PACING THRESHOLD AMPLITUDE: 0.5 V
MDC IDC MSMT LEADCHNL RA PACING THRESHOLD PULSEWIDTH: 0.3 ms
MDC IDC MSMT LEADCHNL RA SENSING INTR AMPL: 4.2 mV
MDC IDC MSMT LEADCHNL RV IMPEDANCE VALUE: 490 Ohm
MDC IDC MSMT LEADCHNL RV PACING THRESHOLD PULSEWIDTH: 0.3 ms
MDC IDC PG SERIAL: 7688750
MDC IDC SET LEADCHNL LV PACING AMPLITUDE: 2 V
MDC IDC SET LEADCHNL LV PACING PULSEWIDTH: 0.6 ms
MDC IDC SET LEADCHNL RV PACING AMPLITUDE: 2.5 V
MDC IDC STAT BRADY AP VP PERCENT: 3.2 %
MDC IDC STAT BRADY RA PERCENT PACED: 3.3 %

## 2016-07-28 ENCOUNTER — Other Ambulatory Visit: Payer: Self-pay | Admitting: Physician Assistant

## 2016-07-28 DIAGNOSIS — R1011 Right upper quadrant pain: Secondary | ICD-10-CM

## 2016-07-30 ENCOUNTER — Ambulatory Visit
Admission: RE | Admit: 2016-07-30 | Discharge: 2016-07-30 | Disposition: A | Discharge status: Home / Self Care | Payer: BLUE CROSS/BLUE SHIELD | Source: Ambulatory Visit | Attending: Physician Assistant | Admitting: Physician Assistant

## 2016-07-30 DIAGNOSIS — R1011 Right upper quadrant pain: Secondary | ICD-10-CM

## 2016-08-09 ENCOUNTER — Other Ambulatory Visit: Payer: BLUE CROSS/BLUE SHIELD

## 2016-08-13 ENCOUNTER — Ambulatory Visit: Payer: Self-pay | Admitting: Physician Assistant

## 2016-09-10 ENCOUNTER — Other Ambulatory Visit: Payer: Self-pay | Admitting: Internal Medicine

## 2016-09-13 ENCOUNTER — Other Ambulatory Visit: Payer: Self-pay | Admitting: Physician Assistant

## 2016-09-13 DIAGNOSIS — B009 Herpesviral infection, unspecified: Secondary | ICD-10-CM

## 2016-10-14 ENCOUNTER — Telehealth: Payer: Self-pay | Admitting: Cardiology

## 2016-10-14 ENCOUNTER — Encounter: Payer: BLUE CROSS/BLUE SHIELD | Admitting: *Deleted

## 2016-10-14 NOTE — Telephone Encounter (Signed)
LMOVM reminding pt to send remote transmission.   

## 2016-10-25 NOTE — Progress Notes (Signed)
Skin Tag Removal Procedure Note  Pre-operative Diagnosis: Classic skin tags (acrochordon) irritated, inflammed getting caught on clothing  Post-operative Diagnosis: Classic skin tags (acrochordon)  Locations:bilateral axilla and back  Indications:  Patient complains of skin tags that are recurrently irritated.   Anesthesia: Lidocaine 1% without epinephrine  Procedure Details  The risks (including bleeding and infection) and benefits of the procedure and Verbal informed consent obtained. Using sterile iris scissors, multiple skin tags were snipped off at their bases after cleansing with Alcohol.  Bleeding was controlled with electrocautery.   Findings: Pathognomonic benign lesions  not sent for pathological exam.  Condition: Stable  Complications: none.  Plan: 1. Instructed to keep the wounds dry and covered for 24-48h and clean thereafter. 2. Warning signs of infection were reviewed.   3. Recommended that the patient use OTC acetaminophen as needed for pain.  4. Return as needed.   BMI is Body mass index is 35.4 kg/m., she is working on diet and exercise.Doing [place] program. States sugars are running low, has had to cut back and doing well with weight loss.  Wt Readings from Last 3 Encounters:  10/27/16 226 lb (102.5 kg)  06/11/16 237 lb (107.5 kg)  05/13/16 236 lb 3.2 oz (107.1 kg)

## 2016-10-27 ENCOUNTER — Ambulatory Visit (INDEPENDENT_AMBULATORY_CARE_PROVIDER_SITE_OTHER): Payer: BLUE CROSS/BLUE SHIELD | Admitting: Physician Assistant

## 2016-10-27 ENCOUNTER — Encounter: Payer: Self-pay | Admitting: Physician Assistant

## 2016-10-27 ENCOUNTER — Encounter: Payer: Self-pay | Admitting: Cardiology

## 2016-10-27 VITALS — BP 118/60 | HR 82 | Temp 98.1°F | Resp 14 | Ht 67.0 in | Wt 226.0 lb

## 2016-10-27 DIAGNOSIS — L918 Other hypertrophic disorders of the skin: Secondary | ICD-10-CM

## 2016-10-27 NOTE — Patient Instructions (Signed)
Skin Tag, Adult A skin tag (acrochordon) is a soft, extra growth of skin. Most skin tags are flesh-colored and rarely bigger than a pencil eraser. They commonly form near areas where there are folds in the skin, such as the armpit or groin. Skin tags are not dangerous, and they do not spread from person to person (are not contagious). You may have one skin tag or several. Skin tags do not require treatment. However, your health care provider may recommend removal of a skin tag if it:  Gets irritated from clothing.  Bleeds.  Is visible and unsightly.  Your health care provider can remove skin tags with a simple surgical procedure or a procedure that involves freezing the skin tag. Follow these instructions at home:  Watch for any changes in your skin tag. A normal skin tag does not require any other special care at home.  Take over-the-counter and prescription medicines only as told by your health care provider.  Keep all follow-up visits as told by your health care provider. This is important. Contact a health care provider if:  You have a skin tag that: ? Becomes painful. ? Changes color. ? Bleeds. ? Swells.  You develop more skin tags. This information is not intended to replace advice given to you by your health care provider. Make sure you discuss any questions you have with your health care provider. Document Released: 03/09/2015 Document Revised: 10/19/2015 Document Reviewed: 03/09/2015 Elsevier Interactive Patient Education  2018 [employer]  

## 2016-11-18 NOTE — Progress Notes (Signed)
Complete Physical  Assessment and Plan:  Essential hypertension - continue medications, DASH diet, exercise and monitor at home. Call if greater than 130/80.  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 Talmage Nap  Diabetic autonomic neuropathy associated with type 2 diabetes mellitus (HCC) -     Hemoglobin A1c -- continue weight loss - suggest cutting back on insulin 3-5 units both AM and PM dose due to low sugars - discussed low sugars and awareness, will schedule follow up Dr. Talmage Nap  Uncontrolled type 2 diabetes mellitus with hyperglycemia, with long-term current use of insulin (HCC) -     Hemoglobin A1c - continue weight loss - suggest cutting back on insulin 3-5 units both AM and PM dose due to low sugars - discussed low sugars and awareness, will schedule follow up Dr. Talmage Nap  HYPERCHOLESTEROLEMIA -     Lipid panel -continue medications, check lipids, decrease fatty foods, increase activity.   Morbid Obesity with co morbidities - long discussion about weight loss, diet, and exercise  Other migraine without status migrainosus, not intractable Avoid triggers  Gastroesophageal reflux disease, esophagitis presence not specified Continue PPI/H2 blocker, diet discussed  Diastolic  dysfunction Continue weight loss  Biventricular cardiac pacemaker in situ Continue cardio follow up  Generalized anxiety disorder Continue meds  Needs flu shot -     FLU VACCINE MDCK QUAD W/Preservative  Medication management -     Magnesium  Vitamin D deficiency -     VITAMIN D 25 Hydroxy (Vit-D Deficiency, Fractures)  Anemia, unspecified type -     Iron,Total/Total Iron Binding Cap -     Vitamin B12   Discussed med's effects and SE's. Screening labs and tests as requested with regular follow-up as recommended.  HPI 61 y.o. female  presents for a complete physical. Her blood pressure has been controlled at home, today their BP is BP: 110/68 She does not workout regularly, pulled something in her back so has not last few days.  She denies chest pain, shortness of breath, dizziness.  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 Tresa Endo and follows with them regularly. She is on BB, ARB, spirolactone, and torsemide.  She is on cholesterol medication, crestor 40mg  and fenofibrate and denies myalgias. Her cholesterol is at goal. The cholesterol last visit was:   Lab Results  Component Value Date   CHOL 100 (L) 11/19/2015   HDL 29 (L) 11/19/2015   LDLCALC 44 11/19/2015   TRIG 133 11/19/2015   CHOLHDL 3.4 11/19/2015   She has been working on diet and exercise for Diabetes with diabetic chronic kidney disease, with other circulatory complications and with  diabetic polyneuropathy. She follows up with Dr. Talmage Nap for her uncontrolled DM, she is on insulin, 70/30 doing 40-35 BID (down from 70's BID), has awaken in the night due to low sugars, does have awareness.  She see's Dr. Hazle Quant for eye exams, she is on bASA, she is on ACE/ARB, and denies hypoglycemia , polydipsia, polyuria and visual disturbances. Does have paresthesias in bilateral feet/legs.   Last A1C was:  Lab Results  Component Value Date   HGBA1C 7.5 (H)  11/19/2015  Patient is on Vitamin D supplement.  Lab Results  Component Value Date   VD25OH 38 11/19/2015  Has history of migraines.  She follows with Dr. Nicholas Lose, has had several seb cyst around breast.  Mobid obesity with noncompliance- BMI is Body mass index is 35.07 kg/m., she is working on diet and exercise. She has OSA and is on CPAP  Wt Readings from Last 3 Encounters:  11/22/16 220 lb 9.6 oz (100.1 kg)  10/27/16 226 lb (102.5 kg)  06/11/16 237 lb (107.5 kg)   Current Medications:  Current Outpatient Prescriptions on File Prior to Visit  Medication Sig Dispense Refill  . aspirin 81 MG tablet Take 162 mg by mouth at bedtime.     Marland Kitchen aspirin-acetaminophen-caffeine (EXCEDRIN MIGRAINE) 250-250-65 MG per tablet Take 2 tablets by mouth daily as needed for migraine.    . carvedilol (COREG) 25 MG tablet TAKE 1 TABLET (25 MG TOTAL) BY MOUTH 2 (TWO) TIMES DAILY WITH A MEAL 180 tablet 3  . diazepam (VALIUM) 5 MG tablet Take 1 tablet (5 mg total) by mouth at bedtime as needed for anxiety. (Patient taking differently: Take 2.5-5 mg by mouth daily as needed for anxiety. ) 30 tablet 0  . estradiol (ESTRACE) 1 MG tablet Take 1.5 tablets by mouth daily.  4  . fenofibrate micronized (LOFIBRA) 134 MG capsule TAKE 1 CAPSULE BY MOUTH ONCE DAILY BEFORE BREAKFAST 90 capsule 1  . imipramine (TOFRANIL) 25 MG tablet TAKE 1 TO 2 TABLETS BY MOUTH EVERY DAY 180 tablet 1  . loratadine (CLARITIN) 10 MG tablet Take 10 mg by mouth daily.     Marland Kitchen losartan (COZAAR) 100 MG tablet Take 0.5 tablets (50 mg total) by mouth daily. 100 tablet 2  . metFORMIN (GLUCOPHAGE) 1000 MG tablet Take 1,000 mg by mouth 2 (two) times daily with a meal.    . NOVOLIN 70/30 RELION (70-30) 100 UNIT/ML injection Inject 70 Units into the skin 2 (two) times daily.   0  . pantoprazole (PROTONIX) 40 MG tablet Take 40 mg by mouth daily as needed (For acid reflux).     . Potassium 99 MG TABS Take 1 tablet by mouth daily.    . rosuvastatin (CRESTOR)  40 MG tablet TAKE 1 TABLET(40 MG) BY MOUTH DAILY 90 tablet 3  . spironolactone (ALDACTONE) 25 MG tablet TAKE 1 TABLET (25 MG TOTAL) BY MOUTH 2 (TWO) TIMES DAILY. 180 tablet 3  . valACYclovir (VALTREX) 500 MG tablet TAKE 1 TABLET(500 MG) BY MOUTH DAILY 90 tablet 0  . verapamil (CALAN-SR) 180 MG CR tablet TAKE 1 TABLET(180 MG) BY MOUTH AT BEDTIME 30 tablet 11   No current facility-administered medications on file prior to visit.    Health Maintenance:   Immunization History  Administered Date(s) Administered  . Influenza Inj Mdck Quad With Preservative 11/22/2016  . Influenza-Unspecified 11/22/2014   Tetanus: Patient states has had had within last 10 years, declines Pneumovax: declines Prevnar 56: due age 62 Flu vaccine: 2018 TODAY Zostavax:  N/A  Pap: Sept 2017 Dr. Chestine Spore  Normal, getting every 3 years MGM: sept 2018 has done at [facility] DEXA: 2006 t -1.3 DUE, declines at this time Colonoscopy: 11/09/2016  EGD: Dec 2007 Stress test 2016 Echo 05/2016 US soft tissues: 08/2013 CT AB 2010  Patient Care Team: Lucky Cowboy, MD as PCP - General (Internal Medicine) Lennette Bihari, MD as Consulting Physician (Cardiology) Fletcher Anon, MD as Consulting Physician (Pediatrics) Bernette Redbird, MD as Consulting Physician (Gastroenterology) Dorisann Frames, MD as Consulting Physician (Endocrinology) Miguel Aschoff, MD as Consulting Physician (Obstetrics and Gynecology) Venancio Poisson, MD as Consulting Physician (Dermatology)   Medical History:  Past Medical History:  Diagnosis Date  . Automatic implantable cardiac defibrillator in situ    [employer] 7122  . CHF (congestive heart failure) (HCC)   . Diabetes mellitus   . Hyperlipidemia   . Hypertension 06/25/2011   Renal dopplers - superior mesenteric artery >50% diameter reduction; R renal artery - normal patency; L proximal renal artery 1-59% reduction (lower end of scale); both kidneys normal in size/symmetry with normal cortex and  medulla  . Leg pain 12/09/2010   doppler of R femoral artery - no evidence of dissection, AV fistula, pseudoaneurysm or other vascular abnormalities  . Migraines   . Obesity   . Sleep apnea    on CPAP - AHI during sleep study was 44  . Type II or unspecified type diabetes mellitus without mention of complication, not stated as uncontrolled   . Type II or unspecified type diabetes mellitus without mention of complication, not stated as uncontrolled   . Unspecified sleep apnea    Allergies Allergies  Allergen Reactions  . Flagyl [Metronidazole] Anaphylaxis  . Niacin And Related Other (See Comments) and Cough    flushing  . Penicillins Swelling      . Yeast-Related Products Swelling and Rash    SURGICAL HISTORY She  has a past surgical history that includes Abdominal hysterectomy; Neck surgery; Pacemaker insertion; Cardiac defibrillator placement; Knee surgery; doppler echocardiography (06/25/2011); Cardiovascular stress test (08/28/2010); Cardiac catheterization (07/15/2006); and Biv pacemaker generator change out (N/A, 01/16/2014). FAMILY HISTORY Her family history includes Cancer in her mother; Hearing loss in her father; Heart attack in her father; Hypertension in her father. SOCIAL HISTORY She  reports that she quit smoking about 41 years ago. Her smoking use included Cigarettes. She has never used smokeless tobacco. She reports that she drinks alcohol. She reports that she does not use drugs.  Review of Systems  Constitutional: Negative.   HENT: Negative.   Respiratory: Negative for cough, hemoptysis, sputum production, shortness of breath and wheezing.   Cardiovascular: Negative.   Gastrointestinal: Negative for abdominal pain, blood in stool, constipation, diarrhea, heartburn, melena, nausea and vomiting.  Genitourinary: Negative.   Musculoskeletal: Negative for back pain, falls, joint pain, myalgias and neck pain.  Skin: Negative for itching and rash.  Neurological: Negative  for dizziness, tingling, tremors, sensory change, speech change, focal weakness, seizures and loss of consciousness.  Psychiatric/Behavioral: Negative.     Physical Exam: Estimated body mass index is 35.07 kg/m as calculated from the following:   Height as of this encounter: 5' 6.5" (1.689 m).   Weight as of this encounter: 220 lb 9.6 oz (100.1 kg). BP 110/68   Pulse 72   Temp 97.7 F (36.5 C)   Resp 16   Ht 5' 6.5" (1.689 m)   Wt 220 lb 9.6 oz (100.1 kg)   SpO2 98%  BMI 35.07 kg/m  General Appearance: Well nourished, in no apparent distress. Eyes: PERRLA, EOMs, conjunctiva no swelling or erythema, normal fundi and vessels. Sinuses: No Frontal/maxillary tenderness ENT/Mouth: Ext aud canals clear, normal light reflex with TMs without erythema, bulging.  Good dentition. No erythema, swelling, or exudate on post pharynx. Tonsils not swollen or erythematous. Hearing normal. + TMJ tenderness Neck: Supple, thyroid normal. No bruits Respiratory: Respiratory effort normal, BS equal bilaterally without rales, rhonchi, wheezing or stridor. Cardio: RRR without murmurs, rubs or gallops. Brisk peripheral pulses without edema, with bilateral hard palpable cords without erythema, warmth, tenderness.  Chest: symmetric, with normal excursions and percussion. Breasts: defer Abdomen: obese, soft, nontender without organomegaly.   Lymphatics: Non tender without lymphadenopathy.  Genitourinary: defer Musculoskeletal: Full ROM all peripheral extremities,5/5 strength, and normal gait. Skin: Warm, dry without rashes, lesions, ecchymosis.  Neuro: Cranial nerves intact, reflexes equal bilaterally. Normal muscle tone, no cerebellar symptoms. Sensation decreased bilateral feet to 2/3 up shin.  Psych: Awake and oriented X 3, normal affect, Insight and Judgment appropriate.   Quentin Mulling 9:16 AM

## 2016-11-22 ENCOUNTER — Encounter: Payer: Self-pay | Admitting: Physician Assistant

## 2016-11-22 ENCOUNTER — Ambulatory Visit (INDEPENDENT_AMBULATORY_CARE_PROVIDER_SITE_OTHER): Payer: BLUE CROSS/BLUE SHIELD | Admitting: Physician Assistant

## 2016-11-22 ENCOUNTER — Other Ambulatory Visit: Payer: Self-pay | Admitting: Internal Medicine

## 2016-11-22 VITALS — BP 110/68 | HR 72 | Temp 97.7°F | Resp 16 | Ht 66.5 in | Wt 220.6 lb

## 2016-11-22 DIAGNOSIS — E1365 Other specified diabetes mellitus with hyperglycemia: Secondary | ICD-10-CM

## 2016-11-22 DIAGNOSIS — G4733 Obstructive sleep apnea (adult) (pediatric): Secondary | ICD-10-CM

## 2016-11-22 DIAGNOSIS — E1165 Type 2 diabetes mellitus with hyperglycemia: Secondary | ICD-10-CM

## 2016-11-22 DIAGNOSIS — G43809 Other migraine, not intractable, without status migrainosus: Secondary | ICD-10-CM

## 2016-11-22 DIAGNOSIS — E1322 Other specified diabetes mellitus with diabetic chronic kidney disease: Secondary | ICD-10-CM

## 2016-11-22 DIAGNOSIS — Z794 Long term (current) use of insulin: Secondary | ICD-10-CM

## 2016-11-22 DIAGNOSIS — Z79899 Other long term (current) drug therapy: Secondary | ICD-10-CM

## 2016-11-22 DIAGNOSIS — I10 Essential (primary) hypertension: Secondary | ICD-10-CM | POA: Diagnosis not present

## 2016-11-22 DIAGNOSIS — Z9989 Dependence on other enabling machines and devices: Secondary | ICD-10-CM

## 2016-11-22 DIAGNOSIS — E559 Vitamin D deficiency, unspecified: Secondary | ICD-10-CM

## 2016-11-22 DIAGNOSIS — N182 Chronic kidney disease, stage 2 (mild): Secondary | ICD-10-CM

## 2016-11-22 DIAGNOSIS — IMO0002 Reserved for concepts with insufficient information to code with codable children: Secondary | ICD-10-CM

## 2016-11-22 DIAGNOSIS — K219 Gastro-esophageal reflux disease without esophagitis: Secondary | ICD-10-CM

## 2016-11-22 DIAGNOSIS — I428 Other cardiomyopathies: Secondary | ICD-10-CM

## 2016-11-22 DIAGNOSIS — E7800 Pure hypercholesterolemia, unspecified: Secondary | ICD-10-CM

## 2016-11-22 DIAGNOSIS — Z0000 Encounter for general adult medical examination without abnormal findings: Secondary | ICD-10-CM

## 2016-11-22 DIAGNOSIS — Z23 Encounter for immunization: Secondary | ICD-10-CM | POA: Diagnosis not present

## 2016-11-22 DIAGNOSIS — D649 Anemia, unspecified: Secondary | ICD-10-CM

## 2016-11-22 DIAGNOSIS — I5189 Other ill-defined heart diseases: Secondary | ICD-10-CM

## 2016-11-22 DIAGNOSIS — F411 Generalized anxiety disorder: Secondary | ICD-10-CM

## 2016-11-22 DIAGNOSIS — E1143 Type 2 diabetes mellitus with diabetic autonomic (poly)neuropathy: Secondary | ICD-10-CM

## 2016-11-22 DIAGNOSIS — I5022 Chronic systolic (congestive) heart failure: Secondary | ICD-10-CM

## 2016-11-22 DIAGNOSIS — Z950 Presence of cardiac pacemaker: Secondary | ICD-10-CM

## 2016-11-22 NOTE — Patient Instructions (Addendum)
Try the exercises and other information in the back care manual, meloxicam once during the day as needed (avoid taking other NSAIDS like Alleve or Ibuprofen while taking this) and then flexeril if needed at bedtime for muscle spasm. This can be taken up to every 8 hours, but causes sedation, so should not drive or operate heavy machinery while taking this medicine.   Go to the ER if you have any new weakness in your legs, have trouble controlling your urine or bowels, or have worsening pain.   If you are not better in 1-3 month we will refer you to ortho   Back pain Rehab Ask your health care provider which exercises are safe for you. Do exercises exactly as told by your health care provider and adjust them as directed. It is normal to feel mild stretching, pulling, tightness, or discomfort as you do these exercises, but you should stop right away if you feel sudden pain or your pain gets worse.Do not begin these exercises until told by your health care provider. Stretching and range of motion exercises These exercises warm up your muscles and joints and improve the movement and flexibility of your hips and your back. These exercises also help to relieve pain, numbness, and tingling. Exercise A: Sciatic nerve glide 1. Sit in a chair with your head facing down toward your chest. Place your hands behind your back. Let your shoulders slump forward. 2. Slowly straighten one of your knees while you tilt your head back as if you are looking toward the ceiling. Only straighten your leg as far as you can without making your symptoms worse. 3. Hold for __________ seconds. 4. Slowly return to the starting position. 5. Repeat with your other leg. Repeat __________ times. Complete this exercise __________ times a day. Exercise B: Knee to chest with hip adduction and internal rotation  1. Lie on your back on a firm surface with both legs straight. 2. Bend one of your knees and move it up toward your chest  until you feel a gentle stretch in your lower back and buttock. Then, move your knee toward the shoulder that is on the opposite side from your leg. ? Hold your leg in this position by holding onto the front of your knee. 3. Hold for __________ seconds. 4. Slowly return to the starting position. 5. Repeat with your other leg. Repeat __________ times. Complete this exercise __________ times a day. Exercise C: Prone extension on elbows  1. Lie on your abdomen on a firm surface. A bed may be too soft for this exercise. 2. Prop yourself up on your elbows. 3. Use your arms to help lift your chest up until you feel a gentle stretch in your abdomen and your lower back. ? This will place some of your body weight on your elbows. If this is uncomfortable, try stacking pillows under your chest. ? Your hips should stay down, against the surface that you are lying on. Keep your hip and back muscles relaxed. 4. Hold for __________ seconds. 5. Slowly relax your upper body and return to the starting position. Repeat __________ times. Complete this exercise __________ times a day. Strengthening exercises These exercises build strength and endurance in your back. Endurance is the ability to use your muscles for a long time, even after they get tired. Exercise D: Pelvic tilt 1. Lie on your back on a firm surface. Bend your knees and keep your feet flat. 2. Tense your abdominal muscles. Tip your pelvis up  toward the ceiling and flatten your lower back into the floor. ? To help with this exercise, you may place a small towel under your lower back and try to push your back into the towel. 3. Hold for __________ seconds. 4. Let your muscles relax completely before you repeat this exercise. Repeat __________ times. Complete this exercise __________ times a day. Exercise E: Alternating arm and leg raises  1. Get on your hands and knees on a firm surface. If you are on a hard floor, you may want to use padding to  cushion your knees, such as an exercise mat. 2. Line up your arms and legs. Your hands should be below your shoulders, and your knees should be below your hips. 3. Lift your left leg behind you. At the same time, raise your right arm and straighten it in front of you. ? Do not lift your leg higher than your hip. ? Do not lift your arm higher than your shoulder. ? Keep your abdominal and back muscles tight. ? Keep your hips facing the ground. ? Do not arch your back. ? Keep your balance carefully, and do not hold your breath. 4. Hold for __________ seconds. 5. Slowly return to the starting position and repeat with your right leg and your left arm. Repeat __________ times. Complete this exercise __________ times a day. Posture and body mechanics  Body mechanics refers to the movements and positions of your body while you do your daily activities. Posture is part of body mechanics. Good posture and healthy body mechanics can help to relieve stress in your body's tissues and joints. Good posture means that your spine is in its natural S-curve position (your spine is neutral), your shoulders are pulled back slightly, and your head is not tipped forward. The following are general guidelines for applying improved posture and body mechanics to your everyday activities. Standing   When standing, keep your spine neutral and your feet about hip-width apart. Keep a slight bend in your knees. Your ears, shoulders, and hips should line up.  When you do a task in which you stand in one place for a long time, place one foot up on a stable object that is 2-4 inches (5-10 cm) high, such as a footstool. This helps keep your spine neutral. Sitting   When sitting, keep your spine neutral and keep your feet flat on the floor. Use a footrest, if necessary, and keep your thighs parallel to the floor. Avoid rounding your shoulders, and avoid tilting your head forward.  When working at a desk or a computer, keep  your desk at a height where your hands are slightly lower than your elbows. Slide your chair under your desk so you are close enough to maintain good posture.  When working at a computer, place your monitor at a height where you are looking straight ahead and you do not have to tilt your head forward or downward to look at the screen. Resting   When lying down and resting, avoid positions that are most painful for you.  If you have pain with activities such as sitting, bending, stooping, or squatting (flexion-based activities), lie in a position in which your body does not bend very much. For example, avoid curling up on your side with your arms and knees near your chest (fetal position).  If you have pain with activities such as standing for a long time or reaching with your arms (extension-based activities), lie with your spine in a neutral  position and bend your knees slightly. Try the following positions: ? Lying on your side with a pillow between your knees. ? Lying on your back with a pillow under your knees. Lifting   When lifting objects, keep your feet at least shoulder-width apart and tighten your abdominal muscles.  Bend your knees and hips and keep your spine neutral. It is important to lift using the strength of your legs, not your back. Do not lock your knees straight out.  Always ask for help to lift heavy or awkward objects. This information is not intended to replace advice given to you by your health care provider. Make sure you discuss any questions you have with your health care provider. Document Released: 02/22/2005 Document Revised: 10/30/2015 Document Reviewed: 11/08/2014 Elsevier Interactive Patient Education  2018 [place] BONE DENSITY   Osteoporosis Osteoporosis is the thinning and loss of density in the bones. Osteoporosis makes the bones more brittle, fragile, and likely to break (fracture). Over time, osteoporosis can cause the  bones to become so weak that they fracture after a simple fall. The bones most likely to fracture are the bones in the hip, wrist, and spine. What are the causes? The exact cause is not known. What increases the risk? Anyone can develop osteoporosis. You may be at greater risk if you have a family history of the condition or have poor nutrition. You may also have a higher risk if you are:  Female.  77 years old or older.  A smoker.  Not physically active.  White or Asian.  Slender.  What are the signs or symptoms? A fracture might be the first sign of the disease, especially if it results from a fall or injury that would not usually cause a bone to break. Other signs and symptoms include:  Low back and neck pain.  Stooped posture.  Height loss.  How is this diagnosed? To make a diagnosis, your health care provider may:  Take a medical history.  Perform a physical exam.  Order tests, such as: ? A bone mineral density test. ? A dual-energy X-ray absorptiometry test.  How is this treated? The goal of osteoporosis treatment is to strengthen your bones to reduce your risk of a fracture. Treatment may involve:  Making lifestyle changes, such as: ? Eating a diet rich in calcium. ? Doing weight-bearing and muscle-strengthening exercises. ? Stopping tobacco use. ? Limiting alcohol intake.  Taking medicine to slow the process of bone loss or to increase bone density.  Monitoring your levels of calcium and vitamin D.  Follow these instructions at home:  Include calcium and vitamin D in your diet. Calcium is important for bone health, and vitamin D helps the body absorb calcium.  Perform weight-bearing and muscle-strengthening exercises as directed by your health care provider.  Do not use any tobacco products, including cigarettes, chewing tobacco, and electronic cigarettes. If you need help quitting, ask your health care provider.  Limit your alcohol intake.  Take  medicines only as directed by your health care provider.  Keep all follow-up visits as directed by your health care provider. This is important.  Take precautions at home to lower your risk of falling, such as: ? Keeping rooms well lit and clutter free. ? Installing safety rails on stairs. ? Using rubber mats in the bathroom and other areas that are often wet or slippery. Get help right away if: You fall or injure yourself. This information is not intended to  replace advice given to you by your health care provider. Make sure you discuss any questions you have with your health care provider. Document Released: 12/02/2004 Document Revised: 07/28/2015 Document Reviewed: 08/02/2013 Elsevier Interactive Patient Education  2017 [place].   Hypoglycemia Hypoglycemia occurs when the level of sugar (glucose) in the blood is too low. Glucose is a type of sugar that provides the body's main source of energy. Certain hormones (insulin and glucagon) control the level of glucose in the blood. Insulin lowers blood glucose, and glucagon increases blood glucose. Hypoglycemia can result from having too much insulin in the bloodstream, or from not eating enough food that contains glucose. Hypoglycemia can happen in people who do or do not have diabetes. It can develop quickly, and it can be a medical emergency. What are the causes? Hypoglycemia occurs most often in people who have diabetes. If you have diabetes, hypoglycemia may be caused by:  Diabetes medicine.  Not eating enough, or not eating often enough.  Increased physical activity.  Drinking alcohol, especially when you have not eaten recently.  If you do not have diabetes, hypoglycemia may be caused by:  A tumor in the pancreas. The pancreas is the organ that makes insulin.  Not eating enough, or not eating for long periods at a time (fasting).  Severe infection or illness that affects the liver, heart, or kidneys.  Certain  medicines.  You may also have reactive hypoglycemia. This condition causes hypoglycemia within 4 hours of eating a meal. This may occur after having stomach surgery. Sometimes, the cause of reactive hypoglycemia is not known. What increases the risk? Hypoglycemia is more likely to develop in:  People who have diabetes and take medicines to lower blood glucose.  People who abuse alcohol.  People who have a severe illness.  What are the signs or symptoms? Hypoglycemia may not cause any symptoms. If you have symptoms, they may include:  Hunger.  Anxiety.  Sweating and feeling clammy.  Confusion.  Dizziness or feeling light-headed.  Sleepiness.  Nausea.  Increased heart rate.  Headache.  Blurry vision.  Seizure.  Nightmares.  Tingling or numbness around the mouth, lips, or tongue.  A change in speech.  Decreased ability to concentrate.  A change in coordination.  Restless sleep.  Tremors or shakes.  Fainting.  Irritability.  How is this diagnosed? Hypoglycemia is diagnosed with a blood test to measure your blood glucose level. This blood test is done while you are having symptoms. Your health care provider may also do a physical exam and review your medical history. If you do not have diabetes, other tests may be done to find the cause of your hypoglycemia. How is this treated? This condition can often be treated by immediately eating or drinking something that contains glucose, such as:  3-4 sugar tablets (glucose pills).  Glucose gel, 15-gram tube.  Fruit juice, 4 oz (120 mL).  Regular soda (not diet soda), 4 oz (120 mL).  Low-fat milk, 4 oz (120 mL).  Several pieces of hard candy.  Sugar or honey, 1 Tbsp.  Treating Hypoglycemia If You Have Diabetes  If you are alert and able to swallow safely, follow the 15:15 rule:  Take 15 grams of a rapid-acting carbohydrate. Rapid-acting options include: ? 1 tube of glucose gel. ? 3 glucose  pills. ? 6-8 pieces of hard candy. ? 4 oz (120 mL) of fruit juice. ? 4 oz (120 ml) of regular (not diet) soda.  Check your blood glucose 15  minutes after you take the carbohydrate.  If the repeat blood glucose level is still at or below 70 mg/dL (3.9 mmol/L), take 15 grams of a carbohydrate again.  If your blood glucose level does not increase above 70 mg/dL (3.9 mmol/L) after 3 tries, seek emergency medical care.  After your blood glucose level returns to normal, eat a meal or a snack within 1 hour.  Treating Severe Hypoglycemia Severe hypoglycemia is when your blood glucose level is at or below 54 mg/dL (3 mmol/L). Severe hypoglycemia is an emergency. Do not wait to see if the symptoms will go away. Get medical help right away. Call your local emergency services (911 in the [place] Do not drive yourself to the hospital. If you have severe hypoglycemia and you cannot eat or drink, you may need an injection of glucagon. A family member or close friend should learn how to check your blood glucose and how to give you a glucagon injection. Ask your health care provider if you need to have an emergency glucagon injection kit available. Severe hypoglycemia may need to be treated in a hospital. The treatment may include getting glucose through an IV tube. You may also need treatment for the cause of your hypoglycemia. Follow these instructions at home: General instructions  Avoid any diets that cause you to not eat enough food. Talk with your health care provider before you start any new diet.  Take over-the-counter and prescription medicines only as told by your health care provider.  Limit alcohol intake to no more than 1 drink per day for nonpregnant women and 2 drinks per day for men. One drink equals 12 oz of beer, 5 oz of wine, or 1 oz of hard liquor.  Keep all follow-up visits as told by your health care provider. This is important. If You Have Diabetes:   Make sure you know the  symptoms of hypoglycemia.  Always have a rapid-acting carbohydrate snack with you to treat low blood sugar.  Follow your diabetes management plan, as told by your health care provider. Make sure you: ? Take your medicines as directed. ? Follow your exercise plan. ? Follow your meal plan. Eat on time, and do not skip meals. ? Check your blood glucose as often as directed. Make sure to check your blood glucose before and after exercise. If you exercise longer or in a different way than usual, check your blood glucose more often. ? Follow your sick day plan whenever you cannot eat or drink normally. Make this plan in advance with your health care provider.  Share your diabetes management plan with people in your workplace, school, and household.  Check your urine for ketones when you are ill and as told by your health care provider.  Carry a medical alert card or wear medical alert jewelry. If You Have Reactive Hypoglycemia or Low Blood Sugar From Other Causes:  Monitor your blood glucose as told by your health care provider.  Follow instructions from your health care provider about eating or drinking restrictions. Contact a health care provider if:  You have problems keeping your blood glucose in your target range.  You have frequent episodes of hypoglycemia. Get help right away if:  You continue to have hypoglycemia symptoms after eating or drinking something containing glucose.  Your blood glucose is at or below 54 mg/dL (3 mmol/L).  You have a seizure.  You faint. These symptoms may represent a serious problem that is an emergency. Do not wait to see  if the symptoms will go away. Get medical help right away. Call your local emergency services (911 in the [place] Do not drive yourself to the hospital. This information is not intended to replace advice given to you by your health care provider. Make sure you discuss any questions you have with your health care provider. Document  Released: 02/22/2005 Document Revised: 08/06/2015 Document Reviewed: 03/28/2015 Elsevier Interactive Patient Education  [employer].

## 2016-11-23 LAB — BASIC METABOLIC PANEL WITH GFR
BUN: 17 mg/dL (ref 7–25)
CHLORIDE: 105 mmol/L (ref 98–110)
CO2: 27 mmol/L (ref 20–32)
Calcium: 10.3 mg/dL (ref 8.6–10.4)
Creat: 0.85 mg/dL (ref 0.50–0.99)
GFR, EST NON AFRICAN AMERICAN: 74 mL/min/{1.73_m2} (ref >=60)
GFR, Est African American: 86 mL/min/{1.73_m2} (ref >=60)
Glucose, Bld: 82 mg/dL (ref 65–99)
POTASSIUM: 4.6 mmol/L (ref 3.5–5.3)
Sodium: 141 mmol/L (ref 135–146)

## 2016-11-23 LAB — CBC WITH DIFFERENTIAL/PLATELET
BASOS ABS: 77 {cells}/uL (ref 0–200)
Basophils Relative: 1.1 %
EOS ABS: 217 {cells}/uL (ref 15–500)
Eosinophils Relative: 3.1 %
HCT: 41.3 % (ref 35.0–45.0)
Hemoglobin: 14 g/dL (ref 11.7–15.5)
Lymphs Abs: 2716 cells/uL (ref 850–3900)
MCH: 31 pg (ref 27.0–33.0)
MCHC: 33.9 g/dL (ref 32.0–36.0)
MCV: 91.4 fL (ref 80.0–100.0)
MONOS PCT: 6.3 %
MPV: 10.1 fL (ref 7.5–12.5)
NEUTROS PCT: 50.7 %
Neutro Abs: 3549 cells/uL (ref 1500–7800)
PLATELETS: 299 10*3/uL (ref 140–400)
RBC: 4.52 10*6/uL (ref 3.80–5.10)
RDW: 14 % (ref 11.0–15.0)
TOTAL LYMPHOCYTE: 38.8 %
WBC: 7 10*3/uL (ref 3.8–10.8)
WBCMIX: 441 {cells}/uL (ref 200–950)

## 2016-11-23 LAB — HEMOGLOBIN A1C
Hgb A1c MFr Bld: 7.2 % of total Hgb — ABNORMAL HIGH (ref <5.7)
Mean Plasma Glucose: 160 (calc)
eAG (mmol/L): 8.9 (calc)

## 2016-11-23 LAB — URINALYSIS W MICROSCOPIC + REFLEX CULTURE
BACTERIA UA: NONE SEEN /HPF
Bilirubin Urine: NEGATIVE
Glucose, UA: NEGATIVE
HGB URINE DIPSTICK: NEGATIVE
HYALINE CAST: NONE SEEN /LPF
Ketones, ur: NEGATIVE
Leukocyte Esterase: NEGATIVE
Nitrites, Initial: NEGATIVE
Protein, ur: NEGATIVE
RBC / HPF: NONE SEEN /HPF (ref 0–2)
SPECIFIC GRAVITY, URINE: 1.007 (ref 1.001–1.03)
Squamous Epithelial / LPF: NONE SEEN /HPF (ref <=5)
WBC UA: NONE SEEN /HPF (ref 0–5)
pH: 7.5 (ref 5.0–8.0)

## 2016-11-23 LAB — HEPATIC FUNCTION PANEL
AG Ratio: 1.7 (calc) (ref 1.0–2.5)
ALBUMIN MSPROF: 4.6 g/dL (ref 3.6–5.1)
ALT: 26 U/L (ref 6–29)
AST: 25 U/L (ref 10–35)
Alkaline phosphatase (APISO): 41 U/L (ref 33–130)
BILIRUBIN TOTAL: 0.5 mg/dL (ref 0.2–1.2)
Bilirubin, Direct: 0.1 mg/dL (ref 0.0–0.2)
GLOBULIN: 2.7 g/dL (ref 1.9–3.7)
Indirect Bilirubin: 0.4 mg/dL (calc) (ref 0.2–1.2)
Total Protein: 7.3 g/dL (ref 6.1–8.1)

## 2016-11-23 LAB — IRON, TOTAL/TOTAL IRON BINDING CAP
%SAT: 16 % (ref 11–50)
IRON: 70 ug/dL (ref 45–160)
TIBC: 451 mcg/dL (calc) — ABNORMAL HIGH (ref 250–450)

## 2016-11-23 LAB — LIPID PANEL
CHOL/HDL RATIO: 2.9 (calc) (ref <5.0)
CHOLESTEROL: 87 mg/dL (ref <200)
HDL: 30 mg/dL — ABNORMAL LOW (ref >50)
LDL CHOLESTEROL (CALC): 37 mg/dL
Non-HDL Cholesterol (Calc): 57 mg/dL (calc) (ref <130)
TRIGLYCERIDES: 113 mg/dL (ref <150)

## 2016-11-23 LAB — MICROALBUMIN / CREATININE URINE RATIO
Creatinine, Urine: 26 mg/dL (ref 20–275)
MICROALB UR: 0.2 mg/dL
MICROALB/CREAT RATIO: 8 ug/mg{creat} (ref <30)

## 2016-11-23 LAB — VITAMIN D 25 HYDROXY (VIT D DEFICIENCY, FRACTURES): VIT D 25 HYDROXY: 55 ng/mL (ref 30–100)

## 2016-11-23 LAB — NO CULTURE INDICATED

## 2016-11-23 LAB — VITAMIN B12: Vitamin B-12: 314 pg/mL (ref 200–1100)

## 2016-11-23 LAB — TSH: TSH: 1.65 m[IU]/L (ref 0.40–4.50)

## 2016-11-23 LAB — MAGNESIUM: Magnesium: 1.7 mg/dL (ref 1.5–2.5)

## 2016-12-01 ENCOUNTER — Ambulatory Visit (INDEPENDENT_AMBULATORY_CARE_PROVIDER_SITE_OTHER): Payer: BLUE CROSS/BLUE SHIELD | Admitting: Physician Assistant

## 2016-12-01 ENCOUNTER — Encounter: Payer: Self-pay | Admitting: Physician Assistant

## 2016-12-01 ENCOUNTER — Ambulatory Visit (HOSPITAL_COMMUNITY)
Admission: RE | Admit: 2016-12-01 | Discharge: 2016-12-01 | Disposition: A | Discharge status: Home / Self Care | Payer: BLUE CROSS/BLUE SHIELD | Source: Ambulatory Visit | Attending: Physician Assistant | Admitting: Physician Assistant

## 2016-12-01 VITALS — BP 118/66 | HR 60 | Temp 97.3°F | Resp 16 | Ht 66.5 in | Wt 215.6 lb

## 2016-12-01 DIAGNOSIS — R310 Gross hematuria: Secondary | ICD-10-CM

## 2016-12-01 DIAGNOSIS — R319 Hematuria, unspecified: Secondary | ICD-10-CM | POA: Insufficient documentation

## 2016-12-01 DIAGNOSIS — R110 Nausea: Secondary | ICD-10-CM | POA: Insufficient documentation

## 2016-12-01 DIAGNOSIS — R1031 Right lower quadrant pain: Secondary | ICD-10-CM | POA: Diagnosis present

## 2016-12-01 MED ORDER — SULFAMETHOXAZOLE-TRIMETHOPRIM 800-160 MG PO TABS: 1.0000 | ORAL_TABLET | Freq: Two times a day (BID) | ORAL | 0 refills | Status: DC

## 2016-12-01 NOTE — Patient Instructions (Signed)
Go get Xray May need CT scan if not better Go to ER if severe pain, unable to hold down food, etc.    Hematuria, Adult Hematuria is blood in your urine. It can be caused by a bladder infection, kidney infection, prostate infection, kidney stone, or cancer of your urinary tract. Infections can usually be treated with medicine, and a kidney stone usually will pass through your urine. If neither of these is the cause of your hematuria, further workup to find out the reason may be needed. It is very important that you tell your health care provider about any blood you see in your urine, even if the blood stops without treatment or happens without causing pain. Blood in your urine that happens and then stops and then happens again can be a symptom of a very serious condition. Also, pain is not a symptom in the initial stages of many urinary cancers. Follow these instructions at home:  Drink lots of fluid, 3-4 quarts a day. If you have been diagnosed with an infection, cranberry juice is especially recommended, in addition to large amounts of water.  Avoid caffeine, tea, and carbonated beverages because they tend to irritate the bladder.  Avoid alcohol because it may irritate the prostate.  Take all medicines as directed by your health care provider.  If you were prescribed an antibiotic medicine, finish it all even if you start to feel better.  If you have been diagnosed with a kidney stone, follow your health care provider's instructions regarding straining your urine to catch the stone.  Empty your bladder often. Avoid holding urine for long periods of time.  After a bowel movement, women should cleanse front to back. Use each tissue only once.  Empty your bladder before and after sexual intercourse if you are a female. Contact a health care provider if:  You develop back pain.  You have a fever.  You have a feeling of sickness in your stomach (nausea) or vomiting.  Your symptoms are  not better in 3 days. Return sooner if you are getting worse. Get help right away if:  You develop severe vomiting and are unable to keep the medicine down.  You develop severe back or abdominal pain despite taking your medicines.  You begin passing a large amount of blood or clots in your urine.  You feel extremely weak or faint, or you pass out. This information is not intended to replace advice given to you by your health care provider. Make sure you discuss any questions you have with your health care provider. Document Released: 02/22/2005 Document Revised: 07/31/2015 Document Reviewed: 10/23/2012 Elsevier Interactive Patient Education  2017 [employer] Inc[employer]    Dietary Guidelines to Help Prevent Kidney Stones Kidney stones are deposits of minerals and salts that form inside your kidneys. Your risk of developing kidney stones may be greater depending on your diet, your lifestyle, the medicines you take, and whether you have certain medical conditions. Most people can reduce their chances of developing kidney stones by following the instructions below. Depending on your overall health and the type of kidney stones you tend to develop, your dietitian may give you more specific instructions. What are tips for following this plan? Reading food labels  Choose foods with "no salt added" or "low-salt" labels. Limit your sodium intake to less than 1500 mg per day.  Choose foods with calcium for each meal and snack. Try to eat about 300 mg of calcium at each meal. Foods that contain 200-500  mg of calcium per serving include: ? 8 oz (237 ml) of milk, fortified nondairy milk, and fortified fruit juice. ? 8 oz (237 ml) of kefir, yogurt, and soy yogurt. ? 4 oz (118 ml) of tofu. ? 1 oz of cheese. ? 1 cup (300 g) of dried figs. ? 1 cup (91 g) of cooked broccoli. ? 1-3 oz can of sardines or mackerel.  Most people need 1000 to 1500 mg of calcium each day. Talk to your dietitian about how much  calcium is recommended for you. Shopping  Buy plenty of fresh fruits and vegetables. Most people do not need to avoid fruits and vegetables, even if they contain nutrients that may contribute to kidney stones.  When shopping for convenience foods, choose: ? Whole pieces of fruit. ? Premade salads with dressing on the side. ? Low-fat fruit and yogurt smoothies.  Avoid buying frozen meals or prepared deli foods.  Look for foods with live cultures, such as yogurt and kefir. Cooking  Do not add salt to food when cooking. Place a salt shaker on the table and allow each person to add his or her own salt to taste.  Use vegetable protein, such as beans, textured vegetable protein (TVP), or tofu instead of meat in pasta, casseroles, and soups. Meal planning  Eat less salt, if told by your dietitian. To do this: ? Avoid eating processed or premade food. ? Avoid eating fast food.  Eat less animal protein, including cheese, meat, poultry, or fish, if told by your dietitian. To do this: ? Limit the number of times you have meat, poultry, fish, or cheese each week. Eat a diet free of meat at least 2 days a week. ? Eat only one serving each day of meat, poultry, fish, or seafood. ? When you prepare animal protein, cut pieces into small portion sizes. For most meat and fish, one serving is about the size of one deck of cards.  Eat at least 5 servings of fresh fruits and vegetables each day. To do this: ? Keep fruits and vegetables on hand for snacks. ? Eat 1 piece of fruit or a handful of berries with breakfast. ? Have a salad and fruit at lunch. ? Have two kinds of vegetables at dinner.  Limit foods that are high in a substance called oxalate. These include: ? Spinach. ? Rhubarb. ? Beets. ? Potato chips and french fries. ? Nuts.  If you regularly take a diuretic medicine, make sure to eat at least 1-2 fruits or vegetables high in potassium each day. These  include: ? Avocado. ? Banana. ? Orange, prune, carrot, or tomato juice. ? Baked potato. ? Cabbage. ? Beans and split peas. General instructions  Drink enough fluid to keep your urine clear or pale yellow. This is the most important thing you can do.  Talk to your health care provider and dietitian about taking daily supplements. Depending on your health and the cause of your kidney stones, you may be advised: ? Not to take supplements with vitamin C. ? To take a calcium supplement. ? To take a daily probiotic supplement. ? To take other supplements such as magnesium, fish oil, or vitamin B6.  Take all medicines and supplements as told by your health care provider.  Limit alcohol intake to no more than 1 drink a day for nonpregnant women and 2 drinks a day for men. One drink equals 12 oz of beer, 5 oz of wine, or 1 oz of hard liquor.  Lose  weight if told by your health care provider. Work with your dietitian to find strategies and an eating plan that works best for you. What foods are not recommended? Limit your intake of the following foods, or as told by your dietitian. Talk to your dietitian about specific foods you should avoid based on the type of kidney stones and your overall health. Grains Breads. Bagels. Rolls. Baked goods. Salted crackers. Cereal. Pasta. Vegetables Spinach. Rhubarb. Beets. Canned vegetables. Rosita Fire. Olives. Meats and other protein foods Nuts. Nut butters. Large portions of meat, poultry, or fish. Salted or cured meats. Deli meats. Hot dogs. Sausages. Dairy Cheese. Beverages Regular soft drinks. Regular vegetable juice. Seasonings and other foods Seasoning blends with salt. Salad dressings. Canned soups. Soy sauce. Ketchup. Barbecue sauce. Canned pasta sauce. Casseroles. Pizza. Lasagna. Frozen meals. Potato chips. [place] fries. Summary  You can reduce your risk of kidney stones by making changes to your diet.  The most important thing you can do is  drink enough fluid. You should drink enough fluid to keep your urine clear or pale yellow.  Ask your health care provider or dietitian how much protein from animal sources you should eat each day, and also how much salt and calcium you should have each day. This information is not intended to replace advice given to you by your health care provider. Make sure you discuss any questions you have with your health care provider. Document Released: 06/19/2010 Document Revised: 02/03/2016 Document Reviewed: 02/03/2016 Elsevier Interactive Patient Education  2017 [employer].

## 2016-12-01 NOTE — Progress Notes (Signed)
Subjective:    Patient ID: [name], female    DOB: Sep 09, 1955, 61 y.o.   MRN: [id number]  HPI 61 y.o. nonsmoking WF s/p hysterectomy presents with hematuria x 60 day.  Back from dinner last night, went to urinate and had "stream of blood", very painful, x several hours. Went from bright red to tissue in it. Took azo. She has pain in her back and left flank, feels bad. Does have history of 1 kidney stone. No pain at this time, no appetite, no nausea, no bleeding at this time. Chills last night but no fever.   Blood pressure 118/66, pulse 60, temperature (!) 97.3 F (36.3 C), resp. rate 16, height 5' 6.5" (1.689 m), weight 215 lb 9.6 oz (97.8 kg), SpO2 98 %.  Medications Current Outpatient Prescriptions on File Prior to Visit  Medication Sig  . aspirin 81 MG tablet Take 162 mg by mouth at bedtime.   Marland Kitchen aspirin-acetaminophen-caffeine (EXCEDRIN MIGRAINE) 250-250-65 MG per tablet Take 2 tablets by mouth daily as needed for migraine.  . carvedilol (COREG) 25 MG tablet TAKE 1 TABLET (25 MG TOTAL) BY MOUTH 2 (TWO) TIMES DAILY WITH A MEAL  . diazepam (VALIUM) 5 MG tablet Take 1 tablet (5 mg total) by mouth at bedtime as needed for anxiety. (Patient taking differently: Take 2.5-5 mg by mouth daily as needed for anxiety. )  . estradiol (ESTRACE) 1 MG tablet Take 1.5 tablets by mouth daily.  . fenofibrate micronized (LOFIBRA) 134 MG capsule TAKE 1 CAPSULE BY MOUTH ONCE DAILY BEFORE BREAKFAST  . imipramine (TOFRANIL) 25 MG tablet TAKE 1 TO 2 TABLETS BY MOUTH EVERY DAY  . loratadine (CLARITIN) 10 MG tablet Take 10 mg by mouth daily.   Marland Kitchen losartan (COZAAR) 100 MG tablet Take 0.5 tablets (50 mg total) by mouth daily.  . metFORMIN (GLUCOPHAGE) 1000 MG tablet Take 1,000 mg by mouth 2 (two) times daily with a meal.  . NOVOLIN 70/30 RELION (70-30) 100 UNIT/ML injection Inject 70 Units into the skin 2 (two) times daily.   . pantoprazole (PROTONIX) 40 MG tablet Take 40 mg by mouth daily as needed (For acid  reflux).   . Potassium 99 MG TABS Take 1 tablet by mouth daily.  . rosuvastatin (CRESTOR) 40 MG tablet TAKE 1 TABLET(40 MG) BY MOUTH DAILY  . spironolactone (ALDACTONE) 25 MG tablet TAKE 1 TABLET (25 MG TOTAL) BY MOUTH 2 (TWO) TIMES DAILY.  . valACYclovir (VALTREX) 500 MG tablet TAKE 1 TABLET(500 MG) BY MOUTH DAILY  . verapamil (CALAN-SR) 180 MG CR tablet TAKE 1 TABLET(180 MG) BY MOUTH AT BEDTIME   No current facility-administered medications on file prior to visit.     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   Review of Systems  Constitutional: Positive for chills and fatigue. Negative for diaphoresis and fever.  HENT: Negative.   Respiratory: Negative.   Cardiovascular: Negative.   Gastrointestinal: Negative.  Negative for nausea and vomiting.  Genitourinary: Positive for dysuria, flank pain, frequency and urgency. Negative for decreased urine volume, difficulty urinating, dyspareunia, enuresis, genital sores, hematuria, menstrual problem, pelvic  pain, vaginal bleeding and vaginal discharge.       Objective:   Physical Exam  Constitutional: She is oriented to person, place, and time. She appears well-developed and well-nourished.  Neck: Normal range of motion. Neck supple.  Cardiovascular: Normal rate and regular rhythm.   Pulmonary/Chest: Effort normal and breath sounds normal.  Abdominal: Soft. Bowel sounds are normal. She exhibits no  distension and no mass. There is tenderness (right lower AB). There is CVA tenderness (right side). There is no rebound and no guarding.  Musculoskeletal: Normal range of motion. She exhibits no tenderness.  Neurological: She is alert and oriented to person, place, and time.  Skin: Skin is warm and dry.      Assessment & Plan:  Hematuria kidney stone versus UTI Non smoker Check urine, CBC, BMP Will get KUB Declines pain meds or zofran at this time Bactrim sent in  The patient was advised to call immediately if she has any concerning symptoms in the interval. The patient voices understanding of current treatment options and is in agreement with the current care plan.The patient knows to call the clinic with any problems, questions or concerns or go to the ER if any further progression of symptoms.

## 2016-12-02 ENCOUNTER — Encounter: Payer: Self-pay | Admitting: Physician Assistant

## 2016-12-02 LAB — URINALYSIS, COMPLETE
BACTERIA UA: NONE SEEN /HPF
BILIRUBIN URINE: NEGATIVE
GLUCOSE, UA: NEGATIVE
HYALINE CAST: NONE SEEN /LPF
Hgb urine dipstick: NEGATIVE
Ketones, ur: NEGATIVE
NITRITE: NEGATIVE
PROTEIN: NEGATIVE
RBC / HPF: NONE SEEN /HPF (ref 0–2)
Specific Gravity, Urine: 1.007 (ref 1.001–1.03)
pH: 7.5 (ref 5.0–8.0)

## 2016-12-02 LAB — URINE CULTURE
MICRO NUMBER:: 81068126
SPECIMEN QUALITY:: ADEQUATE

## 2016-12-02 LAB — CBC WITH DIFFERENTIAL/PLATELET
BASOS PCT: 0.6 %
Basophils Absolute: 70 cells/uL (ref 0–200)
EOS ABS: 222 {cells}/uL (ref 15–500)
EOS PCT: 1.9 %
HCT: 39.1 % (ref 35.0–45.0)
HEMOGLOBIN: 13.2 g/dL (ref 11.7–15.5)
Lymphs Abs: 2925 cells/uL (ref 850–3900)
MCH: 30.9 pg (ref 27.0–33.0)
MCHC: 33.8 g/dL (ref 32.0–36.0)
MCV: 91.6 fL (ref 80.0–100.0)
MONOS PCT: 5.7 %
MPV: 10.3 fL (ref 7.5–12.5)
NEUTROS ABS: 7816 {cells}/uL — AB (ref 1500–7800)
Neutrophils Relative %: 66.8 %
Platelets: 289 10*3/uL (ref 140–400)
RBC: 4.27 10*6/uL (ref 3.80–5.10)
RDW: 13.8 % (ref 11.0–15.0)
Total Lymphocyte: 25 %
WBC mixed population: 667 cells/uL (ref 200–950)
WBC: 11.7 10*3/uL — ABNORMAL HIGH (ref 3.8–10.8)

## 2016-12-02 LAB — BASIC METABOLIC PANEL WITH GFR
BUN: 16 mg/dL (ref 7–25)
CALCIUM: 9.9 mg/dL (ref 8.6–10.4)
CO2: 25 mmol/L (ref 20–32)
CREATININE: 0.93 mg/dL (ref 0.50–0.99)
Chloride: 104 mmol/L (ref 98–110)
GFR, Est African American: 77 mL/min/{1.73_m2} (ref >=60)
GFR, Est Non African American: 67 mL/min/{1.73_m2} (ref >=60)
GLUCOSE: 110 mg/dL — AB (ref 65–99)
Potassium: 3.9 mmol/L (ref 3.5–5.3)
Sodium: 141 mmol/L (ref 135–146)

## 2016-12-02 MED ORDER — TRAMADOL HCL 50 MG PO TABS: ORAL_TABLET | ORAL | 0 refills | Status: DC

## 2016-12-03 ENCOUNTER — Encounter: Payer: Self-pay | Admitting: Physician Assistant

## 2016-12-03 ENCOUNTER — Other Ambulatory Visit: Payer: Self-pay | Admitting: Physician Assistant

## 2016-12-03 MED ORDER — CIPROFLOXACIN HCL 500 MG PO TABS: 500.0000 mg | ORAL_TABLET | Freq: Two times a day (BID) | ORAL | 0 refills | Status: DC

## 2016-12-10 ENCOUNTER — Encounter: Payer: Self-pay | Admitting: Internal Medicine

## 2016-12-13 ENCOUNTER — Other Ambulatory Visit: Payer: Self-pay | Admitting: Cardiovascular Disease

## 2016-12-13 NOTE — Telephone Encounter (Signed)
Rx request sent to pharmacy.  

## 2016-12-31 ENCOUNTER — Other Ambulatory Visit: Payer: Self-pay | Admitting: Cardiovascular Disease

## 2016-12-31 DIAGNOSIS — I5022 Chronic systolic (congestive) heart failure: Secondary | ICD-10-CM

## 2017-01-04 ENCOUNTER — Other Ambulatory Visit: Payer: Self-pay | Admitting: Physician Assistant

## 2017-01-05 MED ORDER — DIAZEPAM 5 MG PO TABS: 5.0000 mg | ORAL_TABLET | Freq: Every evening | ORAL | 0 refills | Status: DC | PRN

## 2017-01-28 ENCOUNTER — Other Ambulatory Visit: Payer: Self-pay | Admitting: Cardiovascular Disease

## 2017-02-01 NOTE — Telephone Encounter (Signed)
Rx(s) sent to pharmacy electronically.  

## 2017-02-23 ENCOUNTER — Ambulatory Visit: Payer: Self-pay | Admitting: Physician Assistant

## 2017-03-08 HISTORY — PX: BREAST BIOPSY: SHX20

## 2017-03-18 ENCOUNTER — Encounter: Payer: Self-pay | Admitting: Physician Assistant

## 2017-03-18 ENCOUNTER — Ambulatory Visit (INDEPENDENT_AMBULATORY_CARE_PROVIDER_SITE_OTHER): Payer: BLUE CROSS/BLUE SHIELD | Admitting: Physician Assistant

## 2017-03-18 VITALS — BP 126/84 | HR 60 | Temp 97.4°F | Resp 14 | Ht 66.5 in | Wt 213.2 lb

## 2017-03-18 DIAGNOSIS — R519 Headache, unspecified: Secondary | ICD-10-CM

## 2017-03-18 DIAGNOSIS — E1143 Type 2 diabetes mellitus with diabetic autonomic (poly)neuropathy: Secondary | ICD-10-CM

## 2017-03-18 DIAGNOSIS — IMO0002 Reserved for concepts with insufficient information to code with codable children: Secondary | ICD-10-CM

## 2017-03-18 DIAGNOSIS — E1322 Other specified diabetes mellitus with diabetic chronic kidney disease: Secondary | ICD-10-CM

## 2017-03-18 DIAGNOSIS — E7800 Pure hypercholesterolemia, unspecified: Secondary | ICD-10-CM

## 2017-03-18 DIAGNOSIS — R51 Headache: Secondary | ICD-10-CM | POA: Diagnosis not present

## 2017-03-18 DIAGNOSIS — I428 Other cardiomyopathies: Secondary | ICD-10-CM | POA: Diagnosis not present

## 2017-03-18 DIAGNOSIS — I5022 Chronic systolic (congestive) heart failure: Secondary | ICD-10-CM

## 2017-03-18 DIAGNOSIS — N182 Chronic kidney disease, stage 2 (mild): Secondary | ICD-10-CM | POA: Diagnosis not present

## 2017-03-18 DIAGNOSIS — E1365 Other specified diabetes mellitus with hyperglycemia: Secondary | ICD-10-CM

## 2017-03-18 DIAGNOSIS — I10 Essential (primary) hypertension: Secondary | ICD-10-CM

## 2017-03-18 MED ORDER — METOCLOPRAMIDE HCL 5 MG PO TABS: 5.0000 mg | ORAL_TABLET | Freq: Three times a day (TID) | ORAL | 0 refills | Status: DC | PRN

## 2017-03-18 MED ORDER — RIZATRIPTAN BENZOATE 10 MG PO TABS: 10.0000 mg | ORAL_TABLET | ORAL | 0 refills | Status: DC | PRN

## 2017-03-18 NOTE — Progress Notes (Signed)
FOLLOW UP  Assessment and Plan:   Acute nonintractable headache, unspecified headache type -     Sedimentation rate -     rizatriptan (MAXALT) 10 MG tablet; Take 1 tablet (10 mg total) by mouth as needed for migraine. May repeat in 2 hours if needed -     metoCLOPramide (REGLAN) 5 MG tablet; Take 1 tablet (5 mg total) by mouth every 8 (eight) hours as needed for nausea (migraines). - normal neuro, some temporal right pain, check ESR, + TMJ info given Will go to the ER if worsening headache, changes vision/speech, imbalance, weakness.  Essential hypertension - continue medications, DASH diet, exercise and monitor at home. Call if greater than 130/80.   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  Diabetic autonomic neuropathy associated with type 2 diabetes mellitus ([place]) Discussed general issues about diabetes pathophysiology and management., Educational material distributed., Suggested low cholesterol diet., Encouraged aerobic exercise., Discussed foot care., Reminded to get yearly retinal exam.  HYPERCHOLESTEROLEMIA -continue medications, check lipids, decrease fatty foods, increase activity.   Morbid obesity ([place]) - follow up 3 months for progress monitoring - increase veggies, decrease carbs - long discussion about weight loss, diet, and exercise   Continue diet and meds as discussed. Further disposition pending results of labs. Discussed med's effects and SE's.   Over 30 minutes of exam, counseling, chart review, and critical decision making was performed PATIENT  DECLINES LABS TODAY AND FOLLOW UP UNTIL SEPT FOLLOWS WITH DR. [place] Future Appointments  Date Time Provider [place]  03/29/2017  8:45 AM Evans Lance, MD [facility]  11/22/2017  9:00 AM Vicie Mutters, PA-C [facility] None     HPI 62 y.o. female  presents for 3 month follow up on hypertension, cholesterol, diabetes and vitamin D deficiency.   She has a history of cluster migraines, has had consistent headache x 4 weeks. She has doubled her imipramine to 43m at night. She has light sensitivity, right temporal pain, with feeling pins/needles. Does not have a rescue medication, did well with maxalt in the past. No changes in vision, speech, weakness, dizziness. Some nausea with it.   Her blood pressure has been controlled at home, today their BP is BP: 126/84   She does not workout. She denies chest pain, shortness of breath, dizziness.   She is on cholesterol medication and denies myalgias. Her cholesterol is at goal. The cholesterol last visit was:   Lab Results  Component Value Date   CHOL 87 11/22/2016   HDL 30 (L) 11/22/2016   LDLCALC 44 11/19/2015   TRIG 113 11/22/2016   CHOLHDL 2.9 11/22/2016   She has been working on diet and exercise for Diabetes with diabetic chronic kidney disease and with diabetic autonomic neuropathy, she is on bASA, she is on ACE/ARB, follows with Dr. BChalmers Caterand denies paresthesia of the feet, polydipsia, polyuria and visual disturbances. Last A1C was:  Lab Results  Component Value Date   HGBA1C 7.2 (H) 11/22/2016   Patient is on Vitamin D supplement.   Lab Results  Component Value Date   VD25OH 55 11/22/2016     BMI is Body mass index is 33.9 kg/m., she is working  on diet and exercise. She is going to the weight management center.  Wt Readings from Last 3 Encounters:  03/18/17 213 lb 3.2 oz (96.7 kg)  12/01/16 215 lb 9.6 oz (97.8 kg)  11/22/16 220 lb 9.6 oz (100.1 kg)    Current Medications:  Current  Outpatient Medications on File Prior to Visit  Medication Sig  . aspirin 81 MG tablet Take 162 mg by mouth at bedtime.   Marland Kitchen aspirin-acetaminophen-caffeine (EXCEDRIN MIGRAINE) 250-250-65 MG per tablet Take 2 tablets by mouth daily as needed for migraine.  . carvedilol (COREG) 25 MG tablet TAKE 1 TABLET BY MOUTH TWICE DAILY WITH A MEAL  . diazepam (VALIUM) 5 MG tablet Take 1 tablet (5 mg total) by mouth at bedtime as needed for anxiety.  Marland Kitchen estradiol (ESTRACE) 1 MG tablet Take 1.5 tablets by mouth daily.  . fenofibrate micronized (LOFIBRA) 134 MG capsule TAKE 1 CAPSULE BY MOUTH EVERY DAY BEFORE BREAKFAST  . imipramine (TOFRANIL) 25 MG tablet TAKE 1 TO 2 TABLETS BY MOUTH EVERY DAY  . loratadine (CLARITIN) 10 MG tablet Take 10 mg by mouth daily.   Marland Kitchen losartan (COZAAR) 100 MG tablet Take 0.5 tablets (50 mg total) by mouth daily.  . metFORMIN (GLUCOPHAGE) 1000 MG tablet Take 1,000 mg by mouth 2 (two) times daily with a meal.  . NOVOLIN 70/30 RELION (70-30) 100 UNIT/ML injection Inject 70 Units into the skin 2 (two) times daily.   . pantoprazole (PROTONIX) 40 MG tablet Take 40 mg by mouth daily as needed (For acid reflux).   . Potassium 99 MG TABS Take 1 tablet by mouth daily.  . rosuvastatin (CRESTOR) 40 MG tablet TAKE 1 TABLET(40 MG) BY MOUTH DAILY  . spironolactone (ALDACTONE) 25 MG tablet TAKE 1 TABLET BY MOUTH TWICE DAILY  . traMADol (ULTRAM) 50 MG tablet 1 pill twice daily as needed for pain  . valACYclovir (VALTREX) 500 MG tablet TAKE 1 TABLET(500 MG) BY MOUTH DAILY  . verapamil (CALAN-SR) 180 MG CR tablet TAKE 1 TABLET(180 MG) BY MOUTH AT BEDTIME   No current facility-administered medications on file prior to visit.     Medical History:  Past Medical History:  Diagnosis Date  . Automatic implantable cardiac defibrillator in [place]  . CHF (congestive heart failure) ([place])   . Diabetes mellitus   . Hyperlipidemia   . Hypertension 06/25/2011   Renal dopplers - superior  mesenteric artery >50% diameter reduction; R renal artery - normal patency; L proximal renal artery 1-59% reduction (lower end of scale); both kidneys normal in size/symmetry with normal cortex and medulla  . Leg pain 12/09/2010   doppler of R femoral artery - no evidence of dissection, AV fistula, pseudoaneurysm or other vascular abnormalities  . Migraines   . Obesity   . Sleep apnea    on CPAP - AHI during sleep study was 44  . Type II or unspecified type diabetes mellitus without mention of complication, not stated as uncontrolled   . Type II or unspecified type diabetes mellitus without mention of complication, not stated as uncontrolled   . Unspecified sleep apnea    Allergies:  Allergies  Allergen Reactions  . Flagyl [Metronidazole] Anaphylaxis  . Niacin And Related Other (See Comments) and Cough    flushing  . Penicillins Swelling    Has patient had a PCN reaction causing immediate rash, facial/tongue/throat swelling, SOB or lightheadedness with hypotension: No Has patient had a PCN reaction causing severe rash involving  mucus membranes or skin necrosis: No Has patient had a PCN reaction that required hospitalization No Has patient had a PCN reaction occurring within the last 10 years: Yes 3-4 years ago If all of the above answers are "NO", then may proceed with Cephalosporin use.  . Yeast-Related Products Swelling and Rash     Review of Systems:  ROS  Family history- Review and unchanged Social history- Review and unchanged Physical Exam: BP 126/84   Pulse 60   Temp (!) 97.4 F (36.3 C)   Resp 14   Ht 5' 6.5" (1.689 m)   Wt 213 lb 3.2 oz (96.7 kg)   SpO2 97%   BMI 33.90 kg/m  Wt Readings from Last 3 Encounters:  03/18/17 213 lb 3.2 oz (96.7 kg)  12/01/16 215 lb 9.6 oz (97.8 kg)  11/22/16 220 lb 9.6 oz (100.1 kg)   General Appearance: Well nourished, in no apparent distress. Eyes: PERRLA, EOMs, conjunctiva no swelling or erythema Sinuses: No Frontal/maxillary  tenderness ENT/Mouth: Ext aud canals clear, TMs without erythema, bulging. No erythema, swelling, or exudate on post pharynx.  Tonsils not swollen or erythematous. Hearing normal. + TMJ tenderness bilateral and right temporal tenderness to palpation.  Neck: Supple, thyroid normal.  Respiratory: Respiratory effort normal, BS equal bilaterally without rales, rhonchi, wheezing or stridor.  Cardio: RRR with no MRGs. Brisk peripheral pulses without edema.  Abdomen: Soft, + BS.  Non tender, no guarding, rebound, hernias, masses. Lymphatics: Non tender without lymphadenopathy.  Musculoskeletal: Full ROM, 5/5 strength, Normal gait Skin: Warm, dry without rashes, lesions, ecchymosis.  Neuro: Cranial nerves intact. No cerebellar symptoms.  Psych: Awake and oriented X 3, normal affect, Insight and Judgment appropriate.    Vicie Mutters, PA-C 9:50 AM [facility]

## 2017-03-18 NOTE — Patient Instructions (Signed)
To prevent migraines you can try these natural/OTC medications as prevention: 1) Melatonin 5mg -15mg  30 mins before bed 2) Riboflavin/B2 400mg  daily 3) CoQ10 100mg  3 x a day  We may also treat TMJ if we think you have it If you are having frequent migraines we may put you on a once a day medication with fast acting medication to take. Also there is such a thing called rebound headache from over use of acute medications.  Please do not use rescue or acute medications more than 10 days a month or more than 3 days per week, this can cause a withdrawal and a rebound headache.  Here is more information below  Please remember, common headache triggers are: sleep deprivation, dehydration, overheating, stress, hypoglycemia or skipping meals and blood sugar fluctuations, excessive pain medications or excessive alcohol use or caffeine withdrawal. Some people have food triggers such as aged cheese, orange juice or chocolate, especially dark chocolate, or MSG (monosodium glutamate). Try to avoid these headache triggers as much possible.   It may be helpful to keep a headache diary to figure out what makes your headaches worse or brings them on and what alleviates them. Some people report headache onset after exercise but studies have shown that regular exercise may actually prevent headaches from coming. If you have exercise-induced headaches, please make sure that you drink plenty of fluid before and after exercising and that you do not over do it and do not overheat.   Please go to the ER if there is weakness, thunderclap headache, visual changes, or any concerning factors    Migraine Headache A migraine headache is an intense, throbbing pain on one or both sides of your head. Recurrent migraines keep coming back. A migraine can last for 30 minutes to several hours. CAUSES  The exact cause of a migraine headache is not always known. However, a migraine may be caused when nerves in the brain become  irritated and release chemicals that cause inflammation. This causes pain. Certain things may also trigger migraines, such as:   Alcohol.  Smoking.  Stress.  Menstruation.  Aged cheeses.  Foods or drinks that contain nitrates, glutamate, aspartame, or tyramine.  Lack of sleep.  Chocolate.  Caffeine.  Hunger.  Physical exertion.  Fatigue.  Medicines used to treat chest pain (nitroglycerine), birth control pills, estrogen, and some blood pressure medicines. SYMPTOMS   Pain on one or both sides of your head.  Pulsating or throbbing pain.  Severe pain that prevents daily activities.  Pain that is aggravated by any physical activity.  Nausea, vomiting, or both.  Dizziness.  Pain with exposure to bright lights, loud noises, or activity.  General sensitivity to bright lights, loud noises, or smells. Before you get a migraine, you may get warning signs that a migraine is coming (aura). An aura may include:  Seeing flashing lights.  Seeing bright spots, halos, or zigzag lines.  Having tunnel vision or blurred vision.  Having feelings of numbness or tingling.  Having trouble talking.  Having muscle weakness. DIAGNOSIS  A recurrent migraine headache is often diagnosed based on:  Symptoms.  Physical examination.  A CT scan or MRI of your head. These imaging tests cannot diagnose migraines but can help rule out other causes of headaches.  TREATMENT  Medicines may be given for pain and nausea. Medicines can also be given to help prevent recurrent migraines. HOME CARE INSTRUCTIONS  Only take over-the-counter or prescription medicines for pain or discomfort as directed by your health  care provider. The use of long-term narcotics is not recommended.  Lie down in a dark, quiet room when you have a migraine.  Keep a journal to find out what may trigger your migraine headaches. For example, write down:  What you eat and drink.  How much sleep you get.  Any  change to your diet or medicines.  Limit alcohol consumption.  Quit smoking if you smoke.  Get 7-9 hours of sleep, or as recommended by your health care provider.  Limit stress.  Keep lights dim if bright lights bother you and make your migraines worse. SEEK MEDICAL CARE IF:   You do not get relief from the medicines given to you.  You have a recurrence of pain.  You have a fever. SEEK IMMEDIATE MEDICAL CARE IF:  Your migraine becomes severe.  You have a stiff neck.  You have loss of vision.  You have muscular weakness or loss of muscle control.  You start losing your balance or have trouble walking.  You feel faint or pass out. You have severe symptoms that are different from your first symptoms. MAKE SURE YOU:   Understand these instructions.  Will watch your condition.  Will get help right away if you are not doing well or get worse.   This information is not intended to replace advice given to you by your health care provider. Make sure you discuss any questions you have with your health care provider.   Document Released: 11/17/2000 Document Revised: 03/15/2014 Document Reviewed: 10/30/2012 Elsevier Interactive Patient Education 2016 [employer].  Common Migraine Triggers   Foods Aged cheese, alcohol, nuts, chocolate, yogurt, onions, figs, liver, caffeinated foods and beverages, monosodium glutamate (MSG), smoked or pickled fish/meat, nitrate/nitrate preserved foods (hotdogs, pepperoni, salami) tyramine  Medications Antibiotics (tetracycline, griseofulvin), antihypertensives (nifedipine, captopril), hormones (oral contraceptives, estrogens), histamine-2 blockers (cimetidine, raniidine, vasodilators (nitroglycerine, isosorbide dinitrate)  Sensory Stimuli Flickering/bright/fluorescent lights, bright sunlight, odors (perfume, chemicals, cigarette smoke)  Lifestyle Changes Time zones, sleep patterns, eating habits, caffeine withdrawal stress  Other Menstrual  cycle, weather/season/air pressure changes, high altitude  Adapted from [place] and [place], [place]. Clin. J. Med. 1995; [place] and [place]. Conquering Headache, 1998  Hormonal variations also are believed to play a part.  Fluctuations of the female hormone estrogen (such as just before menstruation) affect a chemical called serotonin-when serotonin levels in the brain fall, the dilation (expansion) of blood vessels in the brain that is characteristic of migraine often follows.  Many factors or "triggers" can start a migraine.  In people who get migraines, most experts think certain activities or foods may trigger temporary changes in the blood vessels around the brain.  Swelling of these blood vessels may cause pain in the nearby nerves.  Allergy Headaches:  Hotdogs Milk  Onions  Thyme Bacon  Chocolate Garlic  Nutmeg Ham  Dark Cola Pork  Cinnamon Salami  Nuts  Egg  Ginger Sausage Red wine Cloves  Cheddar Cheese Caffeine   What is the TMJ? The temporomandibular (tem-PUH-ro-man-DIB-yoo-ler) joint, or the TMJ, connects the upper and lower jawbones. This joint allows the jaw to open wide and move back and forth when you chew, talk, or yawn.There are also several muscles that help this joint move. There can be muscle tightness and pain in the muscle that can cause several symptoms.  What causes TMJ pain? There are many causes of TMJ pain. Repeated chewing (for example, chewing gum) and clenching your teeth can cause pain in the joint. Some TMJ pain has  no obvious cause. What can I do to ease the pain? There are many things you can do to help your pain get better. When you have pain:  Eat soft foods and stay away from chewy foods (for example, taffy) Try to use both sides of your mouth to chew Don't chew gum Massage Don't open your mouth wide (for example, during yawning or singing) Don't bite your cheeks or fingernails Lower your amount of stress and worry Applying a warm, damp washcloth  to the joint may help. Over-the-counter pain medicines such as ibuprofen (one brand: Advil) or acetaminophen (one brand: Tylenol) might also help. Do not use these medicines if you are allergic to them or if your doctor told you not to use them. How can I stop the pain from coming back? When your pain is better, you can do these exercises to make your muscles stronger and to keep the pain from coming back:  Resisted mouth opening: Place your thumb or two fingers under your chin and open your mouth slowly, pushing up lightly on your chin with your thumb. Hold for three to six seconds. Close your mouth slowly. Resisted mouth closing: Place your thumbs under your chin and your two index fingers on the ridge between your mouth and the bottom of your chin. Push down lightly on your chin as you close your mouth. Tongue up: Slowly open and close your mouth while keeping the tongue touching the roof of the mouth. Side-to-side jaw movement: Place an object about one fourth of an inch thick (for example, two tongue depressors) between your front teeth. Slowly move your jaw from side to side. Increase the thickness of the object as the exercise becomes easier Forward jaw movement: Place an object about one fourth of an inch thick between your front teeth and move the bottom jaw forward so that the bottom teeth are in front of the top teeth. Increase the thickness of the object as the exercise becomes easier. These exercises should not be painful. If it hurts to do these exercises, stop doing them and talk to your family doctor.

## 2017-03-19 LAB — SEDIMENTATION RATE: SED RATE: 2 mm/h (ref 0–30)

## 2017-03-29 ENCOUNTER — Encounter: Payer: Self-pay | Admitting: Internal Medicine

## 2017-03-29 ENCOUNTER — Ambulatory Visit (INDEPENDENT_AMBULATORY_CARE_PROVIDER_SITE_OTHER): Payer: BLUE CROSS/BLUE SHIELD | Admitting: Internal Medicine

## 2017-03-29 VITALS — BP 118/60 | HR 76 | Ht 65.5 in | Wt 221.0 lb

## 2017-03-29 DIAGNOSIS — Z950 Presence of cardiac pacemaker: Secondary | ICD-10-CM

## 2017-03-29 DIAGNOSIS — I5022 Chronic systolic (congestive) heart failure: Secondary | ICD-10-CM

## 2017-03-29 DIAGNOSIS — I428 Other cardiomyopathies: Secondary | ICD-10-CM

## 2017-03-29 NOTE — Progress Notes (Signed)
HPI [name] returns today for followup. 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 In the interim she has been well with no chest pain or sob. She has begun participating in a weight loss management program and has lost over 30 lbs. She feels well. Allergies  Allergen Reactions  . Flagyl [Metronidazole] Anaphylaxis  . Niacin And Related Other (See Comments) and Cough    flushing  . Penicillins Swelling    Has patient had a PCN reaction causing immediate rash, facial/tongue/throat swelling, SOB or lightheadedness with hypotension: No Has patient had a PCN reaction causing severe rash involving mucus membranes or skin necrosis: No Has patient had a PCN reaction that required hospitalization No Has patient had a PCN reaction occurring within the last 10 years: Yes 3-4 years ago If all of the above answers are "NO", then may proceed with Cephalosporin use.  Marland Kitchen Yeast-Related Products Swelling and Rash     Current Outpatient Medications  Medication Sig Dispense Refill  . aspirin 81 MG tablet Take 162 mg by mouth at bedtime.     Marland Kitchen aspirin-acetaminophen-caffeine (EXCEDRIN MIGRAINE) 250-250-65 MG per tablet Take 2 tablets by mouth daily as needed for migraine.    . carvedilol (COREG) 25 MG tablet TAKE 1 TABLET BY MOUTH TWICE DAILY WITH A MEAL 180 tablet 1  . diazepam (VALIUM) 5 MG tablet Take 1 tablet (5 mg total) by mouth at bedtime as needed for anxiety. 30 tablet 0  . estradiol (ESTRACE) 1 MG tablet Take 1.5 tablets by mouth daily.  4  . fenofibrate micronized (LOFIBRA) 134 MG capsule TAKE 1 CAPSULE BY MOUTH EVERY DAY BEFORE BREAKFAST 90 capsule 2  . imipramine (TOFRANIL) 25 MG tablet TAKE 1 TO 2 TABLETS BY MOUTH EVERY DAY 180 tablet 1  . loratadine (CLARITIN) 10  MG tablet Take 10 mg by mouth daily.     Marland Kitchen losartan (COZAAR) 100 MG tablet Take 0.5 tablets (50 mg total) by mouth daily. 45 tablet 1  . metFORMIN (GLUCOPHAGE) 1000 MG tablet Take 1,000 mg by mouth 2 (two) times daily with a meal.    . metoCLOPramide (REGLAN) 5 MG tablet Take 1 tablet (5 mg total) by mouth every 8 (eight) hours as needed for nausea (migraines). 20 tablet 0  . NOVOLIN 70/30 RELION (70-30) 100 UNIT/ML injection Inject 70 Units into the skin 2 (two) times daily. Currently doing 35 units bid  0  . pantoprazole (PROTONIX) 40 MG tablet Take 40 mg by mouth daily as needed (For acid reflux).     . Potassium 99 MG TABS Take 1 tablet by mouth daily.    . rizatriptan (MAXALT) 10 MG tablet Take 1 tablet (10 mg total) by mouth as needed for migraine. May repeat in 2 hours if needed 10 tablet 0  . rosuvastatin (CRESTOR) 40 MG tablet TAKE 1 TABLET(40 MG) BY MOUTH DAILY 90 tablet 3  . spironolactone (ALDACTONE) 25 MG tablet TAKE 1 TABLET BY MOUTH TWICE DAILY 180 tablet 2  . traMADol (ULTRAM) 50 MG tablet 1 pill twice daily as needed for pain 60 tablet 0  . valACYclovir (VALTREX) 500 MG tablet TAKE 1 TABLET(500 MG) BY MOUTH DAILY 90 tablet 0  . verapamil (CALAN-SR) 180 MG CR tablet TAKE 1 TABLET(180 MG) BY MOUTH AT BEDTIME 30 tablet  11   No current facility-administered medications for this visit.      Past Medical History:  Diagnosis Date  . Automatic implantable cardiac defibrillator in situ    ST. JUDE MODEL 7122  . CHF (congestive heart failure) (HCC)   . Diabetes mellitus   . Hyperlipidemia   . Hypertension 06/25/2011   Renal dopplers - superior mesenteric artery >50% diameter reduction; R renal artery - normal patency; L proximal renal artery 1-59% reduction (lower end of scale); both kidneys normal in size/symmetry with normal cortex and medulla  . Leg pain 12/09/2010   doppler of R femoral artery - no evidence of dissection, AV fistula, pseudoaneurysm or other vascular abnormalities   . Migraines   . Obesity   . Sleep apnea    on CPAP - AHI during sleep study was 44  . Type II or unspecified type diabetes mellitus without mention of complication, not stated as uncontrolled   . Type II or unspecified type diabetes mellitus without mention of complication, not stated as uncontrolled   . Unspecified sleep apnea     ROS:   All systems reviewed and negative except as noted in the HPI.   Past Surgical History:  Procedure Laterality Date  . ABDOMINAL HYSTERECTOMY    . BIV PACEMAKER GENERATOR CHANGE OUT N/A 01/16/2014   Procedure: BIV PACEMAKER GENERATOR CHANGE OUT;  Surgeon: Marinus Maw, MD;  Location: [facility] CATH LAB;  Service: Cardiovascular;  Laterality: N/A;  . CARDIAC CATHETERIZATION  07/15/2006   no significant CAD by cardiac cath, confirmed by intravascular [place] of LAD; non-ischemic cardiomyopathy prob related to uncontrolled hypertension, DM and morbid obesity; moderate pulmonary hypertension; preserved cardiac output and cardiac index  . CARDIAC DEFIBRILLATOR PLACEMENT     ICD by Dr. Ladona Ridgel  . CARDIOVASCULAR STRESS TEST  08/28/2010   R/P MV - pattern of normal perfusion in all regions, no scintigraphic evidence of inducible myocardial ischemia; no EKG changes; normal perfusion study; pt did experience chesst pain during strudy, resolved spontaneously  . DOPPLER ECHOCARDIOGRAPHY  06/25/2011   EF >55%; moderate concentric LV hypertrophy; LA mildly dilated, mild mitral annular calcification;   . KNEE SURGERY    . NECK SURGERY    . PACEMAKER INSERTION       Family History  Problem Relation Age of Onset  . Heart attack Father   . Hearing loss Father   . Hypertension Father   . Cancer Mother   . Cancer Maternal Aunt        colon  . Cancer Maternal Uncle        colon  . Cancer Maternal Grandfather        colon cancer     Social History   Socioeconomic History  . Marital status: Married    Spouse name: Not on file  . Number of children: Not on file  .  Years of education: Not on file  . Highest education level: Not on file  Social Needs  . Financial resource strain: Not on file  . Food insecurity - worry: Not on file  . Food insecurity - inability: Not on file  . Transportation needs - medical: Not on file  . Transportation needs - non-medical: Not on file  Occupational History  . Not on file  Tobacco Use  . Smoking status: Former Smoker    Types: Cigarettes    Last attempt to quit: 03/09/1975    Years since quitting: 42.0  . Smokeless tobacco: Never Used  Substance and  Sexual Activity  . Alcohol use: Yes    Alcohol/week: 0.0 oz  . Drug use: No  . Sexual activity: Not on file  Other Topics Concern  . Not on file  Social History Narrative   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     BP 118/60   Pulse 76   Ht 5' 5.5" (1.664 m)   Wt 221 lb (100.2 kg)   SpO2 97%   BMI 36.22 kg/m   Physical Exam:  Well appearing 62 yo woman, NAD HEENT: Unremarkable Neck:  6 cm JVD, no thyromegally Lymphatics:  No adenopathy Back:  No CVA tenderness Lungs:  Clear with no wheezes HEART:  Regular rate rhythm, no murmurs, no rubs, no clicks Abd:  soft, positive bowel sounds, no organomegally, no rebound, no guarding Ext:  2 plus pulses, no edema, no cyanosis, no clubbing Skin:  No rashes no nodules Neuro:  CN II through XII intact, motor grossly intact  EKG - nsr with biv pacing  DEVICE  Normal device function.  See PaceArt for details.   Assess/Plan: 1. Chronic systolic heart failure - she has had normalization and her symptoms remain class 1. She is limited mostly by arthritis.  2. HTN - her blood pressure is controlled. Will follow. 3. BiV PPM - her St. Jude device is working normally. She will recheck in several months. 4. Obesity - she is down over 30 lbs and will try to continue losing. She is encouraged to continue her exercise program.  Leonia Reeves.D.

## 2017-03-29 NOTE — Patient Instructions (Signed)
Medication Instructions:  Your physician recommends that you continue on your current medications as directed. Please refer to the Current Medication list given to you today.  Labwork: None ordered.  Testing/Procedures: None ordered.  Follow-Up: Your physician wants you to follow-up in: One Year with Dr Ladona Ridgel.  You will receive a reminder letter in the mail two months in advance. If you don't receive a letter, please call our office to schedule the follow-up appointment.  Remote monitoring is used to monitor your ICD from home. This monitoring reduces the number of office visits required to check your device to one time per year. It allows [place] to keep an eye on the functioning of your device to ensure it is working properly. You are scheduled for a device check from home on 06/28/2017. You may send your transmission at any time that day. If you have a wireless device, the transmission will be sent automatically. After your physician reviews your transmission, you will receive a postcard with your next transmission date.    Any Other Special Instructions Will Be Listed Below (If Applicable).     If you need a refill on your cardiac medications before your next appointment, please call your pharmacy.

## 2017-04-01 ENCOUNTER — Encounter: Payer: Self-pay | Admitting: Cardiovascular Disease

## 2017-04-01 ENCOUNTER — Encounter: Payer: Self-pay | Admitting: Internal Medicine

## 2017-04-01 LAB — CUP PACEART INCLINIC DEVICE CHECK
Brady Statistic RV Percent Paced: 99.89 %
Implantable Lead Implant Date: 20091016
Implantable Lead Location: 753858
Implantable Lead Location: 753860
Implantable Pulse Generator Implant Date: 20151111
Lead Channel Impedance Value: 787.5 Ohm
Lead Channel Pacing Threshold Amplitude: 0.75 V
Lead Channel Pacing Threshold Amplitude: 0.75 V
Lead Channel Pacing Threshold Amplitude: 1.25 V
Lead Channel Pacing Threshold Pulse Width: 0.3 ms
Lead Channel Pacing Threshold Pulse Width: 0.3 ms
Lead Channel Pacing Threshold Pulse Width: 0.6 ms
Lead Channel Sensing Intrinsic Amplitude: 3.8 mV
Lead Channel Setting Pacing Amplitude: 2 V
Lead Channel Setting Pacing Pulse Width: 0.3 ms
MDC IDC LEAD IMPLANT DT: 20091016
MDC IDC LEAD IMPLANT DT: 20091016
MDC IDC LEAD LOCATION: 753859
MDC IDC MSMT BATTERY REMAINING LONGEVITY: 99 mo
MDC IDC MSMT BATTERY VOLTAGE: 2.98 V
MDC IDC MSMT LEADCHNL LV PACING THRESHOLD AMPLITUDE: 1.25 V
MDC IDC MSMT LEADCHNL LV PACING THRESHOLD PULSEWIDTH: 0.6 ms
MDC IDC MSMT LEADCHNL RA IMPEDANCE VALUE: 400 Ohm
MDC IDC MSMT LEADCHNL RA PACING THRESHOLD PULSEWIDTH: 0.3 ms
MDC IDC MSMT LEADCHNL RV IMPEDANCE VALUE: 475 Ohm
MDC IDC MSMT LEADCHNL RV PACING THRESHOLD AMPLITUDE: 1 V
MDC IDC MSMT LEADCHNL RV PACING THRESHOLD AMPLITUDE: 1 V
MDC IDC MSMT LEADCHNL RV PACING THRESHOLD PULSEWIDTH: 0.3 ms
MDC IDC MSMT LEADCHNL RV SENSING INTR AMPL: 11.3 mV
MDC IDC PG SERIAL: 7688750
MDC IDC SESS DTM: 20190122144956
MDC IDC SET LEADCHNL LV PACING AMPLITUDE: 2 V
MDC IDC SET LEADCHNL LV PACING PULSEWIDTH: 0.6 ms
MDC IDC SET LEADCHNL RV PACING AMPLITUDE: 2.5 V
MDC IDC SET LEADCHNL RV SENSING SENSITIVITY: 2 mV
MDC IDC STAT BRADY RA PERCENT PACED: 4.8 %

## 2017-04-01 NOTE — Telephone Encounter (Signed)
Spoke with pt informed her that she needs to be near her home monitor on 06/28/17 and if not then she would need to call the device clinic and set up an alternative date to send a transmission and she would need to do a manual transmission, pt voiced understanding.

## 2017-04-12 ENCOUNTER — Encounter: Payer: Self-pay | Admitting: *Deleted

## 2017-05-20 ENCOUNTER — Other Ambulatory Visit: Payer: Self-pay | Admitting: Cardiovascular Disease

## 2017-05-20 DIAGNOSIS — I10 Essential (primary) hypertension: Secondary | ICD-10-CM

## 2017-05-20 NOTE — Telephone Encounter (Signed)
REFILL 

## 2017-05-30 ENCOUNTER — Other Ambulatory Visit: Payer: Self-pay | Admitting: Cardiovascular Disease

## 2017-06-13 ENCOUNTER — Ambulatory Visit: Payer: BLUE CROSS/BLUE SHIELD | Admitting: Cardiovascular Disease

## 2017-06-25 ENCOUNTER — Other Ambulatory Visit: Payer: Self-pay | Admitting: Cardiovascular Disease

## 2017-06-25 DIAGNOSIS — I5022 Chronic systolic (congestive) heart failure: Secondary | ICD-10-CM

## 2017-06-28 ENCOUNTER — Ambulatory Visit (INDEPENDENT_AMBULATORY_CARE_PROVIDER_SITE_OTHER): Payer: BLUE CROSS/BLUE SHIELD | Admitting: *Deleted

## 2017-06-28 DIAGNOSIS — I428 Other cardiomyopathies: Secondary | ICD-10-CM | POA: Diagnosis not present

## 2017-06-28 NOTE — Progress Notes (Signed)
Remote pacemaker transmission.   

## 2017-06-29 ENCOUNTER — Encounter: Payer: Self-pay | Admitting: Cardiology

## 2017-06-30 LAB — CUP PACEART REMOTE DEVICE CHECK
Date Time Interrogation Session: 20190425104806
Implantable Lead Implant Date: 20091016
Implantable Lead Location: 753858
Implantable Lead Location: 753860
Implantable Lead Model: 4196
Implantable Lead Model: 7122
MDC IDC LEAD IMPLANT DT: 20091016
MDC IDC LEAD IMPLANT DT: 20091016
MDC IDC LEAD LOCATION: 753859
MDC IDC PG IMPLANT DT: 20151111
MDC IDC PG SERIAL: 7688750

## 2017-07-18 ENCOUNTER — Ambulatory Visit (INDEPENDENT_AMBULATORY_CARE_PROVIDER_SITE_OTHER): Payer: BLUE CROSS/BLUE SHIELD | Admitting: Physician Assistant

## 2017-07-18 ENCOUNTER — Encounter: Payer: Self-pay | Admitting: Physician Assistant

## 2017-07-18 VITALS — BP 120/74 | HR 79 | Temp 98.6°F | Resp 18 | Ht 65.5 in | Wt 220.8 lb

## 2017-07-18 DIAGNOSIS — R05 Cough: Secondary | ICD-10-CM | POA: Diagnosis not present

## 2017-07-18 DIAGNOSIS — R059 Cough, unspecified: Secondary | ICD-10-CM

## 2017-07-18 MED ORDER — AZITHROMYCIN 250 MG PO TABS: ORAL_TABLET | ORAL | 1 refills | Status: AC

## 2017-07-18 MED ORDER — FLUTICASONE PROPIONATE 50 MCG/ACT NA SUSP: 2.0000 | Freq: Every day | NASAL | 1 refills | Status: DC

## 2017-07-18 MED ORDER — BENZONATATE 100 MG PO CAPS: 100.0000 mg | ORAL_CAPSULE | Freq: Three times a day (TID) | ORAL | 0 refills | Status: DC | PRN

## 2017-07-18 NOTE — Progress Notes (Signed)
Subjective:    Patient ID: [name], female    DOB: Nov 16, 1955, 62 y.o.   MRN: [id number]  HPI 62 y.o. obese WF with history of CHF, DM2, OSA presents with sinus issues/cough x  6 days of symptoms. She has sore throat, sinus pressure, fever, cough with white mucus, some chest tightness and SOB with cough, no wheezing. Weight is stable. She denies edema, PND, has orthopnea unchanged.    CXR 03/2016  BMI is Body mass index is 36.18 kg/m., she is working on diet and exercise. Wt Readings from Last 3 Encounters:  07/18/17 220 lb 12.8 oz (100.2 kg)  03/29/17 221 lb (100.2 kg)  03/18/17 213 lb 3.2 oz (96.7 kg)   Blood pressure 120/74, pulse 79, temperature 98.6 F (37 C), resp. rate 18, height 5' 5.5" (1.664 m), weight 220 lb 12.8 oz (100.2 kg), SpO2 97 %.  Medications Current Outpatient Medications on File Prior to Visit  Medication Sig  . aspirin 81 MG tablet Take 162 mg by mouth at bedtime.   Marland Kitchen aspirin-acetaminophen-caffeine (EXCEDRIN MIGRAINE) 250-250-65 MG per tablet Take 2 tablets by mouth daily as needed for migraine.  . carvedilol (COREG) 25 MG tablet TAKE 1 TABLET BY MOUTH TWICE DAILY WITH A MEAL  . diazepam (VALIUM) 5 MG tablet Take 1 tablet (5 mg total) by mouth at bedtime as needed for anxiety.  Marland Kitchen estradiol (ESTRACE) 1 MG tablet Take 1.5 tablets by mouth daily.  . fenofibrate micronized (LOFIBRA) 134 MG capsule TAKE 1 CAPSULE BY MOUTH EVERY DAY BEFORE BREAKFAST  . imipramine (TOFRANIL) 25 MG tablet TAKE 1 TO 2 TABLETS BY MOUTH EVERY DAY  . loratadine (CLARITIN) 10 MG tablet Take 10 mg by mouth daily.   Marland Kitchen losartan (COZAAR) 100 MG tablet TAKE 1/2 TABLET(50 MG) BY MOUTH DAILY  . metFORMIN (GLUCOPHAGE) 1000 MG tablet Take 1,000 mg by mouth 2 (two) times daily with a meal.  . metoCLOPramide (REGLAN) 5 MG tablet Take 1 tablet (5 mg total) by mouth every 8 (eight) hours as needed for nausea (migraines).  . NOVOLIN 70/30 RELION (70-30) 100 UNIT/ML injection Inject 70 Units  into the skin 2 (two) times daily. Currently doing 35 units bid  . pantoprazole (PROTONIX) 40 MG tablet Take 40 mg by mouth daily as needed (For acid reflux).   . Potassium 99 MG TABS Take 1 tablet by mouth daily.  . rizatriptan (MAXALT) 10 MG tablet Take 1 tablet (10 mg total) by mouth as needed for migraine. May repeat in 2 hours if needed  . rosuvastatin (CRESTOR) 40 MG tablet TAKE 1 TABLET(40 MG) BY MOUTH DAILY  . spironolactone (ALDACTONE) 25 MG tablet TAKE 1 TABLET BY MOUTH TWICE DAILY  . traMADol (ULTRAM) 50 MG tablet 1 pill twice daily as needed for pain  . valACYclovir (VALTREX) 500 MG tablet TAKE 1 TABLET(500 MG) BY MOUTH DAILY  . verapamil (CALAN-SR) 180 MG CR tablet TAKE 1 TABLET(180 MG) BY MOUTH AT BEDTIME   No current facility-administered medications on file prior to visit.     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  Review of Systems  Constitutional: Negative for chills and diaphoresis.  HENT: Positive for congestion, postnasal drip, sinus pressure and sneezing. Negative for ear pain and sore throat.   Respiratory: Positive for cough and chest tightness. Negative for shortness of breath and wheezing.   Cardiovascular: Negative.   Gastrointestinal: Negative.   Genitourinary: Negative.   Musculoskeletal: Negative for neck pain.  Neurological: Positive for headaches.       Objective:   Physical Exam  Constitutional: She is  oriented to person, place, and time. She appears well-developed and well-nourished.  HENT:  Right Ear: Hearing and external ear normal. No mastoid tenderness. Tympanic membrane is injected. Tympanic membrane is not perforated, not erythematous, not retracted and not bulging. A middle ear effusion is present.  Left Ear: Hearing and external ear normal. No mastoid tenderness. Tympanic membrane is injected. Tympanic membrane is not perforated, not erythematous, not retracted and not bulging. A middle ear effusion is present.  Nose: Right sinus exhibits maxillary sinus tenderness. Left sinus exhibits maxillary sinus tenderness.  Mouth/Throat: Uvula is midline, oropharynx is clear and moist and mucous membranes are normal.  Eyes: Pupils are equal, round, and reactive to light. Conjunctivae and EOM are normal.  Neck: Neck supple.  Cardiovascular: Normal rate and regular rhythm.  Pulmonary/Chest: Effort normal and breath sounds normal. No respiratory distress. She has no wheezes.  Abdominal: Soft. Bowel sounds are normal.  Musculoskeletal: Normal range of motion.  Lymphadenopathy:    She has no cervical adenopathy.  Neurological: She is alert and oriented to person, place, and time.  Skin: Skin is warm and dry.       Assessment & Plan:    Cough Weight stable, no edema, monitor weight Likely sinusitis -     azithromycin (ZITHROMAX) 250 MG tablet; Take 2 tablets (500 mg) on  Day 1,  followed by 1 tablet (250 mg) once daily on Days 2 through 5. -     fluticasone (FLONASE) 50 MCG/ACT nasal spray; Place 2 sprays into both nostrils at bedtime. -     benzonatate (TESSALON PERLES) 100 MG capsule; Take 1 capsule (100 mg total) by mouth 3 (three) times daily as needed for cough (Max: 600mg  per day).  The patient was advised to call immediately if she has any concerning symptoms in the interval. The patient voices understanding of current treatment options and is in agreement with the current care  plan.The patient knows to call the clinic with any problems, questions or concerns or go to the ER if any further progression of symptoms.     Future Appointments  Date Time Provider Department Center  09/05/2017  8:00 AM Lennette Bihari, MD [facility]  09/27/2017  8:35 AM [facility] DEVICE REMOTES [facility]  11/22/2017  9:00 AM Quentin Mulling, PA-C [id number] None

## 2017-07-18 NOTE — Patient Instructions (Signed)

## 2017-07-20 ENCOUNTER — Emergency Department (HOSPITAL_COMMUNITY)
Admission: EM | Admit: 2017-07-20 | Discharge: 2017-07-20 | Disposition: A | Discharge status: Home / Self Care | Payer: BLUE CROSS/BLUE SHIELD | Attending: Emergency Medicine | Admitting: Emergency Medicine

## 2017-07-20 ENCOUNTER — Telehealth: Payer: Self-pay | Admitting: *Deleted

## 2017-07-20 ENCOUNTER — Encounter (HOSPITAL_COMMUNITY): Payer: Self-pay | Admitting: Family Medicine

## 2017-07-20 ENCOUNTER — Emergency Department (HOSPITAL_COMMUNITY): Payer: BLUE CROSS/BLUE SHIELD

## 2017-07-20 DIAGNOSIS — Z7982 Long term (current) use of aspirin: Secondary | ICD-10-CM | POA: Diagnosis not present

## 2017-07-20 DIAGNOSIS — F411 Generalized anxiety disorder: Secondary | ICD-10-CM | POA: Diagnosis not present

## 2017-07-20 DIAGNOSIS — I5022 Chronic systolic (congestive) heart failure: Secondary | ICD-10-CM | POA: Insufficient documentation

## 2017-07-20 DIAGNOSIS — Z794 Long term (current) use of insulin: Secondary | ICD-10-CM | POA: Diagnosis not present

## 2017-07-20 DIAGNOSIS — Z950 Presence of cardiac pacemaker: Secondary | ICD-10-CM | POA: Insufficient documentation

## 2017-07-20 DIAGNOSIS — E119 Type 2 diabetes mellitus without complications: Secondary | ICD-10-CM | POA: Diagnosis not present

## 2017-07-20 DIAGNOSIS — Z87891 Personal history of nicotine dependence: Secondary | ICD-10-CM | POA: Insufficient documentation

## 2017-07-20 DIAGNOSIS — N390 Urinary tract infection, site not specified: Secondary | ICD-10-CM

## 2017-07-20 DIAGNOSIS — Z79899 Other long term (current) drug therapy: Secondary | ICD-10-CM | POA: Diagnosis not present

## 2017-07-20 DIAGNOSIS — I110 Hypertensive heart disease with heart failure: Secondary | ICD-10-CM | POA: Diagnosis not present

## 2017-07-20 DIAGNOSIS — R319 Hematuria, unspecified: Secondary | ICD-10-CM

## 2017-07-20 DIAGNOSIS — R1031 Right lower quadrant pain: Secondary | ICD-10-CM | POA: Diagnosis present

## 2017-07-20 LAB — CBC WITH DIFFERENTIAL/PLATELET
BASOS PCT: 0 %
Basophils Absolute: 0 10*3/uL (ref 0.0–0.1)
Eosinophils Absolute: 0.2 10*3/uL (ref 0.0–0.7)
Eosinophils Relative: 1 %
HEMATOCRIT: 44.4 % (ref 36.0–46.0)
Hemoglobin: 15.5 g/dL — ABNORMAL HIGH (ref 12.0–15.0)
Lymphocytes Relative: 17 %
Lymphs Abs: 2.9 10*3/uL (ref 0.7–4.0)
MCH: 32 pg (ref 26.0–34.0)
MCHC: 34.9 g/dL (ref 30.0–36.0)
MCV: 91.7 fL (ref 78.0–100.0)
MONO ABS: 1 10*3/uL (ref 0.1–1.0)
MONOS PCT: 6 %
NEUTROS ABS: 13 10*3/uL — AB (ref 1.7–7.7)
Neutrophils Relative %: 76 %
Platelets: 314 10*3/uL (ref 150–400)
RBC: 4.84 MIL/uL (ref 3.87–5.11)
RDW: 12.9 % (ref 11.5–15.5)
WBC: 17 10*3/uL — ABNORMAL HIGH (ref 4.0–10.5)

## 2017-07-20 LAB — URINALYSIS, ROUTINE W REFLEX MICROSCOPIC: RBC / HPF: >50 RBC/hpf — ABNORMAL HIGH (ref 0–5)

## 2017-07-20 LAB — BASIC METABOLIC PANEL
Anion gap: 12 (ref 5–15)
BUN: 13 mg/dL (ref 6–20)
CALCIUM: 10.3 mg/dL (ref 8.9–10.3)
CO2: 26 mmol/L (ref 22–32)
CREATININE: 0.98 mg/dL (ref 0.44–1.00)
Chloride: 100 mmol/L — ABNORMAL LOW (ref 101–111)
GFR calc Af Amer: >60 mL/min (ref >60)
GFR calc non Af Amer: >60 mL/min (ref >60)
Glucose, Bld: 172 mg/dL — ABNORMAL HIGH (ref 65–99)
Potassium: 3.9 mmol/L (ref 3.5–5.1)
Sodium: 138 mmol/L (ref 135–145)

## 2017-07-20 MED ORDER — MORPHINE SULFATE (PF) 4 MG/ML IV SOLN
4.0000 mg | Freq: Once | INTRAVENOUS | Status: AC
  Administered 2017-07-20: 4 mg via INTRAVENOUS
  Filled 2017-07-20: qty 1

## 2017-07-20 MED ORDER — NITROFURANTOIN MONOHYD MACRO 100 MG PO CAPS: 100.0000 mg | ORAL_CAPSULE | Freq: Two times a day (BID) | ORAL | 0 refills | Status: DC

## 2017-07-20 MED ORDER — ONDANSETRON HCL 4 MG/2ML IJ SOLN
4.0000 mg | Freq: Once | INTRAMUSCULAR | Status: AC
  Administered 2017-07-20: 4 mg via INTRAVENOUS
  Filled 2017-07-20: qty 2

## 2017-07-20 MED ORDER — MORPHINE SULFATE (PF) 4 MG/ML IV SOLN
4.0000 mg | Freq: Once | INTRAVENOUS | Status: AC
Start: 2017-07-20 — End: 2017-07-20
  Administered 2017-07-20: 4 mg via INTRAVENOUS
  Filled 2017-07-20: qty 1

## 2017-07-20 MED ORDER — SODIUM CHLORIDE 0.9 % IV SOLN
1.0000 g | Freq: Once | INTRAVENOUS | Status: AC
  Administered 2017-07-20: 1 g via INTRAVENOUS
  Filled 2017-07-20: qty 10

## 2017-07-20 MED ORDER — PHENAZOPYRIDINE HCL 200 MG PO TABS: 200.0000 mg | ORAL_TABLET | Freq: Three times a day (TID) | ORAL | 0 refills | Status: DC

## 2017-07-20 MED ORDER — KETOROLAC TROMETHAMINE 30 MG/ML IJ SOLN
30.0000 mg | Freq: Once | INTRAMUSCULAR | Status: AC
  Administered 2017-07-20: 30 mg via INTRAVENOUS
  Filled 2017-07-20: qty 1

## 2017-07-20 MED ORDER — SODIUM CHLORIDE 0.9 % IV BOLUS
500.0000 mL | Freq: Once | INTRAVENOUS | Status: AC
  Administered 2017-07-20: 500 mL via INTRAVENOUS

## 2017-07-20 NOTE — ED Triage Notes (Signed)
Patient states this morning she noticed blood in urine, increase urgency and frequency. About an 1.5 hours ago she developed lower back pain, mostly on the right side and nausea. Has a history of kidney stones.

## 2017-07-20 NOTE — Discharge Instructions (Addendum)
You have a urinary tract infection.  No evidence of a kidney stone.  Increase fluids.  Prescription for antibiotic and medicine for urinary pain (which will make your urine orange)

## 2017-07-20 NOTE — Telephone Encounter (Signed)
Patient called and complained of low abdominal pain, increased urge to urinate and bright red blood in her urine.  She states she has passed a kidney before,but has never seen a urologist. Per Dr Oneta Rack, she was advised to go to [facility] to be evaluated.  Patient is aware,but did not commit to go.

## 2017-07-20 NOTE — ED Provider Notes (Signed)
[facility]-EMERGENCY DEPT Provider Note   CSN: [id number] Arrival date & time: 07/20/17  1523     History   Chief Complaint Chief Complaint  Patient presents with  . Hematuria  . Nausea    HPI [name] is a 62 y.o. female.  Hematuria, dysuria, frequency earlier today with associated right flank pain.  No fever, sweats, chills.  Past medical history includes defibrillator, CHF, diabetes, hypertension, obesity, others.  Initial history of kidney stones.  She took nothing at home for the pain.  Severity is moderate.     Past Medical History:  Diagnosis Date  . Automatic implantable cardiac defibrillator in situ    [employer]DEL 7122  . CHF (congestive heart failure) (HCC)   . Diabetes mellitus   . Hyperlipidemia   . Hypertension 06/25/2011   Renal dopplers - superior mesenteric artery >50% diameter reduction; R renal artery - normal patency; L proximal renal artery 1-59% reduction (lower end of scale); both kidneys normal in size/symmetry with normal cortex and medulla  . Leg pain 12/09/2010   doppler of R femoral artery - no evidence of dissection, AV fistula, pseudoaneurysm or other vascular abnormalities  . Migraines   . Obesity   . Sleep apnea    on CPAP - AHI during sleep study was 44  . Type II or unspecified type diabetes mellitus without mention of complication, not stated as uncontrolled   . Type II or unspecified type diabetes mellitus without mention of complication, not stated as uncontrolled   . Unspecified sleep apnea     Patient Active Problem List   Diagnosis Date Noted  . Uncontrolled type 2 diabetes mellitus with hyperglycemia, with long-term current use of insulin (HCC)   . Chronic systolic congestive heart failure (HCC)   . Morbid obesity (HCC) 07/25/2014  . GERD (gastroesophageal reflux disease) 07/25/2014  . Generalized anxiety disorder 07/25/2014  . 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 OSA on CPAP 07/09/2014  . Biventricular cardiac pacemaker in situ 01/16/2014  . 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 Migraines   . Nonischemic cardiomyopathy (HCC) 08/30/2012  . Diastolic dysfunction 08/30/2012  . HYPERCHOLESTEROLEMIA 03/17/2009  . Essential hypertension 03/17/2009    Past Surgical History:  Procedure Laterality Date  . ABDOMINAL HYSTERECTOMY    . BIV PACEMAKER GENERATOR CHANGE OUT N/A 01/16/2014   Procedure: BIV PACEMAKER GENERATOR CHANGE OUT;  Surgeon: Marinus Maw, MD;  Location: [facility] CATH LAB;  Service: Cardiovascular;  Laterality: N/A;  . CARDIAC CATHETERIZATION  07/15/2006   no significant CAD by cardiac cath, confirmed by intravascular [place] of LAD; non-ischemic cardiomyopathy prob related to uncontrolled hypertension, DM and morbid obesity; moderate pulmonary hypertension; preserved cardiac output and cardiac index  . CARDIAC DEFIBRILLATOR PLACEMENT     ICD by Dr. Ladona Ridgel  . CARDIOVASCULAR STRESS TEST  08/28/2010   R/P MV - pattern of normal perfusion in all regions, no scintigraphic evidence of inducible myocardial ischemia; no EKG changes; normal perfusion study; pt did experience chesst pain during strudy, resolved spontaneously  . DOPPLER ECHOCARDIOGRAPHY  06/25/2011   EF >55%; moderate concentric LV hypertrophy; LA mildly dilated, mild mitral annular calcification;   . KNEE SURGERY    . NECK SURGERY    . PACEMAKER INSERTION       OB History   None      Home Medications    Prior to Admission medications  Medication Sig Start Date End Date Taking? Authorizing Provider  aspirin 81 MG tablet Take 162 mg by mouth at bedtime.    Yes [provider]  aspirin-acetaminophen-caffeine (EXCEDRIN MIGRAINE) [phone] MG per tablet Take 2 tablets by mouth daily as needed for migraine.   Yes [provider]  azithromycin (ZITHROMAX) 250 MG tablet Take 2 tablets (500 mg) on  Day 1,  followed by  1 tablet (250 mg) once daily on Days 2 through 5. 07/18/17 07/23/17 Yes Quentin Mulling, PA-C  benzonatate (TESSALON PERLES) 100 MG capsule Take 1 capsule (100 mg total) by mouth 3 (three) times daily as needed for cough (Max: 600mg  per day). 07/18/17  Yes Quentin Mulling, PA-C  carvedilol (COREG) 25 MG tablet TAKE 1 TABLET BY MOUTH TWICE DAILY WITH A MEAL 05/30/17  Yes Lennette Bihari, MD  Cholecalciferol (VITAMIN D PO) Take by mouth.   Yes [provider]  diazepam (VALIUM) 5 MG tablet Take 1 tablet (5 mg total) by mouth at bedtime as needed for anxiety. 01/05/17  Yes Quentin Mulling, PA-C  estradiol (ESTRACE) 1 MG tablet Take 1.5 tablets by mouth daily. 05/21/16  Yes [provider]  fenofibrate micronized (LOFIBRA) 134 MG capsule TAKE 1 CAPSULE BY MOUTH EVERY DAY BEFORE BREAKFAST 02/01/17  Yes Lennette Bihari, MD  fluticasone [facility]) 50 MCG/ACT nasal spray Place 2 sprays into both nostrils at bedtime. 07/18/17  Yes Quentin Mulling, PA-C  imipramine (TOFRANIL) 25 MG tablet TAKE 1 TO 2 TABLETS BY MOUTH EVERY DAY 11/22/16  Yes Quentin Mulling, PA-C  loratadine (CLARITIN) 10 MG tablet Take 10 mg by mouth daily.    Yes [provider]  losartan (COZAAR) 100 MG tablet TAKE 1/2 TABLET(50 MG) BY MOUTH DAILY 06/27/17  Yes Lennette Bihari, MD  metFORMIN (GLUMETZA) 500 MG (MOD) 24 hr tablet Take 1,000 mg by mouth 2 (two) times daily with a meal.    Yes [provider]  NOVOLIN 70/30 RELION (70-30) 100 UNIT/ML injection Inject 35 Units into the skin 2 (two) times daily. Currently doing 35 units bid 02/05/15  Yes [provider]  Potassium 99 MG TABS Take 1 tablet by mouth daily.   Yes [provider]  rosuvastatin (CRESTOR) 40 MG tablet TAKE 1 TABLET(40 MG) BY MOUTH DAILY 09/10/16  Yes Lennette Bihari, MD  spironolactone (ALDACTONE) 25 MG tablet TAKE 1 TABLET BY MOUTH TWICE DAILY 02/01/17  Yes Lennette Bihari, MD  valACYclovir (VALTREX) 500 MG tablet TAKE 1  TABLET(500 MG) BY MOUTH DAILY 09/13/16  Yes Lucky Cowboy, MD  verapamil (CALAN-SR) 180 MG CR tablet TAKE 1 TABLET(180 MG) BY MOUTH AT BEDTIME 05/20/17  Yes Lennette Bihari, MD  metoCLOPramide (REGLAN) 5 MG tablet Take 1 tablet (5 mg total) by mouth every 8 (eight) hours as needed for nausea (migraines). Patient not taking: Reported on 07/20/2017 03/18/17   Quentin Mulling, PA-C  nitrofurantoin, macrocrystal-monohydrate, (MACROBID) 100 MG capsule Take 1 capsule (100 mg total) by mouth 2 (two) times daily. 07/20/17   Donnetta Hutching, MD  phenazopyridine (PYRIDIUM) 200 MG tablet Take 1 tablet (200 mg total) by mouth 3 (three) times daily. 07/20/17   Donnetta Hutching, MD  rizatriptan (MAXALT) 10 MG tablet Take 1 tablet (10 mg total) by mouth as needed for migraine. May repeat in 2 hours if needed Patient not taking: Reported on 07/20/2017 03/18/17   Quentin Mulling, PA-C  traMADol (ULTRAM) 50 MG tablet 1 pill twice daily as needed for pain Patient not taking:  Reported on 07/20/2017 12/02/16   Quentin Mulling, PA-C    Family History Family History  Problem Relation Age of Onset  . Heart attack Father   . Hearing loss Father   . Hypertension Father   . Cancer Mother   . Cancer Maternal Aunt        colon  . Cancer Maternal Uncle        colon  . Cancer Maternal Grandfather        colon cancer    Social History Social History   Tobacco Use  . Smoking status: Former Smoker    Types: Cigarettes    Last attempt to quit: 03/09/1975    Years since quitting: 42.3  . Smokeless tobacco: Never Used  Substance Use Topics  . Alcohol use: Yes    Comment: Once a month.   . Drug use: No     Allergies   Flagyl [metronidazole]; Niacin and related; Penicillins; and Yeast-related products   Review of Systems Review of Systems  All other systems reviewed and are negative.    Physical Exam Updated Vital Signs BP 130/77 (BP Location: Right Arm)   Pulse 74   Temp 98.5 F (36.9 C) (Oral)   Resp 16   Ht 5'  5.5" (1.664 m)   Wt 100.1 kg (220 lb 12 oz)   SpO2 98%   BMI 36.18 kg/m   Physical Exam  Constitutional: She is oriented to person, place, and time. She appears well-developed and well-nourished.  Does not appear toxic.  HENT:  Head: Normocephalic and atraumatic.  Eyes: Conjunctivae are normal.  Neck: Neck supple.  Cardiovascular: Normal rate and regular rhythm.  Pulmonary/Chest: Effort normal and breath sounds normal.  Abdominal: Soft. Bowel sounds are normal.  Minimal suprapubic tenderness  Genitourinary:  Genitourinary Comments: Right CVAT.  Musculoskeletal: Normal range of motion.  Neurological: She is alert and oriented to person, place, and time.  Skin: Skin is warm and dry.  Psychiatric: She has a normal mood and affect. Her behavior is normal.  Nursing note and vitals reviewed.    ED Treatments / Results  Labs (all labs ordered are listed, but only abnormal results are displayed) Labs Reviewed  URINALYSIS, ROUTINE W REFLEX MICROSCOPIC - Abnormal; Notable for the following components:      Result Value   Color, Urine RED (*)    Glucose, UA   (*)    Value: TEST NOT REPORTED DUE TO COLOR INTERFERENCE OF URINE PIGMENT   Hgb urine dipstick   (*)    Value: TEST NOT REPORTED DUE TO COLOR INTERFERENCE OF URINE PIGMENT   Bilirubin Urine   (*)    Value: TEST NOT REPORTED DUE TO COLOR INTERFERENCE OF URINE PIGMENT   Ketones, ur   (*)    Value: TEST NOT REPORTED DUE TO COLOR INTERFERENCE OF URINE PIGMENT   Protein, ur   (*)    Value: TEST NOT REPORTED DUE TO COLOR INTERFERENCE OF URINE PIGMENT   Nitrite   (*)    Value: TEST NOT REPORTED DUE TO COLOR INTERFERENCE OF URINE PIGMENT   Leukocytes, UA   (*)    Value: TEST NOT REPORTED DUE TO COLOR INTERFERENCE OF URINE PIGMENT   RBC / HPF >50 (*)    Bacteria, UA RARE (*)    All other components within normal limits  CBC WITH DIFFERENTIAL/PLATELET - Abnormal; Notable for the following components:   WBC 17.0 (*)    Hemoglobin  15.5 (*)    Neutro  Abs 13.0 (*)    All other components within normal limits  BASIC METABOLIC PANEL - Abnormal; Notable for the following components:   Chloride 100 (*)    Glucose, Bld 172 (*)    All other components within normal limits  URINE CULTURE    EKG None  Radiology Ct Renal Stone Study  Result Date: 07/20/2017 CLINICAL DATA:  Right flank pain and hematuria. EXAM: CT ABDOMEN AND PELVIS WITHOUT CONTRAST TECHNIQUE: Multidetector CT imaging of the abdomen and pelvis was performed following the standard protocol without IV contrast. COMPARISON:  CT abdomen 01/21/2009 FINDINGS: Lower chest: Mild atelectasis and scarring in the lung bases. Trace bilateral pleural fluid. Partially visualized ICD leads with small chronic pericardial effusion, mildly enlarged from 2010. Hepatobiliary: Hepatic steatosis with minimal fatty sparing along the gallbladder fossa. Unremarkable gallbladder. No biliary dilatation. Pancreas: Unremarkable. Spleen: Unremarkable. Adrenals/Urinary Tract: Unremarkable adrenal glands. New 4 mm left lower pole renal calculus. No hydronephrosis. No ureteral dilatation or definite ureteral calculi. Mildly thickened appearance of the bladder wall diffusely, possibly secondary to underdistention. Stomach/Bowel: The stomach is within normal limits. There is no evidence of bowel obstruction or inflammation. The appendix is unremarkable. Vascular/Lymphatic: Moderate abdominal aortic atherosclerosis without aneurysm. No enlarged lymph nodes. Reproductive: Status post hysterectomy.  No adnexal mass. Other: No intraperitoneal free fluid. Mild anterior abdominal wall laxity. Musculoskeletal: Diffuse lumbar facet arthrosis, severe at L4-5 where there is grade 1 anterolisthesis. No suspicious osseous lesion. IMPRESSION: 1. No acute abnormality identified in the abdomen or pelvis. 2. Nonobstructing left renal calculus. 3. Chronic small pericardial effusion, mildly enlarged from 2010. 4. Hepatic  steatosis. 5.  Aortic Atherosclerosis (ICD10-I70.0). Electronically Signed   By: Sebastian Ache M.D.   On: 07/20/2017 19:03    Procedures Procedures (including critical care time)  Medications Ordered in ED Medications  sodium chloride 0.9 % bolus 500 mL (0 mLs Intravenous Stopped 07/20/17 1802)  ondansetron (ZOFRAN) injection 4 mg (4 mg Intravenous Given 07/20/17 1648)  ketorolac (TORADOL) 30 MG/ML injection 30 mg (30 mg Intravenous Given 07/20/17 1648)  morphine 4 MG/ML injection 4 mg (4 mg Intravenous Given 07/20/17 1648)  cefTRIAXone (ROCEPHIN) 1 g in sodium chloride 0.9 % 100 mL IVPB (0 g Intravenous Stopped 07/20/17 1837)  morphine 4 MG/ML injection 4 mg (4 mg Intravenous Given 07/20/17 1844)     Initial Impression / Assessment and Plan / ED Course  I have reviewed the triage vital signs and the nursing notes.  Pertinent labs & imaging results that were available during my care of the patient were reviewed by me and considered in my medical decision making (see chart for details).     History and physical most consistent with kidney stone versus urinary tract infection.  CT scan reveals no obvious right stone.  Urine is obviously infected.  IV fluids, IV pain management, IV Rocephin, urine culture.  Discussed findings with the patient and her husband.  Discharge medications Macrobid 100 mg and Pyridium 200 mg.  She is very comfortable at discharge  Final Clinical Impressions(s) / ED Diagnoses   Final diagnoses:  Urinary tract infection with hematuria, site unspecified    ED Discharge Orders        Ordered    nitrofurantoin, macrocrystal-monohydrate, (MACROBID) 100 MG capsule  2 times daily     07/20/17 2046    phenazopyridine (PYRIDIUM) 200 MG tablet  3 times daily     07/20/17 2046       Donnetta Hutching, MD 07/20/17 2136

## 2017-07-22 LAB — URINE CULTURE: Culture: NO GROWTH

## 2017-09-05 ENCOUNTER — Ambulatory Visit (INDEPENDENT_AMBULATORY_CARE_PROVIDER_SITE_OTHER): Payer: BLUE CROSS/BLUE SHIELD | Admitting: Cardiovascular Disease

## 2017-09-05 ENCOUNTER — Encounter: Payer: Self-pay | Admitting: Cardiovascular Disease

## 2017-09-05 VITALS — BP 112/58 | HR 78 | Ht 67.0 in | Wt 224.2 lb

## 2017-09-05 DIAGNOSIS — E785 Hyperlipidemia, unspecified: Secondary | ICD-10-CM

## 2017-09-05 DIAGNOSIS — Z950 Presence of cardiac pacemaker: Secondary | ICD-10-CM

## 2017-09-05 DIAGNOSIS — I10 Essential (primary) hypertension: Secondary | ICD-10-CM | POA: Diagnosis not present

## 2017-09-05 DIAGNOSIS — Z794 Long term (current) use of insulin: Secondary | ICD-10-CM

## 2017-09-05 DIAGNOSIS — E118 Type 2 diabetes mellitus with unspecified complications: Secondary | ICD-10-CM

## 2017-09-05 DIAGNOSIS — G4733 Obstructive sleep apnea (adult) (pediatric): Secondary | ICD-10-CM

## 2017-09-05 DIAGNOSIS — E668 Other obesity: Secondary | ICD-10-CM | POA: Diagnosis not present

## 2017-09-05 DIAGNOSIS — I428 Other cardiomyopathies: Secondary | ICD-10-CM | POA: Diagnosis not present

## 2017-09-05 DIAGNOSIS — Z9989 Dependence on other enabling machines and devices: Secondary | ICD-10-CM | POA: Diagnosis not present

## 2017-09-05 NOTE — Progress Notes (Signed)
Patient ID: [name], female   DOB: Jan 14, 1956, 62 y.o.   MRN: [id number]      HPI: [name] is a 62 y.o. female who presents to the office for a 16 month cardiology evaluation.  [name] has a history of a nonischemic cardiomyopathy. Initial ejection fraction was in the range of 25-30%. She is status post biventricular ICD implantation by [name]. She also has a history of obstructive sleep apnea on CPAP therapy admits to being 100% compliant with usage. On her last  echo in April 2013 her ejection fraction has essentially normalized and was felt to be greater than 55%.  She did have diastolic dysfunction. There is evidence for mild aortic valve sclerosis. 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  She underwent a pacemaker generator change out by [name] on 01/16/2014 and with her normalization of LV function, she had a downgrade from an ICD to a by bi-v permanent pacemaker.  She saw [name] in follow-up in February 2016 at which time she had mild worsening of her heart failure symptoms with a change from class I.  The class IIb felt predominantly due to dietary and medical noncompliance.  She was advised to take torsemide twice a day until her weight and volume overload improved  I last saw her in April 2018.  At that time she was under increased stress.   In January 2018, she saw [name] at that time admitted to some increasing shortness of breath with exertion.  On 05/07/2016 a follow-up echo Doppler study which showed an EF of 55-60%. She did not have regional wall motion abnormalities.  However, there was grade 2 diastolic dysfunction.  ECG at that time showed normal sinus rhythm with biventricular pacing.  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  She was using  CPAP with 100% compliance.  Her DME company is [employer].    Since I last saw her, she admits to weight loss of approximately 36 pounds over 58-monthbut has gained 10 of those pounds back.  She denies any episodes of chest pain or edema.  She saw Dr. TLovena Lefor one year evaluation in January 2019.  She initially had undergone biventricular ICD implantation for a nonischemic cardio myopathy but with subsequent normalization of left ventricular function she underwent a downgrade to her to a biventricular permanent pacemaker in 2015. She continues to use CPAP with 100% compliance and is on her second machine since CPAP initiation.  She had developed urologic symptoms and has been under evaluation by Dr. BGloriann Loanfor hematuria with planned cystoscopy and CT imaging.  She will be going on a cruise.  She presents for evaluation.  Past Medical History:  Diagnosis Date  . [place] . [place]ive [place]   . Diabetes mellitus   . Hyperlipidemia   . Hypertension 06/25/2011   Renal dopplers - superior mesenteric artery >50% diameter reduction; R renal artery - normal patency; L proximal renal artery 1-59% reduction (lower end of scale); both kidneys normal in size/symmetry with normal cortex and medulla  . Leg pain 12/09/2010   doppler of R femoral artery - no evidence of dissection, AV fistula, pseudoaneurysm or other vascular abnormalities  . Migraines   .  Obesity   . Sleep apnea    on CPAP - AHI during sleep study was 44  . Type II or unspecified type diabetes mellitus without mention of complication, not stated as uncontrolled   . Type II or unspecified type diabetes mellitus without mention of complication, not stated as uncontrolled   . Unspecified sleep apnea     Past Surgical History:  Procedure Laterality Date  . ABDOMINAL HYSTERECTOMY    . BIV PACEMAKER GENERATOR CHANGE OUT N/A 01/16/2014   Procedure: BIV  PACEMAKER GENERATOR CHANGE OUT;  Surgeon: Kara Lance, MD;  Location: [facility] CATH LAB;  Service: Cardiovascular;  Laterality: N/A;  . CARDIAC CATHETERIZATION  07/15/2006   no significant CAD by cardiac cath, confirmed by intravascular [place] of LAD; non-ischemic cardiomyopathy prob related to uncontrolled hypertension, DM and morbid obesity; moderate pulmonary hypertension; preserved cardiac output and cardiac index  . CARDIAC DEFIBRILLATOR PLACEMENT     ICD by [name]  . CARDIOVASCULAR STRESS TEST  08/28/2010   R/P MV - pattern of normal perfusion in all regions, no scintigraphic evidence of inducible myocardial ischemia; no EKG changes; normal perfusion study; pt did experience chesst pain during strudy, resolved spontaneously  . DOPPLER ECHOCARDIOGRAPHY  06/25/2011   EF >55%; moderate concentric LV hypertrophy; LA mildly dilated, mild mitral annular calcification;   . KNEE SURGERY    . NECK SURGERY    . PACEMAKER INSERTION        Current Outpatient Medications  Medication Sig Dispense Refill  . aspirin 81 MG tablet Take 162 mg by mouth at bedtime.     Marland Kitchen aspirin-acetaminophen-caffeine (EXCEDRIN MIGRAINE) 250-250-65 MG per tablet Take 2 tablets by mouth daily as needed for migraine.    . carvedilol (COREG) 25 MG tablet TAKE 1 TABLET BY MOUTH TWICE DAILY WITH A MEAL 180 tablet 1  . diazepam (VALIUM) 5 MG tablet Take 1 tablet (5 mg total) by mouth at bedtime as needed for anxiety. 30 tablet 0  . estradiol (ESTRACE) 1 MG tablet Take 1.5 tablets by mouth daily.  4  . fenofibrate micronized (LOFIBRA) 134 MG capsule TAKE 1 CAPSULE BY MOUTH EVERY DAY BEFORE BREAKFAST 90 capsule 2  . imipramine (TOFRANIL) 25 MG tablet TAKE 1 TO 2 TABLETS BY MOUTH EVERY DAY 180 tablet 1  . loratadine (CLARITIN) 10 MG tablet Take 10 mg by mouth daily.     Marland Kitchen losartan (COZAAR) 100 MG tablet TAKE 1/2 TABLET(50 MG) BY MOUTH DAILY 45 tablet 4  . metFORMIN (GLUMETZA) 500 MG (MOD) 24 hr tablet Take 1,000 mg by mouth 2 (two)  times daily with a meal.     . NOVOLIN 70/30 RELION (70-30) 100 UNIT/ML injection Inject 35 Units into the skin 2 (two) times daily. Currently doing 35 units bid  0  . Potassium 99 MG TABS Take 1 tablet by mouth daily.    . rizatriptan (MAXALT) 10 MG tablet Take 1 tablet (10 mg total) by mouth as needed for migraine. May repeat in 2 hours if needed 10 tablet 0  . rosuvastatin (CRESTOR) 40 MG tablet TAKE 1 TABLET(40 MG) BY MOUTH DAILY 90 tablet 3  . spironolactone (ALDACTONE) 25 MG tablet TAKE 1 TABLET BY MOUTH TWICE DAILY 180 tablet 2  . traMADol (ULTRAM) 50 MG tablet Take 50 mg by mouth as directed.    . valACYclovir (VALTREX) 500 MG tablet TAKE 1 TABLET(500 MG) BY MOUTH DAILY 90 tablet 0  . verapamil (CALAN-SR) 180 MG CR tablet TAKE 1 TABLET(180 MG)  BY MOUTH AT BEDTIME 30 tablet 6   No current facility-administered medications for this visit.     Socially she is married and has 2 children and 2 grandchildren. There is no tobacco or alcohol use.  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 no daytime sleepiness, hypersomnolence, bruxism, restless legs, hypnogognic hallucinations, no  cataplexy Other comprehensive 14 point system review is negative.  PE BP (!) 112/58   Pulse 78   Ht 5' 7" (1.702 m)   Wt 224 lb 3.2 oz (101.7 kg)   BMI 35.11 kg/m    Repeat blood pressure by me was 122/72.  Wt Readings from Last 3 Encounters:  09/05/17 224 lb 3.2 oz (101.7 kg)  07/20/17 220 lb 12 oz (100.1 kg)  07/18/17 220 lb 12.8 oz (100.2 kg)   General: Alert, oriented, no distress.  Skin: normal turgor, no rashes, warm and dry HEENT: Normocephalic, atraumatic. Pupils equal round and reactive to light; sclera anicteric; extraocular muscles intact; Nose without nasal septal hypertrophy Mouth/Parynx benign; Mallinpatti scale 3 Neck: No JVD, no carotid bruits; normal carotid upstroke Lungs: clear to ausculatation and percussion; no wheezing or rales Chest wall: without tenderness to palpitation Heart: PMI not displaced, RRR, s1 s2 normal, 1/6 systolic murmur, no diastolic murmur, no rubs, gallops, thrills, or heaves Abdomen: soft, nontender; no hepatosplenomehaly, BS+; abdominal aorta nontender and not dilated by palpation. Back: no CVA tenderness Pulses 2+ Musculoskeletal: full range of motion, normal strength, no joint deformities Extremities: no clubbing cyanosis or edema, Homan's sign negative  Neurologic: grossly nonfocal; Cranial nerves grossly wnl Psychologic: Normal mood and affect   ECG (independently read by me): Atrially sensed, ventricular paced rhythm at 78 bpm.  No ectopy  I have personally reviewed the ECG from 03/23/2016.  She had biventricular pacing.  Ventricular rate was 72 bpm.  ECG (independently read by me): Atrially sensed rhythm and 100% ventricular pacing    January 2015 ECG (interpreted by me) : Atrial lead sensed, ventricular paced rhythm with 100% ventricular capture    LABS: BMP Latest Ref Rng & Units 07/20/2017 12/01/2016 11/22/2016  Glucose 65 - 99 mg/dL 172(H) 110(H) 82  BUN 6 - 20 mg/dL _0 Creatinine 0.44 - 1.00 mg/dL 0.98 0.93  0.85  BUN/Creat Ratio 6 - 22 (calc) - NOT APPLICABLE NOT APPLICABLE  Sodium 517 - 145 mmol/L 138 141 141  Potassium 3.5 - 5.1 mmol/L 3.9 3.9 4.6  Chloride 101 - 111 mmol/L 100(L) 104 105  CO2 22 - 32 mmol/L _1 Calcium 8.9 - 10.3 mg/dL 10.3 9.9 10.3   Hepatic Function Latest Ref Rng & Units 11/22/2016 05/13/2016 11/19/2015  Total Protein 6.1 - 8.1 g/dL 7.3 6.7 7.5  Albumin 3.6 - 5.1 g/dL - 4.0 4.6  AST 10 - 35 U/L _2 ALT 6 - 29 U/L 26 26 32(H)  Alk Phosphatase 33 - 130 U/L - 57 51  Total Bilirubin 0.2 - 1.2 mg/dL 0.5 0.5 0.6  Bilirubin, Direct 0.0 - 0.2 mg/dL 0.1 - 0.1   CBC Latest Ref Rng & Units 07/20/2017 12/01/2016 11/22/2016  WBC 4.0 - 10.5 K/uL 17.0(H) 11.7(H) 7.0  Hemoglobin 12.0 - 15.0 g/dL 15.5(H) 13.2 14.0  Hematocrit 36.0 - 46.0 % 44.4 39.1 41.3  Platelets 150 - 400 K/uL 314 289 299   Lab Results  Component Value Date   TSH 1.65 11/22/2016   Lipid Panel     Component Value Date/Time   CHOL 87 11/22/2016 0937   TRIG 113 11/22/2016 0937   HDL 30 (L) 11/22/2016 0937   CHOLHDL 2.9 11/22/2016 0937   VLDL 27 11/19/2015 0954   LDLCALC 37 11/22/2016 0937     RADIOLOGY: No results found.  IMPRESSION:  1. Nonischemic cardiomyopathy ([place])   2. Biventricular cardiac pacemaker in situ   3. Essential hypertension   4. OSA on CPAP   5. Hyperlipidemia with target LDL less than 70   6. Moderate obesity   7. Type 2 diabetes mellitus with complication, with long-term current use of insulin ([facility])     ASSESSMENT AND PLAN: Ms. Amadon is a 62 year old female with a previous history of a nonischemic cardiomyopathy with subsequent normalization of LV function following initial biventricular ICD implantation and medical therapy.  In November 2015, she underwent a generator change and downgrade to a by the permanent pacemaker rather than ICD.  She had issues with not taking her diuretic and dietary indiscretion leading to some class II heart failure symptoms.  When I last  saw her one year ago after not having seen her in 2 years she had gained moderate weight.  Over the past 5 months, she admits to a 35 pound weight loss but has gained approximately 10 pounds of that weight loss back.  Presently, she is not having any chest pain or anginal symptoms.  She denies any edema or heart failure symptoms.  There is no PND orthopnea.  Her blood pressure today is stable on her medical regimen consisting of carvedilol 25 mg twice a day, losartan 50 mg daily` in addition to her verapamil 180 mg and spironolactone 25 mg daily.  She went with a history of mixed hyperlipidemia she continues to be on rosuvastatin 40 mg and fenofibrate 134 mg daily.  She is diabetic on Novolin insulin in addition to metformin.  She continues to be on aspirin 81 mg for antiplatelet benefit.  She is moderately obese with a BMI of 35.11.  Additional weight loss and exercise was recommended.  She is due to undergo repeat blood work by her primary physician.  September 2018, lipid studies were excellent with a total cholesterol of 87, LDL 37, triglycerides 113 although HDL remains low at 30.  If triglycerides continue to be low and she continues to have improved diet may be possible to discontinue fenofibrate.  She continues to use CPAP with 100% compliance and is on her second machine since CPAP initiation.  As long as she remains stable, I will see her in 1 year for reevaluation. Troy Sine, MD, [facility]  09/06/2017 11:19 AM

## 2017-09-05 NOTE — Patient Instructions (Signed)

## 2017-09-06 ENCOUNTER — Encounter: Payer: Self-pay | Admitting: Cardiovascular Disease

## 2017-09-26 LAB — HM DIABETES EYE EXAM

## 2017-09-27 ENCOUNTER — Ambulatory Visit (INDEPENDENT_AMBULATORY_CARE_PROVIDER_SITE_OTHER): Payer: BLUE CROSS/BLUE SHIELD | Admitting: *Deleted

## 2017-09-27 DIAGNOSIS — I428 Other cardiomyopathies: Secondary | ICD-10-CM

## 2017-09-27 DIAGNOSIS — I5022 Chronic systolic (congestive) heart failure: Secondary | ICD-10-CM

## 2017-09-27 NOTE — Progress Notes (Signed)
Remote pacemaker transmission.   

## 2017-10-03 ENCOUNTER — Encounter: Payer: Self-pay | Admitting: Cardiovascular Disease

## 2017-10-03 MED ORDER — ROSUVASTATIN CALCIUM 40 MG PO TABS: ORAL_TABLET | ORAL | 3 refills | Status: DC

## 2017-10-05 ENCOUNTER — Other Ambulatory Visit: Payer: Self-pay | Admitting: Physician Assistant

## 2017-10-06 ENCOUNTER — Encounter: Payer: Self-pay | Admitting: *Deleted

## 2017-11-02 LAB — CUP PACEART REMOTE DEVICE CHECK
Brady Statistic AP VP Percent: 3.1 %
Brady Statistic AP VS Percent: 1 %
Brady Statistic AS VP Percent: 97 %
Brady Statistic AS VS Percent: 1 %
Brady Statistic RA Percent Paced: 3.1 %
Implantable Lead Implant Date: 20091016
Implantable Lead Location: 753858
Implantable Lead Location: 753860
Implantable Lead Model: 7122
Lead Channel Impedance Value: 860 Ohm
Lead Channel Pacing Threshold Amplitude: 1 V
Lead Channel Pacing Threshold Amplitude: 1.25 V
Lead Channel Pacing Threshold Pulse Width: 0.3 ms
Lead Channel Pacing Threshold Pulse Width: 0.3 ms
Lead Channel Pacing Threshold Pulse Width: 0.6 ms
Lead Channel Sensing Intrinsic Amplitude: 12 mV
Lead Channel Setting Pacing Amplitude: 2 V
Lead Channel Setting Pacing Amplitude: 2 V
Lead Channel Setting Pacing Pulse Width: 0.3 ms
Lead Channel Setting Sensing Sensitivity: 2 mV
MDC IDC LEAD IMPLANT DT: 20091016
MDC IDC LEAD IMPLANT DT: 20091016
MDC IDC LEAD LOCATION: 753859
MDC IDC MSMT BATTERY REMAINING LONGEVITY: 104 mo
MDC IDC MSMT BATTERY REMAINING PERCENTAGE: 95.5 %
MDC IDC MSMT BATTERY VOLTAGE: 2.98 V
MDC IDC MSMT LEADCHNL RA IMPEDANCE VALUE: 410 Ohm
MDC IDC MSMT LEADCHNL RA PACING THRESHOLD AMPLITUDE: 0.75 V
MDC IDC MSMT LEADCHNL RA SENSING INTR AMPL: 4.2 mV
MDC IDC MSMT LEADCHNL RV IMPEDANCE VALUE: 530 Ohm
MDC IDC PG IMPLANT DT: 20151111
MDC IDC PG SERIAL: 7688750
MDC IDC SESS DTM: 20190723082136
MDC IDC SET LEADCHNL LV PACING PULSEWIDTH: 0.6 ms
MDC IDC SET LEADCHNL RV PACING AMPLITUDE: 2.5 V

## 2017-11-04 ENCOUNTER — Other Ambulatory Visit: Payer: Self-pay | Admitting: Cardiovascular Disease

## 2017-11-04 NOTE — Telephone Encounter (Signed)
Rx sent to pharmacy   

## 2017-11-22 ENCOUNTER — Encounter: Payer: Self-pay | Admitting: Physician Assistant

## 2017-12-02 ENCOUNTER — Other Ambulatory Visit: Payer: Self-pay | Admitting: Cardiovascular Disease

## 2017-12-05 ENCOUNTER — Other Ambulatory Visit: Payer: Self-pay

## 2017-12-21 DIAGNOSIS — E1169 Type 2 diabetes mellitus with other specified complication: Secondary | ICD-10-CM | POA: Insufficient documentation

## 2017-12-21 DIAGNOSIS — E785 Hyperlipidemia, unspecified: Secondary | ICD-10-CM

## 2017-12-21 NOTE — Progress Notes (Signed)
Complete Physical  Assessment and Plan:  Essential hypertension - continue medications, DASH diet, exercise and monitor at home. Call if greater than 130/80.  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 Talmage Nap  Diabetic autonomic neuropathy associated with type 2 diabetes mellitus (HCC) -     Hemoglobin A1c -- continue weight loss - suggest cutting back on insulin 3-5 units both AM and PM dose due to low sugars - discussed low sugars and awareness, will schedule follow up Dr. Talmage Nap  Uncontrolled type 2 diabetes mellitus with hyperglycemia, with long-term current use of insulin (HCC) -     Hemoglobin A1c - continue weight loss - suggest cutting back on insulin 3-5 units both AM and PM dose due to low sugars - discussed low sugars and awareness, will schedule follow up Dr. Talmage Nap  HYPERCHOLESTEROLEMIA -     Lipid panel -continue medications, check lipids, decrease fatty foods, increase activity.   Morbid Obesity with co morbidities - long discussion about weight loss, diet, and exercise  Other migraine without status migrainosus, not intractable Avoid triggers  Gastroesophageal reflux disease, esophagitis presence not specified Continue PPI/H2 blocker, diet discussed  Diastolic dysfunction Continue weight loss  Biventricular cardiac pacemaker in situ Continue cardio  follow up  Generalized anxiety disorder Continue meds  Needs flu shot -     FLU VACCINE MDCK QUAD W/Preservative  Medication management -     Magnesium  Vitamin D deficiency -     VITAMIN D 25 Hydroxy (Vit-D Deficiency, Fractures)  Anemia, unspecified type -     Iron,Total/Total Iron Binding Cap -     Vitamin B12   Discussed med's effects and SE's. Screening labs and tests as requested with regular follow-up as recommended. Future Appointments  Date Time Provider Department Center  12/27/2017  8:55 AM [facility] DEVICE REMOTES [facility]  01/01/2019  3:00 PM Quentin Mulling, PA-C [facility] None    HPI 62 y.o. female  presents for a complete physical.  She had hematuria and has kidney stone, also had nodule on her bladder, following with Dr. Alvester Morin at [facility].  Her blood pressure has been controlled at home, today their BP is BP: 130/80 She does not workout regularly, pulled something in her back so has not last few days.  She denies chest pain, shortness of breath, dizziness.  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 Tresa Endo, follows with them regularly. She is on BB, ARB, spirolactone, and torsemide.  She is on cholesterol medication, crestor 40mg  and fenofibrate and denies myalgias. Her cholesterol is at goal. The cholesterol last visit was:   Lab Results  Component Value Date   CHOL 87 11/22/2016   HDL 30 (L) 11/22/2016   LDLCALC 37 11/22/2016   TRIG 113  11/22/2016   CHOLHDL 2.9 11/22/2016   She has been working on diet and exercise for Diabetes with diabetic chronic kidney disease, with other circulatory complications and with diabetic polyneuropathy. She follows up with Dr. Talmage Nap for her uncontrolled DM, she is on insulin, 70/30 doing 40-40 BID. She see's Dr. Hazle Quant for eye exams, she is on bASA, she is on ACE/ARB, and denies hypoglycemia , polydipsia, polyuria and visual  disturbances. Does have paresthesias in bilateral feet/legs.   Last A1C was:  Lab Results  Component Value Date   HGBA1C 7.2 (H) 11/22/2016  Patient is on Vitamin D supplement.  Lab Results  Component Value Date   VD25OH 55 11/22/2016  Has history of migraines.  She follows with Dr. Nicholas Lose, has had several seb cyst around breast.  Mobid obesity with noncompliance- BMI is Body mass index is 35.58 kg/m., she is working on diet and exercise. She has OSA and is on CPAP  Wt Readings from Last 3 Encounters:  12/22/17 227 lb 3.2 oz (103.1 kg)  09/05/17 224 lb 3.2 oz (101.7 kg)  07/20/17 220 lb 12 oz (100.1 kg)   Current Medications:  Current Outpatient Medications on File Prior to Visit  Medication Sig Dispense Refill  . aspirin 81 MG tablet Take 162 mg by mouth at bedtime.     Marland Kitchen aspirin-acetaminophen-caffeine (EXCEDRIN MIGRAINE) 250-250-65 MG per tablet Take 2 tablets by mouth daily as needed for migraine.    . carvedilol (COREG) 25 MG tablet TAKE 1 TABLET BY MOUTH TWICE DAILY WITH A MEAL 180 tablet 2  . diazepam (VALIUM) 5 MG tablet Take 1 tablet (5 mg total) by mouth at bedtime as needed for anxiety. 30 tablet 0  . estradiol (ESTRACE) 1 MG tablet Take 1.5 tablets by mouth daily.  4  . fenofibrate micronized (LOFIBRA) 134 MG capsule TAKE 1 CAPSULE BY MOUTH EVERY DAY BEFORE BREAKFAST 90 capsule 0  . imipramine (TOFRANIL) 25 MG tablet TAKE 1 TO 2 TABLETS BY MOUTH EVERY DAY 180 tablet 0  . loratadine (CLARITIN) 10 MG tablet Take 10 mg by mouth daily.     Marland Kitchen losartan (COZAAR) 100 MG tablet TAKE 1/2 TABLET(50 MG) BY MOUTH DAILY 45 tablet 4  . metFORMIN (GLUMETZA) 500 MG (MOD) 24 hr tablet Take 1,000 mg by mouth 2 (two) times daily with a meal.     . NOVOLIN 70/30 RELION (70-30) 100 UNIT/ML injection Inject 35 Units into the skin 2 (two) times daily. Currently doing 35 units bid  0  . Potassium 99 MG TABS Take 1 tablet by mouth daily.    . rizatriptan (MAXALT) 10 MG tablet Take 1 tablet (10 mg  total) by mouth as needed for migraine. May repeat in 2 hours if needed 10 tablet 0  . rosuvastatin (CRESTOR) 40 MG tablet TAKE 1 TABLET(40 MG) BY MOUTH DAILY 90 tablet 3  . spironolactone (ALDACTONE) 25 MG tablet TAKE 1 TABLET BY MOUTH TWICE DAILY 180 tablet 0  . traMADol (ULTRAM) 50 MG tablet Take 50 mg by mouth as directed.    . valACYclovir (VALTREX) 500 MG tablet TAKE 1 TABLET(500 MG) BY MOUTH DAILY 90 tablet 0  . verapamil (CALAN-SR) 180 MG CR tablet TAKE 1 TABLET(180 MG) BY MOUTH AT BEDTIME 30 tablet 6   No current facility-administered medications on file prior to visit.    Health Maintenance:   Immunization History  Administered Date(s) Administered  . Influenza Inj Mdck Quad With Preservative 11/22/2016, 12/22/2017  . Influenza-Unspecified 11/22/2014  Tetanus: 2014 Pneumovax: declines Prevnar 13: due age 69 Flu vaccine: 2018 TODAY Zostavax: N/A  Pap: Sept 2017 Dr. Chestine Spore  Normal, getting every 3 years MGM: sept 2018 has done at [facility] TOM[facility]ROW DEXA: 2006 t -1.3 DUE, declines at this time Colonoscopy: 11/09/2016  EGD: Dec 2007 Stress test 2016 Echo 05/2016 US soft tissues: 08/2013 CT AB 07/2017 Sleep study 2011  Patient Care Team: Lucky Cowboy, MD as PCP - General (Internal Medicine) Lennette Bihari, MD as Consulting Physician (Cardiology) Fletcher Anon, MD as Consulting Physician (Pediatrics) Bernette Redbird, MD as Consulting Physician (Gastroenterology) Dorisann Frames, MD as Consulting Physician (Endocrinology) Miguel Aschoff, MD as Consulting Physician (Obstetrics and Gynecology) Venancio Poisson, MD as Consulting Physician (Dermatology)   Medical History:  Past Medical History:  Diagnosis Date  . Automatic implantable cardiac defibrillator in situ    [employer] 7122  . CHF (congestive heart failure) (HCC)   . Diabetes mellitus   . Hyperlipidemia   . Hypertension 06/25/2011   Renal dopplers - superior mesenteric artery >50% diameter reduction; R  renal artery - normal patency; L proximal renal artery 1-59% reduction (lower end of scale); both kidneys normal in size/symmetry with normal cortex and medulla  . Leg pain 12/09/2010   doppler of R femoral artery - no evidence of dissection, AV fistula, pseudoaneurysm or other vascular abnormalities  . Migraines   . Obesity   . Sleep apnea    on CPAP - AHI during sleep study was 44  . Type II or unspecified type diabetes mellitus without mention of complication, not stated as uncontrolled   . Type II or unspecified type diabetes mellitus without mention of complication, not stated as uncontrolled   . Unspecified sleep apnea     Allergies  Allergen Reactions  . Flagyl [Metronidazole] Anaphylaxis  . Niacin And Related Other (See Comments) and Cough    flushing  . Penicillins Swelling    Has patient had a PCN reaction causing immediate rash, facial/tongue/throat swelling, SOB or lightheadedness with hypotension: No Has patient had a PCN reaction causing severe rash involving mucus membranes or skin necrosis: No Has patient had a PCN reaction that required hospitalization No Has patient had a PCN reaction occurring within the last 10 years: Yes 3-4 years ago If all of the above answers are "NO", then may proceed with Cephalosporin use.  . Yeast-Related Products Swelling and Rash     SURGICAL HISTORY She  has a past surgical history that includes Abdominal hysterectomy; Neck surgery; Pacemaker insertion; Cardiac defibrillator placement; Knee surgery; doppler echocardiography (06/25/2011); Cardiovascular stress test (08/28/2010); Cardiac catheterization (07/15/2006); and Biv pacemaker generator change out (N/A, 01/16/2014). FAMILY HISTORY Her family history includes Cancer in her maternal aunt, maternal grandfather, maternal uncle, and mother; Hearing loss in her father; Heart attack in her father; Hypertension in her father. SOCIAL HISTORY She  reports that she quit smoking about 42 years ago.  Her smoking use included cigarettes. She has never used smokeless tobacco. She reports that she drinks alcohol. She reports that she does not use drugs.  Review of Systems  Constitutional: Negative.   HENT: Negative.   Respiratory: Negative for cough, hemoptysis, sputum production, shortness of breath and wheezing.   Cardiovascular: Negative.   Gastrointestinal: Negative for abdominal pain, blood in stool, constipation, diarrhea, heartburn, melena, nausea and vomiting.  Genitourinary: Negative.   Musculoskeletal: Negative for back pain, falls, joint pain, myalgias and neck pain.  Skin: Negative for itching and rash.  Neurological: Negative  for dizziness, tingling, tremors, sensory change, speech change, focal weakness, seizures and loss of consciousness.  Psychiatric/Behavioral: Negative.     Physical Exam: Estimated body mass index is 35.58 kg/m as calculated from the following:   Height as of this encounter: 5\' 7"  (1.702 m).   Weight as of this encounter: 227 lb 3.2 oz (103.1 kg). BP 130/80   Pulse 83   Temp (!) 97.4 F (36.3 C)   Resp 16   Ht 5\' 7"  (1.702 m)   Wt 227 lb 3.2 oz (103.1 kg)   SpO2 97%   BMI 35.58 kg/m  General Appearance: Well nourished, in no apparent distress. Eyes: PERRLA, EOMs, conjunctiva no swelling or erythema, normal fundi and vessels. Sinuses: No Frontal/maxillary tenderness ENT/Mouth: Ext aud canals clear, normal light reflex with TMs without erythema, bulging.  Good dentition. No erythema, swelling, or exudate on post pharynx. Tonsils not swollen or erythematous. Hearing normal.  Neck: Supple, thyroid normal. No bruits Respiratory: Respiratory effort normal, BS equal bilaterally without rales, rhonchi, wheezing or stridor. Cardio: RRR without murmurs, rubs or gallops. Brisk peripheral pulses without edema, with bilateral hard palpable cords without erythema, warmth, tenderness.  Chest: symmetric, with normal excursions and percussion. Breasts:  defer Abdomen: obese, soft, nontender without organomegaly.   Lymphatics: Non tender without lymphadenopathy.  Genitourinary: defer Musculoskeletal: Full ROM all peripheral extremities,5/5 strength, and normal gait. Skin: Warm, dry without rashes, lesions, ecchymosis.  Neuro: Cranial nerves intact, reflexes equal bilaterally. Normal muscle tone, no cerebellar symptoms. Sensation decreased bilateral feet to 2/3 up shin.  Psych: Awake and oriented X 3, normal affect, Insight and Judgment appropriate.   Quentin Mulling 4:04 PM

## 2017-12-22 ENCOUNTER — Ambulatory Visit (INDEPENDENT_AMBULATORY_CARE_PROVIDER_SITE_OTHER): Payer: BLUE CROSS/BLUE SHIELD | Admitting: Physician Assistant

## 2017-12-22 ENCOUNTER — Encounter: Payer: Self-pay | Admitting: Physician Assistant

## 2017-12-22 VITALS — BP 130/80 | HR 83 | Temp 97.4°F | Resp 16 | Ht 67.0 in | Wt 227.2 lb

## 2017-12-22 DIAGNOSIS — Z79899 Other long term (current) drug therapy: Secondary | ICD-10-CM

## 2017-12-22 DIAGNOSIS — Z950 Presence of cardiac pacemaker: Secondary | ICD-10-CM

## 2017-12-22 DIAGNOSIS — Z0000 Encounter for general adult medical examination without abnormal findings: Secondary | ICD-10-CM | POA: Diagnosis not present

## 2017-12-22 DIAGNOSIS — Z1329 Encounter for screening for other suspected endocrine disorder: Secondary | ICD-10-CM | POA: Diagnosis not present

## 2017-12-22 DIAGNOSIS — Z87442 Personal history of urinary calculi: Secondary | ICD-10-CM

## 2017-12-22 DIAGNOSIS — I428 Other cardiomyopathies: Secondary | ICD-10-CM

## 2017-12-22 DIAGNOSIS — Z131 Encounter for screening for diabetes mellitus: Secondary | ICD-10-CM

## 2017-12-22 DIAGNOSIS — IMO0002 Reserved for concepts with insufficient information to code with codable children: Secondary | ICD-10-CM

## 2017-12-22 DIAGNOSIS — Z130 Encounter for screening for diseases of the blood and blood-forming organs and certain disorders involving the immune mechanism: Secondary | ICD-10-CM

## 2017-12-22 DIAGNOSIS — Z13220 Encounter for screening for lipoid disorders: Secondary | ICD-10-CM | POA: Diagnosis not present

## 2017-12-22 DIAGNOSIS — E559 Vitamin D deficiency, unspecified: Secondary | ICD-10-CM

## 2017-12-22 DIAGNOSIS — I700 Atherosclerosis of aorta: Secondary | ICD-10-CM

## 2017-12-22 DIAGNOSIS — E1143 Type 2 diabetes mellitus with diabetic autonomic (poly)neuropathy: Secondary | ICD-10-CM

## 2017-12-22 DIAGNOSIS — E1169 Type 2 diabetes mellitus with other specified complication: Secondary | ICD-10-CM

## 2017-12-22 DIAGNOSIS — Z23 Encounter for immunization: Secondary | ICD-10-CM

## 2017-12-22 DIAGNOSIS — E7800 Pure hypercholesterolemia, unspecified: Secondary | ICD-10-CM

## 2017-12-22 DIAGNOSIS — I10 Essential (primary) hypertension: Secondary | ICD-10-CM

## 2017-12-22 DIAGNOSIS — Z794 Long term (current) use of insulin: Secondary | ICD-10-CM

## 2017-12-22 DIAGNOSIS — K219 Gastro-esophageal reflux disease without esophagitis: Secondary | ICD-10-CM

## 2017-12-22 DIAGNOSIS — G43809 Other migraine, not intractable, without status migrainosus: Secondary | ICD-10-CM

## 2017-12-22 DIAGNOSIS — E1322 Other specified diabetes mellitus with diabetic chronic kidney disease: Secondary | ICD-10-CM

## 2017-12-22 DIAGNOSIS — F411 Generalized anxiety disorder: Secondary | ICD-10-CM

## 2017-12-22 DIAGNOSIS — E1165 Type 2 diabetes mellitus with hyperglycemia: Secondary | ICD-10-CM

## 2017-12-22 DIAGNOSIS — I5022 Chronic systolic (congestive) heart failure: Secondary | ICD-10-CM

## 2017-12-22 DIAGNOSIS — G4733 Obstructive sleep apnea (adult) (pediatric): Secondary | ICD-10-CM

## 2017-12-22 DIAGNOSIS — I5189 Other ill-defined heart diseases: Secondary | ICD-10-CM

## 2017-12-22 DIAGNOSIS — N182 Chronic kidney disease, stage 2 (mild): Secondary | ICD-10-CM

## 2017-12-22 DIAGNOSIS — E785 Hyperlipidemia, unspecified: Secondary | ICD-10-CM

## 2017-12-22 DIAGNOSIS — Z9989 Dependence on other enabling machines and devices: Secondary | ICD-10-CM

## 2017-12-22 DIAGNOSIS — Z0001 Encounter for general adult medical examination with abnormal findings: Secondary | ICD-10-CM

## 2017-12-22 DIAGNOSIS — E1365 Other specified diabetes mellitus with hyperglycemia: Secondary | ICD-10-CM

## 2017-12-22 NOTE — Patient Instructions (Signed)
Check out mindul eating on google  Before you eat, rate your hunger on a scale of 1-10  Check out  Mini habits for weight loss book  2 apps for tracking food is myfitness pal  loseit OR can take picture of your food  8 Critical Weight-Loss Tips That Aren't Diet and Exercise  1. STARVE THE DISTRACTIONS  All too often when we eat, we're also multitasking: watching TV, answering emails, scrolling through social media. These habits are detrimental to having a strong, clear, healthy relationship with food, and they can hinder our ability to make dietary changes.  In order to truly focus on what you're eating, how much you're eating, why you're eating those specific foods and, most importantly, how those foods make you feel, you need to starve the distractions. That means when you eat, just eat. Focus on your food, the process it went through to end up on your plate, where it came from and how it nourishes you. With this technique, you're more likely to finish a meal feeling satiated.  2.  CONSIDER WHAT YOU'RE NOT WILLING TO DO  This might sound counterintuitive, but it can help provide a "why" when motivation is waning. Declare, in writing, what you are unwilling to do, for example "I am unwilling to be the old dad who cannot play sports with my children".  So consider what you're not willing to accept, write it down, and keep it at the ready.  3.  STOP LABELING FOOD "GOOD" AND "BAD"  You've probably heard someone say they ate something "bad." Maybe you've even said it yourself.  The trouble with 'bad' foods isn't that they'll send you to the grave after a bite or two. The trouble comes when we eat excessive portions of really calorie-dense foods meal after meal, day after day.  Instead of labeling foods as good or bad, think about which foods you can eat a lot of, and which ones you should just eat a little of. Then, plan ways to eat the foods you really like in portions that fit with your  overall goals. A good example of this would be having a slice of pizza alongside a club salad with chicken breast, avocado and a bit of dressing. This is vastly different than 3 slices of pizza, 4 breadsticks with cheese sauce and half of a liter of regular soda.  4.  BRUSH YOUR TEETH AFTER YOU EAT  Getting your mindset in order is important, but sometimes small habits can make a big difference. After eating, you still have the taste of food in their mouth, which often causes people to eat more even if they are full or engage in a nibble or two of dessert.  Brushing your teeth will remove the taste of food from your mouth, and the clean, minty freshness will serve as a cue that mealtime is over.  5.  FOCUS ON CROWDING NOT CUTTING  The most common first step during 'dieting' is to cut. We cut our portion sizes down, we cut out 'bad' foods, we cut out entire food groups. This act of cutting puts [place] and our minds into scarcity mode.  When something is off-limits, even if you're able to avoid it for a while, you could end up bingeing on it later because you've gone so long without it. So, instead of cutting, focus on crowding. If you crowd your plate and fill it up with more foods like veggies and protein, it simply allows less room for  the other stuff. In other words, shift your focus away from what you can't eat, and celebrate the foods that will help you reach your goals.  6.  TAKE TRACKING A STEP FURTHER  Track what you eat, when you ate it, how much you ate and how that food made you feel. Being completely honest with yourself and writing down every single thing that passes through your lips will help you start to notice that maybe you actually do snack, possibly take in more sugar than you thought, eat when you're bored rather than just hungry or maybe that you have a habit of snacking before bed while watching TV.  The difference from simply tracking your food intake is you're taking into account  how food makes you feel, as well as what you're doing while you're eating. This is about becoming more mindful of what, when and why you eat.  7.  PRIORITIZE GOOD SLEEP  One of the strongest risk factors for being overweight is poor sleep. When you're feeling tired, you're more likely to choose unhealthy comfort foods and to skip your workout. Additionally, sleep deprivation may slow down your metabolism. Burnett Kanaris! Therefore, sleeping 7-8 hours per night can help with weight loss without having to change your diet or increase your physical activity. And if you feel you snore and still wake up tired, talk with me about sleep apnea.  8.  SET ASIDE TIME TO DISCONNECT  Just get out there. Disconnect from the electronics and connect to the elements. Not only will this help reduce stress (a major factor in weight gain) by giving your mind a break from the constant stimulation we've all become so accustomed to, but it may also reprogram your brain to connect with yourself and what you're feeling.

## 2017-12-23 LAB — COMPLETE METABOLIC PANEL WITH GFR
AG Ratio: 1.7 (calc) (ref 1.0–2.5)
ALT: 26 U/L (ref 6–29)
AST: 21 U/L (ref 10–35)
Albumin: 4.4 g/dL (ref 3.6–5.1)
Alkaline phosphatase (APISO): 58 U/L (ref 33–130)
BUN/Creatinine Ratio: 16 (calc) (ref 6–22)
BUN: 17 mg/dL (ref 7–25)
CO2: 29 mmol/L (ref 20–32)
Calcium: 10.3 mg/dL (ref 8.6–10.4)
Chloride: 100 mmol/L (ref 98–110)
Creat: 1.08 mg/dL — ABNORMAL HIGH (ref 0.50–0.99)
GFR, Est African American: 64 mL/min/{1.73_m2} (ref >=60)
GFR, Est Non African American: 55 mL/min/{1.73_m2} — ABNORMAL LOW (ref >=60)
Globulin: 2.6 g/dL (calc) (ref 1.9–3.7)
Glucose, Bld: 356 mg/dL — ABNORMAL HIGH (ref 65–99)
Potassium: 4.9 mmol/L (ref 3.5–5.3)
Sodium: 138 mmol/L (ref 135–146)
Total Bilirubin: 0.4 mg/dL (ref 0.2–1.2)
Total Protein: 7 g/dL (ref 6.1–8.1)

## 2017-12-23 LAB — LIPID PANEL
CHOL/HDL RATIO: 3.5 (calc) (ref <5.0)
Cholesterol: 97 mg/dL (ref <200)
HDL: 28 mg/dL — ABNORMAL LOW (ref >50)
LDL CHOLESTEROL (CALC): 44 mg/dL
NON-HDL CHOLESTEROL (CALC): 69 mg/dL (ref <130)
Triglycerides: 170 mg/dL — ABNORMAL HIGH (ref <150)

## 2017-12-23 LAB — HEMOGLOBIN A1C
HEMOGLOBIN A1C: 10.8 %{Hb} — AB (ref <5.7)
MEAN PLASMA GLUCOSE: 263 (calc)
eAG (mmol/L): 14.6 (calc)

## 2017-12-23 LAB — CBC WITH DIFFERENTIAL/PLATELET
BASOS PCT: 0.8 %
Basophils Absolute: 76 cells/uL (ref 0–200)
EOS ABS: 219 {cells}/uL (ref 15–500)
Eosinophils Relative: 2.3 %
HCT: 40.8 % (ref 35.0–45.0)
Hemoglobin: 13.9 g/dL (ref 11.7–15.5)
Lymphs Abs: 3553 cells/uL (ref 850–3900)
MCH: 30.8 pg (ref 27.0–33.0)
MCHC: 34.1 g/dL (ref 32.0–36.0)
MCV: 90.5 fL (ref 80.0–100.0)
MONOS PCT: 5.6 %
MPV: 10.4 fL (ref 7.5–12.5)
NEUTROS PCT: 53.9 %
Neutro Abs: 5121 cells/uL (ref 1500–7800)
PLATELETS: 299 10*3/uL (ref 140–400)
RBC: 4.51 10*6/uL (ref 3.80–5.10)
RDW: 12.7 % (ref 11.0–15.0)
TOTAL LYMPHOCYTE: 37.4 %
WBC mixed population: 532 cells/uL (ref 200–950)
WBC: 9.5 10*3/uL (ref 3.8–10.8)

## 2017-12-23 LAB — TSH: TSH: 1.99 mIU/L (ref 0.40–4.50)

## 2017-12-23 LAB — MAGNESIUM: Magnesium: 1.4 mg/dL — ABNORMAL LOW (ref 1.5–2.5)

## 2017-12-23 LAB — IRON, TOTAL/TOTAL IRON BINDING CAP
%SAT: 15 % (calc) — ABNORMAL LOW (ref 16–45)
Iron: 66 ug/dL (ref 45–160)
TIBC: 429 mcg/dL (calc) (ref 250–450)

## 2017-12-23 LAB — VITAMIN B12: Vitamin B-12: 453 pg/mL (ref 200–1100)

## 2017-12-23 LAB — VITAMIN D 25 HYDROXY (VIT D DEFICIENCY, FRACTURES): Vit D, 25-Hydroxy: 42 ng/mL (ref 30–100)

## 2017-12-26 ENCOUNTER — Other Ambulatory Visit: Payer: Self-pay | Admitting: Obstetrics

## 2017-12-26 DIAGNOSIS — R928 Other abnormal and inconclusive findings on diagnostic imaging of breast: Secondary | ICD-10-CM

## 2017-12-27 ENCOUNTER — Ambulatory Visit (INDEPENDENT_AMBULATORY_CARE_PROVIDER_SITE_OTHER): Payer: BLUE CROSS/BLUE SHIELD | Admitting: *Deleted

## 2017-12-27 DIAGNOSIS — I428 Other cardiomyopathies: Secondary | ICD-10-CM | POA: Diagnosis not present

## 2017-12-27 NOTE — Progress Notes (Signed)
Remote pacemaker transmission.   

## 2017-12-28 ENCOUNTER — Encounter: Payer: Self-pay | Admitting: Cardiology

## 2017-12-30 ENCOUNTER — Ambulatory Visit
Admission: RE | Admit: 2017-12-30 | Discharge: 2017-12-30 | Disposition: A | Discharge status: Home / Self Care | Payer: BLUE CROSS/BLUE SHIELD | Source: Ambulatory Visit | Attending: Obstetrics | Admitting: Obstetrics

## 2017-12-30 ENCOUNTER — Other Ambulatory Visit: Payer: BLUE CROSS/BLUE SHIELD

## 2017-12-30 ENCOUNTER — Other Ambulatory Visit: Payer: Self-pay | Admitting: Obstetrics

## 2017-12-30 DIAGNOSIS — R928 Other abnormal and inconclusive findings on diagnostic imaging of breast: Secondary | ICD-10-CM

## 2017-12-30 DIAGNOSIS — N6320 Unspecified lump in the left breast, unspecified quadrant: Secondary | ICD-10-CM

## 2018-01-02 ENCOUNTER — Other Ambulatory Visit: Payer: Self-pay | Admitting: Cardiovascular Disease

## 2018-01-02 ENCOUNTER — Other Ambulatory Visit: Payer: Self-pay | Admitting: Internal Medicine

## 2018-01-02 ENCOUNTER — Other Ambulatory Visit: Payer: Self-pay | Admitting: Physician Assistant

## 2018-01-02 DIAGNOSIS — I10 Essential (primary) hypertension: Secondary | ICD-10-CM

## 2018-01-06 ENCOUNTER — Ambulatory Visit
Admission: RE | Admit: 2018-01-06 | Discharge: 2018-01-06 | Disposition: A | Discharge status: Home / Self Care | Payer: BLUE CROSS/BLUE SHIELD | Source: Ambulatory Visit | Attending: Obstetrics | Admitting: Obstetrics

## 2018-01-06 ENCOUNTER — Other Ambulatory Visit: Payer: Self-pay | Admitting: Obstetrics

## 2018-01-06 DIAGNOSIS — N6320 Unspecified lump in the left breast, unspecified quadrant: Secondary | ICD-10-CM

## 2018-02-05 ENCOUNTER — Other Ambulatory Visit: Payer: Self-pay | Admitting: Cardiovascular Disease

## 2018-02-26 LAB — CUP PACEART REMOTE DEVICE CHECK
Battery Remaining Longevity: 104 mo
Brady Statistic AP VP Percent: 2.6 %
Brady Statistic AP VS Percent: 1 %
Brady Statistic AS VP Percent: 97 %
Brady Statistic AS VS Percent: 1 %
Brady Statistic RA Percent Paced: 2.6 %
Implantable Lead Implant Date: 20091016
Implantable Lead Location: 753858
Implantable Lead Location: 753860
Implantable Lead Model: 7122
Implantable Pulse Generator Implant Date: 20151111
Lead Channel Impedance Value: 410 Ohm
Lead Channel Impedance Value: 530 Ohm
Lead Channel Impedance Value: 850 Ohm
Lead Channel Pacing Threshold Amplitude: 1 V
Lead Channel Pacing Threshold Amplitude: 1.25 V
Lead Channel Pacing Threshold Pulse Width: 0.3 ms
Lead Channel Sensing Intrinsic Amplitude: 12 mV
Lead Channel Setting Pacing Amplitude: 2 V
Lead Channel Setting Pacing Pulse Width: 0.3 ms
Lead Channel Setting Sensing Sensitivity: 2 mV
MDC IDC LEAD IMPLANT DT: 20091016
MDC IDC LEAD IMPLANT DT: 20091016
MDC IDC LEAD LOCATION: 753859
MDC IDC MSMT BATTERY REMAINING PERCENTAGE: 95.5 %
MDC IDC MSMT BATTERY VOLTAGE: 2.98 V
MDC IDC MSMT LEADCHNL LV PACING THRESHOLD PULSEWIDTH: 0.6 ms
MDC IDC MSMT LEADCHNL RA PACING THRESHOLD AMPLITUDE: 0.75 V
MDC IDC MSMT LEADCHNL RA SENSING INTR AMPL: 3.4 mV
MDC IDC MSMT LEADCHNL RV PACING THRESHOLD PULSEWIDTH: 0.3 ms
MDC IDC PG SERIAL: 7688750
MDC IDC SESS DTM: 20191021231255
MDC IDC SET LEADCHNL LV PACING PULSEWIDTH: 0.6 ms
MDC IDC SET LEADCHNL RA PACING AMPLITUDE: 2 V
MDC IDC SET LEADCHNL RV PACING AMPLITUDE: 2.5 V

## 2018-02-28 ENCOUNTER — Ambulatory Visit (INDEPENDENT_AMBULATORY_CARE_PROVIDER_SITE_OTHER): Payer: BLUE CROSS/BLUE SHIELD | Admitting: Physician Assistant

## 2018-02-28 ENCOUNTER — Encounter: Payer: Self-pay | Admitting: Physician Assistant

## 2018-02-28 VITALS — BP 122/72 | HR 78 | Wt 227.8 lb

## 2018-02-28 DIAGNOSIS — I10 Essential (primary) hypertension: Secondary | ICD-10-CM | POA: Diagnosis not present

## 2018-02-28 DIAGNOSIS — E119 Type 2 diabetes mellitus without complications: Secondary | ICD-10-CM

## 2018-02-28 DIAGNOSIS — R0609 Other forms of dyspnea: Secondary | ICD-10-CM | POA: Diagnosis not present

## 2018-02-28 DIAGNOSIS — Z950 Presence of cardiac pacemaker: Secondary | ICD-10-CM

## 2018-02-28 DIAGNOSIS — G4733 Obstructive sleep apnea (adult) (pediatric): Secondary | ICD-10-CM | POA: Diagnosis not present

## 2018-02-28 DIAGNOSIS — Z9989 Dependence on other enabling machines and devices: Secondary | ICD-10-CM

## 2018-02-28 DIAGNOSIS — Z794 Long term (current) use of insulin: Secondary | ICD-10-CM

## 2018-02-28 DIAGNOSIS — E785 Hyperlipidemia, unspecified: Secondary | ICD-10-CM

## 2018-02-28 DIAGNOSIS — R002 Palpitations: Secondary | ICD-10-CM

## 2018-02-28 MED ORDER — VERAPAMIL HCL ER 240 MG PO TBCR: EXTENDED_RELEASE_TABLET | ORAL | 1 refills | Status: DC

## 2018-02-28 NOTE — Progress Notes (Signed)
Cardiology Office Note    Date:  03/02/2018   ID:  [name], DOB 04-Feb-1956, MRN [id number]  PCP:  Lucky Cowboy, MD  Cardiologist:  Dr. Tresa Endo  Chief Complaint  Patient presents with  . Follow-up    seen for Dr. Tresa Endo.     History of Present Illness:  [name] is a 62 y.o. female with PMH of NICM s/p BiV ICD --> down grade to BiV PPM 2015, OSA on CPAP, HTN, HLD, and DM II. previous echocardiogram in April 2013 showed EF normalized to greater than 55%.  Last echocardiogram obtained on 05/07/2016 showed EF 55 to 60%, no regional wall motion abnormality, grade 2 DD.  Patient was last seen by Dr. Tresa Endo on 09/05/2017, at which time she was doing quite well.  Patient presents today for cardiology office visit.  For the past 2 weeks, she has been noticing increasing dyspnea on exertion and occasional palpitation that take her breath away.  She denies any chest pain.  Her palpitation occurs at any time.  She was having palpitation in the clinic, therefore we hooked her up to the EKG machine.  Every time she had a palpitation and episodic dyspnea, she had a PVC.  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  She is on the maximum dose of carvedilol, I will increase her verapamil to 240 mg daily.  Given her history of a nonischemic cardiomyopathy, I recommended a repeat echocardiogram.  I also will refer her to the device clinic to do another interrogation to make sure she is not having any prolonged ventricular ectopy.  She denies any recent dizziness or feeling of passing out.    Past Medical History:  Diagnosis Date  . Automatic implantable cardiac defibrillator in situ    ST. JUDE MODEL 7122  . CHF (congestive heart failure) (HCC)   . Diabetes mellitus   . Hyperlipidemia   . Hypertension 06/25/2011   Renal dopplers - superior mesenteric artery >50% diameter reduction; R renal artery - normal patency; L proximal renal artery 1-59% reduction (lower end of scale); both kidneys  normal in size/symmetry with normal cortex and medulla  . Leg pain 12/09/2010   doppler of R femoral artery - no evidence of dissection, AV fistula, pseudoaneurysm or other vascular abnormalities  . Migraines   . Obesity   . Sleep apnea    on CPAP - AHI during sleep study was 44  . Type II or unspecified type diabetes mellitus without mention of complication, not stated as uncontrolled   . Type II or unspecified type diabetes mellitus without mention of complication, not stated as uncontrolled   . Unspecified sleep apnea     Past Surgical History:  Procedure Laterality Date  . ABDOMINAL HYSTERECTOMY    . BIV PACEMAKER GENERATOR CHANGE OUT N/A 01/16/2014   Procedure: BIV PACEMAKER GENERATOR CHANGE OUT;  Surgeon: Marinus Maw, MD;  Location: [facility] CATH LAB;  Service: Cardiovascular;  Laterality: N/A;  . CARDIAC CATHETERIZATION  07/15/2006   no significant CAD by cardiac cath, confirmed by intravascular [place] of LAD; non-ischemic cardiomyopathy prob related to uncontrolled hypertension, DM and morbid obesity; moderate pulmonary hypertension; preserved cardiac output and cardiac index  . CARDIAC DEFIBRILLATOR PLACEMENT     ICD by Dr. Ladona Ridgel  . CARDIOVASCULAR STRESS TEST  08/28/2010   R/P MV - pattern of normal perfusion in all regions, no scintigraphic evidence of inducible myocardial ischemia; no EKG changes; normal perfusion study; pt did experience chesst pain  during strudy, resolved spontaneously  . DOPPLER ECHOCARDIOGRAPHY  06/25/2011   EF >55%; moderate concentric LV hypertrophy; LA mildly dilated, mild mitral annular calcification;   . KNEE SURGERY    . NECK SURGERY    . PACEMAKER INSERTION      Current Medications: Outpatient Medications Prior to Visit  Medication Sig Dispense Refill  . aspirin 81 MG tablet Take 162 mg by mouth at bedtime.     . carvedilol (COREG) 25 MG tablet TAKE 1 TABLET BY MOUTH TWICE DAILY WITH A MEAL 180 tablet 2  . diazepam (VALIUM) 5 MG tablet TAKE 1 TABLET BY  MOUTH AT BEDTIME AS NEEDED FOR ANXIETY 30 tablet 0  . estradiol (ESTRACE) 1 MG tablet Take 1.5 tablets by mouth daily.  4  . fenofibrate micronized (LOFIBRA) 134 MG capsule TAKE 1 CAPSULE BY MOUTH EVERY DAY BEFORE BREAKFAST 90 capsule 2  . imipramine (TOFRANIL) 25 MG tablet TAKE 1 TO 2 TABLETS BY MOUTH EVERY DAY 180 tablet 0  . loratadine (CLARITIN) 10 MG tablet Take 10 mg by mouth daily.     Marland Kitchen losartan (COZAAR) 100 MG tablet TAKE 1/2 TABLET(50 MG) BY MOUTH DAILY 45 tablet 4  . metFORMIN (GLUMETZA) 500 MG (MOD) 24 hr tablet Take 1,000 mg by mouth 2 (two) times daily with a meal.     . NOVOLIN 70/30 RELION (70-30) 100 UNIT/ML injection Inject 45 Units into the skin 2 (two) times daily. Currently doing 35 units bid  0  . Potassium 99 MG TABS Take 1 tablet by mouth daily.    . rizatriptan (MAXALT) 10 MG tablet Take 1 tablet (10 mg total) by mouth as needed for migraine. May repeat in 2 hours if needed 10 tablet 0  . rosuvastatin (CRESTOR) 40 MG tablet TAKE 1 TABLET(40 MG) BY MOUTH DAILY 90 tablet 3  . spironolactone (ALDACTONE) 25 MG tablet TAKE 1 TABLET BY MOUTH TWICE DAILY 180 tablet 2  . traMADol (ULTRAM) 50 MG tablet Take 50 mg by mouth as directed.    . valACYclovir (VALTREX) 500 MG tablet TAKE 1 TABLET(500 MG) BY MOUTH DAILY 90 tablet 0  . verapamil (CALAN-SR) 180 MG CR tablet TAKE 1 TABLET(180 MG) BY MOUTH AT BEDTIME 30 tablet 9  . aspirin-acetaminophen-caffeine (EXCEDRIN MIGRAINE) 250-250-65 MG per tablet Take 2 tablets by mouth daily as needed for migraine.     No facility-administered medications prior to visit.      Allergies:   Flagyl [metronidazole]; Niacin and related; Penicillins; and Yeast-related products   Social History   Socioeconomic History  . Marital status: Married    Spouse name: Not on file  . Number of children: Not on file  . Years of education: Not on file  . Highest education level: Not on file  Occupational History  . Not on file  Social Needs  . Financial  resource strain: Not on file  . Food insecurity:    Worry: Not on file    Inability: Not on file  . Transportation needs:    Medical: Not on file    Non-medical: Not on file  Tobacco Use  . Smoking status: Former Smoker    Types: Cigarettes    Last attempt to quit: 03/09/1975    Years since quitting: 43.0  . Smokeless tobacco: Never Used  Substance and Sexual Activity  . Alcohol use: Yes    Comment: Once a month.   . Drug use: No  . Sexual activity: Not on file  Lifestyle  .  Physical activity:    Days per week: Not on file    Minutes per session: Not on file  . Stress: Not on file  Relationships  . Social connections:    Talks on phone: Not on file    Gets together: Not on file    Attends religious service: Not on file    Active member of club or organization: Not on file    Attends meetings of clubs or organizations: Not on file    Relationship status: Not on file  Other Topics Concern  . 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   ROS:   Please see the history of present illness.    ROS All other systems reviewed and are negative.   PHYSICAL EXAM:   VS:  BP 122/72   Pulse 78   Wt 227 lb 12.8 oz (103.3 kg)   BMI 35.68 kg/m    GEN: Well nourished, well developed, in no acute distress  HEENT: normal  Neck: no JVD, carotid bruits, or masses Cardiac: RRR; no murmurs, rubs, or gallops,no edema  Respiratory:  clear to auscultation bilaterally, normal work of breathing GI: soft, nontender, nondistended, + BS MS: no deformity or atrophy  Skin: warm and dry, no rash Neuro:  Alert and Oriented x 3, Strength and sensation are intact Psych: euthymic mood, full affect  Wt Readings from Last 3 Encounters:    02/28/18 227 lb 12.8 oz (103.3 kg)  12/22/17 227 lb 3.2 oz (103.1 kg)  09/05/17 224 lb 3.2 oz (101.7 kg)      Studies/Labs Reviewed:   EKG:  EKG is ordered today.  The ekg ordered today demonstrates normal sinus rhythm, left bundle branch block, overall low voltage  Recent Labs: 12/22/2017: ALT 26; BUN 17; Creat 1.08; Hemoglobin 13.9; Magnesium 1.4; Platelets 299; Potassium 4.9; Sodium 138; TSH 1.99   Lipid Panel    Component Value Date/Time   CHOL 97 12/22/2017 1635   TRIG 170 (H) 12/22/2017 1635   HDL 28 (L) 12/22/2017 1635   CHOLHDL 3.5 12/22/2017 1635   VLDL 27 11/19/2015 0954   LDLCALC 44 12/22/2017 1635    Additional studies/ records that were reviewed today include:   Echo 05/07/2016 LV EF: 55% -   60% Study Conclusions  - Left ventricle: The cavity size was normal. Systolic function was   normal. The estimated ejection fraction was in the range of 55%   to 60%. Wall motion was normal; there were no regional wall   motion abnormalities. Features are consistent with a pseudonormal   left ventricular filling pattern, with concomitant abnormal   relaxation and increased filling pressure (grade 2 diastolic   dysfunction). - Left atrium: The atrium was moderately dilated. - Pericardium, extracardiac: A small pericardial effusion was   identified. There was no evidence of hemodynamic compromise.     ASSESSMENT:    1. Dyspnea on exertion   2. Essential hypertension   3. Presence of cardiac resynchronization therapy pacemaker (CRT-P)   4. OSA on CPAP   5. Hyperlipidemia, unspecified hyperlipidemia type   6. Controlled type 2 diabetes mellitus without complication, with long-term current use of insulin (HCC)   7. Palpitation      PLAN:  In order of problems  listed above:  1. Dyspnea on exertion: Given her history of nonischemic cardiomyopathy, I recommended repeat echocardiogram  2. Palpitation: She was having symptom in the clinic, EKG demonstrated PVC  every time she was having the symptom.  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  We will increase her verapamil to 240 mg daily  3. Hypertension: Blood pressure stable on current therapy  4. Hyperlipidemia: On Crestor 40 mg daily  5. DM2: On insulin, managed by primary care provider    Medication Adjustments/Labs and Tests Ordered: Current medicines are reviewed at length with the patient today.  Concerns regarding medicines are outlined above.  Medication changes, Labs and Tests ordered today are listed in the Patient Instructions below. Patient Instructions  Medication Instructions:  Increase Verapamil to 240 mg daily at bedtime. If you need a refill on your cardiac medications before your next appointment, please call your pharmacy.   Testing/Procedures: Echocardiogram - Your physician has requested that you have an echocardiogram. Echocardiography is a painless test that uses sound waves to create images of your heart. It provides your doctor with information about the size and shape of your heart and how well your heart's chambers and valves are working. This procedure takes approximately one hour. There are no restrictions for this procedure. This will be performed at our [facility] location - [address].   Follow-Up: At [facility], you and your health needs are our priority.  As part of our continuing mission to provide you with exceptional heart care, we have created designated Provider Care Teams.  These Care Teams include your primary Cardiologist (physician) and Advanced Practice Providers (APPs -  Physician Assistants and Nurse Practitioners) who all work together to provide you with the care you need, when you need it. You will need a follow up appointment in 3 weeks.  Please call our office 2 months in advance to schedule this appointment.  You may see Dr.Kelly or one of the following Advanced Practice Providers on your designated Care Team: Azalee Course, [place] . Micah Flesher, PA-C  Any Other Special Instructions Will Be Listed Below (If Applicable). Needs Device Clinic Appointment       Signed, Azalee Course, [place]  03/02/2018 11:48 PM    [facility] [address] [place], [place], [place]  [address] Phone: [phone]; Fax: [phone]

## 2018-02-28 NOTE — Patient Instructions (Signed)
Medication Instructions:  Increase Verapamil to 240 mg daily at bedtime. If you need a refill on your cardiac medications before your next appointment, please call your pharmacy.   Testing/Procedures: Echocardiogram - Your physician has requested that you have an echocardiogram. Echocardiography is a painless test that uses sound waves to create images of your heart. It provides your doctor with information about the size and shape of your heart and how well your heart's chambers and valves are working. This procedure takes approximately one hour. There are no restrictions for this procedure. This will be performed at our [facility] location - [address].   Follow-Up: At [facility], you and your health needs are our priority.  As part of our continuing mission to provide you with exceptional heart care, we have created designated Provider Care Teams.  These Care Teams include your primary Cardiologist (physician) and Advanced Practice Providers (APPs -  Physician Assistants and Nurse Practitioners) who all work together to provide you with the care you need, when you need it. You will need a follow up appointment in 3 weeks.  Please call our office 2 months in advance to schedule this appointment.  You may see Dr.Kelly or one of the following Advanced Practice Providers on your designated Care Team: Azalee Course, [place] . Micah Flesher, PA-C  Any Other Special Instructions Will Be Listed Below (If Applicable). Needs Device Clinic Appointment

## 2018-03-02 ENCOUNTER — Encounter: Payer: Self-pay | Admitting: Physician Assistant

## 2018-03-03 ENCOUNTER — Other Ambulatory Visit (HOSPITAL_COMMUNITY): Payer: BLUE CROSS/BLUE SHIELD

## 2018-03-07 ENCOUNTER — Other Ambulatory Visit: Payer: Self-pay

## 2018-03-07 ENCOUNTER — Ambulatory Visit (HOSPITAL_COMMUNITY): Payer: BLUE CROSS/BLUE SHIELD | Attending: Cardiology

## 2018-03-07 DIAGNOSIS — I10 Essential (primary) hypertension: Secondary | ICD-10-CM

## 2018-03-07 DIAGNOSIS — R0609 Other forms of dyspnea: Secondary | ICD-10-CM

## 2018-03-14 ENCOUNTER — Encounter: Payer: Self-pay | Admitting: Internal Medicine

## 2018-03-15 ENCOUNTER — Encounter: Payer: Self-pay | Admitting: Internal Medicine

## 2018-03-15 ENCOUNTER — Ambulatory Visit (INDEPENDENT_AMBULATORY_CARE_PROVIDER_SITE_OTHER): Payer: BLUE CROSS/BLUE SHIELD | Admitting: Internal Medicine

## 2018-03-15 VITALS — BP 126/82 | HR 78 | Ht 67.0 in | Wt 228.0 lb

## 2018-03-15 DIAGNOSIS — R002 Palpitations: Secondary | ICD-10-CM | POA: Diagnosis not present

## 2018-03-15 DIAGNOSIS — Z950 Presence of cardiac pacemaker: Secondary | ICD-10-CM | POA: Diagnosis not present

## 2018-03-15 DIAGNOSIS — I428 Other cardiomyopathies: Secondary | ICD-10-CM | POA: Diagnosis not present

## 2018-03-15 DIAGNOSIS — I5022 Chronic systolic (congestive) heart failure: Secondary | ICD-10-CM

## 2018-03-15 NOTE — Patient Instructions (Signed)

## 2018-03-15 NOTE — Progress Notes (Signed)
HPI [name] returns today for followup. She is a very pleasant 63 year old woman with a nonischemic cardiomyopathy, chronic systolic heart failure, and left bundle branch block, who underwent biventricular ICD implantation and had normalization of her left ventricular function, s/p downgrade to a BiV PPM. In the interim she has beenwell with no chest pain or sob. She had lost 30 lbs. In the interim she has been bothered by palpitations. Her verapamil was uptitrated and her palpitations are improved. The patient has not had edema.  Allergies  Allergen Reactions  . Flagyl [Metronidazole] Anaphylaxis  . Niacin And Related Other (See Comments) and Cough    flushing  . Penicillins Swelling    Has patient had a PCN reaction causing immediate rash, facial/tongue/throat swelling, SOB or lightheadedness with hypotension: No Has patient had a PCN reaction causing severe rash involving mucus membranes or skin necrosis: No Has patient had a PCN reaction that required hospitalization No Has patient had a PCN reaction occurring within the last 10 years: Yes 3-4 years ago If all of the above answers are "NO", then may proceed with Cephalosporin use.  Marland Kitchen Yeast-Related Products Swelling and Rash     Current Outpatient Medications  Medication Sig Dispense Refill  . aspirin 81 MG tablet Take 162 mg by mouth at bedtime.     Marland Kitchen aspirin-acetaminophen-caffeine (EXCEDRIN MIGRAINE) 250-250-65 MG per tablet Take 2 tablets by mouth daily as needed for migraine.    . carvedilol (COREG) 25 MG tablet TAKE 1 TABLET BY MOUTH TWICE DAILY WITH A MEAL 180 tablet 2  . diazepam (VALIUM) 5 MG tablet TAKE 1 TABLET BY MOUTH AT BEDTIME AS NEEDED FOR ANXIETY 30 tablet 0  . estradiol (ESTRACE) 1 MG tablet Take 1.5 tablets by mouth daily.  4  . fenofibrate micronized (LOFIBRA) 134 MG capsule TAKE 1 CAPSULE BY MOUTH EVERY DAY BEFORE BREAKFAST 90 capsule 2  . imipramine (TOFRANIL) 25 MG tablet TAKE 1 TO 2 TABLETS BY MOUTH  EVERY DAY 180 tablet 0  . loratadine (CLARITIN) 10 MG tablet Take 10 mg by mouth daily.     Marland Kitchen losartan (COZAAR) 100 MG tablet TAKE 1/2 TABLET(50 MG) BY MOUTH DAILY 45 tablet 4  . metFORMIN (GLUMETZA) 500 MG (MOD) 24 hr tablet Take 1,000 mg by mouth 2 (two) times daily with a meal.     . NOVOLIN 70/30 RELION (70-30) 100 UNIT/ML injection Inject 45 Units into the skin 2 (two) times daily. Currently doing 35 units bid  0  . Potassium 99 MG TABS Take 1 tablet by mouth daily.    . rizatriptan (MAXALT) 10 MG tablet Take 1 tablet (10 mg total) by mouth as needed for migraine. May repeat in 2 hours if needed 10 tablet 0  . rosuvastatin (CRESTOR) 40 MG tablet TAKE 1 TABLET(40 MG) BY MOUTH DAILY 90 tablet 3  . spironolactone (ALDACTONE) 25 MG tablet TAKE 1 TABLET BY MOUTH TWICE DAILY 180 tablet 2  . traMADol (ULTRAM) 50 MG tablet Take 50 mg by mouth as directed.    . valACYclovir (VALTREX) 500 MG tablet TAKE 1 TABLET(500 MG) BY MOUTH DAILY 90 tablet 0  . verapamil (CALAN-SR) 240 MG CR tablet TAKE 1 TABLET(240mg ) BY MOUTH AT BEDTIME 90 tablet 1   No current facility-administered medications for this visit.      Past Medical History:  Diagnosis Date  . Automatic implantable cardiac defibrillator in situ    ST. JUDE MODEL 7122  . CHF (congestive heart failure) (HCC)   .  Diabetes mellitus   . Hyperlipidemia   . Hypertension 06/25/2011   Renal dopplers - superior mesenteric artery >50% diameter reduction; R renal artery - normal patency; L proximal renal artery 1-59% reduction (lower end of scale); both kidneys normal in size/symmetry with normal cortex and medulla  . Leg pain 12/09/2010   doppler of R femoral artery - no evidence of dissection, AV fistula, pseudoaneurysm or other vascular abnormalities  . Migraines   . Obesity   . Sleep apnea    on CPAP - AHI during sleep study was 44  . Type II or unspecified type diabetes mellitus without mention of complication, not stated as uncontrolled   .  Type II or unspecified type diabetes mellitus without mention of complication, not stated as uncontrolled   . Unspecified sleep apnea     ROS:   All systems reviewed and negative except as noted in the HPI.   Past Surgical History:  Procedure Laterality Date  . ABDOMINAL HYSTERECTOMY    . BIV PACEMAKER GENERATOR CHANGE OUT N/A 01/16/2014   Procedure: BIV PACEMAKER GENERATOR CHANGE OUT;  Surgeon: Marinus Maw, MD;  Location[facility] C[facility] LAB;  Service: Cardiovascular;  Laterality: N/A;  . CARDIAC CATHETERIZATION  07/15/2006   no significant CAD by cardiac cath, confirmed by intravascular [place] of LAD; non-ischemic cardiomyopathy prob related to uncontrolled hypertension, DM and morbid obesity; moderate pulmonary hypertension; preserved cardiac output and cardiac index  . CARDIAC DEFIBRILLATOR PLACEMENT     ICD by Dr. Ladona Ridgel  . CARDIOVASCULAR STRESS TEST  08/28/2010   R/P MV - pattern of normal perfusion in all regions, no scintigraphic evidence of inducible myocardial ischemia; no EKG changes; normal perfusion study; pt did experience chesst pain during strudy, resolved spontaneously  . DOPPLER ECHOCARDIOGRAPHY  06/25/2011   EF >55%; moderate concentric LV hypertrophy; LA mildly dilated, mild mitral annular calcification;   . KNEE SURGERY    . NECK SURGERY    . PACEMAKER INSERTION       Family History  Problem Relation Age of Onset  . Heart attack Father   . Hearing loss Father   . Hypertension Father   . Cancer Mother   . Cancer Maternal Aunt        colon  . Cancer Maternal Uncle        colon  . Cancer Maternal Grandfather        colon cancer     Social History   Socioeconomic History  . Marital status: Married    Spouse name: Not on file  . Number of children: Not on file  . Years of education: Not on file  . Highest education level: Not on file  Occupational History  . Not on file  Social Needs  . Financial resource strain: Not on file  . Food insecurity:    Worry: Not  on file    Inability: Not on file  . Transportation needs:    Medical: Not on file    Non-medical: Not on file  Tobacco Use  . Smoking status: Former Smoker    Types: Cigarettes    Last attempt to quit: 03/09/1975    Years since quitting: 43.0  . Smokeless tobacco: Never Used  Substance and Sexual Activity  . Alcohol use: Yes    Comment: Once a month.   . Drug use: No  . Sexual activity: Not on file  Lifestyle  . Physical activity:    Days per week: Not on file    Minutes  per session: Not on file  . Stress: Not on file  Relationships  . Social connections:    Talks on phone: Not on file    Gets together: Not on file    Attends religious service: Not on file    Active member of club or organization: Not on file    Attends meetings of clubs or organizations: Not on file    Relationship status: Not on file  . Intimate partner violence:    Fear of current or ex partner: Not on file    Emotionally abused: Not on file    Physically abused: Not on file    Forced sexual activity: Not on file  Other Topics Concern  . 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  See PaceArt for details.   Assess/Plan: 1. Chronic systolic heart failure - Her symptoms are class 2. She will continue her current meds.  2. Biv PPM - her St. Jude device is working normally. We will  recheck in several months. 3. Obesity - I have encouraged her to work on losing more weight.  4. HTN - her blood pressure is well controlled. She has tolerated uptitration of her verapamil.  Leonia ReevesGregg Taylor,M.D.

## 2018-03-16 LAB — CUP PACEART INCLINIC DEVICE CHECK
Battery Remaining Longevity: 97 mo
Battery Voltage: 2.98 V
Brady Statistic RA Percent Paced: 2.2 %
Brady Statistic RV Percent Paced: 99.9 %
Implantable Lead Implant Date: 20091016
Implantable Lead Implant Date: 20091016
Implantable Lead Location: 753858
Implantable Lead Location: 753860
Implantable Lead Model: 4196
Lead Channel Impedance Value: 400 Ohm
Lead Channel Impedance Value: 487.5 Ohm
Lead Channel Impedance Value: 837.5 Ohm
Lead Channel Pacing Threshold Amplitude: 0.5 V
Lead Channel Pacing Threshold Amplitude: 1 V
Lead Channel Pacing Threshold Amplitude: 1 V
Lead Channel Pacing Threshold Amplitude: 1.25 V
Lead Channel Pacing Threshold Pulse Width: 0.3 ms
Lead Channel Pacing Threshold Pulse Width: 0.3 ms
Lead Channel Pacing Threshold Pulse Width: 0.3 ms
Lead Channel Sensing Intrinsic Amplitude: 3.7 mV
Lead Channel Setting Pacing Amplitude: 2 V
Lead Channel Setting Pacing Amplitude: 2.25 V
Lead Channel Setting Pacing Amplitude: 2.5 V
Lead Channel Setting Pacing Pulse Width: 0.3 ms
MDC IDC LEAD IMPLANT DT: 20091016
MDC IDC LEAD LOCATION: 753859
MDC IDC MSMT LEADCHNL LV PACING THRESHOLD AMPLITUDE: 1.25 V
MDC IDC MSMT LEADCHNL LV PACING THRESHOLD PULSEWIDTH: 0.6 ms
MDC IDC MSMT LEADCHNL LV PACING THRESHOLD PULSEWIDTH: 0.6 ms
MDC IDC MSMT LEADCHNL RA PACING THRESHOLD AMPLITUDE: 0.5 V
MDC IDC MSMT LEADCHNL RA PACING THRESHOLD PULSEWIDTH: 0.3 ms
MDC IDC MSMT LEADCHNL RV SENSING INTR AMPL: 12 mV
MDC IDC PG IMPLANT DT: 20151111
MDC IDC SESS DTM: 20200108224608
MDC IDC SET LEADCHNL LV PACING PULSEWIDTH: 0.6 ms
MDC IDC SET LEADCHNL RV SENSING SENSITIVITY: 2 mV
Pulse Gen Model: 3222
Pulse Gen Serial Number: 7688750

## 2018-03-22 ENCOUNTER — Ambulatory Visit: Payer: BLUE CROSS/BLUE SHIELD | Admitting: Physician Assistant

## 2018-03-23 ENCOUNTER — Encounter: Payer: Self-pay | Admitting: Adult Health

## 2018-03-23 ENCOUNTER — Ambulatory Visit (INDEPENDENT_AMBULATORY_CARE_PROVIDER_SITE_OTHER): Payer: BLUE CROSS/BLUE SHIELD | Admitting: Adult Health

## 2018-03-23 VITALS — BP 122/72 | HR 85 | Temp 97.3°F | Ht 67.0 in | Wt 231.2 lb

## 2018-03-23 DIAGNOSIS — J029 Acute pharyngitis, unspecified: Secondary | ICD-10-CM | POA: Diagnosis not present

## 2018-03-23 MED ORDER — PREDNISONE 20 MG PO TABS: ORAL_TABLET | ORAL | 0 refills | Status: DC

## 2018-03-23 MED ORDER — AZITHROMYCIN 250 MG PO TABS: ORAL_TABLET | ORAL | 1 refills | Status: AC

## 2018-03-23 NOTE — Progress Notes (Signed)
Assessment and Plan:  [name] was seen today for acute visit.  Diagnoses and all orders for this visit:  Acute pharyngitis, unspecified etiology Pharyngitis: take medicationss as prescribed, increase fluids,  Salt water gargles, throat lozenges, voice rest.  If symptoms do not improve in 5-7 days or get worse contact the office or go to ER. -     azithromycin (ZITHROMAX) 250 MG tablet; Take 2 tablets (500 mg) on  Day 1,  followed by 1 tablet (250 mg) once daily on Days 2 through 5. -     predniSONE (DELTASONE) 20 MG tablet; 2 tablets daily for 3 days, 1 tablet daily for 4 days.  Further disposition pending results of labs. Discussed med's effects and SE's.   Over 15 minutes of exam, counseling, chart review, and critical decision making was performed.   Future Appointments  Date Time Provider Department Center  03/28/2018  9:00 AM [facility] DEVICE REMOTES [facility]  06/30/2018  9:00 AM Quentin Mulling, PA-C [facility] None  01/01/2019  3:00 PM Quentin Mulling, PA-C [facility] None    ------------------------------------------------------------------------------------------------------------------   HPI BP 122/72   Pulse 85   Temp (!) 97.3 F (36.3 C)   Ht 5\' 7"  (1.702 m)   Wt 231 lb 3.2 oz (104.9 kg)   SpO2 96%   BMI 36.21 kg/m   63 y.o.female with hx of T2DM, htn, cardiomyopathy, OSA on CPAP presents for evaluation of sore throat x 4 days, could barely swallow this AM, seems to be getting worse, has also with bilateral ear pressure/popping/clicking worse on right. No fever/chills. Denies cough. Endorses mild congestion, but improved from previous. MIld frontal headache. Hasn't tried anything other than hot tea/lemon.   Has been around a lot of people who have been strep +  She does have hx of GERD but recently well controlled without breakthrough sx off of medications  Past Medical History:  Diagnosis Date  . Automatic implantable cardiac defibrillator in  situ    [employer] 7122  . CHF (congestive heart failure) (HCC)   . Diabetes mellitus   . Hyperlipidemia   . Hypertension 06/25/2011   Renal dopplers - superior mesenteric artery >50% diameter reduction; R renal artery - normal patency; L proximal renal artery 1-59% reduction (lower end of scale); both kidneys normal in size/symmetry with normal cortex and medulla  . Leg pain 12/09/2010   doppler of R femoral artery - no evidence of dissection, AV fistula, pseudoaneurysm or other vascular abnormalities  . Migraines   . Obesity   . Sleep apnea    on CPAP - AHI during sleep study was 44  . Type II or unspecified type diabetes mellitus without mention of complication, not stated as uncontrolled   . Type II or unspecified type diabetes mellitus without mention of complication, not stated as uncontrolled   . Unspecified sleep apnea      Allergies  Allergen Reactions  . Flagyl [Metronidazole] Anaphylaxis  . Niacin And Related Other (See Comments) and Cough    flushing  . Penicillins Swelling    Has patient had a PCN reaction causing immediate rash, facial/tongue/throat swelling, SOB or lightheadedness with hypotension: No Has patient had a PCN reaction causing severe rash involving mucus membranes or skin necrosis: No Has patient had a PCN reaction that required hospitalization No Has patient had a PCN reaction occurring within the last 10 years: Yes 3-4 years ago If all of the above answers are "NO", then may proceed with Cephalosporin use.  Marland Kitchen  Yeast-Related Products Swelling and Rash    Current Outpatient Medications on File Prior to Visit  Medication Sig  . aspirin 81 MG tablet Take 162 mg by mouth at bedtime.   Marland Kitchen aspirin-acetaminophen-caffeine (EXCEDRIN MIGRAINE) 250-250-65 MG per tablet Take 2 tablets by mouth daily as needed for migraine.  . carvedilol (COREG) 25 MG tablet TAKE 1 TABLET BY MOUTH TWICE DAILY WITH A MEAL  . diazepam (VALIUM) 5 MG tablet TAKE 1 TABLET BY MOUTH AT  BEDTIME AS NEEDED FOR ANXIETY  . estradiol (ESTRACE) 1 MG tablet Take 1.5 tablets by mouth daily.  . fenofibrate micronized (LOFIBRA) 134 MG capsule TAKE 1 CAPSULE BY MOUTH EVERY DAY BEFORE BREAKFAST  . imipramine (TOFRANIL) 25 MG tablet TAKE 1 TO 2 TABLETS BY MOUTH EVERY DAY  . loratadine (CLARITIN) 10 MG tablet Take 10 mg by mouth daily.   Marland Kitchen losartan (COZAAR) 100 MG tablet TAKE 1/2 TABLET(50 MG) BY MOUTH DAILY  . metFORMIN (GLUMETZA) 500 MG (MOD) 24 hr tablet Take 1,000 mg by mouth 2 (two) times daily with a meal.   . NOVOLIN 70/30 RELION (70-30) 100 UNIT/ML injection Inject 45 Units into the skin 2 (two) times daily. Currently doing 35 units bid  . Potassium 99 MG TABS Take 1 tablet by mouth daily.  . rizatriptan (MAXALT) 10 MG tablet Take 1 tablet (10 mg total) by mouth as needed for migraine. May repeat in 2 hours if needed  . rosuvastatin (CRESTOR) 40 MG tablet TAKE 1 TABLET(40 MG) BY MOUTH DAILY  . spironolactone (ALDACTONE) 25 MG tablet TAKE 1 TABLET BY MOUTH TWICE DAILY  . traMADol (ULTRAM) 50 MG tablet Take 50 mg by mouth as directed.  . valACYclovir (VALTREX) 500 MG tablet TAKE 1 TABLET(500 MG) BY MOUTH DAILY  . verapamil (CALAN-SR) 240 MG CR tablet TAKE 1 TABLET(240mg ) BY MOUTH AT BEDTIME   No current facility-administered medications on file prior to visit.     ROS: all negative except above.   Physical Exam:  BP 122/72   Pulse 85   Temp (!) 97.3 F (36.3 C)   Ht 5\' 7"  (1.702 m)   Wt 231 lb 3.2 oz (104.9 kg)   SpO2 96%   BMI 36.21 kg/m   General Appearance: Well nourished, in no apparent distress. Eyes: PERRLA, EOMs, conjunctiva no swelling or erythema Sinuses: No Frontal/maxillary tenderness ENT/Mouth: Ext aud canals clear, TMs without erythema, bulging. No erythema, swelling, or exudate on post pharynx, pale, faint patchy texture.  Tonsils not swollen or erythematous. Hearing normal.  Neck: Supple Respiratory: Respiratory effort normal, BS equal bilaterally  without rales, rhonchi, wheezing or stridor.  Cardio: RRR with no MRGs. Brisk peripheral pulses without edema.  Abdomen: Soft, + BS.  Non tender Lymphatics: Non tender without lymphadenopathy.  Musculoskeletal: normal gait.  Skin: Warm, dry without rashes, lesions, ecchymosis.  Neuro: Cranial nerves intact. Normal muscle tone Psych: Awake and oriented X 3, normal affect, Insight and Judgment appropriate.     Dan Maker, NP 2:58 PM [facility]

## 2018-03-23 NOTE — Patient Instructions (Signed)

## 2018-03-28 ENCOUNTER — Ambulatory Visit (INDEPENDENT_AMBULATORY_CARE_PROVIDER_SITE_OTHER): Payer: BLUE CROSS/BLUE SHIELD

## 2018-03-28 DIAGNOSIS — I5022 Chronic systolic (congestive) heart failure: Secondary | ICD-10-CM

## 2018-03-28 DIAGNOSIS — I428 Other cardiomyopathies: Secondary | ICD-10-CM | POA: Diagnosis not present

## 2018-03-28 LAB — CUP PACEART REMOTE DEVICE CHECK
Battery Remaining Longevity: 102 mo
Battery Remaining Percentage: 95.5 %
Brady Statistic AP VS Percent: 1 %
Brady Statistic AS VS Percent: 1 %
Brady Statistic RA Percent Paced: 1.4 %
Implantable Lead Implant Date: 20091016
Implantable Lead Location: 753858
Implantable Lead Location: 753859
Implantable Lead Location: 753860
Implantable Lead Model: 4196
Implantable Lead Model: 7122
Lead Channel Impedance Value: 390 Ohm
Lead Channel Impedance Value: 850 Ohm
Lead Channel Pacing Threshold Amplitude: 1 V
Lead Channel Pacing Threshold Amplitude: 1.25 V
Lead Channel Pacing Threshold Pulse Width: 0.3 ms
Lead Channel Pacing Threshold Pulse Width: 0.3 ms
Lead Channel Pacing Threshold Pulse Width: 0.6 ms
Lead Channel Sensing Intrinsic Amplitude: 11.5 mV
Lead Channel Sensing Intrinsic Amplitude: 3.4 mV
Lead Channel Setting Pacing Amplitude: 2 V
Lead Channel Setting Pacing Amplitude: 2.5 V
Lead Channel Setting Pacing Pulse Width: 0.3 ms
Lead Channel Setting Sensing Sensitivity: 2 mV
MDC IDC LEAD IMPLANT DT: 20091016
MDC IDC LEAD IMPLANT DT: 20091016
MDC IDC MSMT BATTERY VOLTAGE: 2.98 V
MDC IDC MSMT LEADCHNL RA PACING THRESHOLD AMPLITUDE: 0.5 V
MDC IDC MSMT LEADCHNL RV IMPEDANCE VALUE: 510 Ohm
MDC IDC PG IMPLANT DT: 20151111
MDC IDC PG SERIAL: 7688750
MDC IDC SESS DTM: 20200121090015
MDC IDC SET LEADCHNL LV PACING AMPLITUDE: 2.25 V
MDC IDC SET LEADCHNL LV PACING PULSEWIDTH: 0.6 ms
MDC IDC STAT BRADY AP VP PERCENT: 1.7 %
MDC IDC STAT BRADY AS VP PERCENT: 98 %

## 2018-03-29 NOTE — Progress Notes (Signed)
Remote pacemaker transmission.   

## 2018-03-30 ENCOUNTER — Telehealth: Payer: Self-pay

## 2018-03-30 ENCOUNTER — Other Ambulatory Visit: Payer: Self-pay | Admitting: Adult Health

## 2018-03-30 MED ORDER — DOXYCYCLINE HYCLATE 100 MG PO CAPS: ORAL_CAPSULE | ORAL | 0 refills | Status: DC

## 2018-03-30 MED ORDER — PREDNISONE 20 MG PO TABS: ORAL_TABLET | ORAL | 0 refills | Status: DC

## 2018-03-30 NOTE — Telephone Encounter (Signed)
Patient complaining of sore throat, cough, congestion and sneezing. She has finished the zpak but still ongoing, not feeling any better. Requesting another antibiotic. Please advise

## 2018-03-31 ENCOUNTER — Encounter: Payer: Self-pay | Admitting: Cardiology

## 2018-03-31 NOTE — Telephone Encounter (Signed)
Patient notified

## 2018-03-31 NOTE — Telephone Encounter (Signed)
Results given.

## 2018-04-04 ENCOUNTER — Encounter: Payer: BLUE CROSS/BLUE SHIELD | Admitting: Internal Medicine

## 2018-04-27 ENCOUNTER — Other Ambulatory Visit: Payer: Self-pay | Admitting: Internal Medicine

## 2018-05-16 ENCOUNTER — Ambulatory Visit (INDEPENDENT_AMBULATORY_CARE_PROVIDER_SITE_OTHER): Payer: BLUE CROSS/BLUE SHIELD | Admitting: Physician Assistant

## 2018-05-16 ENCOUNTER — Encounter: Payer: Self-pay | Admitting: Physician Assistant

## 2018-05-16 VITALS — BP 120/70 | HR 74 | Temp 98.6°F | Ht 67.0 in | Wt 235.2 lb

## 2018-05-16 DIAGNOSIS — B999 Unspecified infectious disease: Secondary | ICD-10-CM | POA: Diagnosis not present

## 2018-05-16 DIAGNOSIS — J029 Acute pharyngitis, unspecified: Secondary | ICD-10-CM | POA: Diagnosis not present

## 2018-05-16 MED ORDER — AZITHROMYCIN 250 MG PO TABS: ORAL_TABLET | ORAL | 1 refills | Status: AC

## 2018-05-16 NOTE — Patient Instructions (Signed)
-Make sure you are drinking plenty of fluids to stay hydrated.  -while drinking fluids pinch and hold nose close and swallow, to help open eustachian tubes and drain fluid behind ear drums. -you can do salt water gargles. You can also do 1 TSP liquid Maalox and 1 TSP liquid benadryl- mix/ gargle/ spit  If you are not feeling better in 10-14 days, then please call the office.  Pharyngitis Pharyngitis is redness, pain, and swelling (inflammation) of your pharynx.  CAUSES  Pharyngitis is usually caused by infection. Most of the time, these infections are from viruses (viral) and are part of a cold. However, sometimes pharyngitis is caused by bacteria (bacterial). Pharyngitis can also be caused by allergies. Viral pharyngitis may be spread from person to person by coughing, sneezing, and personal items or utensils (cups, forks, spoons, toothbrushes). Bacterial pharyngitis may be spread from person to person by more intimate contact, such as kissing.  SIGNS AND SYMPTOMS  Symptoms of pharyngitis include:   Sore throat.   Tiredness (fatigue).   Low-grade fever.   Headache.  Joint pain and muscle aches.  Skin rashes.  Swollen lymph nodes.  Plaque-like film on throat or tonsils (often seen with bacterial pharyngitis). DIAGNOSIS  Your health care provider will ask you questions about your illness and your symptoms. Your medical history, along with a physical exam, is often all that is needed to diagnose pharyngitis. Sometimes, a rapid strep test is done. Other lab tests may also be done, depending on the suspected cause.  TREATMENT  Viral pharyngitis will usually get better in 3-4 days without the use of medicine. Bacterial pharyngitis is treated with medicines that kill germs (antibiotics).  HOME CARE INSTRUCTIONS   Drink enough water and fluids to keep your urine clear or pale yellow.   Only take over-the-counter or prescription medicines as directed by your health care  provider:   If you are prescribed antibiotics, make sure you finish them even if you start to feel better.   Do not take aspirin.   Get lots of rest.   Gargle with 8 oz of salt water ( tsp of salt per 1 qt of water) as often as every 1-2 hours to soothe your throat.   Throat lozenges (if you are not at risk for choking) or sprays may be used to soothe your throat. SEEK MEDICAL CARE IF:   You have large, tender lumps in your neck.  You have a rash.  You cough up green, yellow-brown, or bloody spit. SEEK IMMEDIATE MEDICAL CARE IF:   Your neck becomes stiff.  You drool or are unable to swallow liquids.  You vomit or are unable to keep medicines or liquids down.  You have severe pain that does not go away with the use of recommended medicines.  You have trouble breathing (not caused by a stuffy nose). MAKE SURE YOU:   Understand these instructions.  Will watch your condition.  Will get help right away if you are not doing well or get worse. Document Released: 02/22/2005 Document Revised: 12/13/2012 Document Reviewed: 10/30/2012 [facility] Patient Information 2015 [place], [place]. This information is not intended to replace advice given to you by your health care provider. Make sure you discuss any questions you have with your health care provider.  Coronavirus or Covid 19  . You will use the same precautions as you would with the flu virus.  . While we do not have a vaccine against Covid 19 and will not for several years, WE  do have a vaccine against the flu. Please get this if you have not yet.  Marland Kitchen Please wash your hands frequently. Reyes Ivan your hands before you eat or touch your face.  . Avoid touching your face, eyes, nose, mouth as much as possible.  . Routinely clean frequently touched items such as doorknobs, keyboards, and phones.  . Avoid crowds of people.   To get more information from reputable sources:  You can call this hotline set up by [facility] 1 877  40COVID [phone])  You can visit these websites: CDC.gov WHO.int  If you feel that you have come in contact with someone that may be infected with coronavirus.  Please stay at home.  Call our office.  Call the local health department.  [facility] Department [phone])  After hours nurse triage line [phone])

## 2018-05-16 NOTE — Progress Notes (Signed)
Subjective:    Patient ID: [name], female    DOB: [birth year], 64 y.o.   MRN: [id number]  HPI 63 y.o. obese WF with history of CHF, HTN, aortic atherosclerosis, OSA, DM2 presents with sore throat.  She woke up up at 3AM with sore throat, sneezing, ear pressure.  No fever, chills, cough, SOB.  No sick contacts. No recent travel.   Blood pressure 120/70, pulse 74, temperature 98.6 F (37 C), height 5\' 7"  (1.702 m), weight 235 lb 3.2 oz (106.7 kg), SpO2 95 %.  Medications Current Outpatient Medications on File Prior to Visit  Medication Sig  . aspirin 81 MG tablet Take 162 mg by mouth at bedtime.   Marland Kitchen aspirin-acetaminophen-caffeine (EXCEDRIN MIGRAINE) 250-250-65 MG per tablet Take 2 tablets by mouth daily as needed for migraine.  . carvedilol (COREG) 25 MG tablet TAKE 1 TABLET BY MOUTH TWICE DAILY WITH A MEAL  . diazepam (VALIUM) 5 MG tablet TAKE 1 TABLET BY MOUTH AT BEDTIME AS NEEDED FOR ANXIETY  . estradiol (ESTRACE) 1 MG tablet Take 1.5 tablets by mouth daily.  . fenofibrate micronized (LOFIBRA) 134 MG capsule TAKE 1 CAPSULE BY MOUTH EVERY DAY BEFORE BREAKFAST  . imipramine (TOFRANIL) 25 MG tablet Take 1 to 2 tablets daily as directed  . loratadine (CLARITIN) 10 MG tablet Take 10 mg by mouth daily.   Marland Kitchen losartan (COZAAR) 100 MG tablet TAKE 1/2 TABLET(50 MG) BY MOUTH DAILY  . metFORMIN (GLUMETZA) 500 MG (MOD) 24 hr tablet Take 1,000 mg by mouth 2 (two) times daily with a meal.   . NOVOLIN 70/30 RELION (70-30) 100 UNIT/ML injection Inject 45 Units into the skin 2 (two) times daily. Currently doing 35 units bid  . Potassium 99 MG TABS Take 1 tablet by mouth daily.  . rizatriptan (MAXALT) 10 MG tablet Take 1 tablet (10 mg total) by mouth as needed for migraine. May repeat in 2 hours if needed  . rosuvastatin (CRESTOR) 40 MG tablet TAKE 1 TABLET(40 MG) BY MOUTH DAILY  . spironolactone (ALDACTONE) 25 MG tablet TAKE 1 TABLET BY MOUTH TWICE DAILY  . traMADol (ULTRAM) 50 MG tablet Take  50 mg by mouth as directed.  . valACYclovir (VALTREX) 500 MG tablet TAKE 1 TABLET(500 MG) BY MOUTH DAILY  . verapamil (CALAN-SR) 240 MG CR tablet TAKE 1 TABLET(240mg ) BY MOUTH AT BEDTIME  . doxycycline (VIBRAMYCIN) 100 MG capsule Take 1 capsule twice daily with food (Patient not taking: Reported on 05/16/2018)  . predniSONE (DELTASONE) 20 MG tablet 2 tablets daily for 3 days, 1 tablet daily for 4 days. (Patient not taking: Reported on 05/16/2018)   No current facility-administered medications on file prior to visit.     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   Review of Systems  Constitutional: Positive for chills. Negative for diaphoresis.  HENT: Positive for congestion, ear pain, sinus pressure and sore throat. Negative for sneezing.   Respiratory: Negative.  Negative for cough and shortness of breath.   Cardiovascular: Negative.  Musculoskeletal: Positive for neck pain.  Neurological: Negative.  Negative for headaches.       Objective:   Physical Exam Constitutional:      Appearance: She is well-developed.  HENT:     Head: Normocephalic and atraumatic.     Right Ear: External ear normal.     Left Ear: External ear normal.     Mouth/Throat:     Pharynx: Uvula midline. Posterior oropharyngeal erythema present.  Eyes:      Conjunctiva/sclera: Conjunctivae normal.     Pupils: Pupils are equal, round, and reactive to light.  Neck:     Musculoskeletal: Normal range of motion and neck supple.  Cardiovascular:     Rate and Rhythm: Normal rate and regular rhythm.     Heart sounds: Normal heart sounds.  Pulmonary:     Effort: Pulmonary effort is normal.     Breath sounds: Normal breath sounds.  Abdominal:     General: Bowel sounds are normal.     Palpations: Abdomen is soft.  Lymphadenopathy:     Cervical: Cervical adenopathy present.  Skin:    General: Skin is warm and dry.        Assessment & Plan:    Acute pharyngitis, unspecified etiology Will hold the zpak and take if she is not getting better, increase fluids, rest, cont allergy pill -     azithromycin (ZITHROMAX) 250 MG tablet; Take 2 tablets (500 mg) on  Day 1,  followed by 1 tablet (250 mg) once daily on Days 2 through 5.

## 2018-05-17 LAB — IGG, IGA, IGM
IGM, SERUM: 70 mg/dL (ref 50–300)
IgG (Immunoglobin G), Serum: 1154 mg/dL (ref 600–1540)
Immunoglobulin A: 301 mg/dL (ref 70–320)

## 2018-05-22 ENCOUNTER — Other Ambulatory Visit: Payer: Self-pay | Admitting: Internal Medicine

## 2018-06-19 ENCOUNTER — Telehealth: Payer: BLUE CROSS/BLUE SHIELD | Admitting: Physician Assistant

## 2018-06-19 MED ORDER — PHENTERMINE HCL 37.5 MG PO TABS: 37.5000 mg | ORAL_TABLET | Freq: Every day | ORAL | 2 refills | Status: DC

## 2018-06-19 NOTE — Telephone Encounter (Signed)
THIS ENCOUNTER IS A VIRTUAL VISIT DUE TO COVID-19 - PATIENT WAS NOT SEEN IN THE OFFICE.  PATIENT HAS CONSENTED TO VIRTUAL VISIT / TELEMEDICINE VISIT   Virtual Visit via telephone Note  I connected with [name] on 06/19/2018  by telephone.  I verified that I am speaking with the correct person using two identifiers.    I discussed the limitations of evaluation and management by telemedicine and the availability of in person appointments. The patient expressed understanding and agreed to proceed.  History of Present Illness: 63 y.o. obese white female with history of DM calls to complain of increased appetite and decreased motivation at this time.  She is requesting something to help with her appetite.  She states she is not fluid over loaded, she deenies dyspnea on exertion, orthopnea, paroxysmal nocturnal dyspnea and edema.    Medications  Current Outpatient Medications (Endocrine & Metabolic):  .  estradiol (ESTRACE) 1 MG tablet, Take 1.5 tablets by mouth daily. .  metFORMIN (GLUMETZA) 500 MG (MOD) 24 hr tablet, Take 1,000 mg by mouth 2 (two) times daily with a meal.  .  NOVOLIN 70/30 RELION (70-30) 100 UNIT/ML injection, Inject 45 Units into the skin 2 (two) times daily. Currently doing 35 units bid .  predniSONE (DELTASONE) 20 MG tablet, 2 tablets daily for 3 days, 1 tablet daily for 4 days. (Patient not taking: Reported on 05/16/2018)  Current Outpatient Medications (Cardiovascular):  .  carvedilol (COREG) 25 MG tablet, TAKE 1 TABLET BY MOUTH TWICE DAILY WITH A MEAL .  fenofibrate micronized (LOFIBRA) 134 MG capsule, TAKE 1 CAPSULE BY MOUTH EVERY DAY BEFORE BREAKFAST .  losartan (COZAAR) 100 MG tablet, TAKE 1/2 TABLET(50 MG) BY MOUTH DAILY .  rosuvastatin (CRESTOR) 40 MG tablet, TAKE 1 TABLET(40 MG) BY MOUTH DAILY .  spironolactone (ALDACTONE) 25 MG tablet, TAKE 1 TABLET BY MOUTH TWICE DAILY .  verapamil (CALAN-SR) 240 MG CR tablet, TAKE 1 TABLET(240mg ) BY MOUTH AT  BEDTIME  Current Outpatient Medications (Respiratory):  .  loratadine (CLARITIN) 10 MG tablet, Take 10 mg by mouth daily.   Current Outpatient Medications (Analgesics):  .  aspirin 81 MG tablet, Take 162 mg by mouth at bedtime.  Marland Kitchen  aspirin-acetaminophen-caffeine (EXCEDRIN MIGRAINE) 250-250-65 MG per tablet, Take 2 tablets by mouth daily as needed for migraine. .  rizatriptan (MAXALT) 10 MG tablet, Take 1 tablet (10 mg total) by mouth as needed for migraine. May repeat in 2 hours if needed .  traMADol (ULTRAM) 50 MG tablet, Take 50 mg by mouth as directed.   Current Outpatient Medications (Other):  .  diazepam (VALIUM) 5 MG tablet, Take 1/2 to 1 tablet at Bedtime ONLY if needed .  doxycycline (VIBRAMYCIN) 100 MG capsule, Take 1 capsule twice daily with food (Patient not taking: Reported on 05/16/2018) .  imipramine (TOFRANIL) 25 MG tablet, Take 1 to 2 tablets daily as directed .  Potassium 99 MG TABS, Take 1 tablet by mouth daily. .  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   Observations/Objective: General Appearance:Well sounding, in no apparent distress.  ENT/Mouth: No hoarseness, she did have a mild cough during the phone interview.  Respiratory: completing  full sentences without distress, without audible wheeze Neuro: Awake and oriented X 3,  Psych:  Insight and Judgment appropriate.     Assessment and Plan:  Morbid obesity (HCC) -     phentermine (ADIPEX-P) 37.5 MG tablet; Take 1 tablet (37.5 mg total) by mouth daily before breakfast. - follow up 1 month for progress monitoring iva phone visit - increase veggies, decrease carbs - long discussion about weight loss, diet, and exercise  Go to the ER if any chest pain, shortness of breath, nausea, dizziness, severe HA, changes vision/speech   Follow Up Instructions:    I discussed the assessment and treatment plan with the patient. The patient was provided an opportunity to ask questions and all were answered. The patient agreed with the plan and demonstrated an understanding of the instructions.   The patient was advised to call back or seek an in-person evaluation if the symptoms worsen or if the condition fails to improve as anticipated.  I provided 15 minutes of non-face-to-face time during this encounter.   Quentin Mulling, PA-C

## 2018-06-27 ENCOUNTER — Ambulatory Visit (INDEPENDENT_AMBULATORY_CARE_PROVIDER_SITE_OTHER): Payer: BLUE CROSS/BLUE SHIELD | Admitting: *Deleted

## 2018-06-27 ENCOUNTER — Other Ambulatory Visit: Payer: Self-pay

## 2018-06-27 DIAGNOSIS — I5022 Chronic systolic (congestive) heart failure: Secondary | ICD-10-CM

## 2018-06-27 DIAGNOSIS — I428 Other cardiomyopathies: Secondary | ICD-10-CM | POA: Diagnosis not present

## 2018-06-27 LAB — CUP PACEART REMOTE DEVICE CHECK
Battery Remaining Longevity: 95 mo
Battery Remaining Percentage: 95.5 %
Battery Voltage: 2.96 V
Brady Statistic AP VP Percent: 3.3 %
Brady Statistic AP VS Percent: 1 %
Brady Statistic AS VP Percent: 96 %
Brady Statistic AS VS Percent: 1 %
Brady Statistic RA Percent Paced: 3.1 %
Date Time Interrogation Session: 20200421080029
Implantable Lead Implant Date: 20091016
Implantable Lead Implant Date: 20091016
Implantable Lead Implant Date: 20091016
Implantable Lead Location: 753858
Implantable Lead Location: 753859
Implantable Lead Location: 753860
Implantable Lead Model: 4196
Implantable Lead Model: 7122
Implantable Pulse Generator Implant Date: 20151111
Lead Channel Impedance Value: 400 Ohm
Lead Channel Impedance Value: 450 Ohm
Lead Channel Impedance Value: 800 Ohm
Lead Channel Pacing Threshold Amplitude: 0.5 V
Lead Channel Pacing Threshold Amplitude: 1 V
Lead Channel Pacing Threshold Amplitude: 1.25 V
Lead Channel Pacing Threshold Pulse Width: 0.3 ms
Lead Channel Pacing Threshold Pulse Width: 0.3 ms
Lead Channel Pacing Threshold Pulse Width: 0.6 ms
Lead Channel Sensing Intrinsic Amplitude: 12 mV
Lead Channel Sensing Intrinsic Amplitude: 3.4 mV
Lead Channel Setting Pacing Amplitude: 2 V
Lead Channel Setting Pacing Amplitude: 2.25 V
Lead Channel Setting Pacing Amplitude: 2.5 V
Lead Channel Setting Pacing Pulse Width: 0.3 ms
Lead Channel Setting Pacing Pulse Width: 0.6 ms
Lead Channel Setting Sensing Sensitivity: 2 mV
Pulse Gen Model: 3222
Pulse Gen Serial Number: 7688750

## 2018-06-30 ENCOUNTER — Ambulatory Visit: Payer: Self-pay | Admitting: Physician Assistant

## 2018-07-04 NOTE — Progress Notes (Signed)
Remote pacemaker transmission.   

## 2018-08-17 ENCOUNTER — Other Ambulatory Visit: Payer: Self-pay | Admitting: Physician Assistant

## 2018-08-17 ENCOUNTER — Other Ambulatory Visit: Payer: Self-pay | Admitting: Cardiovascular Disease

## 2018-08-17 DIAGNOSIS — I10 Essential (primary) hypertension: Secondary | ICD-10-CM

## 2018-08-23 ENCOUNTER — Other Ambulatory Visit: Payer: Self-pay | Admitting: Cardiovascular Disease

## 2018-08-23 DIAGNOSIS — I5022 Chronic systolic (congestive) heart failure: Secondary | ICD-10-CM

## 2018-09-20 ENCOUNTER — Other Ambulatory Visit: Payer: Self-pay | Admitting: Cardiovascular Disease

## 2018-09-26 ENCOUNTER — Encounter: Payer: BLUE CROSS/BLUE SHIELD | Admitting: *Deleted

## 2018-09-27 ENCOUNTER — Telehealth: Payer: Self-pay

## 2018-09-27 NOTE — Telephone Encounter (Signed)
Left message for patient to remind of missed remote transmission.  

## 2018-10-01 LAB — CUP PACEART REMOTE DEVICE CHECK
Battery Remaining Longevity: 94 mo
Battery Remaining Percentage: 95.5 %
Battery Voltage: 2.96 V
Brady Statistic AP VP Percent: 4.2 %
Brady Statistic AP VS Percent: 1 %
Brady Statistic AS VP Percent: 96 %
Brady Statistic AS VS Percent: 1 %
Brady Statistic RA Percent Paced: 4 %
Date Time Interrogation Session: 20200725192907
Implantable Lead Implant Date: 20091016
Implantable Lead Implant Date: 20091016
Implantable Lead Implant Date: 20091016
Implantable Lead Location: 753858
Implantable Lead Location: 753859
Implantable Lead Location: 753860
Implantable Lead Model: 4196
Implantable Lead Model: 7122
Implantable Pulse Generator Implant Date: 20151111
Lead Channel Impedance Value: 390 Ohm
Lead Channel Impedance Value: 460 Ohm
Lead Channel Impedance Value: 780 Ohm
Lead Channel Pacing Threshold Amplitude: 0.5 V
Lead Channel Pacing Threshold Amplitude: 1 V
Lead Channel Pacing Threshold Amplitude: 1.25 V
Lead Channel Pacing Threshold Pulse Width: 0.3 ms
Lead Channel Pacing Threshold Pulse Width: 0.3 ms
Lead Channel Pacing Threshold Pulse Width: 0.6 ms
Lead Channel Sensing Intrinsic Amplitude: 12 mV
Lead Channel Sensing Intrinsic Amplitude: 3.4 mV
Lead Channel Setting Pacing Amplitude: 2 V
Lead Channel Setting Pacing Amplitude: 2.25 V
Lead Channel Setting Pacing Amplitude: 2.5 V
Lead Channel Setting Pacing Pulse Width: 0.3 ms
Lead Channel Setting Pacing Pulse Width: 0.6 ms
Lead Channel Setting Sensing Sensitivity: 2 mV
Pulse Gen Model: 3222
Pulse Gen Serial Number: 7688750

## 2018-10-02 ENCOUNTER — Ambulatory Visit (INDEPENDENT_AMBULATORY_CARE_PROVIDER_SITE_OTHER): Payer: BC Managed Care – PPO | Admitting: *Deleted

## 2018-10-02 DIAGNOSIS — I5022 Chronic systolic (congestive) heart failure: Secondary | ICD-10-CM

## 2018-10-02 DIAGNOSIS — I428 Other cardiomyopathies: Secondary | ICD-10-CM

## 2018-10-19 NOTE — Progress Notes (Signed)
Remote pacemaker transmission.   

## 2018-10-30 ENCOUNTER — Other Ambulatory Visit: Payer: Self-pay | Admitting: Cardiovascular Disease

## 2018-10-30 DIAGNOSIS — I5022 Chronic systolic (congestive) heart failure: Secondary | ICD-10-CM

## 2018-11-06 NOTE — Progress Notes (Signed)
Cardiology Office Note:    Date:  11/08/2018   ID:  Kara AlpersRhonda S Bille, DOB 06-04-1955, MRN [id number]  PCP:  Lucky CowboyMcKeown, William, MD  Cardiologist:  Nicki Guadalajarahomas Kelly, MD   Referring MD: Lucky CowboyMcKeown, William, MD   Chief Complaint  Patient presents with  . Follow-up    hypertension    History of Present Illness:    [name] is a 63 y.o. female with a hx of NICM s/p BiV ICD with downgrade to BiV PPM in 2015 with recovery of EF, OSA on CPAP, HTN, HLD, and DM2. EF normalized in 2013. She was last seen in clinic 02/2018 by [facility] for [facility]. Echo obtained at that time with low-normal EF of 50-55% and grade 1 DD, no valvular disease, and small to moderate pericardial effusion unchanged from prior US in 2015.  During her clinic visit, she was found to have symptomatic PVCs and her verapamil was increased to 240 mg daily. She was seen in EP clinic 03/15/18 and her palpitations were improved on the medication changes. PPM working normally.   She presents today for follow up. Overall, she is doing very well. She has had an intentionally weight loss of 30 lbs over the last 10 weeks through diet and exercise. Since the weight loss, she has decreased her insulin and questions decreasing her hypertension medication regimen. She denies anginal symptoms, breathing problems, swelling, palpitations, and syncope. 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 She denies anginal symptoms, breathing problems, swelling, palpitations, and syncope.     Past Medical History:  Diagnosis Date  . Automatic implantable cardiac defibrillator in situ    [employer] JUDE MODEL 7122  . CHF (congestive heart failure) (HCC)   . Diabetes mellitus   . Hyperlipidemia   . Hypertension 06/25/2011   Renal dopplers - superior mesenteric artery >50% diameter reduction; R renal artery - normal patency; L proximal renal  artery 1-59% reduction (lower end of scale); both kidneys normal in size/symmetry with normal cortex and medulla  . Leg pain 12/09/2010   doppler of R femoral artery - no evidence of dissection, AV fistula, pseudoaneurysm or other vascular abnormalities  . Migraines   . Obesity   . Sleep apnea    on CPAP - AHI during sleep study was 44  . Type II or unspecified type diabetes mellitus without mention of complication, not stated as uncontrolled   . Type II or unspecified type diabetes mellitus without mention of complication, not stated as uncontrolled   . Unspecified sleep apnea     Past Surgical History:  Procedure Laterality Date  . ABDOMINAL HYSTERECTOMY    . BIV PACEMAKER GENERATOR CHANGE OUT N/A 01/16/2014   Procedure: BIV PACEMAKER GENERATOR CHANGE OUT;  Surgeon: Marinus MawGregg W Taylor, MD;  [facility];  Service: Cardiovascular;  Laterality: N/A;  . CARDIAC CATHETERIZATION  07/15/2006   no significant CAD by cardiac cath, confirmed by intravascular US of LAD; non-ischemic cardiomyopathy prob related to uncontrolled hypertension, DM and morbid obesity; moderate pulmonary hypertension; preserved cardiac output and cardiac index  . CARDIAC DEFIBRILLATOR PLACEMENT     ICD by Dr. Ladona Ridgelaylor  . CARDIOVASCULAR STRESS TEST  08/28/2010   R/P MV - pattern of normal perfusion in all regions, no scintigraphic evidence of inducible myocardial ischemia; no EKG changes; normal perfusion study; pt did experience chesst pain during strudy, resolved spontaneously  .  DOPPLER ECHOCARDIOGRAPHY  06/25/2011   EF >55%; moderate concentric LV hypertrophy; LA mildly dilated, mild mitral annular calcification;   . KNEE SURGERY    . NECK SURGERY    . PACEMAKER INSERTION      Current Medications: Current Meds  Medication Sig  . aspirin 81 MG tablet Take 162 mg by mouth at bedtime.   Marland Kitchen. aspirin-acetaminophen-caffeine (EXCEDRIN MIGRAINE) 250-250-65 MG per tablet Take 2 tablets by mouth daily as needed for migraine.   . carvedilol (COREG) 25 MG tablet Take 1 tablet (25 mg total) by mouth 2 (two) times daily with a meal.  . diazepam (VALIUM) 5 MG tablet Take 1/2 to 1 tablet at Bedtime ONLY if needed  . doxycycline (VIBRAMYCIN) 100 MG capsule Take 1 capsule twice daily with food  . estradiol (ESTRACE) 1 MG tablet Take 1.5 tablets by mouth daily.  . fenofibrate micronized (LOFIBRA) 134 MG capsule Take 1 capsule (134 mg total) by mouth daily before breakfast.  . imipramine (TOFRANIL) 25 MG tablet Take 1 to 2 tablets daily as directed  . loratadine (CLARITIN) 10 MG tablet Take 10 mg by mouth daily.   . metFORMIN (GLUMETZA) 500 MG (MOD) 24 hr tablet Take 1,000 mg by mouth 2 (two) times daily with a meal.   . NOVOLIN 70/30 RELION (70-30) 100 UNIT/ML injection Inject 30 Units into the skin 2 (two) times daily. Currently doing 20 units bid  . phentermine (ADIPEX-P) 37.5 MG tablet Take 1 tablet (37.5 mg total) by mouth daily before breakfast. (Patient taking differently: Take 18.75 mg by mouth daily before breakfast. )  . Potassium 99 MG TABS Take 0.5 tablets (49.5 mg total) by mouth daily.  . predniSONE (DELTASONE) 20 MG tablet 2 tablets daily for 3 days, 1 tablet daily for 4 days.  . rizatriptan (MAXALT) 10 MG tablet Take 1 tablet (10 mg total) by mouth as needed for migraine. May repeat in 2 hours if needed  . rosuvastatin (CRESTOR) 40 MG tablet TAKE 1 TABLET(40 MG) BY MOUTH DAILY.  Marland Kitchen. spironolactone (ALDACTONE) 25 MG tablet Take 1 tablet (25 mg total) by mouth 2 (two) times daily.  . traMADol (ULTRAM) 50 MG tablet Take 50 mg by mouth as directed.  . valACYclovir (VALTREX) 500 MG tablet TAKE 1 TABLET(500 MG) BY MOUTH DAILY  . verapamil (CALAN-SR) 240 MG CR tablet TAKE 1 TABLET(240 MG) BY MOUTH AT BEDTIME  . [DISCONTINUED] carvedilol (COREG) 25 MG tablet TAKE 1 TABLET BY MOUTH TWICE DAILY WITH A MEAL  . [DISCONTINUED] fenofibrate micronized (LOFIBRA) 134 MG capsule TAKE 1 CAPSULE BY MOUTH EVERY DAY BEFORE BREAKFAST   . [DISCONTINUED] losartan (COZAAR) 100 MG tablet Take 0.5 tablets (50 mg total) by mouth daily. NEEDS APPOINTMENT.  . [DISCONTINUED] Potassium 99 MG TABS Take 1 tablet by mouth daily.  . [DISCONTINUED] rosuvastatin (CRESTOR) 40 MG tablet TAKE 1 TABLET(40 MG) BY MOUTH DAILY. NEEDS APPOINTMENT.  . [DISCONTINUED] spironolactone (ALDACTONE) 25 MG tablet TAKE 1 TABLET BY MOUTH TWICE DAILY  . [DISCONTINUED] verapamil (CALAN-SR) 240 MG CR tablet TAKE 1 TABLET(240 MG) BY MOUTH AT BEDTIME     Allergies:   Flagyl [metronidazole], Niacin and related, Penicillins, and Yeast-related products   Social History   Socioeconomic History  . Marital status: Married    Spouse name: Not on file  . Number of children: Not on file  . Years of education: Not on file  . Highest education level: Not on file  Occupational History  . Not on file  Social Needs  .  Financial resource strain: Not on file  . Food insecurity    Worry: Not on file    Inability: Not on file  . Transportation needs    Medical: Not on file    Non-medical: Not on file  Tobacco Use  . Smoking status: Former Smoker    Types: Cigarettes    Quit date: 03/09/1975    Years since quitting: 43.6  . Smokeless tobacco: Never Used  Substance and Sexual Activity  . Alcohol use: Yes    Comment: Once a month.   . Drug use: No  . Sexual activity: Not on file  Lifestyle  . Physical activity    Days per week: Not on file    Minutes per session: Not on file  . Stress: Not on file  Relationships  . Social Musician on phone: Not on file    Gets together: Not on file    Attends religious service: Not on file    Active member of club or organization: Not on file    Attends meetings of clubs or organizations: Not on file    Relationship status: Not on file  Other Topics Concern  . 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  ROS:   Please see the history of present illness.     All other systems reviewed and are negative.  EKGs/Labs/Other Studies Reviewed:    The following studies were reviewed today:  Echo 03/07/18: Study Conclusions  - Left ventricle: The cavity size was normal. Systolic function was   normal. The estimated ejection fraction was in the range of 50%   to 55%. Wall motion was normal; there were no regional wall   motion abnormalities. Doppler parameters are consistent with   abnormal left ventricular relaxation (grade 1 diastolic   dysfunction). - Left atrium: The atrium was moderately dilated. - Right ventricle: The cavity size was mildly dilated. Wall   thickness was normal. - Pericardium, extracardiac: A small to moderate, free-flowing   pericardial effusion was identified circumferential to the heart.  Impressions: - A small pericardial effusion has been present at least since   2015.  EKG:  EKG is ordered today.  The ekg ordered today demonstrates sinus rhythm HR 69, first degree heart block PR 224 ms, iLBBB QRS 102 ms, appears to be V paced - similar to prior tracings  Recent Labs: 12/22/2017: ALT 26; BUN 17; Creat 1.08; Hemoglobin 13.9; Magnesium 1.4; Platelets 299; Potassium 4.9; Sodium 138; TSH 1.99  Recent Lipid Panel    Component Value Date/Time   CHOL 97 12/22/2017 1635   TRIG 170 (H) 12/22/2017 1635   HDL 28 (L) 12/22/2017 1635   CHOLHDL 3.5 12/22/2017 1635   VLDL 27 11/19/2015 0954   LDLCALC 44 12/22/2017 1635    Physical Exam:    VS:  BP 102/60   Ht 5\' 7"  (1.702 m)   Wt 211 lb (95.7 kg)   BMI 33.05 kg/m     Wt Readings from Last 3 Encounters:  11/08/18 211 lb (95.7 kg)  05/16/18 235 lb 3.2 oz (106.7 kg)  03/23/18 231 lb 3.2 oz (104.9 kg)     GEN:  Well nourished, well developed in no acute  distress HEENT: Normal NECK:  No JVD; No carotid bruits CARDIAC: RRR, no murmurs, rubs, gallops RESPIRATORY:  Clear to auscultation without rales, wheezing or rhonchi  ABDOMEN: Soft, non-tender, non-distended MUSCULOSKELETAL:  No edema; No deformity  SKIN: Warm and dry NEUROLOGIC:  Alert and oriented x 3 PSYCHIATRIC:  Normal affect   ASSESSMENT:    1. Nonischemic cardiomyopathy ([place])   2. Essential hypertension   3. Biventricular cardiac pacemaker in situ   4. OSA on CPAP   5. Hyperlipidemia, unspecified hyperlipidemia type    PLAN:    In order of problems listed above:  Hx of NICM EF normalized to 50-55% BiV PPM in place (2015) - ASA, coreg 25 mg BID, spirolactone 25 mg BID, and verapamil 240 mg daily - pressures marginal today - we will decrease losartan to 25 mg daily and decreases potassium supplement to 1/2 tablet (takes for leg cramps) - last Mg was 1.4 - advised to start 400 mg Magnesium OTC daily - she would prefer to have labs drawn by PCP in October - they will fax our office - last PPM transmission with normal function   OSA on CPAP - compliant   HTN - medications as above - if further reduction needed, since she intends to lose another 15 lbs, consider decreasing spironolactone   HLD 12/22/2017: Cholesterol 97; HDL 28; LDL Cholesterol (Calc) 44; Triglycerides 170 - fenofibrate, crestor   Follow up with Dr. Claiborne Billings in 1 year.   Medication Adjustments/Labs and Tests Ordered: Current medicines are reviewed at length with the patient today.  Concerns regarding medicines are outlined above.  Orders Placed This Encounter  Procedures  . EKG 12-Lead   Meds ordered this encounter  Medications  . carvedilol (COREG) 25 MG tablet    Sig: Take 1 tablet (25 mg total) by mouth 2 (two) times daily with a meal.    Dispense:  180 tablet    Refill:  3  . fenofibrate micronized (LOFIBRA) 134 MG capsule    Sig: Take 1 capsule (134 mg total) by mouth daily before  breakfast.    Dispense:  90 capsule    Refill:  3    PLEASE SCHEDULE AN APPT FOR FUTURE REFILLS  . Potassium 99 MG TABS    Sig: Take 0.5 tablets (49.5 mg total) by mouth daily.    Dispense:  330 tablet  . rosuvastatin (CRESTOR) 40 MG tablet    Sig: TAKE 1 TABLET(40 MG) BY MOUTH DAILY.    Dispense:  90 tablet    Refill:  3  . spironolactone (ALDACTONE) 25 MG tablet    Sig: Take 1 tablet (25 mg total) by mouth 2 (two) times daily.    Dispense:  180 tablet    Refill:  3  . verapamil (CALAN-SR) 240 MG CR tablet    Sig: TAKE 1 TABLET(240 MG) BY MOUTH AT BEDTIME    Dispense:  90 tablet    Refill:  3  . losartan (COZAAR) 25 MG tablet    Sig: Take 1 tablet (25 mg total) by mouth daily.    Dispense:  90 tablet    Refill:  3    Signed, Ledora Bottcher, [place]  11/08/2018 9:32 AM    [facility]

## 2018-11-08 ENCOUNTER — Ambulatory Visit (INDEPENDENT_AMBULATORY_CARE_PROVIDER_SITE_OTHER): Payer: BC Managed Care – PPO | Admitting: Physician Assistant

## 2018-11-08 ENCOUNTER — Other Ambulatory Visit: Payer: Self-pay

## 2018-11-08 ENCOUNTER — Encounter: Payer: Self-pay | Admitting: Physician Assistant

## 2018-11-08 VITALS — BP 102/60 | Ht 67.0 in | Wt 211.0 lb

## 2018-11-08 DIAGNOSIS — I428 Other cardiomyopathies: Secondary | ICD-10-CM | POA: Diagnosis not present

## 2018-11-08 DIAGNOSIS — G4733 Obstructive sleep apnea (adult) (pediatric): Secondary | ICD-10-CM | POA: Diagnosis not present

## 2018-11-08 DIAGNOSIS — I10 Essential (primary) hypertension: Secondary | ICD-10-CM

## 2018-11-08 DIAGNOSIS — Z950 Presence of cardiac pacemaker: Secondary | ICD-10-CM | POA: Diagnosis not present

## 2018-11-08 DIAGNOSIS — E785 Hyperlipidemia, unspecified: Secondary | ICD-10-CM

## 2018-11-08 DIAGNOSIS — Z9989 Dependence on other enabling machines and devices: Secondary | ICD-10-CM

## 2018-11-08 MED ORDER — POTASSIUM 99 MG PO TABS: 0.5000 | ORAL_TABLET | Freq: Every day | ORAL | Status: DC

## 2018-11-08 MED ORDER — SPIRONOLACTONE 25 MG PO TABS: 25.0000 mg | ORAL_TABLET | Freq: Two times a day (BID) | ORAL | 3 refills | Status: DC

## 2018-11-08 MED ORDER — CARVEDILOL 25 MG PO TABS: 25.0000 mg | ORAL_TABLET | Freq: Two times a day (BID) | ORAL | 3 refills | Status: DC

## 2018-11-08 MED ORDER — FENOFIBRATE 134 MG PO CAPS: 134.0000 mg | ORAL_CAPSULE | Freq: Every day | ORAL | 3 refills | Status: DC

## 2018-11-08 MED ORDER — LOSARTAN POTASSIUM 25 MG PO TABS: 25.0000 mg | ORAL_TABLET | Freq: Every day | ORAL | 3 refills | Status: DC

## 2018-11-08 MED ORDER — ROSUVASTATIN CALCIUM 40 MG PO TABS: ORAL_TABLET | ORAL | 3 refills | Status: DC

## 2018-11-08 MED ORDER — VERAPAMIL HCL ER 240 MG PO TBCR: EXTENDED_RELEASE_TABLET | ORAL | 3 refills | Status: DC

## 2018-11-08 NOTE — Patient Instructions (Addendum)
Medication Instructions:  Decrease Losartan to 25 mg daily.  Decrease Potassium to 1/2 tablet daily.  Ok to take 400 mg of Magnesium for leg cramps.  If you need a refill on your cardiac medications before your next appointment, please call your pharmacy.    Follow-Up: At [facility], you and your health needs are our priority.  As part of our continuing mission to provide you with exceptional heart care, we have created designated Provider Care Teams.  These Care Teams include your primary Cardiologist (physician) and Advanced Practice Providers (APPs -  Physician Assistants and Nurse Practitioners) who all work together to provide you with the care you need, when you need it. You will need a follow up appointment in 12 months.  Please call our office 2 months in advance to schedule this appointment.  You may see Shelva Majestic, MD or one of the following Advanced Practice Providers on your designated Care Team: [place], [place] . Fabian Sharp, PA-C

## 2018-12-04 ENCOUNTER — Other Ambulatory Visit: Payer: Self-pay | Admitting: Physician Assistant

## 2018-12-04 MED ORDER — IMIPRAMINE HCL 25 MG PO TABS: ORAL_TABLET | ORAL | 1 refills | Status: DC

## 2018-12-27 NOTE — Progress Notes (Deleted)
Complete Physical  Assessment and Plan:  Essential hypertension - continue medications, DASH diet, exercise and monitor at home. Call if greater than 130/80.  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 Talmage Nap  Diabetic autonomic neuropathy associated with type 2 diabetes mellitus (HCC) -     Hemoglobin A1c -- continue weight loss - suggest cutting back on insulin 3-5 units both AM and PM dose due to low sugars - discussed low sugars and awareness, will schedule follow up Dr. Talmage Nap  Uncontrolled type 2 diabetes mellitus with hyperglycemia, with long-term current use of insulin (HCC) -     Hemoglobin A1c - continue weight loss - suggest cutting back on insulin 3-5 units both AM and PM dose due to low sugars - discussed low sugars and awareness, will schedule follow up Dr. Talmage Nap  HYPERCHOLESTEROLEMIA -     Lipid panel -continue medications, check lipids, decrease fatty foods, increase activity.   Morbid Obesity with co morbidities - long discussion about weight loss, diet, and exercise  Other migraine without status migrainosus, not intractable Avoid triggers  Gastroesophageal reflux disease, esophagitis presence not specified Continue PPI/H2 blocker, diet discussed  Diastolic dysfunction Continue weight loss  Biventricular cardiac pacemaker in situ Continue cardio  follow up  Generalized anxiety disorder Continue meds  Needs flu shot -     FLU VACCINE MDCK QUAD W/Preservative  Medication management -     Magnesium  Vitamin D deficiency -     VITAMIN D 25 Hydroxy (Vit-D Deficiency, Fractures)  Anemia, unspecified type -     Iron,Total/Total Iron Binding Cap -     Vitamin B12   Discussed med's effects and SE's. Screening labs and tests as requested with regular follow-up as recommended. Future Appointments  Date Time Provider Department Center  01/01/2019  3:00 PM Quentin Mulling, [place] [facility] None  01/02/2019  9:10 AM [facility] DEVICE REMOTES [facility]  04/03/2019  9:10 AM [facility] DEVICE REMOTES [facility]  07/03/2019  9:10 AM [facility] DEVICE REMOTES [facility]  10/02/2019  9:10 AM [facility] DEVICE REMOTES [facility]  01/01/2020  9:10 AM [facility] DEVICE REMOTES [facility]  01/02/2020  2:00 PM Quentin Mulling, PA-C [facility] None    HPI 63 y.o. female  presents for a complete physical.  She had hematuria and has kidney stone, also had nodule on her bladder, following with Dr. Alvester Morin at [facility].  Her blood pressure has been controlled at home, today their BP is   She does not workout regularly, pulled something in her back so has not last few days.  She denies chest pain, shortness of breath, dizziness.  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 Tresa Endo, follows with them regularly. She is on BB, ARB, spirolactone, and torsemide.  She is on cholesterol medication, crestor 40mg  and fenofibrate and denies myalgias. Her cholesterol is at goal. The cholesterol last visit was:   Lab Results  Component Value Date   CHOL 97 12/22/2017   HDL 28 (L) 12/22/2017   LDLCALC 44 12/22/2017   TRIG 170 (H) 12/22/2017   CHOLHDL 3.5 12/22/2017   She has been working on diet and exercise for  Diabetes with diabetic chronic kidney disease, with other circulatory complications and with diabetic polyneuropathy. She follows up with Dr. Talmage Nap for her uncontrolled DM, she is on insulin, 70/30 doing 40-40 BID. She see's Dr. Hazle Quant for eye exams, she is on bASA, she is on ACE/ARB, and denies hypoglycemia , polydipsia, polyuria and visual disturbances. Does have paresthesias in bilateral feet/legs.   Last A1C was:  Lab Results  Component Value Date   HGBA1C 10.8 (H) 12/22/2017  Patient is on Vitamin D supplement.  Lab Results  Component Value Date   VD25OH 42 12/22/2017  Has history of migraines.  She follows with Dr. Nicholas Lose, has had several seb cyst around breast.  Mobid obesity with noncompliance- BMI is There is no height or weight on file to calculate BMI., she is working on diet and exercise. She has OSA and is on CPAP  Wt Readings from Last 3 Encounters:  11/08/18 211 lb (95.7 kg)  05/16/18 235 lb 3.2 oz (106.7 kg)  03/23/18 231 lb 3.2 oz (104.9 kg)   Current Medications:  Current Outpatient Medications on File Prior to Visit  Medication Sig Dispense Refill  . aspirin 81 MG tablet Take 162 mg by mouth at bedtime.     Marland Kitchen aspirin-acetaminophen-caffeine (EXCEDRIN MIGRAINE) 250-250-65 MG per tablet Take 2 tablets by mouth daily as needed for migraine.    . carvedilol (COREG) 25 MG tablet Take 1 tablet (25 mg total) by mouth 2 (two) times daily with a meal. 180 tablet 3  . diazepam (VALIUM) 5 MG tablet Take 1/2 to 1 tablet at Bedtime ONLY if needed 30 tablet 0  . doxycycline (VIBRAMYCIN) 100 MG capsule Take 1 capsule twice daily with food 20 capsule 0  . estradiol (ESTRACE) 1 MG tablet Take 1.5 tablets by mouth daily.  4  . fenofibrate micronized (LOFIBRA) 134 MG capsule Take 1 capsule (134 mg total) by mouth daily before breakfast. 90 capsule 3  . imipramine (TOFRANIL) 25 MG tablet Take 1 to 2 tablets daily as directed 180 tablet 1  . loratadine (CLARITIN) 10 MG tablet Take 10 mg by  mouth daily.     Marland Kitchen losartan (COZAAR) 25 MG tablet Take 1 tablet (25 mg total) by mouth daily. 90 tablet 3  . metFORMIN (GLUMETZA) 500 MG (MOD) 24 hr tablet Take 1,000 mg by mouth 2 (two) times daily with a meal.     . NOVOLIN 70/30 RELION (70-30) 100 UNIT/ML injection Inject 30 Units into the skin 2 (two) times daily. Currently doing 20 units bid  0  . phentermine (ADIPEX-P) 37.5 MG tablet TAKE 1 TABLET(37.5 MG) BY MOUTH DAILY BEFORE BREAKFAST 30 tablet 0  . Potassium 99 MG TABS Take 0.5 tablets (49.5 mg total) by mouth daily. 330 tablet   . predniSONE (DELTASONE) 20 MG tablet 2 tablets daily for 3 days, 1 tablet daily for 4 days. 10 tablet 0  . rizatriptan (MAXALT) 10 MG tablet Take 1 tablet (10 mg total) by mouth as needed for migraine. May repeat in 2 hours if needed 10 tablet 0  . rosuvastatin (CRESTOR) 40 MG  tablet TAKE 1 TABLET(40 MG) BY MOUTH DAILY. 90 tablet 3  . spironolactone (ALDACTONE) 25 MG tablet Take 1 tablet (25 mg total) by mouth 2 (two) times daily. 180 tablet 3  . traMADol (ULTRAM) 50 MG tablet Take 50 mg by mouth as directed.    . valACYclovir (VALTREX) 500 MG tablet TAKE 1 TABLET(500 MG) BY MOUTH DAILY 90 tablet 0  . verapamil (CALAN-SR) 240 MG CR tablet TAKE 1 TABLET(240 MG) BY MOUTH AT BEDTIME 90 tablet 3   No current facility-administered medications on file prior to visit.    Health Maintenance:   Immunization History  Administered Date(s) Administered  . Influenza Inj Mdck Quad With Preservative 11/22/2016, 12/22/2017  . Influenza-Unspecified 11/22/2014  . Tdap 03/24/2012   Tetanus: 2014 Pneumovax: declines Prevnar 13: due age 52 Flu vaccine: 2018 TODAY Zostavax: N/A  Pap: Sept 2017 Dr. Carlis Abbott  Normal, getting every 3 years MGM: sept 2018 has done at [facility]: 2006 t -1.3 DUE, declines at this time Colonoscopy: 11/09/2016  EGD: Dec 2007 Stress test 2016 Echo 05/2016 US soft tissues: 08/2013 CT AB 07/2017 Sleep study 2011  Patient Care  Team: Unk Pinto, MD as PCP - General (Internal Medicine) Troy Sine, MD as PCP - Cardiology (Cardiology) Troy Sine, MD as Consulting Physician (Cardiology) Charlies Silvers, MD as Consulting Physician (Pediatrics) Ronald Lobo, MD as Consulting Physician (Gastroenterology) Jacelyn Pi, MD as Consulting Physician (Endocrinology) Gus Height, MD (Inactive) as Consulting Physician (Obstetrics and Gynecology) Rolm Bookbinder, MD as Consulting Physician (Dermatology)   Medical History:  Past Medical History:  Diagnosis Date  . Automatic implantable cardiac defibrillator in [place]  . CHF (congestive heart failure) ([place])   . Diabetes mellitus   . Hyperlipidemia   . Hypertension 06/25/2011   Renal dopplers - superior mesenteric artery >50% diameter reduction; R renal artery - normal patency; L proximal renal artery 1-59% reduction (lower end of scale); both kidneys normal in size/symmetry with normal cortex and medulla  . Leg pain 12/09/2010   doppler of R femoral artery - no evidence of dissection, AV fistula, pseudoaneurysm or other vascular abnormalities  . Migraines   . Obesity   . Sleep apnea    on CPAP - AHI during sleep study was 44  . Type II or unspecified type diabetes mellitus without mention of complication, not stated as uncontrolled   . Type II or unspecified type diabetes mellitus without mention of complication, not stated as uncontrolled   . Unspecified sleep apnea     Allergies  Allergen Reactions  . Flagyl [Metronidazole] Anaphylaxis  . Niacin And Related Other (See Comments) and Cough    flushing  . Penicillins Swelling    Has patient had a PCN reaction causing immediate rash, facial/tongue/throat swelling, SOB or lightheadedness with hypotension: No Has patient had a PCN reaction causing severe rash involving mucus membranes or skin necrosis: No Has patient had a PCN reaction that required hospitalization No Has patient  had a PCN reaction occurring within the last 10 years: Yes 3-4 years ago If all of the above answers are "NO", then may proceed with Cephalosporin use.  . Yeast-Related Products Swelling and Rash     SURGICAL HISTORY She  has a past surgical history that includes Abdominal hysterectomy; Neck surgery; Pacemaker insertion; Cardiac defibrillator placement; Knee surgery; doppler echocardiography (06/25/2011); Cardiovascular stress test (08/28/2010); Cardiac catheterization (07/15/2006); and Biv pacemaker generator change out (N/A, 01/16/2014). FAMILY HISTORY Her  family history includes Cancer in her maternal aunt, maternal grandfather, maternal uncle, and mother; Hearing loss in her father; Heart attack in her father; Hypertension in her father. SOCIAL HISTORY She  reports that she quit smoking about 43 years ago. Her smoking use included cigarettes. She has never used smokeless tobacco. She reports current alcohol use. She reports that she does not use drugs.  Review of Systems  Constitutional: Negative.   HENT: Negative.   Respiratory: Negative for cough, hemoptysis, sputum production, shortness of breath and wheezing.   Cardiovascular: Negative.   Gastrointestinal: Negative for abdominal pain, blood in stool, constipation, diarrhea, heartburn, melena, nausea and vomiting.  Genitourinary: Negative.   Musculoskeletal: Negative for back pain, falls, joint pain, myalgias and neck pain.  Skin: Negative for itching and rash.  Neurological: Negative for dizziness, tingling, tremors, sensory change, speech change, focal weakness, seizures and loss of consciousness.  Psychiatric/Behavioral: Negative.     Physical Exam: Estimated body mass index is 33.05 kg/m as calculated from the following:   Height as of 11/08/18: 5\' 7"  (1.702 m).   Weight as of 11/08/18: 211 lb (95.7 kg). There were no vitals taken for this visit. General Appearance: Well nourished, in no apparent distress. Eyes: PERRLA, EOMs,  conjunctiva no swelling or erythema, normal fundi and vessels. Sinuses: No Frontal/maxillary tenderness ENT/Mouth: Ext aud canals clear, normal light reflex with TMs without erythema, bulging.  Good dentition. No erythema, swelling, or exudate on post pharynx. Tonsils not swollen or erythematous. Hearing normal.  Neck: Supple, thyroid normal. No bruits Respiratory: Respiratory effort normal, BS equal bilaterally without rales, rhonchi, wheezing or stridor. Cardio: RRR without murmurs, rubs or gallops. Brisk peripheral pulses without edema, with bilateral hard palpable cords without erythema, warmth, tenderness.  Chest: symmetric, with normal excursions and percussion. Breasts: defer Abdomen: obese, soft, nontender without organomegaly.   Lymphatics: Non tender without lymphadenopathy.  Genitourinary: defer Musculoskeletal: Full ROM all peripheral extremities,5/5 strength, and normal gait. Skin: Warm, dry without rashes, lesions, ecchymosis.  Neuro: Cranial nerves intact, reflexes equal bilaterally. Normal muscle tone, no cerebellar symptoms. Sensation decreased bilateral feet to 2/3 up shin.  Psych: Awake and oriented X 3, normal affect, Insight and Judgment appropriate.   Quentin MullingAmanda Azariah [place] 1:47 PM

## 2018-12-28 ENCOUNTER — Other Ambulatory Visit: Payer: Self-pay | Admitting: Obstetrics

## 2018-12-28 DIAGNOSIS — R928 Other abnormal and inconclusive findings on diagnostic imaging of breast: Secondary | ICD-10-CM

## 2019-01-01 ENCOUNTER — Encounter: Payer: Self-pay | Admitting: Physician Assistant

## 2019-01-02 ENCOUNTER — Ambulatory Visit (INDEPENDENT_AMBULATORY_CARE_PROVIDER_SITE_OTHER): Payer: BC Managed Care – PPO | Admitting: *Deleted

## 2019-01-02 DIAGNOSIS — I428 Other cardiomyopathies: Secondary | ICD-10-CM

## 2019-01-02 DIAGNOSIS — I5022 Chronic systolic (congestive) heart failure: Secondary | ICD-10-CM | POA: Diagnosis not present

## 2019-01-02 LAB — CUP PACEART REMOTE DEVICE CHECK
Battery Remaining Longevity: 95 mo
Battery Remaining Percentage: 95.5 %
Battery Voltage: 2.96 V
Brady Statistic AP VP Percent: 6.1 %
Brady Statistic AP VS Percent: 1 %
Brady Statistic AS VP Percent: 94 %
Brady Statistic AS VS Percent: 1 %
Brady Statistic RA Percent Paced: 5.9 %
Date Time Interrogation Session: 20201027111535
Implantable Lead Implant Date: 20091016
Implantable Lead Implant Date: 20091016
Implantable Lead Implant Date: 20091016
Implantable Lead Location: 753858
Implantable Lead Location: 753859
Implantable Lead Location: 753860
Implantable Lead Model: 4196
Implantable Lead Model: 7122
Implantable Pulse Generator Implant Date: 20151111
Lead Channel Impedance Value: 390 Ohm
Lead Channel Impedance Value: 460 Ohm
Lead Channel Impedance Value: 810 Ohm
Lead Channel Pacing Threshold Amplitude: 0.5 V
Lead Channel Pacing Threshold Amplitude: 1 V
Lead Channel Pacing Threshold Amplitude: 1.25 V
Lead Channel Pacing Threshold Pulse Width: 0.3 ms
Lead Channel Pacing Threshold Pulse Width: 0.3 ms
Lead Channel Pacing Threshold Pulse Width: 0.6 ms
Lead Channel Sensing Intrinsic Amplitude: 12 mV
Lead Channel Sensing Intrinsic Amplitude: 3.2 mV
Lead Channel Setting Pacing Amplitude: 2 V
Lead Channel Setting Pacing Amplitude: 2.25 V
Lead Channel Setting Pacing Amplitude: 2.5 V
Lead Channel Setting Pacing Pulse Width: 0.3 ms
Lead Channel Setting Pacing Pulse Width: 0.6 ms
Lead Channel Setting Sensing Sensitivity: 2 mV
Pulse Gen Model: 3222
Pulse Gen Serial Number: 7688750

## 2019-01-03 ENCOUNTER — Ambulatory Visit
Admission: RE | Admit: 2019-01-03 | Discharge: 2019-01-03 | Disposition: A | Discharge status: Home / Self Care | Payer: BC Managed Care – PPO | Source: Ambulatory Visit | Attending: Obstetrics | Admitting: Obstetrics

## 2019-01-03 ENCOUNTER — Other Ambulatory Visit: Payer: Self-pay

## 2019-01-03 DIAGNOSIS — R928 Other abnormal and inconclusive findings on diagnostic imaging of breast: Secondary | ICD-10-CM

## 2019-01-23 NOTE — Progress Notes (Signed)
Remote pacemaker transmission.   

## 2019-01-24 ENCOUNTER — Other Ambulatory Visit: Payer: Self-pay | Admitting: Physician Assistant

## 2019-01-24 NOTE — Progress Notes (Signed)
Complete Physical  Assessment and Plan:  Essential hypertension - continue medications, DASH diet, exercise and monitor at home. Call if greater than 130/80.  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 Talmage Nap  Diabetic autonomic neuropathy associated with type 2 diabetes mellitus (HCC) -     Hemoglobin A1c -- continue weight loss - discussed low sugars and awareness, will schedule follow up Dr. Talmage Nap  Uncontrolled type 2 diabetes mellitus with hyperglycemia, with long-term current use of insulin (HCC) -     Hemoglobin A1c - continue weight loss - discussed low sugars and awareness, will schedule follow up Dr. Talmage Nap  HYPERCHOLESTEROLEMIA -     Lipid panel -continue medications, check lipids, decrease fatty foods, increase activity.   Morbid Obesity with co morbidities - long discussion about weight loss, diet, and exercise  Other migraine without status migrainosus, not intractable Avoid triggers  Gastroesophageal reflux disease, esophagitis presence not specified Continue PPI/H2 blocker, diet discussed  Diastolic dysfunction Continue weight loss  Biventricular cardiac pacemaker in situ Continue cardio follow up  Generalized anxiety disorder Continue meds  Medication management -     Magnesium  Vitamin D deficiency -     VITAMIN D 25 Hydroxy (Vit-D Deficiency, Fractures)  Anemia, unspecified type -      Iron,Total/Total Iron Binding Cap -     Vitamin B12   Discussed med's effects and SE's. Screening labs and tests as requested with regular follow-up as recommended. Future Appointments  Date Time Provider Department Center  04/03/2019  9:10 AM [facility] DEVICE REMOTES [facility]  07/03/2019  9:10 AM [facility] DEVICE REMOTES [facility]  10/02/2019  9:10 AM [facility] DEVICE REMOTES [facility]  01/01/2020  9:10 AM [facility] DEVICE REMOTES [facility]  01/29/2020  3:00 PM Quentin Mulling, PA-C [facility] None    HPI 63 y.o. female  presents for a complete physical.  Her blood pressure has been controlled at home, today their BP is BP: 126/74 She does workout, does fitness walk program inside.   She denies chest pain, shortness of breath, dizziness.  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 Tresa Endo.  Last echo was 02/2018 with EF 50-55% She is on BB, ARB, spirolactone, and torsemide- recenlty decreased due to hypotension from intentional weight loss.  BMI is Body mass index is 30.51 kg/m., she is working on diet and exercise. She has OSA and is on CPAP.  Wt Readings from Last 3 Encounters:  01/29/19 186 lb 3.2 oz (84.5 kg)  01/26/19 187 lb (84.8 kg)  11/08/18 211 lb (95.7 kg)   She has been working on diet and exercise for Diabetes, she is off insulin due to weight loss With CKD she is on ACE/ARB on cozaar 25mg .  With hyperlipidemia is on crestor 40 and lopid, at goal less than 70.  With neuropathy With CAD she is on bASA  She follows up with Dr. Talmage NapBalan for her DM She is off insulin at this time, just on metformin at this time  She see's Dr. Hazle Quantigby for eye exams 11/2018 normal Meter: freestyle libre denies hypoglycemia , polydipsia, polyuria and visual disturbances.  Does have paresthesias in bilateral feet/legs.  Last A1C was below 7 with Dr. Talmage NapBalan  in the spring.   Last A1C was:  Lab Results  Component Value Date   HGBA1C 10.8 (H) 12/22/2017   Lab Results  Component Value Date   GFRNONAA 55 (L) 12/22/2017   Lab Results  Component Value Date   CHOL 97 12/22/2017   HDL 28 (L) 12/22/2017   LDLCALC 44 12/22/2017   TRIG 170 (H) 12/22/2017   CHOLHDL 3.5 12/22/2017   Patient is on Vitamin D supplement.  Lab Results  Component Value Date   VD25OH 42 12/22/2017  Has history of migraines.  She follows with Dr. Nicholas LoseLomax, has had several seb cyst around breast.   Lab Results  Component Value Date   VITAMINB12 453 12/22/2017    Current Medications:  Current Outpatient Medications on File Prior to Visit  Medication Sig Dispense Refill  . aspirin 81 MG tablet Take 162 mg by mouth at bedtime.     Marland Kitchen. aspirin-acetaminophen-caffeine (EXCEDRIN MIGRAINE) 250-250-65 MG per tablet Take 2 tablets by mouth daily as needed for migraine.    Marland Kitchen. azithromycin (ZITHROMAX) 250 MG tablet Take 2 tablets (500 mg) on  Day 1,  followed by 1 tablet (250 mg) once daily on Days 2 through 5. 6 each 1  . carvedilol (COREG) 25 MG tablet Take 1 tablet (25 mg total) by mouth 2 (two) times daily with a meal. 180 tablet 3  . diazepam (VALIUM) 5 MG tablet Take 1/2 to 1 tablet at Bedtime ONLY if needed 30 tablet 0  . doxycycline (VIBRAMYCIN) 100 MG capsule Take 1 capsule twice daily with food 20 capsule 0  . estradiol (ESTRACE) 1 MG tablet Take 1.5 tablets by mouth daily.  4  . fenofibrate micronized (LOFIBRA) 134 MG capsule Take 1 capsule (134 mg total) by mouth daily before breakfast. 90 capsule 3  . imipramine (TOFRANIL) 25 MG tablet Take 1 to 2 tablets daily as directed 180 tablet 1  . loratadine (CLARITIN) 10 MG tablet Take 10 mg by mouth daily.     Marland Kitchen. losartan (COZAAR) 25 MG tablet Take 1 tablet (25 mg total) by mouth daily. 90 tablet 3  . metFORMIN (GLUMETZA) 500 MG (MOD) 24 hr tablet Take 1,000 mg by mouth 2 (two) times daily with a meal.     . NOVOLIN 70/30  RELION (70-30) 100 UNIT/ML injection Inject 30 Units into the skin 2 (two) times daily. Currently doing 20 units bid  0  . phentermine (ADIPEX-P) 37.5 MG tablet TAKE 1 TABLET(37.5 MG) BY MOUTH DAILY BEFORE BREAKFAST 30 tablet 0  . Potassium 99 MG TABS Take 0.5 tablets (49.5 mg total) by mouth daily. 330 tablet   . predniSONE (DELTASONE) 20 MG tablet 2 tablets daily for 3 days, 1 tablet daily for 4 days. 10 tablet 0  . rizatriptan (MAXALT) 10 MG tablet Take 1 tablet (10 mg total) by mouth as needed for migraine. May repeat in 2 hours if needed 10 tablet 0  . rosuvastatin (CRESTOR) 40 MG tablet TAKE 1 TABLET(40 MG) BY MOUTH DAILY. 90 tablet 3  . spironolactone (ALDACTONE) 25 MG tablet Take 1 tablet (25 mg  total) by mouth 2 (two) times daily. 180 tablet 3  . traMADol (ULTRAM) 50 MG tablet Take 50 mg by mouth as directed.    . valACYclovir (VALTREX) 500 MG tablet TAKE 1 TABLET(500 MG) BY MOUTH DAILY 90 tablet 0  . verapamil (CALAN-SR) 240 MG CR tablet TAKE 1 TABLET(240 MG) BY MOUTH AT BEDTIME 90 tablet 3   No current facility-administered medications on file prior to visit.    Health Maintenance:   Immunization History  Administered Date(s) Administered  . Influenza Inj Mdck Quad Pf 01/02/2019  . Influenza Inj Mdck Quad With Preservative 11/22/2016, 12/22/2017  . Influenza,inj,Quad PF,6+ Mos 12/06/2017  . Influenza-Unspecified 12/06/2013, 11/22/2014  . Tdap 03/24/2012   Tetanus: 2014 Pneumovax: declines Prevnar 13: due age 63 Flu vaccine: 2020 Zostavax: N/A  Pap: Sept 2020 Dr. Chestine Spore  Normal, getting every 3 years MGM: 12/2018 has done at [facility]  DEXA: 2006 t -1.3 , declines at this time- will get at 66 Colonoscopy: 11/09/2016  EGD: Dec 2007 Stress test 2016 Echo 02/2018 US soft tissues: 08/2013 CT AB 07/2017 Sleep study 2011  Patient Care Team: Lucky Cowboy, MD as PCP - General (Internal Medicine) Lennette Bihari, MD as PCP - Cardiology (Cardiology) Lennette Bihari, MD as  Consulting Physician (Cardiology) Fletcher Anon, MD as Consulting Physician (Pediatrics) Bernette Redbird, MD as Consulting Physician (Gastroenterology) Dorisann Frames, MD as Consulting Physician (Endocrinology) Miguel Aschoff, MD (Inactive) as Consulting Physician (Obstetrics and Gynecology) Venancio Poisson, MD as Consulting Physician (Dermatology)   Medical History:  Past Medical History:  Diagnosis Date  . Automatic implantable cardiac defibrillator in situ    [employer] MODEL 7122  . CHF (congestive heart failure) (HCC)   . Diabetes mellitus   . Hyperlipidemia   . Hypertension 06/25/2011   Renal dopplers - superior mesenteric artery >50% diameter reduction; R renal artery - normal patency; L proximal renal artery 1-59% reduction (lower end of scale); both kidneys normal in size/symmetry with normal cortex and medulla  . Leg pain 12/09/2010   doppler of R femoral artery - no evidence of dissection, AV fistula, pseudoaneurysm or other vascular abnormalities  . Migraines   . Obesity   . Sleep apnea    on CPAP - AHI during sleep study was 44  . Type II or unspecified type diabetes mellitus without mention of complication, not stated as uncontrolled   . Type II or unspecified type diabetes mellitus without mention of complication, not stated as uncontrolled   . Unspecified sleep apnea     Allergies  Allergen Reactions  . Flagyl [Metronidazole] Anaphylaxis  . Niacin And Related Other (See Comments) and Cough    flushing  . Penicillins Swelling    Has patient had a PCN reaction causing immediate rash, facial/tongue/throat swelling, SOB or lightheadedness with hypotension: No Has patient had a PCN reaction causing severe rash involving mucus membranes or skin necrosis: No Has patient had a PCN reaction that required hospitalization No Has patient had a PCN reaction occurring within the last 10 years: Yes 3-4 years ago If all of the above answers are "NO", then may proceed with  Cephalosporin use.  . Yeast-Related Products Swelling and Rash     SURGICAL HISTORY She  has a past surgical history that includes Abdominal hysterectomy; Neck surgery; Pacemaker insertion; Cardiac defibrillator placement; Knee surgery; doppler echocardiography (06/25/2011); Cardiovascular stress test (08/28/2010); Cardiac catheterization (07/15/2006); and Biv pacemaker generator change out (N/A, 01/16/2014). FAMILY HISTORY Her family history includes Cancer in her maternal  aunt, maternal grandfather, maternal uncle, and mother; Hearing loss in her father; Heart attack in her father; Hypertension in her father. SOCIAL HISTORY She  reports that she quit smoking about 43 years ago. Her smoking use included cigarettes. She has never used smokeless tobacco. She reports current alcohol use. She reports that she does not use drugs. Has not had alcohol in 8 months.   Review of Systems  Constitutional: Negative.   HENT: Negative.   Respiratory: Negative for cough, hemoptysis, sputum production, shortness of breath and wheezing.   Cardiovascular: Negative.   Gastrointestinal: Negative for abdominal pain, blood in stool, constipation, diarrhea, heartburn, melena, nausea and vomiting.  Genitourinary: Negative.   Musculoskeletal: Negative for back pain, falls, joint pain, myalgias and neck pain.  Skin: Negative for itching and rash.  Neurological: Negative for dizziness, tingling, tremors, sensory change, speech change, focal weakness, seizures and loss of consciousness.  Psychiatric/Behavioral: Negative.     Physical Exam: Estimated body mass index is 30.51 kg/m as calculated from the following:   Height as of this encounter: 5' 5.5" (1.664 m).   Weight as of this encounter: 186 lb 3.2 oz (84.5 kg). BP 126/74   Pulse 71   Temp 97.7 F (36.5 C)   Ht 5' 5.5" (1.664 m)   Wt 186 lb 3.2 oz (84.5 kg)   SpO2 98%   BMI 30.51 kg/m  General Appearance: Well nourished, in no apparent distress. Eyes:  PERRLA, EOMs, conjunctiva no swelling or erythema, normal fundi and vessels. Sinuses: No Frontal/maxillary tenderness ENT/Mouth: Ext aud canals clear, normal light reflex with TMs without erythema, bulging.  Good dentition. No erythema, swelling, or exudate on post pharynx. Tonsils not swollen or erythematous. Hearing normal.  Neck: Supple, thyroid normal. No bruits Respiratory: Respiratory effort normal, BS equal bilaterally without rales, rhonchi, wheezing or stridor. Cardio: RRR without murmurs, rubs or gallops. Brisk peripheral pulses without edema, with bilateral hard palpable cords without erythema, warmth, tenderness.  Chest: symmetric, with normal excursions and percussion. Breasts: defer Abdomen: obese, soft, nontender without organomegaly.   Lymphatics: Non tender without lymphadenopathy.  Genitourinary: defer Musculoskeletal: Full ROM all peripheral extremities,5/5 strength, and normal gait. Skin: Warm, dry without rashes, lesions, ecchymosis.  Neuro: Cranial nerves intact, reflexes equal bilaterally. Normal muscle tone, no cerebellar symptoms. Sensation decreased bilateral feet to 2/3 up shin.  Psych: Awake and oriented X 3, normal affect, Insight and Judgment appropriate.   Vicie Mutters 3:25 PM

## 2019-01-26 ENCOUNTER — Other Ambulatory Visit: Payer: Self-pay

## 2019-01-26 ENCOUNTER — Encounter: Payer: Self-pay | Admitting: Physician Assistant

## 2019-01-26 ENCOUNTER — Ambulatory Visit (INDEPENDENT_AMBULATORY_CARE_PROVIDER_SITE_OTHER): Payer: BC Managed Care – PPO | Admitting: Physician Assistant

## 2019-01-26 VITALS — BP 108/64 | HR 73 | Temp 97.0°F | Wt 187.0 lb

## 2019-01-26 DIAGNOSIS — Z20822 Contact with and (suspected) exposure to covid-19: Secondary | ICD-10-CM

## 2019-01-26 DIAGNOSIS — R05 Cough: Secondary | ICD-10-CM | POA: Diagnosis not present

## 2019-01-26 DIAGNOSIS — R059 Cough, unspecified: Secondary | ICD-10-CM

## 2019-01-26 MED ORDER — AZITHROMYCIN 250 MG PO TABS: ORAL_TABLET | ORAL | 1 refills | Status: AC

## 2019-01-26 MED ORDER — PHENTERMINE HCL 37.5 MG PO TABS: ORAL_TABLET | ORAL | 0 refills | Status: DC

## 2019-01-26 NOTE — Patient Instructions (Signed)
INFORMATION ABOUT YOUR XRAY  Can walk into [address] for an Insurance account manager. They will have the order and take you back. You do not any paper work, I should get the result back today or tomorrow. This order is good for a year.  Can call [phone] to schedule an appointment if you wish.   The testing sites are open from 10-3, Monday-Friday. Due to the testing being walk-up/drive-up the sites request that the pt's are in line to have testing by 3:00 pm. The pt's will remain in the car and wear a mask when going for testing.The staff will come to the car to perform testing. The pt's will need to bring an ID. People are getting results in 24-72 hours.  During this time please quarantine you and your family.   [place] ([place])  [address]   The testing sites are also listed on the [employer] as well.   To stop home isolation IF you test positive, you will need all three of the statements below.  1) You need to be fever free for 3 days WITHOUT tylenol.  2) Your symptoms such as cough, shortness of breath, diarrhea, etc need to improve.  3) It needs to be at least 10 days since your symptoms first appeared    After you stop home isolation, please continue to wear a mask in public and continue hand washing and contact precautions.   Things to do if you test positive: Please do vitamin C 1000mg  twice a day- stop if you have any worsening heart burn or diarrhea.   You can take tylenol for fevers above 101 if you feel uncomfortable. Remember a fever itself is not bad, the tylenol is more for your comfort. See the max of tylenol below.  Please do breathing exercises. Make sure that you are taking deep breaths and trying to walk around the room as much as possible.  Please lay "prone" or on your belly for up to 4 hours a day, you can split this up to 30 minute  or 1 hour increments but this helps get oxygen to certain places of your lungs.  Please get on 81 mg of aspirin unless you are unable to tolerate this or have a contraindication.  Please pump your fee like your are pedaling a bike to prevent clots in your legs.   Push fluids with Gatorade or electrolyte replacement in addition to water, aim for 100-120 oz a day if no contraindications.  If you have worsening shortness of breath, unable to complete full sentences, are panting, can not hold your breath for longer than 25 seconds please contact the office immediately for call 911. Let them know you are being monitored for coronavirus.   HOW TO TREAT VIRAL COUGH AND COLD SYMPTOMS:  -Symptoms usually last at least 1 week with the worst symptoms being around day 4.  - colds usually start with a sore throat and end with a cough, and the cough can take 2 weeks to get better.  -No antibiotics are needed for colds, flu, sore throats, cough, bronchitis UNLESS symptoms are longer than 7 days OR if you are getting better then get drastically worse.  -There are a lot of combination medications (Dayquil, Nyquil, Vicks 44, tyelnol cold and sinus, ETC). Please look at the ingredients on the back so that you are treating the correct symptoms and not doubling up  on medications/ingredients.    Medicines you can use  Nasal congestion  Little Remedies saline spray (aerosol/mist)- can try this, it is in the kids section - pseudoephedrine (Sudafed)- behind the counter, do not use if you have high blood pressure, medicine that have -D in them.  - phenylephrine (Sudafed PE) -Dextormethorphan + chlorpheniramine (Coridcidin HBP)- okay if you have high blood pressure -Oxymetazoline (Afrin) nasal spray- LIMIT to 3 days -Saline nasal spray -Neti pot (used distilled or bottled water)  Ear pain/congestion  -pseudoephedrine (sudafed) - Nasonex/flonase nasal spray  Fever  -Acetaminophen (Tyelnol) -Ibuprofen (Advil,  motrin, aleve)  Sore Throat  -Acetaminophen (Tyelnol) -Ibuprofen (Advil, motrin, aleve) -Drink a lot of water -Gargle with salt water - Rest your voice (don't talk) -Throat sprays -Cough drops  Body Aches  -Acetaminophen (Tyelnol) -Ibuprofen (Advil, motrin, aleve)  Headache  -Acetaminophen (Tyelnol) -Ibuprofen (Advil, motrin, aleve) - Exedrin, Exedrin Migraine  Allergy symptoms (cough, sneeze, runny nose, itchy eyes) -Claritin or loratadine cheapest but likely the weakest  -Zyrtec or certizine at night because it can make you sleepy -The strongest is allegra or fexafinadine  Cheapest at [employer], [employer], [employer]  Cough  -Dextromethorphan (Delsym)- medicine that has DM in it -Guafenesin (Mucinex/Robitussin) - cough drops - drink lots of water  Chest Congestion  -Guafenesin (Mucinex/Robitussin)  Red Itchy Eyes  - Naphcon-A  Upset Stomach  - Bland diet (nothing spicy, greasy, fried, and high acid foods like tomatoes, oranges, berries) -OKAY- cereal, bread, soup, crackers, rice -Eat smaller more frequent meals -reduce caffeine, no alcohol -Loperamide (Imodium-AD) if diarrhea -Prevacid for heart burn  General health when sick  -Hydration -wash your hands frequently -keep surfaces clean -change pillow cases and sheets often -Get fresh air but do not exercise strenuously -Vitamin D, double up on it - Vitamin C -Zinc

## 2019-01-26 NOTE — Progress Notes (Signed)
Subjective:    Patient ID: [name], female    DOB: [birth year], 63 y.o.   MRN: [id number]  HPI 63 y.o. obese WF with history of CHF, DM2, OSA on CPAP presents with cough. She states she woke up this AM with cough non producitve, with trouble getting a good full breath, with "rattling". She is doing better now. No mucus, no SOB, or CP.  She has not been tested for COVID, denies exposure.   She has sinus drainage that is normal, denies chest tightness, fever chills, sore throat.  Weight is stable.  She denies edema, PND, has orthopnea unchanged.    CXR 03/2016- need repeat  BMI is Body mass index is 29.29 kg/m., she is working on diet and exercise, her weight is down 50 lbs, she is reducing calories and exercising. She is off insulin.  Wt Readings from Last 3 Encounters:  01/26/19 187 lb (84.8 kg)  11/08/18 211 lb (95.7 kg)  05/16/18 235 lb 3.2 oz (106.7 kg)   Blood pressure 108/64, pulse 73, temperature (!) 97 F (36.1 C), weight 187 lb (84.8 kg), SpO2 99 %.  Medications Current Outpatient Medications on File Prior to Visit  Medication Sig  . aspirin 81 MG tablet Take 162 mg by mouth at bedtime.   Marland Kitchen aspirin-acetaminophen-caffeine (EXCEDRIN MIGRAINE) 250-250-65 MG per tablet Take 2 tablets by mouth daily as needed for migraine.  . carvedilol (COREG) 25 MG tablet Take 1 tablet (25 mg total) by mouth 2 (two) times daily with a meal.  . diazepam (VALIUM) 5 MG tablet Take 1/2 to 1 tablet at Bedtime ONLY if needed  . estradiol (ESTRACE) 1 MG tablet Take 1.5 tablets by mouth daily.  . fenofibrate micronized (LOFIBRA) 134 MG capsule Take 1 capsule (134 mg total) by mouth daily before breakfast.  . imipramine (TOFRANIL) 25 MG tablet Take 1 to 2 tablets daily as directed  . loratadine (CLARITIN) 10 MG tablet Take 10 mg by mouth daily.   Marland Kitchen losartan (COZAAR) 25 MG tablet Take 1 tablet (25 mg total) by mouth daily.  . metFORMIN (GLUMETZA) 500 MG (MOD) 24 hr tablet Take 1,000 mg by  mouth 2 (two) times daily with a meal.   . phentermine (ADIPEX-P) 37.5 MG tablet TAKE 1 TABLET(37.5 MG) BY MOUTH DAILY BEFORE BREAKFAST  . Potassium 99 MG TABS Take 0.5 tablets (49.5 mg total) by mouth daily.  . rizatriptan (MAXALT) 10 MG tablet Take 1 tablet (10 mg total) by mouth as needed for migraine. May repeat in 2 hours if needed  . rosuvastatin (CRESTOR) 40 MG tablet TAKE 1 TABLET(40 MG) BY MOUTH DAILY.  Marland Kitchen spironolactone (ALDACTONE) 25 MG tablet Take 1 tablet (25 mg total) by mouth 2 (two) times daily.  . traMADol (ULTRAM) 50 MG tablet Take 50 mg by mouth as directed.  . valACYclovir (VALTREX) 500 MG tablet TAKE 1 TABLET(500 MG) BY MOUTH DAILY  . verapamil (CALAN-SR) 240 MG CR tablet TAKE 1 TABLET(240 MG) BY MOUTH AT BEDTIME  . doxycycline (VIBRAMYCIN) 100 MG capsule Take 1 capsule twice daily with food  . NOVOLIN 70/30 RELION (70-30) 100 UNIT/ML injection Inject 30 Units into the skin 2 (two) times daily. Currently doing 20 units bid  . predniSONE (DELTASONE) 20 MG tablet 2 tablets daily for 3 days, 1 tablet daily for 4 days.   No current facility-administered medications on file prior to visit.     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  Review of Systems  Constitutional: Negative for chills, fatigue and fever.  HENT: Positive  for congestion, postnasal drip, rhinorrhea and sinus pressure. Negative for sinus pain, sore throat and trouble swallowing.   Respiratory: Positive for cough. Negative for apnea, choking, chest tightness, shortness of breath, wheezing and stridor.   Cardiovascular: Negative.  Negative for chest pain, palpitations and leg swelling.  Gastrointestinal: Negative.        Objective:   Physical Exam Constitutional:      Appearance: She is well-developed.  HENT:     Right Ear: Hearing and external ear normal. A middle ear effusion is present. No mastoid tenderness. Tympanic membrane is injected. Tympanic membrane is not perforated, erythematous, retracted or bulging.     Left Ear: Hearing and external ear normal. A middle ear effusion is present. No mastoid tenderness. Tympanic membrane is injected. Tympanic membrane is not perforated, erythematous, retracted or bulging.     Nose:     Right Sinus: Maxillary sinus tenderness present.     Left Sinus: Maxillary sinus tenderness present.     Mouth/Throat:     Pharynx: Uvula midline.  Eyes:     Conjunctiva/sclera: Conjunctivae normal.     Pupils: Pupils are equal, round, and reactive to light.  Neck:     Musculoskeletal: Neck supple.  Cardiovascular:     Rate and Rhythm: Normal rate and regular rhythm.  Pulmonary:     Effort: Pulmonary effort is normal. No respiratory distress.     Breath sounds: Normal breath sounds. No wheezing.  Abdominal:     General: Bowel sounds are normal.     Palpations: Abdomen is soft.  Musculoskeletal: Normal range of motion.  Lymphadenopathy:     Cervical: No cervical adenopathy.  Skin:    General: Skin is warm and dry.  Neurological:     Mental Status: She is alert and oriented to person, place, and time.        Assessment & Plan:    Cough Weight stable, no edema, normal breath sounds Get COVID test Patient wore a mask the entire visit, I was in mask and shield -     azithromycin (ZITHROMAX) 250 MG  tablet; Take 2 tablets (500 mg) on  Day 1,  followed by 1 tablet (250 mg) once daily on Days 2 through 5. -     fluticasone (FLONASE) 50 MCG/ACT nasal spray; Place 2 sprays into both nostrils at bedtime.  The patient was advised to call immediately if she has any concerning symptoms in the interval. The patient voices understanding of current treatment options and is in agreement with the current care plan.The patient knows to call the clinic with any problems, questions or concerns or go to the ER if any further progression of symptoms.     Future Appointments  Date Time Provider [place]  01/29/2019  3:00 PM Vicie Mutters, PA-C [facility] None  04/03/2019  9:10 AM [facility] DEVICE REMOTES [facility]  07/03/2019  9:10 AM [facility] DEVICE REMOTES [facility]  10/02/2019  9:10 AM [facility] DEVICE  REMOTES [facility]  01/01/2020  9:10 AM [facility] DEVICE REMOTES [facility]  01/29/2020  3:00 PM Quentin Mulling, PA-C [facility] None

## 2019-01-29 ENCOUNTER — Ambulatory Visit: Payer: BC Managed Care – PPO | Admitting: Physician Assistant

## 2019-01-29 ENCOUNTER — Other Ambulatory Visit: Payer: Self-pay

## 2019-01-29 ENCOUNTER — Encounter: Payer: Self-pay | Admitting: Physician Assistant

## 2019-01-29 VITALS — BP 126/74 | HR 71 | Temp 97.7°F | Ht 65.5 in | Wt 186.2 lb

## 2019-01-29 DIAGNOSIS — Z13220 Encounter for screening for lipoid disorders: Secondary | ICD-10-CM

## 2019-01-29 DIAGNOSIS — G4733 Obstructive sleep apnea (adult) (pediatric): Secondary | ICD-10-CM

## 2019-01-29 DIAGNOSIS — G43809 Other migraine, not intractable, without status migrainosus: Secondary | ICD-10-CM

## 2019-01-29 DIAGNOSIS — Z1329 Encounter for screening for other suspected endocrine disorder: Secondary | ICD-10-CM

## 2019-01-29 DIAGNOSIS — Z0000 Encounter for general adult medical examination without abnormal findings: Secondary | ICD-10-CM | POA: Diagnosis not present

## 2019-01-29 DIAGNOSIS — E7800 Pure hypercholesterolemia, unspecified: Secondary | ICD-10-CM

## 2019-01-29 DIAGNOSIS — E785 Hyperlipidemia, unspecified: Secondary | ICD-10-CM

## 2019-01-29 DIAGNOSIS — Z131 Encounter for screening for diabetes mellitus: Secondary | ICD-10-CM

## 2019-01-29 DIAGNOSIS — Z794 Long term (current) use of insulin: Secondary | ICD-10-CM

## 2019-01-29 DIAGNOSIS — E538 Deficiency of other specified B group vitamins: Secondary | ICD-10-CM

## 2019-01-29 DIAGNOSIS — F411 Generalized anxiety disorder: Secondary | ICD-10-CM

## 2019-01-29 DIAGNOSIS — I10 Essential (primary) hypertension: Secondary | ICD-10-CM

## 2019-01-29 DIAGNOSIS — E559 Vitamin D deficiency, unspecified: Secondary | ICD-10-CM

## 2019-01-29 DIAGNOSIS — I5189 Other ill-defined heart diseases: Secondary | ICD-10-CM

## 2019-01-29 DIAGNOSIS — I5022 Chronic systolic (congestive) heart failure: Secondary | ICD-10-CM

## 2019-01-29 DIAGNOSIS — Z1389 Encounter for screening for other disorder: Secondary | ICD-10-CM

## 2019-01-29 DIAGNOSIS — E1143 Type 2 diabetes mellitus with diabetic autonomic (poly)neuropathy: Secondary | ICD-10-CM

## 2019-01-29 DIAGNOSIS — E1165 Type 2 diabetes mellitus with hyperglycemia: Secondary | ICD-10-CM

## 2019-01-29 DIAGNOSIS — I700 Atherosclerosis of aorta: Secondary | ICD-10-CM

## 2019-01-29 DIAGNOSIS — E1169 Type 2 diabetes mellitus with other specified complication: Secondary | ICD-10-CM

## 2019-01-29 DIAGNOSIS — Z79899 Other long term (current) drug therapy: Secondary | ICD-10-CM | POA: Diagnosis not present

## 2019-01-29 DIAGNOSIS — I428 Other cardiomyopathies: Secondary | ICD-10-CM

## 2019-01-29 DIAGNOSIS — IMO0002 Reserved for concepts with insufficient information to code with codable children: Secondary | ICD-10-CM

## 2019-01-29 DIAGNOSIS — Z950 Presence of cardiac pacemaker: Secondary | ICD-10-CM

## 2019-01-29 DIAGNOSIS — K219 Gastro-esophageal reflux disease without esophagitis: Secondary | ICD-10-CM

## 2019-01-29 DIAGNOSIS — Z0001 Encounter for general adult medical examination with abnormal findings: Secondary | ICD-10-CM

## 2019-01-29 DIAGNOSIS — E1322 Other specified diabetes mellitus with diabetic chronic kidney disease: Secondary | ICD-10-CM

## 2019-01-29 LAB — NOVEL CORONAVIRUS, NAA: SARS-CoV-2, NAA: NOT DETECTED

## 2019-01-29 NOTE — Patient Instructions (Signed)
General eating tips  What to Avoid . Avoid added sugars o Often added sugar can be found in processed foods such as many condiments, dry cereals, cakes, cookies, chips, crisps, crackers, candies, sweetened drinks, etc.  o Read labels and AVOID/DECREASE use of foods with the following in their ingredient list: Sugar, fructose, high fructose corn syrup, sucrose, glucose, maltose, dextrose, molasses, cane sugar, brown sugar, any type of syrup, agave nectar, etc.   . Avoid snacking in between meals- drink water or if you feel you need a snack, pick a high water content snack such as cucumbers, watermelon, or any veggie.  Marland Kitchen Avoid foods made with flour o If you are going to eat food made with flour, choose those made with whole-grains; and, minimize your consumption as much as is tolerable . Avoid processed foods o These foods are generally stocked in the middle of the grocery store.  o Focus on shopping on the perimeter of the grocery.  What to Include . Vegetables o GREEN LEAFY VEGETABLES: Kale, spinach, mustard greens, collard greens, cabbage, broccoli, etc. o OTHER: Asparagus, cauliflower, eggplant, carrots, peas, Brussel sprouts, tomatoes, bell peppers, zucchini, beets, cucumbers, etc. . Grains, seeds, and legumes o Beans: kidney beans, black eyed peas, garbanzo beans, black beans, pinto beans, etc. o Whole, unrefined grains: brown rice, barley, bulgur, oatmeal, etc. . Healthy fats  o Avoid highly processed fats such as vegetable oil o Examples of healthy fats: avocado, olives, virgin olive oil, dark chocolate (?72% Cocoa), nuts (peanuts, almonds, walnuts, cashews, pecans, etc.) o Please still do small amount of these healthy fats, they are dense in calories.  . Low - Moderate Intake of Animal Sources of Protein o Meat sources: chicken, [place], salmon, tuna. Limit to 4 ounces of meat at one time or the size of your palm. o Consider limiting dairy sources, but when choosing dairy focus on:  PLAIN [place] yogurt, cottage cheese, high-protein milk . Fruit o Choose berries    Google mindful eating and here are some tips and tricks below.   Rate your hunger before you eat on a scale of 1-10, try to eat closer to a 6 or higher. And if you are at below that, why are you eating? Slow down and listen to your body.

## 2019-01-30 LAB — CBC WITH DIFFERENTIAL/PLATELET
Absolute Monocytes: 507 cells/uL (ref 200–950)
Basophils Absolute: 69 cells/uL (ref 0–200)
Basophils Relative: 0.8 %
Eosinophils Absolute: 146 cells/uL (ref 15–500)
Eosinophils Relative: 1.7 %
HCT: 40.4 % (ref 35.0–45.0)
Hemoglobin: 13.7 g/dL (ref 11.7–15.5)
Lymphs Abs: 3001 cells/uL (ref 850–3900)
MCH: 31.3 pg (ref 27.0–33.0)
MCHC: 33.9 g/dL (ref 32.0–36.0)
MCV: 92.2 fL (ref 80.0–100.0)
MPV: 10.3 fL (ref 7.5–12.5)
Monocytes Relative: 5.9 %
Neutro Abs: 4876 cells/uL (ref 1500–7800)
Neutrophils Relative %: 56.7 %
Platelets: 291 10*3/uL (ref 140–400)
RBC: 4.38 10*6/uL (ref 3.80–5.10)
RDW: 13.1 % (ref 11.0–15.0)
Total Lymphocyte: 34.9 %
WBC: 8.6 10*3/uL (ref 3.8–10.8)

## 2019-01-30 LAB — URINALYSIS, ROUTINE W REFLEX MICROSCOPIC
Bilirubin Urine: NEGATIVE
Glucose, UA: NEGATIVE
Hgb urine dipstick: NEGATIVE
Ketones, ur: NEGATIVE
Leukocytes,Ua: NEGATIVE
Nitrite: NEGATIVE
Protein, ur: NEGATIVE
Specific Gravity, Urine: 1.011 (ref 1.001–1.03)
pH: 8 (ref 5.0–8.0)

## 2019-01-30 LAB — COMPLETE METABOLIC PANEL WITH GFR
AG Ratio: 1.8 (calc) (ref 1.0–2.5)
ALT: 31 U/L — ABNORMAL HIGH (ref 6–29)
AST: 29 U/L (ref 10–35)
Albumin: 4.7 g/dL (ref 3.6–5.1)
Alkaline phosphatase (APISO): 39 U/L (ref 37–153)
BUN/Creatinine Ratio: 20 (calc) (ref 6–22)
BUN: 22 mg/dL (ref 7–25)
CO2: 26 mmol/L (ref 20–32)
Calcium: 10.5 mg/dL — ABNORMAL HIGH (ref 8.6–10.4)
Chloride: 103 mmol/L (ref 98–110)
Creat: 1.11 mg/dL — ABNORMAL HIGH (ref 0.50–0.99)
GFR, Est African American: 62 mL/min/{1.73_m2} (ref >=60)
GFR, Est Non African American: 53 mL/min/{1.73_m2} — ABNORMAL LOW (ref >=60)
Globulin: 2.6 g/dL (calc) (ref 1.9–3.7)
Glucose, Bld: 132 mg/dL — ABNORMAL HIGH (ref 65–99)
Potassium: 4.9 mmol/L (ref 3.5–5.3)
Sodium: 141 mmol/L (ref 135–146)
Total Bilirubin: 0.5 mg/dL (ref 0.2–1.2)
Total Protein: 7.3 g/dL (ref 6.1–8.1)

## 2019-01-30 LAB — HEMOGLOBIN A1C
Hgb A1c MFr Bld: 5.9 % of total Hgb — ABNORMAL HIGH (ref <5.7)
Mean Plasma Glucose: 123 (calc)
eAG (mmol/L): 6.8 (calc)

## 2019-01-30 LAB — LIPID PANEL
Cholesterol: 87 mg/dL (ref <200)
HDL: 35 mg/dL — ABNORMAL LOW (ref >=50)
LDL Cholesterol (Calc): 34 mg/dL (calc)
Non-HDL Cholesterol (Calc): 52 mg/dL (calc) (ref <130)
Total CHOL/HDL Ratio: 2.5 (calc) (ref <5.0)
Triglycerides: 94 mg/dL (ref <150)

## 2019-01-30 LAB — VITAMIN B12: Vitamin B-12: 508 pg/mL (ref 200–1100)

## 2019-01-30 LAB — MICROALBUMIN / CREATININE URINE RATIO
Creatinine, Urine: 49 mg/dL (ref 20–275)
Microalb Creat Ratio: 8 mcg/mg creat (ref <30)
Microalb, Ur: 0.4 mg/dL

## 2019-01-30 LAB — MAGNESIUM: Magnesium: 1.9 mg/dL (ref 1.5–2.5)

## 2019-01-30 LAB — VITAMIN D 25 HYDROXY (VIT D DEFICIENCY, FRACTURES): Vit D, 25-Hydroxy: 46 ng/mL (ref 30–100)

## 2019-01-30 LAB — TSH: TSH: 1.59 mIU/L (ref 0.40–4.50)

## 2019-02-26 ENCOUNTER — Other Ambulatory Visit: Payer: Self-pay | Admitting: Physician Assistant

## 2019-03-20 ENCOUNTER — Encounter: Payer: Self-pay | Admitting: Physician Assistant

## 2019-03-20 DIAGNOSIS — Z8616 Personal history of COVID-19: Secondary | ICD-10-CM | POA: Insufficient documentation

## 2019-04-03 ENCOUNTER — Ambulatory Visit (INDEPENDENT_AMBULATORY_CARE_PROVIDER_SITE_OTHER): Payer: BC Managed Care – PPO | Admitting: *Deleted

## 2019-04-03 DIAGNOSIS — I5022 Chronic systolic (congestive) heart failure: Secondary | ICD-10-CM | POA: Diagnosis not present

## 2019-04-04 LAB — CUP PACEART REMOTE DEVICE CHECK
Battery Remaining Longevity: 94 mo
Battery Remaining Percentage: 95.5 %
Battery Voltage: 2.96 V
Brady Statistic AP VP Percent: 7.1 %
Brady Statistic AP VS Percent: 1 %
Brady Statistic AS VP Percent: 93 %
Brady Statistic AS VS Percent: 1 %
Brady Statistic RA Percent Paced: 7 %
Date Time Interrogation Session: 20210126040014
Implantable Lead Implant Date: 20091016
Implantable Lead Implant Date: 20091016
Implantable Lead Implant Date: 20091016
Implantable Lead Location: 753858
Implantable Lead Location: 753859
Implantable Lead Location: 753860
Implantable Lead Model: 4196
Implantable Lead Model: 7122
Implantable Pulse Generator Implant Date: 20151111
Lead Channel Impedance Value: 380 Ohm
Lead Channel Impedance Value: 460 Ohm
Lead Channel Impedance Value: 790 Ohm
Lead Channel Pacing Threshold Amplitude: 0.5 V
Lead Channel Pacing Threshold Amplitude: 1 V
Lead Channel Pacing Threshold Amplitude: 1.25 V
Lead Channel Pacing Threshold Pulse Width: 0.3 ms
Lead Channel Pacing Threshold Pulse Width: 0.3 ms
Lead Channel Pacing Threshold Pulse Width: 0.6 ms
Lead Channel Sensing Intrinsic Amplitude: 12 mV
Lead Channel Sensing Intrinsic Amplitude: 2.9 mV
Lead Channel Setting Pacing Amplitude: 2 V
Lead Channel Setting Pacing Amplitude: 2.25 V
Lead Channel Setting Pacing Amplitude: 2.5 V
Lead Channel Setting Pacing Pulse Width: 0.3 ms
Lead Channel Setting Pacing Pulse Width: 0.6 ms
Lead Channel Setting Sensing Sensitivity: 2 mV
Pulse Gen Model: 3222
Pulse Gen Serial Number: 7688750

## 2019-05-04 ENCOUNTER — Ambulatory Visit (INDEPENDENT_AMBULATORY_CARE_PROVIDER_SITE_OTHER): Payer: BC Managed Care – PPO | Admitting: Internal Medicine

## 2019-05-04 ENCOUNTER — Other Ambulatory Visit: Payer: Self-pay

## 2019-05-04 ENCOUNTER — Encounter: Payer: Self-pay | Admitting: Internal Medicine

## 2019-05-04 VITALS — BP 114/56 | HR 70 | Ht 66.0 in | Wt 182.0 lb

## 2019-05-04 DIAGNOSIS — Z950 Presence of cardiac pacemaker: Secondary | ICD-10-CM

## 2019-05-04 DIAGNOSIS — I10 Essential (primary) hypertension: Secondary | ICD-10-CM

## 2019-05-04 DIAGNOSIS — I428 Other cardiomyopathies: Secondary | ICD-10-CM

## 2019-05-04 DIAGNOSIS — I5022 Chronic systolic (congestive) heart failure: Secondary | ICD-10-CM

## 2019-05-04 MED ORDER — CARVEDILOL 12.5 MG PO TABS: 12.5000 mg | ORAL_TABLET | Freq: Two times a day (BID) | ORAL | 3 refills | Status: DC

## 2019-05-04 NOTE — Patient Instructions (Addendum)
Medication Instructions:  Your physician has recommended you make the following change in your medication:   1.  Reduce your carvedilol 25 mg--Take 1/2 tablet (12.5 mg) by mouth twice a day   Labwork: None ordered.  Testing/Procedures: None ordered.  Follow-Up: Your physician wants you to follow-up in: one year with Dr. Ladona Ridgel.   You will receive a reminder letter in the mail two months in advance. If you don't receive a letter, please call our office to schedule the follow-up appointment.  Remote monitoring is used to monitor your Pacemaker from home. This monitoring reduces the number of office visits required to check your device to one time per year. It allows [place] to keep an eye on the functioning of your device to ensure it is working properly. You are scheduled for a device check from home on 07/03/2019. You may send your transmission at any time that day. If you have a wireless device, the transmission will be sent automatically. After your physician reviews your transmission, you will receive a postcard with your next transmission date.  Any Other Special Instructions Will Be Listed Below (If Applicable).  If you need a refill on your cardiac medications before your next appointment, please call your pharmacy.

## 2019-05-04 NOTE — Progress Notes (Signed)
HPI Mrs. Vanlue returns today for followup. She is a very pleasant64 year old woman with a nonischemic cardiomyopathy, chronic systolic heart failure, and left bundle branch block, who underwent biventricular ICD implantation and had normalization of her left ventricular function, s/p downgrade to a BiV PPM. In the interim she has beenwell with no chest pain or sob. She had lost 45 lbs in the last year. In the interim she has had improvement in her palpitations.  The patient has not had edema. With her over 80 lbs of weight loss, she c/o folds of loose skin.  Allergies  Allergen Reactions  . Flagyl [Metronidazole] Anaphylaxis  . Niacin Other (See Comments)  . Niacin And Related Other (See Comments) and Cough    flushing  . Penicillins Swelling and Rash    Has patient had a PCN reaction causing immediate rash, facial/tongue/throat swelling, SOB or lightheadedness with hypotension: No Has patient had a PCN reaction causing severe rash involving mucus membranes or skin necrosis: No Has patient had a PCN reaction that required hospitalization No Has patient had a PCN reaction occurring within the last 10 years: Yes 3-4 years ago If all of the above answers are "NO", then may proceed with Cephalosporin use.  Marland Kitchen Yeast-Related Products Swelling and Rash     Current Outpatient Medications  Medication Sig Dispense Refill  . aspirin 81 MG tablet Take 162 mg by mouth at bedtime.     Marland Kitchen aspirin-acetaminophen-caffeine (EXCEDRIN MIGRAINE) 250-250-65 MG per tablet Take 2 tablets by mouth daily as needed for migraine.    . carvedilol (COREG) 25 MG tablet Take 1 tablet (25 mg total) by mouth 2 (two) times daily with a meal. 180 tablet 3  . diazepam (VALIUM) 5 MG tablet Take 1/2 to 1 tablet at Bedtime ONLY if needed 30 tablet 0  . estradiol (ESTRACE) 1 MG tablet Take 1.5 tablets by mouth daily.  4  . fenofibrate micronized (LOFIBRA) 134 MG capsule Take 1 capsule (134 mg total) by mouth daily  before breakfast. 90 capsule 3  . imipramine (TOFRANIL) 25 MG tablet Take 1 to 2 tablets daily as directed 180 tablet 1  . loratadine (CLARITIN) 10 MG tablet Take 10 mg by mouth daily.     . metFORMIN (GLUMETZA) 500 MG (MOD) 24 hr tablet Take 1,000 mg by mouth 2 (two) times daily with a meal.     . Potassium 99 MG TABS Take 0.5 tablets (49.5 mg total) by mouth daily. 330 tablet   . rizatriptan (MAXALT) 10 MG tablet Take 1 tablet (10 mg total) by mouth as needed for migraine. May repeat in 2 hours if needed 10 tablet 0  . rosuvastatin (CRESTOR) 40 MG tablet TAKE 1 TABLET(40 MG) BY MOUTH DAILY. 90 tablet 3  . spironolactone (ALDACTONE) 25 MG tablet Take 1 tablet (25 mg total) by mouth 2 (two) times daily. 180 tablet 3  . valACYclovir (VALTREX) 500 MG tablet TAKE 1 TABLET(500 MG) BY MOUTH DAILY 90 tablet 0  . verapamil (CALAN-SR) 240 MG CR tablet TAKE 1 TABLET(240 MG) BY MOUTH AT BEDTIME 90 tablet 3  . losartan (COZAAR) 25 MG tablet Take 1 tablet (25 mg total) by mouth daily. 90 tablet 3   No current facility-administered medications for this visit.     Past Medical History:  Diagnosis Date  . Automatic implantable cardiac defibrillator in situ    ST. JUDE MODEL 7122  . CHF (congestive heart failure) (HCC)   . Diabetes mellitus   .  Hyperlipidemia   . Hypertension 06/25/2011   Renal dopplers - superior mesenteric artery >50% diameter reduction; R renal artery - normal patency; L proximal renal artery 1-59% reduction (lower end of scale); both kidneys normal in size/symmetry with normal cortex and medulla  . Leg pain 12/09/2010   doppler of R femoral artery - no evidence of dissection, AV fistula, pseudoaneurysm or other vascular abnormalities  . Migraines   . Obesity   . Sleep apnea    on CPAP - AHI during sleep study was 44  . Type II or unspecified type diabetes mellitus without mention of complication, not stated as uncontrolled   . Type II or unspecified type diabetes mellitus without  mention of complication, not stated as uncontrolled   . Unspecified sleep apnea     ROS:   All systems reviewed and negative except as noted in the HPI.   Past Surgical History:  Procedure Laterality Date  . ABDOMINAL HYSTERECTOMY    . BIV PACEMAKER GENERATOR CHANGE OUT N/A 01/16/2014   Procedure: BIV PACEMAKER GENERATOR CHANGE OUT;  Surgeon: Evans Lance, MD;  Location: [facility] CATH LAB;  Service: Cardiovascular;  Laterality: N/A;  . CARDIAC CATHETERIZATION  07/15/2006   no significant CAD by cardiac cath, confirmed by intravascular [place] of LAD; non-ischemic cardiomyopathy prob related to uncontrolled hypertension, DM and morbid obesity; moderate pulmonary hypertension; preserved cardiac output and cardiac index  . CARDIAC DEFIBRILLATOR PLACEMENT     ICD by Dr. Lovena Le  . CARDIOVASCULAR STRESS TEST  08/28/2010   R/P MV - pattern of normal perfusion in all regions, no scintigraphic evidence of inducible myocardial ischemia; no EKG changes; normal perfusion study; pt did experience chesst pain during strudy, resolved spontaneously  . DOPPLER ECHOCARDIOGRAPHY  06/25/2011   EF >55%; moderate concentric LV hypertrophy; LA mildly dilated, mild mitral annular calcification;   . KNEE SURGERY    . NECK SURGERY    . PACEMAKER INSERTION       Family History  Problem Relation Age of Onset  . Heart attack Father   . Hearing loss Father   . Hypertension Father   . Cancer Mother   . Cancer Maternal Aunt        colon  . Cancer Maternal Uncle        colon  . Cancer Maternal Grandfather        colon cancer     Social History   Socioeconomic History  . Marital status: Married    Spouse name: Not on file  . Number of children: Not on file  . Years of education: Not on file  . Highest education level: Not on file  Occupational History  . Not on file  Tobacco Use  . Smoking status: Former Smoker    Types: Cigarettes    Quit date: 03/09/1975    Years since quitting: 44.1  . Smokeless tobacco:  Never Used  Substance and Sexual Activity  . Alcohol use: Yes    Comment: Once a month.   . Drug use: No  . Sexual activity: Not on file  Other Topics Concern  . 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 Difficulty of Paying Living Expenses: Not on file  Food Insecurity:   . Worried About Charity fundraiser in the Last Year: Not on file  . Ran Out of Food in the Last Year: Not on file  Transportation Needs:   . Freight forwarder (Medical): Not on file  . Lack of Transportation (Non-Medical): Not on file  Physical Activity:   . Days of Exercise per Week: Not on file  . Minutes of Exercise per Session: Not on file  Stress:   . Feeling of Stress : Not on file  Social Connections:   . Frequency of Communication with Friends and Family: Not on file  . Frequency of Social Gatherings with Friends and Family: Not on file  . Attends Religious Services: Not on file  . Active Member of Clubs or Organizations: Not on file  . Attends Banker Meetings: Not on file  . Marital Status: Not on file  Intimate Partner Violence:   . Fear of Current or Ex-Partner: Not on file  . Emotionally Abused: Not on file  . Physically Abused: Not on file  . Sexually Abused: Not on file     BP (!) 114/56   Pulse 70   Ht 5\' 6"  (1.676 m)   Wt 182 lb (82.6 kg)   SpO2 98%   BMI 29.38 kg/m   Physical Exam:  Well appearing 64 yo woman, NAD HEENT: Unremarkable Neck:  6 cm JVD, no thyromegally Lymphatics:  No adenopathy Back:  No CVA tenderness Lungs:  Clear with no wheezes HEART:  Regular rate rhythm, no murmurs, no rubs, no clicks Abd:  soft, positive bowel sounds, no organomegally, no rebound, no guarding Ext:  2 plus pulses, no edema, no cyanosis, no clubbing Skin:  No rashes no nodules Neuro:  CN II through XII intact, motor grossly intact  DEVICE  Normal device  function.  See PaceArt for details.   Assess/Plan: 1. Chronic systolic heart failure - she has reduced her dose of meds as she has lost weight and her bp has gone down. She will reduce her coreg to 12.5 bid. 2. Obesity - she has lost about 80 lbs in the past 2 years. She is encouraged to continue. 3. Palpitations - these are much improved.  4. Biv PPM - her St. Jude Biv PPM is working normally. We will recheck in several months.  65.D.

## 2019-05-07 ENCOUNTER — Other Ambulatory Visit: Payer: Self-pay | Admitting: Physician Assistant

## 2019-07-01 ENCOUNTER — Other Ambulatory Visit: Payer: Self-pay | Admitting: Physician Assistant

## 2019-07-03 ENCOUNTER — Ambulatory Visit (INDEPENDENT_AMBULATORY_CARE_PROVIDER_SITE_OTHER): Payer: BC Managed Care – PPO | Admitting: *Deleted

## 2019-07-03 DIAGNOSIS — I5022 Chronic systolic (congestive) heart failure: Secondary | ICD-10-CM | POA: Diagnosis not present

## 2019-07-03 LAB — CUP PACEART REMOTE DEVICE CHECK
Battery Remaining Longevity: 86 mo
Battery Remaining Percentage: 89 %
Battery Voltage: 2.95 V
Brady Statistic AP VP Percent: 14 %
Brady Statistic AP VS Percent: 1 %
Brady Statistic AS VP Percent: 86 %
Brady Statistic AS VS Percent: 1 %
Brady Statistic RA Percent Paced: 14 %
Date Time Interrogation Session: 20210426190519
Implantable Lead Implant Date: 20091016
Implantable Lead Implant Date: 20091016
Implantable Lead Implant Date: 20091016
Implantable Lead Location: 753858
Implantable Lead Location: 753859
Implantable Lead Location: 753860
Implantable Lead Model: 4196
Implantable Lead Model: 7122
Implantable Pulse Generator Implant Date: 20151111
Lead Channel Impedance Value: 380 Ohm
Lead Channel Impedance Value: 450 Ohm
Lead Channel Impedance Value: 780 Ohm
Lead Channel Pacing Threshold Amplitude: 0.75 V
Lead Channel Pacing Threshold Amplitude: 1 V
Lead Channel Pacing Threshold Amplitude: 1.5 V
Lead Channel Pacing Threshold Pulse Width: 0.3 ms
Lead Channel Pacing Threshold Pulse Width: 0.3 ms
Lead Channel Pacing Threshold Pulse Width: 0.6 ms
Lead Channel Sensing Intrinsic Amplitude: 3.1 mV
Lead Channel Sensing Intrinsic Amplitude: 9.4 mV
Lead Channel Setting Pacing Amplitude: 2 V
Lead Channel Setting Pacing Amplitude: 2.5 V
Lead Channel Setting Pacing Amplitude: 2.5 V
Lead Channel Setting Pacing Pulse Width: 0.3 ms
Lead Channel Setting Pacing Pulse Width: 0.6 ms
Lead Channel Setting Sensing Sensitivity: 2 mV
Pulse Gen Model: 3222
Pulse Gen Serial Number: 7688750

## 2019-07-04 NOTE — Progress Notes (Signed)
PPM Remote  

## 2019-07-26 NOTE — Progress Notes (Signed)
6 month follow up  Assessment and Plan:  Dizziness Orthostatics- no CP, SOB On days she walks try to add in more water Cut back on spirolactone to once a day and follow up cardiology Check labs -     COMPLETE METABOLIC PANEL WITH GFR -     TSH -     Iron,Total/Total Iron Binding Cap -     Vitamin B12 -     spironolactone (ALDACTONE) 25 MG tablet; Take 1 tablet (25 mg total) by mouth once for 1 dose.  Essential hypertension - continue medications, DASH diet, exercise and monitor at home. Call if greater than 130/80.  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 Talmage Nap  Diabetic autonomic neuropathy associated with type 2 diabetes mellitus (HCC) -     Hemoglobin A1c -- continue weight loss - discussed low sugars and awareness, will schedule follow up Dr. Talmage Nap  Uncontrolled type 2 diabetes mellitus with hyperglycemia, with long-term current use of insulin (HCC) -     Hemoglobin A1c - continue weight loss - discussed low sugars and awareness, will schedule follow up Dr. Talmage Nap  HYPERCHOLESTEROLEMIA -     Lipid panel -continue medications, check lipids, decrease fatty foods, increase activity.   Morbid Obesity with co morbidities - long discussion about weight loss, diet, and exercise  Diastolic dysfunction Continue weight loss  Medication management -     Magnesium  Vitamin D deficiency -     VITAMIN D 25  Hydroxy (Vit-D Deficiency, Fractures)  Aortic atherosclerosis (HCC) Control blood pressure, cholesterol, glucose, increase exercise.   B12 deficiency -     Vitamin B12  Iron deficiency -     Iron,Total/Total Iron Binding Cap -     Ferritin Discussed med's effects and SE's. Screening labs and tests as requested with regular follow-up as recommended. Future Appointments  Date Time Provider Department Center  10/02/2019  9:10 AM [facility] DEVICE REMOTES [facility]  01/01/2020  9:10 AM [facility] DEVICE REMOTES [facility]  01/29/2020  3:00 PM Quentin Mulling, PA-C [facility] None    HPI 64 y.o. female  presents for follow up for DM, chol, HTN, NIDCMP  Her blood pressure has been controlled at home, today their BP is BP: 118/62 She does workout, does fitness walk program inside.   She denies chest pain, shortness of breath, dizziness.  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 Tresa Endo.  Last echo was 02/2018 with EF 50-55%  She is on BB, ARB, spirolactone, and off torsemide- recenlty decreased due to hypotension from intentional weight loss she has started to have dizziness with standing x 1-2 week. She is drinking plenty of fluids, no diarrhea more constipation. No dizziness/SOB/CP with 3-4 miles of walking, will sweat a lot, will not add in electrolytes.  Marland Kitchen  BMI is Body mass  index is 27.6 kg/m., she is working on diet and exercise. She has OSA and is on CPAP.  She is still on phentermine but takes 1/2 tablet a few days a week, mainly on her "difficult days". Wt Readings from Last 3 Encounters:  07/30/19 171 lb (77.6 kg)  05/04/19 182 lb (82.6 kg)  01/29/19 186 lb 3.2 oz (84.5 kg)   She has been working on diet and exercise for Diabetes, she is off insulin due to weight loss With CKD she is on ACE/ARB on cozaar 25mg .  With hyperlipidemia is on crestor 40 and lopid, at goal less than  70.  With neuropathy With CAD she is on bASA  She follows up with Dr. for her DM She is off insulin at this time, just on metformin at this time  She see's Dr. Talmage Nap for eye exams 11/2018 normal Meter: freestyle libre denies hypoglycemia , polydipsia, polyuria and visual disturbances.  Does have paresthesias in bilateral feet/legs.  Last A1C was below 7 with Dr. 12/2018 in the spring.   Last A1C was:  Lab Results  Component Value Date   HGBA1C 5.9 (H) 01/29/2019   Lab Results  Component Value Date   GFRNONAA 53 (L) 01/29/2019   Lab Results  Component Value Date   CHOL 87 01/29/2019   HDL 35 (L) 01/29/2019   LDLCALC 34 01/29/2019   TRIG 94 01/29/2019   CHOLHDL 2.5 01/29/2019   Patient is on Vitamin D supplement.  Lab Results  Component Value Date   VD25OH 46 01/29/2019  Has history of migraines.  She follows with Dr. 01/31/2019, has had several seb cyst around breast.   Lab Results  Component Value Date   VITAMINB12 508 01/29/2019    Current Medications:   Current Outpatient Medications (Endocrine & Metabolic):  .  estradiol (ESTRACE) 1 MG tablet, Take 1.5 tablets by mouth daily. .  metFORMIN (GLUMETZA) 500 MG (MOD) 24 hr tablet, Take 1,000 mg by mouth 2 (two) times daily with a meal.   Current Outpatient Medications (Cardiovascular):  .  carvedilol (COREG) 12.5 MG tablet, Take 1 tablet (12.5 mg total) by mouth 2 (two) times daily. .  fenofibrate micronized (LOFIBRA) 134 MG capsule, Take 1 capsule (134 mg total) by mouth daily before breakfast. .  rosuvastatin (CRESTOR) 40 MG tablet, TAKE 1 TABLET(40 MG) BY MOUTH DAILY. 01/31/2019  spironolactone (ALDACTONE) 25 MG tablet, Take 1 tablet (25 mg total) by mouth 2 (two) times daily. .  verapamil (CALAN-SR) 240 MG CR tablet, TAKE 1 TABLET(240 MG) BY MOUTH AT BEDTIME .  losartan (COZAAR) 25 MG tablet, Take 1 tablet (25 mg total) by mouth daily.  Current Outpatient Medications (Respiratory):  .  loratadine (CLARITIN) 10 MG tablet,  Take 10 mg by mouth daily.   Current Outpatient Medications (Analgesics):  .  aspirin 81 MG tablet, Take 162 mg by mouth at bedtime.  Marland Kitchen  aspirin-acetaminophen-caffeine (EXCEDRIN MIGRAINE) 250-250-65 MG per tablet, Take 2 tablets by mouth daily as needed for migraine. .  rizatriptan (MAXALT) 10 MG tablet, Take 1 tablet (10 mg total) by mouth as needed for migraine. May repeat in 2 hours if needed   Current Outpatient Medications (Other):  .  diazepam (VALIUM) 5 MG tablet, Take 1/2 to 1 tablet at Bedtime ONLY if needed .  imipramine (TOFRANIL) 25 MG tablet, TAKE 1 TO 2 TABLETS BY MOUTH DAILY AS DIRECTED .  phentermine (ADIPEX-P) 37.5 MG tablet, TAKE 1 TABLET(37.5 MG) BY MOUTH DAILY BEFORE BREAKFAST .  Potassium 99 MG TABS, Take 0.5 tablets (49.5 mg total) by mouth daily. .  valACYclovir (VALTREX) 500 MG tablet, TAKE 1 TABLET(500 MG) BY MOUTH DAILY  Medical History:  Past Medical History:  Diagnosis Date  . Automatic implantable cardiac defibrillator in situ    [employer] MODEL 7122  . CHF (congestive heart failure) (HCC)   . Diabetes mellitus   . Hyperlipidemia   . Hypertension 06/25/2011   Renal dopplers - superior mesenteric artery >50% diameter reduction; R renal artery - normal patency; L proximal renal artery 1-59% reduction (lower end of scale); both kidneys normal in size/symmetry with normal cortex and medulla  . Leg pain 12/09/2010   doppler of R femoral artery - no evidence of dissection, AV fistula, pseudoaneurysm or other vascular abnormalities  . Migraines   . Obesity   . Sleep apnea    on CPAP - AHI during sleep study was 44  . Type II or unspecified type diabetes mellitus without mention of complication, not stated as uncontrolled   . Type II or unspecified type diabetes mellitus without mention of complication, not stated as uncontrolled   . Unspecified sleep apnea     Allergies  Allergen Reactions  . Flagyl [Metronidazole] Anaphylaxis  . Niacin Other (See Comments)   . Niacin And Related Other (See Comments) and Cough    flushing  . Penicillins Swelling and Rash    Has patient had a PCN reaction causing immediate rash, facial/tongue/throat swelling, SOB or lightheadedness with hypotension: No Has patient had a PCN reaction causing severe rash involving mucus membranes or skin necrosis: No Has patient had a PCN reaction that required hospitalization No Has patient had a PCN reaction occurring within the last 10 years: Yes 3-4 years ago If all of the above answers are "NO", then may proceed with Cephalosporin use.  . Yeast-Related Products Swelling and Rash     SURGICAL HISTORY She  has a past surgical history that includes Abdominal hysterectomy; Neck surgery; Pacemaker insertion; Cardiac defibrillator placement; Knee surgery; doppler echocardiography (06/25/2011); Cardiovascular stress test (08/28/2010); Cardiac catheterization (07/15/2006); and Biv pacemaker generator change out (N/A, 01/16/2014). FAMILY HISTORY Her family history includes Cancer in her maternal aunt, maternal grandfather, maternal uncle, and mother; Hearing loss in her father; Heart attack in her father; Hypertension in her father. SOCIAL HISTORY She  reports that she quit smoking about 44 years ago. Her smoking use included cigarettes. She has never used smokeless tobacco. She reports current alcohol use. She reports that she does not use drugs. Has not had alcohol in 8 months.   Review of Systems  Constitutional: Negative.   HENT: Negative.   Respiratory: Negative for cough, hemoptysis, sputum production, shortness of breath and wheezing.   Cardiovascular: Negative.   Gastrointestinal: Negative for abdominal pain, blood in stool, constipation, diarrhea, heartburn, melena, nausea and vomiting.  Genitourinary: Negative.   Musculoskeletal: Negative for back pain, falls, joint pain, myalgias and neck pain.  Skin: Negative for itching and rash.  Neurological: Negative for dizziness,  tingling, tremors, sensory change, speech change, focal weakness, seizures and loss of consciousness.  Psychiatric/Behavioral: Negative.     Physical Exam: Estimated body mass index is 27.6 kg/m as calculated from the following:   Height as of 05/04/19: 5\' 6"  (1.676 m).   Weight as of this encounter: 171 lb (77.6 kg). BP 118/62   Pulse 72   Temp (!) 97 F (36.1 C)   Wt 171 lb (77.6 kg)   SpO2 99%  BMI 27.60 kg/m  General Appearance: Well nourished, in no apparent distress. Eyes: PERRLA, EOMs, conjunctiva no swelling or erythema, normal fundi and vessels. Sinuses: No Frontal/maxillary tenderness ENT/Mouth: Ext aud canals clear, normal light reflex with TMs without erythema, bulging.  Good dentition. No erythema, swelling, or exudate on post pharynx. Tonsils not swollen or erythematous. Hearing normal.  Neck: Supple, thyroid normal. No bruits Respiratory: Respiratory effort normal, BS equal bilaterally without rales, rhonchi, wheezing or stridor. Cardio: RRR without murmurs, rubs or gallops. Brisk peripheral pulses without edema, with bilateral hard palpable cords without erythema, warmth, tenderness.  Chest: symmetric, with normal excursions and percussion. Breasts: defer Abdomen: obese, soft, nontender without organomegaly.   Lymphatics: Non tender without lymphadenopathy.  Genitourinary: defer Musculoskeletal: Full ROM all peripheral extremities,5/5 strength, and normal gait. Skin: Warm, dry without rashes, lesions, ecchymosis.  Neuro: Cranial nerves intact, reflexes equal bilaterally. Normal muscle tone, no cerebellar symptoms. Sensation decreased bilateral feet to 2/3 up shin.  Psych: Awake and oriented X 3, normal affect, Insight and Judgment appropriate.   Vicie Mutters 8:56 AM

## 2019-07-30 ENCOUNTER — Ambulatory Visit (INDEPENDENT_AMBULATORY_CARE_PROVIDER_SITE_OTHER): Payer: BC Managed Care – PPO | Admitting: Physician Assistant

## 2019-07-30 ENCOUNTER — Encounter: Payer: Self-pay | Admitting: Physician Assistant

## 2019-07-30 ENCOUNTER — Other Ambulatory Visit: Payer: Self-pay

## 2019-07-30 VITALS — BP 118/62 | HR 72 | Temp 97.0°F | Wt 171.0 lb

## 2019-07-30 DIAGNOSIS — Z79899 Other long term (current) drug therapy: Secondary | ICD-10-CM

## 2019-07-30 DIAGNOSIS — E611 Iron deficiency: Secondary | ICD-10-CM

## 2019-07-30 DIAGNOSIS — I10 Essential (primary) hypertension: Secondary | ICD-10-CM

## 2019-07-30 DIAGNOSIS — E559 Vitamin D deficiency, unspecified: Secondary | ICD-10-CM | POA: Diagnosis not present

## 2019-07-30 DIAGNOSIS — E538 Deficiency of other specified B group vitamins: Secondary | ICD-10-CM | POA: Diagnosis not present

## 2019-07-30 DIAGNOSIS — E1169 Type 2 diabetes mellitus with other specified complication: Secondary | ICD-10-CM

## 2019-07-30 DIAGNOSIS — E785 Hyperlipidemia, unspecified: Secondary | ICD-10-CM

## 2019-07-30 DIAGNOSIS — I700 Atherosclerosis of aorta: Secondary | ICD-10-CM

## 2019-07-30 DIAGNOSIS — I5022 Chronic systolic (congestive) heart failure: Secondary | ICD-10-CM | POA: Diagnosis not present

## 2019-07-30 DIAGNOSIS — R42 Dizziness and giddiness: Secondary | ICD-10-CM

## 2019-07-30 DIAGNOSIS — I428 Other cardiomyopathies: Secondary | ICD-10-CM | POA: Diagnosis not present

## 2019-07-30 MED ORDER — SPIRONOLACTONE 25 MG PO TABS: 25.0000 mg | ORAL_TABLET | Freq: Once | ORAL | 0 refills | Status: DC

## 2019-07-30 NOTE — Patient Instructions (Addendum)
Cut back on your spirolactone to 1 pill daily, follow up with cardiology  Make sure you are drinking plenty of water.   Stop the phentermine for now since it has been 6 months.    Orthostatic Hypotension Blood pressure is a measurement of how strongly, or weakly, your blood is pressing against the walls of your arteries. Orthostatic hypotension is a sudden drop in blood pressure that happens when you quickly change positions, such as when you get up from sitting or lying down. Arteries are blood vessels that carry blood from your heart throughout your body. When blood pressure is too low, you may not get enough blood to your brain or to the rest of your organs. This can cause weakness, light-headedness, rapid heartbeat, and fainting. This can last for just a few seconds or for up to a few minutes. Orthostatic hypotension is usually not a serious problem. However, if it happens frequently or gets worse, it may be a sign of something more serious. What are the causes? This condition may be caused by:  Sudden changes in posture, such as standing up quickly after you have been sitting or lying down.  Blood loss.  Loss of body fluids (dehydration).  Heart problems.  Hormone (endocrine) problems.  Pregnancy.  Severe infection.  Lack of certain nutrients.  Severe allergic reactions (anaphylaxis).  Certain medicines, such as blood pressure medicine or medicines that make the body lose excess fluids (diuretics). Sometimes, this condition can be caused by not taking medicine as directed, such as taking too much of a certain medicine. What increases the risk? The following factors may make you more likely to develop this condition:  Age. Risk increases as you get older.  Conditions that affect the heart or the central nervous system.  Taking certain medicines, such as blood pressure medicine or diuretics.  Being pregnant. What are the signs or symptoms? Symptoms of this condition may  include:  Weakness.  Light-headedness.  Dizziness.  Blurred vision.  Fatigue.  Rapid heartbeat.  Fainting, in severe cases. How is this diagnosed? This condition is diagnosed based on:  Your medical history.  Your symptoms.  Your blood pressure measurement. Your health care provider will check your blood pressure when you are: ? Lying down. ? Sitting. ? Standing. A blood pressure reading is recorded as two numbers, such as "120 over 80" (or 120/80). The first ("top") number is called the systolic pressure. It is a measure of the pressure in your arteries as your heart beats. The second ("bottom") number is called the diastolic pressure. It is a measure of the pressure in your arteries when your heart relaxes between beats. Blood pressure is measured in a unit called mm Hg. Healthy blood pressure for most adults is 120/80. If your blood pressure is below 90/60, you may be diagnosed with hypotension. Other information or tests that may be used to diagnose orthostatic hypotension include:  Your other vital signs, such as your heart rate and temperature.  Blood tests.  Tilt table test. For this test, you will be safely secured to a table that moves you from a lying position to an upright position. Your heart rhythm and blood pressure will be monitored during the test. How is this treated? This condition may be treated by:  Changing your diet. This may involve eating more salt (sodium) or drinking more water.  Taking medicines to raise your blood pressure.  Changing the dosage of certain medicines you are taking that might be lowering your  blood pressure.  Wearing compression stockings. These stockings help to prevent blood clots and reduce swelling in your legs. In some cases, you may need to go to the hospital for:  Fluid replacement. This means you will receive fluids through an IV.  Blood replacement. This means you will receive donated blood through an IV  (transfusion).  Treating an infection or heart problems, if this applies.  Monitoring. You may need to be monitored while medicines that you are taking wear off. Follow these instructions at home: Eating and drinking   Drink enough fluid to keep your urine pale yellow.  Eat a healthy diet, and follow instructions from your health care provider about eating or drinking restrictions. A healthy diet includes: ? Fresh fruits and vegetables. ? Whole grains. ? Lean meats. ? Low-fat dairy products.  Eat extra salt only as directed. Do not add extra salt to your diet unless your health care provider told you to do that.  Eat frequent, small meals.  Avoid standing up suddenly after eating. Medicines  Take over-the-counter and prescription medicines only as told by your health care provider. ? Follow instructions from your health care provider about changing the dosage of your current medicines, if this applies. ? Do not stop or adjust any of your medicines on your own. General instructions   Wear compression stockings as told by your health care provider.  Get up slowly from lying down or sitting positions. This gives your blood pressure a chance to adjust.  Avoid hot showers and excessive heat as directed by your health care provider.  Return to your normal activities as told by your health care provider. Ask your health care provider what activities are safe for you.  Do not use any products that contain nicotine or tobacco, such as cigarettes, e-cigarettes, and chewing tobacco. If you need help quitting, ask your health care provider.  Keep all follow-up visits as told by your health care provider. This is important. Contact a health care provider if you:  Vomit.  Have diarrhea.  Have a fever for more than 2-3 days.  Feel more thirsty than usual.  Feel weak and tired. Get help right away if you:  Have chest pain.  Have a fast or irregular heartbeat.  Develop  numbness in any part of your body.  Cannot move your arms or your legs.  Have trouble speaking.  Become sweaty or feel light-headed.  Faint.  Feel short of breath.  Have trouble staying awake.  Feel confused. Summary  Orthostatic hypotension is a sudden drop in blood pressure that happens when you quickly change positions.  Orthostatic hypotension is usually not a serious problem.  It is diagnosed by having your blood pressure taken lying down, sitting, and then standing.  It may be treated by changing your diet or adjusting your medicines. This information is not intended to replace advice given to you by your health care provider. Make sure you discuss any questions you have with your health care provider. Document Revised: 08/18/2017 Document Reviewed: 08/18/2017 Elsevier Patient Education  2020 [employer].

## 2019-07-31 LAB — VITAMIN D 25 HYDROXY (VIT D DEFICIENCY, FRACTURES): Vit D, 25-Hydroxy: 49 ng/mL (ref 30–100)

## 2019-07-31 LAB — CBC WITH DIFFERENTIAL/PLATELET
Absolute Monocytes: 408 cells/uL (ref 200–950)
Basophils Absolute: 30 cells/uL (ref 0–200)
Basophils Relative: 0.5 %
Eosinophils Absolute: 90 cells/uL (ref 15–500)
Eosinophils Relative: 1.5 %
HCT: 39.3 % (ref 35.0–45.0)
Hemoglobin: 13.3 g/dL (ref 11.7–15.5)
Lymphs Abs: 2274 cells/uL (ref 850–3900)
MCH: 32 pg (ref 27.0–33.0)
MCHC: 33.8 g/dL (ref 32.0–36.0)
MCV: 94.5 fL (ref 80.0–100.0)
MPV: 9.7 fL (ref 7.5–12.5)
Monocytes Relative: 6.8 %
Neutro Abs: 3198 cells/uL (ref 1500–7800)
Neutrophils Relative %: 53.3 %
Platelets: 278 10*3/uL (ref 140–400)
RBC: 4.16 10*6/uL (ref 3.80–5.10)
RDW: 12.7 % (ref 11.0–15.0)
Total Lymphocyte: 37.9 %
WBC: 6 10*3/uL (ref 3.8–10.8)

## 2019-07-31 LAB — LIPID PANEL
Cholesterol: 92 mg/dL (ref <200)
HDL: 40 mg/dL — ABNORMAL LOW (ref >=50)
LDL Cholesterol (Calc): 39 mg/dL (calc)
Non-HDL Cholesterol (Calc): 52 mg/dL (calc) (ref <130)
Total CHOL/HDL Ratio: 2.3 (calc) (ref <5.0)
Triglycerides: 53 mg/dL (ref <150)

## 2019-07-31 LAB — MAGNESIUM: Magnesium: 1.7 mg/dL (ref 1.5–2.5)

## 2019-07-31 LAB — COMPLETE METABOLIC PANEL WITH GFR
AG Ratio: 1.8 (calc) (ref 1.0–2.5)
ALT: 18 U/L (ref 6–29)
AST: 22 U/L (ref 10–35)
Albumin: 4.4 g/dL (ref 3.6–5.1)
Alkaline phosphatase (APISO): 41 U/L (ref 37–153)
BUN: 17 mg/dL (ref 7–25)
CO2: 29 mmol/L (ref 20–32)
Calcium: 9.6 mg/dL (ref 8.6–10.4)
Chloride: 104 mmol/L (ref 98–110)
Creat: 0.99 mg/dL (ref 0.50–0.99)
GFR, Est African American: 70 mL/min/{1.73_m2} (ref >=60)
GFR, Est Non African American: 61 mL/min/{1.73_m2} (ref >=60)
Globulin: 2.5 g/dL (calc) (ref 1.9–3.7)
Glucose, Bld: 119 mg/dL — ABNORMAL HIGH (ref 65–99)
Potassium: 4.2 mmol/L (ref 3.5–5.3)
Sodium: 140 mmol/L (ref 135–146)
Total Bilirubin: 0.5 mg/dL (ref 0.2–1.2)
Total Protein: 6.9 g/dL (ref 6.1–8.1)

## 2019-07-31 LAB — IRON, TOTAL/TOTAL IRON BINDING CAP
%SAT: 19 % (calc) (ref 16–45)
Iron: 83 ug/dL (ref 45–160)
TIBC: 426 mcg/dL (calc) (ref 250–450)

## 2019-07-31 LAB — HEMOGLOBIN A1C
Hgb A1c MFr Bld: 6.2 % of total Hgb — ABNORMAL HIGH (ref <5.7)
Mean Plasma Glucose: 131 (calc)
eAG (mmol/L): 7.3 (calc)

## 2019-07-31 LAB — FERRITIN: Ferritin: 93 ng/mL (ref 16–288)

## 2019-07-31 LAB — TSH: TSH: 1.38 mIU/L (ref 0.40–4.50)

## 2019-07-31 LAB — INSULIN, RANDOM: Insulin: 6.2 u[IU]/mL

## 2019-07-31 LAB — VITAMIN B12: Vitamin B-12: 443 pg/mL (ref 200–1100)

## 2019-09-25 DIAGNOSIS — R519 Headache, unspecified: Secondary | ICD-10-CM

## 2019-09-25 MED ORDER — RIZATRIPTAN BENZOATE 10 MG PO TABS: 10.0000 mg | ORAL_TABLET | ORAL | 0 refills | Status: DC | PRN

## 2019-10-02 ENCOUNTER — Ambulatory Visit (INDEPENDENT_AMBULATORY_CARE_PROVIDER_SITE_OTHER): Payer: BC Managed Care – PPO | Admitting: *Deleted

## 2019-10-02 DIAGNOSIS — I428 Other cardiomyopathies: Secondary | ICD-10-CM | POA: Diagnosis not present

## 2019-10-03 LAB — CUP PACEART REMOTE DEVICE CHECK
Battery Remaining Longevity: 83 mo
Battery Remaining Percentage: 89 %
Battery Voltage: 2.95 V
Brady Statistic AP VP Percent: 15 %
Brady Statistic AP VS Percent: 1 %
Brady Statistic AS VP Percent: 84 %
Brady Statistic AS VS Percent: 1 %
Brady Statistic RA Percent Paced: 15 %
Date Time Interrogation Session: 20210726184327
Implantable Lead Implant Date: 20091016
Implantable Lead Implant Date: 20091016
Implantable Lead Implant Date: 20091016
Implantable Lead Location: 753858
Implantable Lead Location: 753859
Implantable Lead Location: 753860
Implantable Lead Model: 4196
Implantable Lead Model: 7122
Implantable Pulse Generator Implant Date: 20151111
Lead Channel Impedance Value: 340 Ohm
Lead Channel Impedance Value: 430 Ohm
Lead Channel Impedance Value: 710 Ohm
Lead Channel Pacing Threshold Amplitude: 0.75 V
Lead Channel Pacing Threshold Amplitude: 1 V
Lead Channel Pacing Threshold Amplitude: 1.5 V
Lead Channel Pacing Threshold Pulse Width: 0.3 ms
Lead Channel Pacing Threshold Pulse Width: 0.3 ms
Lead Channel Pacing Threshold Pulse Width: 0.6 ms
Lead Channel Sensing Intrinsic Amplitude: 2.9 mV
Lead Channel Sensing Intrinsic Amplitude: 7.7 mV
Lead Channel Setting Pacing Amplitude: 2 V
Lead Channel Setting Pacing Amplitude: 2.5 V
Lead Channel Setting Pacing Amplitude: 2.5 V
Lead Channel Setting Pacing Pulse Width: 0.3 ms
Lead Channel Setting Pacing Pulse Width: 0.6 ms
Lead Channel Setting Sensing Sensitivity: 2 mV
Pulse Gen Model: 3222
Pulse Gen Serial Number: 7688750

## 2019-10-05 NOTE — Progress Notes (Signed)
Remote pacemaker transmission.   

## 2019-11-15 ENCOUNTER — Other Ambulatory Visit: Payer: Self-pay

## 2019-11-15 DIAGNOSIS — I10 Essential (primary) hypertension: Secondary | ICD-10-CM

## 2019-11-15 MED ORDER — FENOFIBRATE 134 MG PO CAPS: 134.0000 mg | ORAL_CAPSULE | Freq: Every day | ORAL | 1 refills | Status: DC

## 2019-11-15 MED ORDER — VERAPAMIL HCL ER 240 MG PO TBCR: EXTENDED_RELEASE_TABLET | ORAL | 1 refills | Status: DC

## 2019-11-15 MED ORDER — ROSUVASTATIN CALCIUM 40 MG PO TABS: ORAL_TABLET | ORAL | 1 refills | Status: DC

## 2019-11-15 MED ORDER — LOSARTAN POTASSIUM 25 MG PO TABS: 25.0000 mg | ORAL_TABLET | Freq: Every day | ORAL | 1 refills | Status: DC

## 2019-12-12 ENCOUNTER — Other Ambulatory Visit: Payer: Self-pay

## 2019-12-12 MED ORDER — CARVEDILOL 12.5 MG PO TABS: 12.5000 mg | ORAL_TABLET | Freq: Two times a day (BID) | ORAL | 3 refills | Status: DC

## 2020-01-01 ENCOUNTER — Ambulatory Visit (INDEPENDENT_AMBULATORY_CARE_PROVIDER_SITE_OTHER): Payer: BC Managed Care – PPO

## 2020-01-01 DIAGNOSIS — I428 Other cardiomyopathies: Secondary | ICD-10-CM

## 2020-01-01 LAB — CUP PACEART REMOTE DEVICE CHECK
Battery Remaining Longevity: 74 mo
Battery Remaining Percentage: 80 %
Battery Voltage: 2.93 V
Brady Statistic AP VP Percent: 17 %
Brady Statistic AP VS Percent: 1 %
Brady Statistic AS VP Percent: 83 %
Brady Statistic AS VS Percent: 1 %
Brady Statistic RA Percent Paced: 17 %
Date Time Interrogation Session: 20211025205141
Implantable Lead Implant Date: 20091016
Implantable Lead Implant Date: 20091016
Implantable Lead Implant Date: 20091016
Implantable Lead Location: 753858
Implantable Lead Location: 753859
Implantable Lead Location: 753860
Implantable Lead Model: 4196
Implantable Lead Model: 7122
Implantable Pulse Generator Implant Date: 20151111
Lead Channel Impedance Value: 350 Ohm
Lead Channel Impedance Value: 440 Ohm
Lead Channel Impedance Value: 710 Ohm
Lead Channel Pacing Threshold Amplitude: 0.75 V
Lead Channel Pacing Threshold Amplitude: 1 V
Lead Channel Pacing Threshold Amplitude: 1.5 V
Lead Channel Pacing Threshold Pulse Width: 0.3 ms
Lead Channel Pacing Threshold Pulse Width: 0.3 ms
Lead Channel Pacing Threshold Pulse Width: 0.6 ms
Lead Channel Sensing Intrinsic Amplitude: 2.9 mV
Lead Channel Sensing Intrinsic Amplitude: 7 mV
Lead Channel Setting Pacing Amplitude: 2 V
Lead Channel Setting Pacing Amplitude: 2.5 V
Lead Channel Setting Pacing Amplitude: 2.5 V
Lead Channel Setting Pacing Pulse Width: 0.3 ms
Lead Channel Setting Pacing Pulse Width: 0.6 ms
Lead Channel Setting Sensing Sensitivity: 2 mV
Pulse Gen Model: 3222
Pulse Gen Serial Number: 7688750

## 2020-01-02 ENCOUNTER — Encounter: Payer: BLUE CROSS/BLUE SHIELD | Admitting: Physician Assistant

## 2020-01-07 NOTE — Progress Notes (Signed)
Remote pacemaker transmission.   

## 2020-01-27 ENCOUNTER — Other Ambulatory Visit: Payer: Self-pay | Admitting: Internal Medicine

## 2020-01-27 DIAGNOSIS — F411 Generalized anxiety disorder: Secondary | ICD-10-CM

## 2020-01-27 MED ORDER — IMIPRAMINE HCL 25 MG PO TABS: ORAL_TABLET | ORAL | 0 refills | Status: DC

## 2020-01-28 NOTE — Progress Notes (Signed)
Complete Physical  Assessment and Plan:  Essential hypertension - continue medications, DASH diet, exercise and monitor at home. Call if greater than 130/80.  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 Talmage Nap - discussed low sugars and awareness,   Hyperlipidemia associated with T2DM (HCC) -     Lipid panel -continue medications, LDL goal <70, check lipids, decrease fatty foods, increase activity.  - with weight loss may be able to get off of fenofibrate - pending labs   Overweight Excellent progress with 80 lb weight loss this past year - long discussion about weight loss, diet, and exercise  Other migraine without status migrainosus, not intractable Avoid triggers  Gastroesophageal reflux disease, esophagitis presence not specified Off of meds following weight loss; monitor   Diastolic dysfunction Continue weight loss  Biventricular cardiac pacemaker in situ Continue cardio follow up  Generalized anxiety disorder Continue meds  Medication management -     Magnesium  Vitamin D deficiency -     VITAMIN D 25 Hydroxy (Vit-D Deficiency, Fractures)   Orders Placed This Encounter  Procedures  . FLU VACCINE MDCK QUAD W/Preservative  . CBC with  Differential/Platelet  . COMPLETE METABOLIC PANEL WITH GFR  . Magnesium  . Lipid panel  . Hemoglobin A1c  . TSH  . VITAMIN D 25 Hydroxy (Vit-D Deficiency, Fractures)  . Microalbumin / creatinine urine ratio  . Urinalysis, Routine w reflex microscopic  . HM DIABETES FOOT EXAM    She declines q40m follow ups, agrees to schedule q6m at minimum   Discussed med's effects and SE's. Screening labs and tests as requested with regular follow-up as recommended. Future Appointments  Date Time Provider Department Center  04/01/2020  8:40 AM [facility] DEVICE REMOTES [facility]  07/01/2020  8:40 AM [facility] DEVICE REMOTES [facility]  09/30/2020  8:40 AM [facility] DEVICE REMOTES [facility]  12/30/2020  8:40 AM [facility] DEVICE REMOTES [facility]  01/28/2021  3:00 PM Judd Gaudier, NP [facility] None  03/31/2021  8:40 AM [facility] DEVICE REMOTES [facility]  06/30/2021  8:40 AM [facility] DEVICE REMOTES [facility]    HPI 64 y.o. female  presents for a complete physical. 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  She is married, 2 children, 5 grandchildren local, Still working doing retirement planning with employers, enjoys  her job. Enjoys traveling to the beach.   She has OSA and is on CPAP, lost 80 lb and hasn't been wearing in recent weeks due to discomfort  having to turn the pressure on the machine down, all sx have resolve, declines referral for retest.   Has history of migraines, infrequent, stress triggers, excedrine migraine, maxalt works well for her. Also on imipramine daily for sleep which helps.   She follows with Dr. Nicholas Lose, has had several seb cyst around breast, monitoring for now.   She has hx of TAH, follows annually with [facility], she is on estrace, down from 5 mg to 1.5 mg, has tried to reduce further.   She has hx of anxiety, prescribed valium, takes 2.5 mg occasionally, 1-2 days a year, typically around the holidays.   BMI is Body mass index is 28.22 kg/m., she has been working on diet and exercise. She is trying to get down to 160 lb on home scale, down from peak weight of 240 lb in 08/2018.  Wt Readings from Last 3 Encounters:  01/29/20 169 lb 9.6 oz (76.9 kg)  07/30/19 171 lb (77.6 kg)  05/04/19 182 lb (82.6 kg)   Her blood pressure has been controlled at home, today their BP is BP: 116/70 She does workout, does fitness walk program inside.   She denies chest pain, shortness of breath, dizziness.   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 Ladona Ridgel and Dr. Tresa Endo.  Last echo was 02/2018 with EF 50-55%. She is on BB, ARB, spirolactone, and torsemide- recenlty decreased due to hypotension from intentional weight loss.  She has been working on diet and exercise for Diabetes, she is off insulin and metofrmin this year following 80 lb weight loss With CKD she is on ACE/ARB on cozaar 25mg .  With hyperlipidemia is on crestor 40 and fenofibrate  With neuropathy With CAD she is on bASA  She follows up with Dr. Talmage Nap for her DM  She is off insulin at this time, off of metformin as well since  She see's Dr. Hazle Quant for eye exams 01/2020 normal  denies hypoglycemia , polydipsia, polyuria and visual disturbances.  Does have paresthesias in bilateral  feet/legs.   Last A1C was:  Lab Results  Component Value Date   HGBA1C 6.2 (H) 07/30/2019   Lab Results  Component Value Date   GFRNONAA 61 07/30/2019   Lab Results  Component Value Date   CHOL 92 07/30/2019   HDL 40 (L) 07/30/2019   LDLCALC 39 07/30/2019   TRIG 53 07/30/2019   CHOLHDL 2.3 07/30/2019   Patient is on Vitamin D supplement, unsure of dose, ? 2000 IU Lab Results  Component Value Date   VD25OH 49 07/30/2019      Current Medications:  Current Outpatient Medications on File Prior to Visit  Medication Sig Dispense Refill  . aspirin 81 MG tablet Take 162 mg by mouth at bedtime.     Marland Kitchen aspirin-acetaminophen-caffeine (EXCEDRIN MIGRAINE) 250-250-65 MG per tablet Take 2 tablets by mouth daily as needed for migraine.    . carvedilol (COREG) 12.5 MG tablet Take 1 tablet (12.5 mg total) by mouth 2 (two) times daily. (Patient taking differently: Take 25 mg by mouth 2 (two) times daily. ) 180 tablet 3  . diazepam (VALIUM) 5 MG tablet Take 1/2 to 1 tablet at Bedtime ONLY if needed 30 tablet 0  . estradiol (ESTRACE) 1 MG  tablet Take 1.5 tablets by mouth daily.  4  . fenofibrate micronized (LOFIBRA) 134 MG capsule Take 1 capsule (134 mg total) by mouth daily before breakfast. 60 capsule 1  . imipramine (TOFRANIL) 25 MG tablet Take        1 to 2 tablets        at Bedtime     as needed for Sleep 180 tablet 0  . loratadine (CLARITIN) 10 MG tablet Take 10 mg by mouth daily.     Marland Kitchen losartan (COZAAR) 25 MG tablet Take 1 tablet (25 mg total) by mouth daily. 60 tablet 1  . metFORMIN (GLUMETZA) 500 MG (MOD) 24 hr tablet Take 1,000 mg by mouth 2 (two) times daily with a meal.     . Potassium 99 MG TABS Take 0.5 tablets (49.5 mg total) by mouth daily. 330 tablet   . rizatriptan (MAXALT) 10 MG tablet Take 1 tablet (10 mg total) by mouth as needed for migraine. May repeat in 2 hours if needed 10 tablet 0  . rosuvastatin (CRESTOR) 40 MG tablet TAKE 1 TABLET(40 MG) BY MOUTH DAILY. 60 tablet 1  .  valACYclovir (VALTREX) 500 MG tablet TAKE 1 TABLET(500 MG) BY MOUTH DAILY 90 tablet 0  . verapamil (CALAN-SR) 240 MG CR tablet TAKE 1 TABLET(240 MG) BY MOUTH AT BEDTIME 60 tablet 1  . VITAMIN D PO Take 2,000 Units by mouth.    . spironolactone (ALDACTONE) 25 MG tablet Take 1 tablet (25 mg total) by mouth once for 1 dose. 90 tablet 0   No current facility-administered medications on file prior to visit.   Health Maintenance:   Immunization History  Administered Date(s) Administered  . Influenza Inj Mdck Quad Pf 01/02/2019  . Influenza Inj Mdck Quad With Preservative 11/22/2016, 12/22/2017  . Influenza,inj,Quad PF,6+ Mos 12/06/2017  . Influenza-Unspecified 12/06/2013, 11/22/2014  . Tdap 03/24/2012   Tetanus: 2014 Pneumovax: declines Prevnar 13: due age 27 Flu vaccine: 2020 TODAY  Zostavax: N/A Covid 19: 2/2, 2021, pfizer  - she will send information   Pap/pelvic: Hx. Of TAH, Sept 2021 Dr. Chestine Spore  Normal, getting PAP every 3 years MGM: 12/2019 has done at GYN, was normal per patient  DEXA: 2006 t -1.3 , declines at this time- will get at 66  Colonoscopy: 11/09/2016, planning 5- 8 year follow up per patient, takes miralaxb EGD: Dec 2007  Last eye: [facility], Dr. Shawna Orleans  Last dental: Dr. Fredrik Cove, goes q38m, last 2021  Last derm: [facility], last 2020  Patient Care Team: Lucky Cowboy, MD as PCP - General (Internal Medicine) Lennette Bihari, MD as PCP - Cardiology (Cardiology) Lennette Bihari, MD as Consulting Physician (Cardiology) Fletcher Anon, MD as Consulting Physician (Pediatrics) Bernette Redbird, MD as Consulting Physician (Gastroenterology) Dorisann Frames, MD as Consulting Physician (Endocrinology) Miguel Aschoff, MD (Inactive) as Consulting Physician (Obstetrics and Gynecology) Venancio Poisson, MD as Consulting Physician (Dermatology)   Medical History:  Past Medical History:  Diagnosis Date  . Automatic implantable cardiac defibrillator in situ    [employer]  7122  . CHF (congestive heart failure) (HCC)   . Diabetes mellitus   . Hyperlipidemia   . Hypertension 06/25/2011   Renal dopplers - superior mesenteric artery >50% diameter reduction; R renal artery - normal patency; L proximal renal artery 1-59% reduction (lower end of scale); both kidneys normal in size/symmetry with normal cortex and medulla  . Leg pain 12/09/2010   doppler of R femoral artery - no evidence of  dissection, AV fistula, pseudoaneurysm or other vascular abnormalities  . Migraines   . Obesity   . Sleep apnea    on CPAP - AHI during sleep study was 44  . Type II or unspecified type diabetes mellitus without mention of complication, not stated as uncontrolled   . Type II or unspecified type diabetes mellitus without mention of complication, not stated as uncontrolled   . Unspecified sleep apnea     Allergies  Allergen Reactions  . Flagyl [Metronidazole] Anaphylaxis  . Niacin Other (See Comments)  . Niacin And Related Other (See Comments) and Cough    flushing  . Penicillins Swelling and Rash    Has patient had a PCN reaction causing immediate rash, facial/tongue/throat swelling, SOB or lightheadedness with hypotension: No Has patient had a PCN reaction causing severe rash involving mucus membranes or skin necrosis: No Has patient had a PCN reaction that required hospitalization No Has patient had a PCN reaction occurring within the last 10 years: Yes 3-4 years ago If all of the above answers are "NO", then may proceed with Cephalosporin use.  . Yeast-Related Products Swelling and Rash     SURGICAL HISTORY She  has a past surgical history that includes Abdominal hysterectomy; Neck surgery; Pacemaker insertion; Cardiac defibrillator placement; Knee surgery (Right); doppler echocardiography (06/25/2011); Cardiovascular stress test (08/28/2010); Cardiac catheterization (07/15/2006); and Biv pacemaker generator change out (N/A, 01/16/2014). FAMILY HISTORY Her family history  includes Cancer - Cervical in her mother; Colon cancer in her maternal uncle; Colon cancer (age of onset: 70) in her maternal grandfather; Colon polyps in her mother; Hearing loss in her father; Heart attack in her father; Hypertension in her father. SOCIAL HISTORY She  reports that she quit smoking about 44 years ago. Her smoking use included cigarettes. She has never used smokeless tobacco. She reports previous alcohol use. She reports that she does not use drugs. Has not had alcohol in 8 months.   Review of Systems  Constitutional: Negative for malaise/fatigue and weight loss.  HENT: Negative for hearing loss and tinnitus.   Eyes: Negative for blurred vision and double vision.  Respiratory: Negative for cough, sputum production, shortness of breath and wheezing.   Cardiovascular: Negative for chest pain, palpitations, orthopnea, claudication, leg swelling and PND.  Gastrointestinal: Negative for abdominal pain, blood in stool, constipation, diarrhea, heartburn, melena, nausea and vomiting.  Genitourinary: Negative.   Musculoskeletal: Negative for falls, joint pain and myalgias.  Skin: Negative for rash.  Neurological: Negative for dizziness, tingling, sensory change, weakness and headaches.  Endo/Heme/Allergies: Negative for polydipsia.  Psychiatric/Behavioral: Negative.  Negative for depression, memory loss, substance abuse and suicidal ideas. The patient is not nervous/anxious and does not have insomnia.   All other systems reviewed and are negative.   Physical Exam: Estimated body mass index is 28.22 kg/m as calculated from the following:   Height as of this encounter:  (1.651 m).   Weight as of this encounter: 169 lb 9.6 oz (76.9 kg). BP 116/70   Pulse 72   Temp (!) 97.3 F (36.3 C)   Ht  (1.651 m)   Wt 169 lb 9.6 oz (76.9 kg)   SpO2 99%   BMI 28.22 kg/m  General Appearance: Well nourished, in no apparent distress. Eyes: PERRLA, EOMs, conjunctiva no swelling or  erythema Sinuses: No Frontal/maxillary tenderness ENT/Mouth: Ext aud canals clear, normal light reflex with TMs without erythema, bulging.  Good dentition. No erythema, swelling, or exudate on post pharynx. Tonsils not swollen  or erythematous. Hearing normal.  Neck: Supple, thyroid normal. No bruits Respiratory: Respiratory effort normal, BS equal bilaterally without rales, rhonchi, wheezing or stridor. Cardio: RRR without murmurs, rubs or gallops. Brisk peripheral pulses without edema, with bilateral hard palpable cords without erythema, warmth, tenderness.  Chest: symmetric, with normal excursions and percussion. Breasts: defer to GYN Abdomen: obese, soft, nontender without organomegaly.   Lymphatics: Non tender without lymphadenopathy.  Genitourinary: defer Musculoskeletal: Full ROM all peripheral extremities,5/5 strength, and normal gait. Skin: Warm, dry without rashes, lesions, ecchymosis.  Neuro: Cranial nerves intact, reflexes equal bilaterally. Normal muscle tone, no cerebellar symptoms. Sensation decreased bilateral feet to 2/3 up shin.  Psych: Awake and oriented X 3, normal affect, Insight and Judgment appropriate.   EKG- reports just had checked by [facility] cardiology, follows annually, defer  Dan Maker 3:30 PM

## 2020-01-29 ENCOUNTER — Ambulatory Visit (INDEPENDENT_AMBULATORY_CARE_PROVIDER_SITE_OTHER): Payer: BC Managed Care – PPO | Admitting: Adult Health

## 2020-01-29 ENCOUNTER — Other Ambulatory Visit: Payer: Self-pay

## 2020-01-29 ENCOUNTER — Encounter: Payer: Self-pay | Admitting: Adult Health

## 2020-01-29 VITALS — BP 116/70 | HR 72 | Temp 97.3°F | Ht 65.0 in | Wt 169.6 lb

## 2020-01-29 DIAGNOSIS — Z23 Encounter for immunization: Secondary | ICD-10-CM | POA: Diagnosis not present

## 2020-01-29 DIAGNOSIS — Z0001 Encounter for general adult medical examination with abnormal findings: Secondary | ICD-10-CM

## 2020-01-29 DIAGNOSIS — Z79899 Other long term (current) drug therapy: Secondary | ICD-10-CM | POA: Diagnosis not present

## 2020-01-29 DIAGNOSIS — I5022 Chronic systolic (congestive) heart failure: Secondary | ICD-10-CM

## 2020-01-29 DIAGNOSIS — Z0000 Encounter for general adult medical examination without abnormal findings: Secondary | ICD-10-CM | POA: Diagnosis not present

## 2020-01-29 DIAGNOSIS — E663 Overweight: Secondary | ICD-10-CM

## 2020-01-29 DIAGNOSIS — Z1389 Encounter for screening for other disorder: Secondary | ICD-10-CM

## 2020-01-29 DIAGNOSIS — E559 Vitamin D deficiency, unspecified: Secondary | ICD-10-CM

## 2020-01-29 DIAGNOSIS — G4733 Obstructive sleep apnea (adult) (pediatric): Secondary | ICD-10-CM

## 2020-01-29 DIAGNOSIS — F411 Generalized anxiety disorder: Secondary | ICD-10-CM

## 2020-01-29 DIAGNOSIS — IMO0002 Reserved for concepts with insufficient information to code with codable children: Secondary | ICD-10-CM

## 2020-01-29 DIAGNOSIS — Z87442 Personal history of urinary calculi: Secondary | ICD-10-CM

## 2020-01-29 DIAGNOSIS — Z1329 Encounter for screening for other suspected endocrine disorder: Secondary | ICD-10-CM | POA: Diagnosis not present

## 2020-01-29 DIAGNOSIS — E1122 Type 2 diabetes mellitus with diabetic chronic kidney disease: Secondary | ICD-10-CM | POA: Insufficient documentation

## 2020-01-29 DIAGNOSIS — E1169 Type 2 diabetes mellitus with other specified complication: Secondary | ICD-10-CM

## 2020-01-29 DIAGNOSIS — Z13220 Encounter for screening for lipoid disorders: Secondary | ICD-10-CM | POA: Diagnosis not present

## 2020-01-29 DIAGNOSIS — E1143 Type 2 diabetes mellitus with diabetic autonomic (poly)neuropathy: Secondary | ICD-10-CM

## 2020-01-29 DIAGNOSIS — Z131 Encounter for screening for diabetes mellitus: Secondary | ICD-10-CM

## 2020-01-29 DIAGNOSIS — E785 Hyperlipidemia, unspecified: Secondary | ICD-10-CM

## 2020-01-29 DIAGNOSIS — E1365 Other specified diabetes mellitus with hyperglycemia: Secondary | ICD-10-CM

## 2020-01-29 DIAGNOSIS — N182 Chronic kidney disease, stage 2 (mild): Secondary | ICD-10-CM | POA: Insufficient documentation

## 2020-01-29 DIAGNOSIS — K219 Gastro-esophageal reflux disease without esophagitis: Secondary | ICD-10-CM

## 2020-01-29 DIAGNOSIS — I10 Essential (primary) hypertension: Secondary | ICD-10-CM

## 2020-01-29 DIAGNOSIS — I700 Atherosclerosis of aorta: Secondary | ICD-10-CM

## 2020-01-29 DIAGNOSIS — Z950 Presence of cardiac pacemaker: Secondary | ICD-10-CM

## 2020-01-29 DIAGNOSIS — E119 Type 2 diabetes mellitus without complications: Secondary | ICD-10-CM

## 2020-01-29 DIAGNOSIS — G43809 Other migraine, not intractable, without status migrainosus: Secondary | ICD-10-CM

## 2020-01-29 DIAGNOSIS — I428 Other cardiomyopathies: Secondary | ICD-10-CM

## 2020-01-29 NOTE — Patient Instructions (Addendum)
[name] , Thank you for taking time to come for your Annual Wellness Visit. I appreciate your ongoing commitment to your health goals. Please review the following plan we discussed and let me know if I can assist you in the future.   These are the goals we discussed: Goals    . HEMOGLOBIN A1C < 5.7       This is a list of the screening recommended for you and due dates:  Health Maintenance  Topic Date Due  . Eye exam for diabetics  09/27/2018  . Pap Smear  11/18/2018  . Flu Shot  10/07/2019  . Mammogram  12/31/2019  . Pneumococcal vaccine  01/28/2021*  . Hemoglobin A1C  01/30/2020  . Complete foot exam   01/28/2021  . Tetanus Vaccine  03/24/2022  . Colon Cancer Screening  11/10/2026  . COVID-19 Vaccine  Completed  .  Hepatitis C: One time screening is recommended by Center for Disease Control  (CDC) for  adults born from [birth year] through 1964.   Completed  . HIV Screening  Completed  *Topic was postponed. The date shown is not the original due date.    Please let me know dates of last PAP, mammogram and covid 19 vaccine information      Aim for 7+ servings of fruits and vegetables daily  65-80+ fluid ounces of water or unsweet tea for healthy kidneys  Limit to max 1 drink of alcohol per day; avoid smoking/tobacco  Limit animal fats in diet for cholesterol and heart health - choose grass fed whenever available  Avoid highly processed foods, and foods high in saturated/trans fats  Aim for low stress - take time to unwind and care for your mental health  Aim for 150 min of moderate intensity exercise weekly for heart health, and weights twice weekly for bone health  Aim for 7-9 hours of sleep daily      High-Fiber Diet Fiber, also called dietary fiber, is a type of carbohydrate that is found in fruits, vegetables, whole grains, and beans. A high-fiber diet can have many health benefits. Your health care provider may recommend a high-fiber diet to help:  Prevent  constipation. Fiber can make your bowel movements more regular.  Lower your cholesterol.  Relieve the following conditions: ? Swelling of veins in the anus (hemorrhoids). ? Swelling and irritation (inflammation) of specific areas of the digestive tract (uncomplicated diverticulosis). ? A problem of the large intestine (colon) that sometimes causes pain and diarrhea (irritable bowel syndrome, IBS).  Prevent overeating as part of a weight-loss plan.  Prevent heart disease, type 2 diabetes, and certain cancers. What is my plan? The recommended daily fiber intake in grams (g) includes:  38 g for men age 98 or younger.  30 g for men over age 9.  25 g for women age 65 or younger.  21 g for women over age 59. You can get the recommended daily intake of dietary fiber by:  Eating a variety of fruits, vegetables, grains, and beans.  Taking a fiber supplement, if it is not possible to get enough fiber through your diet. What do I need to know about a high-fiber diet?  It is better to get fiber through food sources rather than from fiber supplements. There is not a lot of research about how effective supplements are.  Always check the fiber content on the nutrition facts label of any prepackaged food. Look for foods that contain 5 g of fiber or more per serving.  Talk with a diet and nutrition specialist (dietitian) if you have questions about specific foods that are recommended or not recommended for your medical condition, especially if those foods are not listed below.  Gradually increase how much fiber you consume. If you increase your intake of dietary fiber too quickly, you may have bloating, cramping, or gas.  Drink plenty of water. Water helps you to digest fiber. What are tips for following this plan?  Eat a wide variety of high-fiber foods.  Make sure that half of the grains that you eat each day are whole grains.  Eat breads and cereals that are made with whole-grain flour  instead of refined flour or white flour.  Eat brown rice, bulgur wheat, or millet instead of white rice.  Start the day with a breakfast that is high in fiber, such as a cereal that contains 5 g of fiber or more per serving.  Use beans in place of meat in soups, salads, and pasta dishes.  Eat high-fiber snacks, such as berries, raw vegetables, nuts, and popcorn.  Choose whole fruits and vegetables instead of processed forms like juice or sauce. What foods can I eat?  Fruits Berries. Pears. Apples. Oranges. Avocado. Prunes and raisins. Dried figs. Vegetables Sweet potatoes. Spinach. Kale. Artichokes. Cabbage. Broccoli. Cauliflower. Green peas. Carrots. Squash. Grains Whole-grain breads. Multigrain cereal. Oats and oatmeal. Brown rice. Barley. Bulgur wheat. Millet. Quinoa. Bran muffins. Popcorn. Rye wafer crackers. Meats and other proteins Navy, kidney, and pinto beans. Soybeans. Split peas. Lentils. Nuts and seeds. Dairy Fiber-fortified yogurt. Beverages Fiber-fortified soy milk. Fiber-fortified orange juice. Other foods Fiber bars. The items listed above may not be a complete list of recommended foods and beverages. Contact a dietitian for more options. What foods are not recommended? Fruits Fruit juice. Cooked, strained fruit. Vegetables Fried potatoes. Canned vegetables. Well-cooked vegetables. Grains White bread. Pasta made with refined flour. White rice. Meats and other proteins Fatty cuts of meat. Fried chicken or fried fish. Dairy Milk. Yogurt. Cream cheese. Sour cream. Fats and oils Butters. Beverages Soft drinks. Other foods Cakes and pastries. The items listed above may not be a complete list of foods and beverages to avoid. Contact a dietitian for more information. Summary  Fiber is a type of carbohydrate. It is found in fruits, vegetables, whole grains, and beans.  There are many health benefits of eating a high-fiber diet, such as preventing constipation,  lowering blood cholesterol, helping with weight loss, and reducing your risk of heart disease, diabetes, and certain cancers.  Gradually increase your intake of fiber. Increasing too fast can result in cramping, bloating, and gas. Drink plenty of water while you increase your fiber.  The best sources of fiber include whole fruits and vegetables, whole grains, nuts, seeds, and beans. This information is not intended to replace advice given to you by your health care provider. Make sure you discuss any questions you have with your health care provider. Document Revised: 12/27/2016 Document Reviewed: 12/27/2016 Elsevier Patient Education  2020 [employer].

## 2020-01-30 LAB — URINALYSIS, ROUTINE W REFLEX MICROSCOPIC
Bilirubin Urine: NEGATIVE
Glucose, UA: NEGATIVE
Hgb urine dipstick: NEGATIVE
Ketones, ur: NEGATIVE
Leukocytes,Ua: NEGATIVE
Nitrite: NEGATIVE
Protein, ur: NEGATIVE
Specific Gravity, Urine: 1.008 (ref 1.001–1.03)
pH: 7 (ref 5.0–8.0)

## 2020-01-30 LAB — COMPLETE METABOLIC PANEL WITH GFR
AG Ratio: 1.7 (calc) (ref 1.0–2.5)
ALT: 18 U/L (ref 6–29)
AST: 24 U/L (ref 10–35)
Albumin: 4.6 g/dL (ref 3.6–5.1)
Alkaline phosphatase (APISO): 48 U/L (ref 37–153)
BUN/Creatinine Ratio: 23 (calc) — ABNORMAL HIGH (ref 6–22)
BUN: 24 mg/dL (ref 7–25)
CO2: 30 mmol/L (ref 20–32)
Calcium: 10.3 mg/dL (ref 8.6–10.4)
Chloride: 104 mmol/L (ref 98–110)
Creat: 1.03 mg/dL — ABNORMAL HIGH (ref 0.50–0.99)
GFR, Est African American: 67 mL/min/{1.73_m2} (ref >=60)
GFR, Est Non African American: 58 mL/min/{1.73_m2} — ABNORMAL LOW (ref >=60)
Globulin: 2.7 g/dL (calc) (ref 1.9–3.7)
Glucose, Bld: 123 mg/dL — ABNORMAL HIGH (ref 65–99)
Potassium: 4.1 mmol/L (ref 3.5–5.3)
Sodium: 142 mmol/L (ref 135–146)
Total Bilirubin: 0.4 mg/dL (ref 0.2–1.2)
Total Protein: 7.3 g/dL (ref 6.1–8.1)

## 2020-01-30 LAB — LIPID PANEL
Cholesterol: 121 mg/dL (ref <200)
HDL: 41 mg/dL — ABNORMAL LOW (ref >=50)
LDL Cholesterol (Calc): 60 mg/dL (calc)
Non-HDL Cholesterol (Calc): 80 mg/dL (calc) (ref <130)
Total CHOL/HDL Ratio: 3 (calc) (ref <5.0)
Triglycerides: 113 mg/dL (ref <150)

## 2020-01-30 LAB — MAGNESIUM: Magnesium: 1.8 mg/dL (ref 1.5–2.5)

## 2020-01-30 LAB — CBC WITH DIFFERENTIAL/PLATELET
Absolute Monocytes: 482 cells/uL (ref 200–950)
Basophils Absolute: 79 cells/uL (ref 0–200)
Basophils Relative: 1.1 %
Eosinophils Absolute: 454 cells/uL (ref 15–500)
Eosinophils Relative: 6.3 %
HCT: 39.5 % (ref 35.0–45.0)
Hemoglobin: 13.4 g/dL (ref 11.7–15.5)
Lymphs Abs: 2642 cells/uL (ref 850–3900)
MCH: 31.6 pg (ref 27.0–33.0)
MCHC: 33.9 g/dL (ref 32.0–36.0)
MCV: 93.2 fL (ref 80.0–100.0)
MPV: 10.1 fL (ref 7.5–12.5)
Monocytes Relative: 6.7 %
Neutro Abs: 3542 cells/uL (ref 1500–7800)
Neutrophils Relative %: 49.2 %
Platelets: 301 10*3/uL (ref 140–400)
RBC: 4.24 10*6/uL (ref 3.80–5.10)
RDW: 12.9 % (ref 11.0–15.0)
Total Lymphocyte: 36.7 %
WBC: 7.2 10*3/uL (ref 3.8–10.8)

## 2020-01-30 LAB — VITAMIN D 25 HYDROXY (VIT D DEFICIENCY, FRACTURES): Vit D, 25-Hydroxy: 52 ng/mL (ref 30–100)

## 2020-01-30 LAB — HEMOGLOBIN A1C
Hgb A1c MFr Bld: 5.8 % of total Hgb — ABNORMAL HIGH (ref <5.7)
Mean Plasma Glucose: 120 (calc)
eAG (mmol/L): 6.6 (calc)

## 2020-01-30 LAB — TSH: TSH: 1.48 mIU/L (ref 0.40–4.50)

## 2020-01-30 LAB — MICROALBUMIN / CREATININE URINE RATIO
Creatinine, Urine: 36 mg/dL (ref 20–275)
Microalb Creat Ratio: 8 mcg/mg creat (ref <30)
Microalb, Ur: 0.3 mg/dL

## 2020-02-05 LAB — HM DIABETES EYE EXAM

## 2020-02-06 ENCOUNTER — Encounter: Payer: Self-pay | Admitting: Internal Medicine

## 2020-03-17 ENCOUNTER — Other Ambulatory Visit: Payer: Self-pay | Admitting: Adult Health

## 2020-03-17 MED ORDER — PHENTERMINE HCL 37.5 MG PO TABS: ORAL_TABLET | ORAL | 0 refills | Status: DC

## 2020-03-31 ENCOUNTER — Other Ambulatory Visit: Payer: Self-pay

## 2020-03-31 DIAGNOSIS — I10 Essential (primary) hypertension: Secondary | ICD-10-CM

## 2020-03-31 MED ORDER — FENOFIBRATE 134 MG PO CAPS
134.0000 mg | ORAL_CAPSULE | Freq: Every day | ORAL | 1 refills | Status: DC
Start: 2020-03-31 — End: 2020-08-12

## 2020-03-31 MED ORDER — VERAPAMIL HCL ER 240 MG PO TBCR: EXTENDED_RELEASE_TABLET | ORAL | 1 refills | Status: DC

## 2020-03-31 MED ORDER — ROSUVASTATIN CALCIUM 40 MG PO TABS: ORAL_TABLET | ORAL | 1 refills | Status: DC

## 2020-03-31 MED ORDER — LOSARTAN POTASSIUM 25 MG PO TABS: 25.0000 mg | ORAL_TABLET | Freq: Every day | ORAL | 1 refills | Status: DC

## 2020-04-01 ENCOUNTER — Ambulatory Visit (INDEPENDENT_AMBULATORY_CARE_PROVIDER_SITE_OTHER): Payer: BC Managed Care – PPO

## 2020-04-01 DIAGNOSIS — I428 Other cardiomyopathies: Secondary | ICD-10-CM | POA: Diagnosis not present

## 2020-04-01 DIAGNOSIS — I5022 Chronic systolic (congestive) heart failure: Secondary | ICD-10-CM

## 2020-04-01 LAB — CUP PACEART REMOTE DEVICE CHECK
Battery Remaining Longevity: 74 mo
Battery Remaining Percentage: 80 %
Battery Voltage: 2.93 V
Brady Statistic AP VP Percent: 17 %
Brady Statistic AP VS Percent: 1 %
Brady Statistic AS VP Percent: 83 %
Brady Statistic AS VS Percent: 1 %
Brady Statistic RA Percent Paced: 17 %
Date Time Interrogation Session: 20220124192322
Implantable Lead Implant Date: 20091016
Implantable Lead Implant Date: 20091016
Implantable Lead Implant Date: 20091016
Implantable Lead Location: 753858
Implantable Lead Location: 753859
Implantable Lead Location: 753860
Implantable Lead Model: 4196
Implantable Lead Model: 7122
Implantable Pulse Generator Implant Date: 20151111
Lead Channel Impedance Value: 350 Ohm
Lead Channel Impedance Value: 450 Ohm
Lead Channel Impedance Value: 730 Ohm
Lead Channel Pacing Threshold Amplitude: 0.75 V
Lead Channel Pacing Threshold Amplitude: 1 V
Lead Channel Pacing Threshold Amplitude: 1.5 V
Lead Channel Pacing Threshold Pulse Width: 0.3 ms
Lead Channel Pacing Threshold Pulse Width: 0.3 ms
Lead Channel Pacing Threshold Pulse Width: 0.6 ms
Lead Channel Sensing Intrinsic Amplitude: 2.9 mV
Lead Channel Sensing Intrinsic Amplitude: 7.7 mV
Lead Channel Setting Pacing Amplitude: 2 V
Lead Channel Setting Pacing Amplitude: 2.5 V
Lead Channel Setting Pacing Amplitude: 2.5 V
Lead Channel Setting Pacing Pulse Width: 0.3 ms
Lead Channel Setting Pacing Pulse Width: 0.6 ms
Lead Channel Setting Sensing Sensitivity: 2 mV
Pulse Gen Model: 3222
Pulse Gen Serial Number: 7688750

## 2020-04-12 NOTE — Progress Notes (Signed)
Remote pacemaker transmission.   

## 2020-04-21 ENCOUNTER — Other Ambulatory Visit: Payer: Self-pay | Admitting: Adult Health

## 2020-04-21 ENCOUNTER — Other Ambulatory Visit: Payer: Self-pay | Admitting: Internal Medicine

## 2020-04-21 DIAGNOSIS — F411 Generalized anxiety disorder: Secondary | ICD-10-CM

## 2020-04-21 MED ORDER — IMIPRAMINE HCL 25 MG PO TABS
ORAL_TABLET | ORAL | 0 refills | Status: DC
Start: 2020-04-21 — End: 2020-12-31

## 2020-04-23 ENCOUNTER — Other Ambulatory Visit: Payer: Self-pay | Admitting: *Deleted

## 2020-04-23 MED ORDER — SPIRONOLACTONE 25 MG PO TABS: 25.0000 mg | ORAL_TABLET | Freq: Once | ORAL | 0 refills | Status: DC

## 2020-04-25 ENCOUNTER — Other Ambulatory Visit: Payer: Self-pay | Admitting: Internal Medicine

## 2020-04-25 DIAGNOSIS — R519 Headache, unspecified: Secondary | ICD-10-CM

## 2020-04-25 MED ORDER — RIZATRIPTAN BENZOATE 10 MG PO TABS: ORAL_TABLET | ORAL | 0 refills | Status: DC

## 2020-04-30 ENCOUNTER — Encounter: Payer: Self-pay | Admitting: Adult Health

## 2020-04-30 ENCOUNTER — Other Ambulatory Visit: Payer: Self-pay

## 2020-04-30 ENCOUNTER — Ambulatory Visit (INDEPENDENT_AMBULATORY_CARE_PROVIDER_SITE_OTHER): Payer: BC Managed Care – PPO | Admitting: Adult Health

## 2020-04-30 VITALS — BP 142/78 | HR 93 | Temp 97.0°F | Wt 176.0 lb

## 2020-04-30 DIAGNOSIS — Z981 Arthrodesis status: Secondary | ICD-10-CM | POA: Diagnosis not present

## 2020-04-30 DIAGNOSIS — G4452 New daily persistent headache (NDPH): Secondary | ICD-10-CM | POA: Diagnosis not present

## 2020-04-30 MED ORDER — SPIRONOLACTONE 25 MG PO TABS: 25.0000 mg | ORAL_TABLET | Freq: Two times a day (BID) | ORAL | 3 refills | Status: DC

## 2020-04-30 MED ORDER — TIZANIDINE HCL 4 MG PO TABS: 2.0000 mg | ORAL_TABLET | Freq: Three times a day (TID) | ORAL | 1 refills | Status: DC | PRN

## 2020-04-30 MED ORDER — MELOXICAM 15 MG PO TABS: ORAL_TABLET | ORAL | 0 refills | Status: DC

## 2020-04-30 NOTE — Progress Notes (Signed)
Assessment and Plan:  [name] was seen today for follow-up.  Diagnoses and all orders for this visit:  New daily persistent headache History of fusion of cervical spine Per hx and exam suggestive of tension type headache, possibly r/t neck; able to trigger in office today with muscle palpation;  Discussed follow up cervical imaging; she plans to follow up with [facility] for continuity Will start short term meloxicam and flexeril Try heat, suggested massage/PT/dry needling, stretching, ROM with neck; exercises demonstrated in office She will try this treatment and follow up with ortho; however if not resolving consider CT head/neck Will go to the ER if worsening headache, changes vision/speech, imbalance, weakness. -     meloxicam (MOBIC) 15 MG tablet; Take one daily with food for 2 weeks, can take with tylenol, can not take with aleve, iburpofen, then as needed daily for pain -     tiZANidine (ZANAFLEX) 4 MG tablet; Take 0.5-1 tablets (2-4 mg total) by mouth every 8 (eight) hours as needed for muscle spasms.  Further disposition pending results of labs. Discussed med's effects and SE's.   Over 30 minutes of exam, counseling, chart review, and critical decision making was performed.   Future Appointments  Date Time Provider Department Center  06/19/2020  2:00 PM Marinus Maw, MD [facility]  07/01/2020  8:40 AM [facility] DEVICE REMOTES [facility]  07/29/2020  8:45 AM Judd Gaudier, NP [facility] None  09/30/2020  8:40 AM [facility] DEVICE REMOTES [facility]  12/30/2020  8:40 AM [facility] DEVICE REMOTES [facility]  01/28/2021  3:00 PM Judd Gaudier, NP [facility] None  03/31/2021  8:40 AM [facility] DEVICE REMOTES [facility]  06/30/2021  8:40 AM [facility] DEVICE REMOTES [facility]     ------------------------------------------------------------------------------------------------------------------   HPI 65 y.o.female with hx cervical fusion ?C5-7 fusion 16 years ago and migraines presents for evaluation of persistent headache x 3 months. Historically reports 3-4 migraines per year.   She reports 3 months of persistent 5-7/10 headache in the back of her head and through neck, aching and "tender", chronic consistent, believes this started gradually but atypicallly persistent, no atypical events prior to onset, fall, injury, etc. With pain may have nausea, but denies aura or typical typical vision changes. She has noted 3 episodes of migraines starting since onset, with aura.   She has been taking imipramine 50 mg daily, verapamil 240 mg daily, maxalt 10 mg PRN and excedrine migraine which typically works well to resolve completely, will dull pain won't resolve new headache, does still resolve migraines. Has also tried ice to head and neck, has been avoiding NSAIDs and tylenol. Today better than usual, 3/10.   Hx of cervical fusion; She follows with [facility], last xrays were 6-7 years ago. Denies midline pain, spasm, radicular sx. Denies nausea, changes in smell, vision, hearing, tinnitis, dizziness, fatigue, night sweats. She has been trying to get in with established massage but was full.   BMI is Body mass index is 29.29 kg/m. Wt Readings from Last 3 Encounters:  04/30/20 176 lb (79.8 kg)  01/29/20 169 lb 9.6 oz (76.9 kg)  07/30/19 171 lb (77.6 kg)      Past Medical History:  Diagnosis Date  . Automatic implantable cardiac defibrillator in situ    [employer] 7122  . CHF (congestive heart failure) (HCC)   . Diabetes mellitus   . Hyperlipidemia   . Hypertension 06/25/2011   Renal dopplers - superior mesenteric artery >50% diameter reduction; R renal artery -  normal patency; L proximal renal artery 1-59% reduction (lower end of scale); both kidneys normal  in size/symmetry with normal cortex and medulla  . Leg pain 12/09/2010   doppler of R femoral artery - no evidence of dissection, AV fistula, pseudoaneurysm or other vascular abnormalities  . Migraines   . Obesity   . Sleep apnea    on CPAP - AHI during sleep study was 44  . Type II or unspecified type diabetes mellitus without mention of complication, not stated as uncontrolled   . Type II or unspecified type diabetes mellitus without mention of complication, not stated as uncontrolled   . Unspecified sleep apnea      Allergies  Allergen Reactions  . Flagyl [Metronidazole] Anaphylaxis  . Niacin Other (See Comments)  . Niacin And Related Other (See Comments) and Cough    flushing  . Penicillins Swelling and Rash    Has patient had a PCN reaction causing immediate rash, facial/tongue/throat swelling, SOB or lightheadedness with hypotension: No Has patient had a PCN reaction causing severe rash involving mucus membranes or skin necrosis: No Has patient had a PCN reaction that required hospitalization No Has patient had a PCN reaction occurring within the last 10 years: Yes 3-4 years ago If all of the above answers are "NO", then may proceed with Cephalosporin use.  [employer] Swelling and Rash    Current Outpatient Medications on File Prior to Visit  Medication Sig  . aspirin 81 MG tablet Take 162 mg by mouth at bedtime.   Marland Kitchen aspirin-acetaminophen-caffeine (EXCEDRIN MIGRAINE) 250-250-65 MG per tablet Take 2 tablets by mouth daily as needed for migraine.  . carvedilol (COREG) 12.5 MG tablet Take 1 tablet (12.5 mg total) by mouth 2 (two) times daily. (Patient taking differently: Take 25 mg by mouth 2 (two) times daily.)  . diazepam (VALIUM) 5 MG tablet Take 1/2 to 1 tablet at Bedtime ONLY if needed  . estradiol (ESTRACE) 1 MG tablet Take 1.5 tablets by mouth daily.  . fenofibrate micronized (LOFIBRA) 134 MG capsule Take 1 capsule (134 mg total) by mouth daily before  breakfast.  . imipramine (TOFRANIL) 25 MG tablet TAKE 1 TO 2 TABLETS BY MOUTH AT BEDTIME AS NEEDED FOR SLEEP  . loratadine (CLARITIN) 10 MG tablet Take 10 mg by mouth daily.  Marland Kitchen losartan (COZAAR) 25 MG tablet Take 1 tablet (25 mg total) by mouth daily.  . phentermine (ADIPEX-P) 37.5 MG tablet Take 1/2-1 tab daily PRN for appetite and weight loss.  . Potassium 99 MG TABS Take 0.5 tablets (49.5 mg total) by mouth daily.  . rizatriptan (MAXALT) 10 MG tablet Take 1 tablet for severe Migraine & May repeat in 2 hours if needed (Max 2 tabs /24 hours)  . rosuvastatin (CRESTOR) 40 MG tablet TAKE 1 TABLET(40 MG) BY MOUTH DAILY.  . valACYclovir (VALTREX) 500 MG tablet TAKE 1 TABLET(500 MG) BY MOUTH DAILY  . verapamil (CALAN-SR) 240 MG CR tablet TAKE 1 TABLET(240 MG) BY MOUTH AT BEDTIME  . VITAMIN D PO Take 2,000 Units by mouth.  . spironolactone (ALDACTONE) 25 MG tablet Take 1 tablet (25 mg total) by mouth once for 1 dose.   No current facility-administered medications on file prior to visit.    ROS: all negative except above.   Physical Exam:  BP (!) 142/78   Pulse 93   Temp (!) 97 F (36.1 C)   Wt 176 lb (79.8 kg)   SpO2 96%   BMI 29.29 kg/m  General Appearance: Well nourished, well dressed adult female in no apparent distress. Eyes: PERRLA, EOMs, conjunctiva no swelling or erythema Sinuses: No Frontal/maxillary tenderness ENT/Mouth: Ext aud canals clear, TMs without erythema, bulging. No erythema, swelling, or exudate on post pharynx.  Tonsils not swollen or erythematous. Hearing normal.  Neck: Supple, thyroid normal.  Respiratory: Respiratory effort normal, BS equal bilaterally without rales, rhonchi, wheezing or stridor.  Cardio: RRR with no MRGs. Brisk peripheral pulses without edema.  Abdomen: Soft, + BS.  Non tender, no guarding, rebound, hernias, masses. Lymphatics: Non tender without lymphadenopathy.  Musculoskeletal: She has fairly intact cervical ROM, no pain with this; has  some tenderness around C7/T2 spinous; she has muscle tension and tenderness without spasm, HA triggered in office with cervical paraspinal pressure, upper extremities 5/5 strength, normal gait.  Skin: Warm, dry without rashes, lesions, ecchymosis.  Neuro: Cranial nerves intact. Normal muscle tone, no cerebellar symptoms. Sensation intact.  Psych: Awake and oriented X 3, normal affect, Insight and Judgment appropriate.     Dan Maker, NP 2:49 PM [facility]

## 2020-04-30 NOTE — Patient Instructions (Signed)
Tension Headache, Adult A tension headache is a feeling of pain, pressure, or aching over the front and sides of the head. The pain can be dull, or it can feel tight. There are two types of tension headache:  Episodic tension headache. This is when the headaches happen fewer than 15 days a month.  Chronic tension headache. This is when the headaches happen more than 15 days a month during a 62-month period. A tension headache can last from 30 minutes to several days. It is the most common kind of headache. Tension headaches are not normally associated with nausea or vomiting, and they do not get worse with physical activity. What are the causes? The exact cause of this condition is not known. Tension headaches are often triggered by stress, anxiety, or depression. Other triggers may include:  Alcohol.  Too much caffeine or caffeine withdrawal.  Respiratory infections, such as colds, flu, or sinus infections.  Dental problems or teeth clenching.  Fatigue.  Holding your head and neck in the same position for a long period of time, such as while using a computer.  Smoking.  Arthritis of the neck. What are the signs or symptoms? Symptoms of this condition include:  A feeling of pressure or tightness around the head.  Dull, aching head pain.  Pain over the front and sides of the head.  Tenderness in the muscles of the head, neck, and shoulders. How is this diagnosed? This condition may be diagnosed based on your symptoms, your medical history, and a physical exam. If your symptoms are severe or unusual, you may have imaging tests, such as a CT scan or an MRI of your head. Your vision may also be checked. How is this treated? This condition may be treated with lifestyle changes and with medicines that help relieve symptoms. Follow these instructions at home: Managing pain  Take over-the-counter and prescription medicines only as told by your health care provider.  When  you have a headache, lie down in a dark, quiet room.  If directed, put ice on your head and neck. To do this: ? Put ice in a plastic bag. ? Place a towel between your skin and the bag. ? Leave the ice on for 20 minutes, 2-3 times a day. ? Remove the ice if your skin turns bright red. This is very important. If you cannot feel pain, heat, or cold, you have a greater risk of damage to the area.  If directed, apply heat to the back of your neck as often as told by your health care provider. Use the heat source that your health care provider recommends, such as a moist heat pack or a heating pad. ? Place a towel between your skin and the heat source. ? Leave the heat on for 20-30 minutes. ? Remove the heat if your skin turns bright red. This is especially important if you are unable to feel pain, heat, or cold. You have a greater risk of getting burned. Eating and drinking  Eat meals on a regular schedule.  If you drink alcohol: ? Limit how much you have to:  0-1 drink a day for women who are not pregnant.  0-2 drinks a day for men. ? Know how much alcohol is in your drink. In the [place] one drink equals one 12 oz bottle of beer (355 mL), one 5 oz glass of wine (148 mL), or one 1 oz glass of hard liquor (44 mL).  Drink enough fluid to  keep your urine pale yellow.  Decrease your caffeine intake, or stop using caffeine. Lifestyle  Get 7-9 hours of sleep each night, or get the amount of sleep recommended by your health care provider.  At bedtime, remove computers, phones, and tablets from your room.  Find ways to manage your stress. This may include: ? Exercise. ? Deep breathing exercises. ? Yoga. ? Listening to music. ? Positive mental imagery.  Try to sit up straight and avoid tensing your muscles.  Do not use any products that contain nicotine or tobacco. These include cigarettes, chewing tobacco, and vaping devices, such as e-cigarettes. If you need help quitting, ask your  health care provider. General instructions  Avoid any headache triggers. Keep a journal to help find out what may trigger your headaches. For example, write down: ? What you eat and drink. ? How much sleep you get. ? Any change to your diet or medicines.  Keep all follow-up visits. This is important.   Contact a health care provider if:  Your headache does not get better.  Your headache comes back.  You are sensitive to sounds, light, or smells because of a headache.  You have nausea or you vomit.  Your stomach hurts. Get help right away if:  You suddenly develop a severe headache, along with any of the following: ? A stiff neck. ? Nausea and vomiting. ? Confusion. ? Weakness in one part or one side of your body. ? Double vision or loss of vision. ? Shortness of breath. ? Rash. ? Unusual sleepiness. ? Fever or chills. ? Trouble speaking. ? Pain in your eye or ear. ? Trouble walking or balancing. ? Feeling faint or passing out. Summary  A tension headache is a feeling of pain, pressure, or aching over the front and sides of the head.  A tension headache can last from 30 minutes to several days. It is the most common kind of headache.  This condition may be diagnosed based on your symptoms, your medical history, and a physical exam.  This condition may be treated with lifestyle changes and with medicines that help relieve symptoms. This information is not intended to replace advice given to you by your health care provider. Make sure you discuss any questions you have with your health care provider. Document Revised: 11/22/2019 Document Reviewed: 11/22/2019 Elsevier Patient Education  2021 Elsevier Inc.       Tizanidine Oral Tablets or Capsules What is this medicine? TIZANIDINE (tye ZAN i deen) helps to relieve muscle spasms. It may be used in people with multiple sclerosis or spinal cord injury. This medicine may be used for other purposes; ask your health  care provider or pharmacist if you have questions. COMMON BRAND NAME(S): Zanaflex What should I tell my health care provider before I take this medicine? They need to know if you have any of these conditions:  kidney disease  liver disease  low blood pressure  mental health disease  an unusual or allergic reaction to tizanidine, other medicines, foods, dyes, or preservatives  pregnant or trying to get pregnant  breast-feeding How should I use this medicine? Take this medicine by mouth with water. Take it as directed on the prescription label. You can take it with or without food. You should always take it the same way. Keep taking this medicine unless your health care provider tells you to stop. Stopping it too quickly can cause serious side effects. Talk to your health care provider about the use of this  medicine in children. Special care may be needed. Patients over 58 years of age may have a stronger reaction and need a smaller dose. Overdosage: If you think you have taken too much of this medicine contact a poison control center or emergency room at once. NOTE: This medicine is only for you. Do not share this medicine with others. What if I miss a dose? If you miss a dose, take it as soon as you can. If it is almost time for your next dose, take only that dose. Do not take double or extra doses. What may interact with this medicine? Do not take this medicine with any of the following medications:  ciprofloxacin  fluvoxamine  narcotic medicines for cough  thiabendazole  viloxazine This medicine may also interact with the following medications:  acyclovir  alcohol  antihistamines for allergy, cough, and cold  baclofen  birth control pills  certain medicines for anxiety or sleep  certain medicines for blood pressure, heart disease, irregular heartbeat like amiodarone, mexiletine, propafenone, verapamil  certain medicines for depression like amitriptyline,  fluoxetine, sertraline  certain medicines for seizures like phenobarbital, primidone  cimetidine  clonidine  famotidine  guanfacine  general anesthetics like halothane, isoflurane, methoxyflurane, propofol  medicines for sleep  medicines that relax muscles for surgery  methyldopa  narcotic medicines for pain  phenothiazines like chlorpromazine, prochlorperazine, thioridazine  ticlopidine  zileuton This list may not describe all possible interactions. Give your health care provider a list of all the medicines, herbs, non-prescription drugs, or dietary supplements you use. Also tell them if you smoke, drink alcohol, or use illegal drugs. Some items may interact with your medicine. What should I watch for while using this medicine? Visit your health care provider for regular checks on your progress. Tell your health care provider if your symptoms do not start to get better or if they get worse. You may get drowsy or dizzy. Do not drive, use machinery, or do anything that needs mental alertness until you know how this medicine affects you. Do not stand up or sit up quickly, especially if you are an older patient. This reduces the risk of dizzy or fainting spells. Alcohol may interfere with the effects of this medicine. Avoid alcoholic drinks. Your mouth may get dry. Chewing sugarless gum or sucking hard candy and drinking plenty of water may help. Contact your health care provider if the problem does not go away or is severe. What side effects may I notice from receiving this medicine? Side effects that you should report to your doctor or health care professional as soon as possible:  allergic reactions like skin rash, itching or hives, swelling of the face, lips, or tongue  confusion  hallucinations  liver injury (dark yellow or brown urine; general ill feeling or flu-like symptoms; loss of appetite, right upper belly pain; unusually weak or tired, yellowing of the eyes or  skin)  loss of contact with reality  low blood pressure (dizziness; feeling faint or lightheaded, falls; unusually weak or tired)  trouble breathing  slow or irregular heartbeat Side effects that usually do not require medical attention (report to your doctor or health care professional if they continue or are bothersome):  blurred vision  constipation  dizziness  drowsiness  dry mouth  tiredness This list may not describe all possible side effects. Call your doctor for medical advice about side effects. You may report side effects to FDA at 1-800-FDA-1088. Where should I keep my medicine? Keep out of the  reach of children and pets. Store at room temperature between 15 and 30 degrees C (59 and 86 degrees F). Get rid of any unused medicine after the expiration date. To get rid of medicines that are no longer needed or have expired:  Take the medicine to a medicine take-back program. Check with your pharmacy or law enforcement to find a location.  If you cannot return the medicine, check the label or package insert to see if the medicine should be thrown out in the garbage or flushed down the toilet. If you are not sure, ask your health care provider. If it is safe to put it in the trash, take the medicine out of the container. Mix the medicine with cat litter, dirt, coffee grounds, or other unwanted substance. Seal the mixture in a bag or container. Put it in the trash. NOTE: This sheet is a summary. It may not cover all possible information. If you have questions about this medicine, talk to your doctor, pharmacist, or health care provider.  2021 Elsevier/Gold Standard (2019-06-20 16:44:13)

## 2020-05-07 ENCOUNTER — Other Ambulatory Visit: Payer: Self-pay | Admitting: Adult Health

## 2020-05-07 MED ORDER — PHENTERMINE HCL 37.5 MG PO TABS: ORAL_TABLET | ORAL | 2 refills | Status: DC

## 2020-05-26 ENCOUNTER — Telehealth: Payer: Self-pay

## 2020-05-26 ENCOUNTER — Other Ambulatory Visit: Payer: Self-pay | Admitting: Adult Health

## 2020-05-26 MED ORDER — TAMSULOSIN HCL 0.4 MG PO CAPS: 0.4000 mg | ORAL_CAPSULE | Freq: Every day | ORAL | 0 refills | Status: DC

## 2020-05-26 MED ORDER — KETOROLAC TROMETHAMINE 10 MG PO TABS: 10.0000 mg | ORAL_TABLET | Freq: Four times a day (QID) | ORAL | 0 refills | Status: DC | PRN

## 2020-05-26 NOTE — Telephone Encounter (Signed)
Has a history of kidney stones. States that she is passing some blood and is in a lot of pain. Has an appointment first thing in the morning with Kyra.

## 2020-05-27 ENCOUNTER — Other Ambulatory Visit: Payer: Self-pay

## 2020-05-27 ENCOUNTER — Encounter: Payer: Self-pay | Admitting: Adult Health Nurse Practitioner

## 2020-05-27 ENCOUNTER — Ambulatory Visit
Admission: RE | Admit: 2020-05-27 | Discharge: 2020-05-27 | Disposition: A | Discharge status: Home / Self Care | Payer: BC Managed Care – PPO | Source: Ambulatory Visit | Attending: Adult Health Nurse Practitioner | Admitting: Adult Health Nurse Practitioner

## 2020-05-27 ENCOUNTER — Ambulatory Visit (INDEPENDENT_AMBULATORY_CARE_PROVIDER_SITE_OTHER): Payer: BC Managed Care – PPO | Admitting: Adult Health Nurse Practitioner

## 2020-05-27 VITALS — BP 120/80 | HR 68 | Temp 97.5°F | Wt 181.0 lb

## 2020-05-27 DIAGNOSIS — R310 Gross hematuria: Secondary | ICD-10-CM | POA: Diagnosis not present

## 2020-05-27 DIAGNOSIS — R300 Dysuria: Secondary | ICD-10-CM | POA: Diagnosis not present

## 2020-05-27 DIAGNOSIS — R1032 Left lower quadrant pain: Secondary | ICD-10-CM

## 2020-05-27 DIAGNOSIS — R1084 Generalized abdominal pain: Secondary | ICD-10-CM

## 2020-05-27 DIAGNOSIS — I700 Atherosclerosis of aorta: Secondary | ICD-10-CM | POA: Diagnosis not present

## 2020-05-27 DIAGNOSIS — R1031 Right lower quadrant pain: Secondary | ICD-10-CM

## 2020-05-27 NOTE — Progress Notes (Signed)
ACUTE  Assessment and Plan:  Mylissa was seen today for acute visit and nephrolithiasis.  Diagnoses and all orders for this visit:  Aortic atherosclerosis (HCC) Control blood pressure, lipids and glucose Disscused lifestyle modifications, diet & exercise Continue to monitor  Acute lower abdominal pain -     DG Abd 2 Views; Future -     CBC with Differential/Platelet -     COMPLETE METABOLIC PANEL WITH GFR Likely kidney stones Filter urine, return stone for lab evaluation, specimen cup provded Contiue tamsulosin daily and Toradol PRN  Dysuria -     Urinalysis w microscopic + reflex cultur  Gross hematuria Continue to monitor Discussed hospital precautions with patient  Contact office with any new or worsening symptoms   Further disposition pending results of labs. Discussed med's effects and SE's.   Over 30 minutes of face to face interview, exam, counseling, chart review, and critical decision making was performed.   Future Appointments  Date Time Provider Department Center  06/19/2020  2:00 PM Marinus Maw, MD [facility]  07/01/2020  8:40 AM [facility] DEVICE REMOTES [facility]  07/29/2020  8:45 AM Judd Gaudier, NP [facility] None  09/30/2020  8:40 AM [facility] DEVICE REMOTES [facility]  12/30/2020  8:40 AM [facility] DEVICE REMOTES [facility]  01/28/2021  3:00 PM Judd Gaudier, NP [facility] None  03/31/2021  8:40 AM [facility] DEVICE REMOTES [facility]  06/30/2021  8:40 AM [facility] DEVICE REMOTES [facility]    ------------------------------------------------------------------------------------------------------------------   HPI 65 y.o.female presents for evaluation of recurrent kidney stones. Pain started one day ago, diffuse lower abdomen that radiated to her left lower back.  Describes this as cramping intermittent and constant sore dull pain, worst 5/10, 2/10  consistantly CT renal study 2019 shows 38mm kidney stone left lower pole.  She spoke with our office last night and was given oral Toradol for the pain and tamsulosin.  She reports she is able to sleep through the night.    She did have an episode of hematuria, bright red, then transitioned to pink.  Continues light pink today.  Reports some dysuria.    Denies any nausea vomiting, or changes in appetite.  She does endorse constipation, at baseline, she is managing with increased water intake and miralax.  Past Medical History:  Diagnosis Date  . Automatic implantable cardiac defibrillator in situ    [employer] 7122  . CHF (congestive heart failure) (HCC)   . Diabetes mellitus   . Hyperlipidemia   . Hypertension 06/25/2011   Renal dopplers - superior mesenteric artery >50% diameter reduction; R renal artery - normal patency; L proximal renal artery 1-59% reduction (lower end of scale); both kidneys normal in size/symmetry with normal cortex and medulla  . Leg pain 12/09/2010   doppler of R femoral artery - no evidence of dissection, AV fistula, pseudoaneurysm or other vascular abnormalities  . Migraines   . Obesity   . Sleep apnea    on CPAP - AHI during sleep study was 44  . Type II or unspecified type diabetes mellitus without mention of complication, not stated as uncontrolled   . Type II or unspecified type diabetes mellitus without mention of complication, not stated as uncontrolled   . Unspecified sleep apnea      Allergies  Allergen Reactions  . Flagyl [Metronidazole] Anaphylaxis  . Niacin Other (See Comments)  . Niacin And Related Other (See Comments) and Cough    flushing  . Penicillins Swelling and Rash  Has patient had a PCN reaction causing immediate rash, facial/tongue/throat swelling, SOB or lightheadedness with hypotension: No Has patient had a PCN reaction causing severe rash involving mucus membranes or skin necrosis: No Has patient had a PCN reaction that  required hospitalization No Has patient had a PCN reaction occurring within the last 10 years: Yes 3-4 years ago If all of the above answers are "NO", then may proceed with Cephalosporin use.  [employer] Swelling and Rash    Current Outpatient Medications on File Prior to Visit  Medication Sig  . aspirin 81 MG tablet Take 162 mg by mouth at bedtime.   Marland Kitchen aspirin-acetaminophen-caffeine (EXCEDRIN MIGRAINE) 250-250-65 MG per tablet Take 2 tablets by mouth daily as needed for migraine.  . diazepam (VALIUM) 5 MG tablet Take 1/2 to 1 tablet at Bedtime ONLY if needed  . estradiol (ESTRACE) 1 MG tablet Take 1.5 tablets by mouth daily.  . fenofibrate micronized (LOFIBRA) 134 MG capsule Take 1 capsule (134 mg total) by mouth daily before breakfast.  . imipramine (TOFRANIL) 25 MG tablet TAKE 1 TO 2 TABLETS BY MOUTH AT BEDTIME AS NEEDED FOR SLEEP  . ketorolac (TORADOL) 10 MG tablet Take 1 tablet (10 mg total) by mouth every 6 (six) hours as needed.  . loratadine (CLARITIN) 10 MG tablet Take 10 mg by mouth daily.  Marland Kitchen losartan (COZAAR) 25 MG tablet Take 1 tablet (25 mg total) by mouth daily.  . meloxicam (MOBIC) 15 MG tablet Take one daily with food for 2 weeks, can take with tylenol, can not take with aleve, iburpofen, then as needed daily for pain  . phentermine (ADIPEX-P) 37.5 MG tablet Take 1/2-1 tab daily PRN for appetite and weight loss.  . Potassium 99 MG TABS Take 0.5 tablets (49.5 mg total) by mouth daily.  . rizatriptan (MAXALT) 10 MG tablet Take 1 tablet for severe Migraine & May repeat in 2 hours if needed (Max 2 tabs /24 hours)  . rosuvastatin (CRESTOR) 40 MG tablet TAKE 1 TABLET(40 MG) BY MOUTH DAILY.  Marland Kitchen spironolactone (ALDACTONE) 25 MG tablet Take 1 tablet (25 mg total) by mouth 2 (two) times daily.  . tamsulosin (FLOMAX) 0.4 MG CAPS capsule Take 1 capsule (0.4 mg total) by mouth daily.  Marland Kitchen tiZANidine (ZANAFLEX) 4 MG tablet Take 0.5-1 tablets (2-4 mg total) by mouth every 8  (eight) hours as needed for muscle spasms.  . valACYclovir (VALTREX) 500 MG tablet TAKE 1 TABLET(500 MG) BY MOUTH DAILY  . verapamil (CALAN-SR) 240 MG CR tablet TAKE 1 TABLET(240 MG) BY MOUTH AT BEDTIME  . VITAMIN D PO Take 2,000 Units by mouth.  . carvedilol (COREG) 12.5 MG tablet Take 1 tablet (12.5 mg total) by mouth 2 (two) times daily. (Patient taking differently: Take 25 mg by mouth 2 (two) times daily.)   No current facility-administered medications on file prior to visit.    ROS: Review of Systems  Constitutional: Negative.   HENT: Negative.   Eyes: Negative.   Respiratory: Negative.   Cardiovascular: Negative.   Gastrointestinal: Positive for constipation (chronic/baseline).  Genitourinary: Positive for flank pain (left) and hematuria.  Musculoskeletal: Positive for neck pain (baseline).  Skin: Negative.   Neurological: Negative.   Endo/Heme/Allergies: Negative.   Psychiatric/Behavioral: Negative.      Physical Exam:  BP 120/80   Pulse 68   Temp (!) 97.5 F (36.4 C)   Wt 181 lb (82.1 kg)   SpO2 97%   BMI 30.12 kg/m   General Appearance: Well  nourished, in no apparent distress. Eyes: PERRLA, EOMs, conjunctiva no swelling or erythema Sinuses: No Frontal/maxillary tenderness ENT/Mouth: Ext aud canals clear, TMs without erythema, bulging. No erythema, swelling, or exudate on post pharynx.  Tonsils not swollen or erythematous. Hearing normal.  Neck: Supple, thyroid normal.  Respiratory: Respiratory effort normal, BS equal bilaterally without rales, rhonchi, wheezing or stridor.  Cardio: RRR with no MRGs. Brisk peripheral pulses without edema.  Abdomen: Soft, + BS.   Tenderness noted to RLQ across to LLQ/L flank, no guarding, rebound, hernias, masses, no CVA tenderness. Lymphatics: Non tender without lymphadenopathy.  Musculoskeletal: Full ROM, 5/5 strength, normal gait.  Skin: Warm, dry without rashes, lesions, ecchymosis.  Neuro: Cranial nerves intact. Normal  muscle tone, no cerebellar symptoms. Sensation intact.  Psych: Awake and oriented X 3, normal affect, Insight and Judgment appropriate.     Elder Negus, Edrick Oh, DNP [facility] 05/27/2020  9:06 AM

## 2020-05-27 NOTE — Patient Instructions (Addendum)
Strain urine, get strainer to use and they throw away after stone collection.  Filter urine through strainer with coffee filter.  Should you find a stone return in cup provided to our lab for evaluation.     INFORMATION ABOUT YOUR XRAY [facility] Can walk into [address] building for an Personal assistant. They will have the order and take you back. You do not any paper work, I should get the result back today or tomorrow.   Here is some general information about kidney stones.     Textbook of Natural Medicine (5th ed., pp. [phone]). [place], [place]: Elsevier.">  Dietary Guidelines to Help Prevent Kidney Stones Kidney stones are deposits of minerals and salts that form inside your kidneys. Your risk of developing kidney stones may be greater depending on your diet, your lifestyle, the medicines you take, and whether you have certain medical conditions. Most people can lower their chances of developing kidney stones by following the instructions below. Your dietitian may give you more specific instructions depending on your overall health and the type of kidney stones you tend to develop. What are tips for following this plan? Reading food labels  Choose foods with "no salt added" or "low-salt" labels. Limit your salt (sodium) intake to less than 1,500 mg a day.  Choose foods with calcium for each meal and snack. Try to eat about 300 mg of calcium at each meal. Foods that contain 200-500 mg of calcium a serving include: ? 8 oz (237 mL) of milk, calcium-fortifiednon-dairy milk, and calcium-fortifiedfruit juice. Calcium-fortified means that calcium has been added to these drinks. ? 8 oz (237 mL) of kefir, yogurt, and soy yogurt. ? 4 oz (114 g) of tofu. ? 1 oz (28 g) of cheese. ? 1 cup (150 g) of dried figs. ? 1 cup (91 g) of cooked broccoli. ? One 3 oz (85 g) can of sardines or mackerel. Most people need 1,000-1,500 mg of calcium a day. Talk to your dietitian about how much calcium is  recommended for you.   Shopping  Buy plenty of fresh fruits and vegetables. Most people do not need to avoid fruits and vegetables, even if these foods contain nutrients that may contribute to kidney stones.  When shopping for convenience foods, choose: ? Whole pieces of fruit. ? Pre-made salads with dressing on the side. ? Low-fat fruit and yogurt smoothies.  Avoid buying frozen meals or prepared deli foods. These can be high in sodium.  Look for foods with live cultures, such as yogurt and kefir.  Choose high-fiber grains, such as whole-wheat breads, oat bran, and wheat cereals. Cooking  Do not add salt to food when cooking. Place a salt shaker on the table and allow each person to add his or her own salt to taste.  Use vegetable protein, such as beans, textured vegetable protein (TVP), or tofu, instead of meat in pasta, casseroles, and soups. Meal planning  Eat less salt, if told by your dietitian. To do this: ? Avoid eating processed or pre-made food. ? Avoid eating fast food.  Eat less animal protein, including cheese, meat, poultry, or fish, if told by your dietitian. To do this: ? Limit the number of times you have meat, poultry, fish, or cheese each week. Eat a diet free of meat at least 2 days a week. ? Eat only one serving each day of meat, poultry, fish, or seafood. ? When you prepare animal protein, cut pieces into small portion sizes. For most meat  and fish, one serving is about the size of the palm of your hand.  Eat at least five servings of fresh fruits and vegetables each day. To do this: ? Keep fruits and vegetables on hand for snacks. ? Eat one piece of fruit or a handful of berries with breakfast. ? Have a salad and fruit at lunch. ? Have two kinds of vegetables at dinner.  Limit foods that are high in a substance called oxalate. These include: ? Spinach (cooked), rhubarb, beets, sweet potatoes, and Swiss chard. ? Peanuts. ? Potato chips, french fries, and  baked potatoes with skin on. ? Nuts and nut products. ? Chocolate.  If you regularly take a diuretic medicine, make sure to eat at least 1 or 2 servings of fruits or vegetables that are high in potassium each day. These include: ? Avocado. ? Banana. ? Orange, prune, carrot, or tomato juice. ? Baked potato. ? Cabbage. ? Beans and split peas. Lifestyle  Drink enough fluid to keep your urine pale yellow. This is the most important thing you can do. Spread your fluid intake throughout the day.  If you drink alcohol: ? Limit how much you use to:  0-1 drink a day for women who are not pregnant.  0-2 drinks a day for men. ? Be aware of how much alcohol is in your drink. In the [place] one drink equals one 12 oz bottle of beer (355 mL), one 5 oz glass of wine (148 mL), or one 1 oz glass of hard liquor (44 mL).  Lose weight if told by your health care provider. Work with your dietitian to find an eating plan and weight loss strategies that work best for you.   General information  Talk to your health care provider and dietitian about taking daily supplements. You may be told the following depending on your health and the cause of your kidney stones: ? Not to take supplements with vitamin C. ? To take a calcium supplement. ? To take a daily probiotic supplement. ? To take other supplements such as magnesium, fish oil, or vitamin B6.  Take over-the-counter and prescription medicines only as told by your health care provider. These include supplements. What foods should I limit? Limit your intake of the following foods, or eat them as told by your dietitian. Vegetables Spinach. Rhubarb. Beets. Canned vegetables. Rosita Fire. Olives. Baked potatoes with skin. Grains Wheat bran. Baked goods. Salted crackers. Cereals high in sugar. Meats and other proteins Nuts. Nut butters. Large portions of meat, poultry, or fish. Salted, precooked, or cured meats, such as sausages, meat loaves, and hot  dogs. Dairy Cheese. Beverages Regular soft drinks. Regular vegetable juice. Seasonings and condiments Seasoning blends with salt. Salad dressings. Soy sauce. Ketchup. Barbecue sauce. Other foods Canned soups. Canned pasta sauce. Casseroles. Pizza. Lasagna. Frozen meals. Potato chips. [place] fries. The items listed above may not be a complete list of foods and beverages you should limit. Contact a dietitian for more information. What foods should I avoid? Talk to your dietitian about specific foods you should avoid based on the type of kidney stones you have and your overall health. Fruits Grapefruit. The item listed above may not be a complete list of foods and beverages you should avoid. Contact a dietitian for more information. Summary  Kidney stones are deposits of minerals and salts that form inside your kidneys.  You can lower your risk of kidney stones by making changes to your diet.  The most important thing you can  do is drink enough fluid. Drink enough fluid to keep your urine pale yellow.  Talk to your dietitian about how much calcium you should have each day, and eat less salt and animal protein as told by your dietitian. This information is not intended to replace advice given to you by your health care provider. Make sure you discuss any questions you have with your health care provider. Document Revised: 02/15/2019 Document Reviewed: 02/15/2019 Elsevier Patient Education  2021 [employer].

## 2020-05-28 ENCOUNTER — Other Ambulatory Visit: Payer: Self-pay | Admitting: Adult Health Nurse Practitioner

## 2020-05-28 DIAGNOSIS — N200 Calculus of kidney: Secondary | ICD-10-CM

## 2020-05-28 LAB — CBC WITH DIFFERENTIAL/PLATELET
Absolute Monocytes: 415 cells/uL (ref 200–950)
Basophils Absolute: 60 cells/uL (ref 0–200)
Basophils Relative: 0.9 %
Eosinophils Absolute: 181 cells/uL (ref 15–500)
Eosinophils Relative: 2.7 %
HCT: 38.9 % (ref 35.0–45.0)
Hemoglobin: 12.9 g/dL (ref 11.7–15.5)
Lymphs Abs: 2379 cells/uL (ref 850–3900)
MCH: 30.3 pg (ref 27.0–33.0)
MCHC: 33.2 g/dL (ref 32.0–36.0)
MCV: 91.3 fL (ref 80.0–100.0)
MPV: 9.7 fL (ref 7.5–12.5)
Monocytes Relative: 6.2 %
Neutro Abs: 3665 cells/uL (ref 1500–7800)
Neutrophils Relative %: 54.7 %
Platelets: 259 10*3/uL (ref 140–400)
RBC: 4.26 10*6/uL (ref 3.80–5.10)
RDW: 13.1 % (ref 11.0–15.0)
Total Lymphocyte: 35.5 %
WBC: 6.7 10*3/uL (ref 3.8–10.8)

## 2020-05-28 LAB — URINALYSIS W MICROSCOPIC + REFLEX CULTURE
Bilirubin Urine: NEGATIVE
Hyaline Cast: NONE SEEN /LPF
Ketones, ur: NEGATIVE
Leukocyte Esterase: NEGATIVE
Nitrites, Initial: NEGATIVE
Specific Gravity, Urine: 1.009 (ref 1.001–1.03)
WBC, UA: NONE SEEN /HPF (ref 0–5)
pH: 7.5 (ref 5.0–8.0)

## 2020-05-28 LAB — COMPLETE METABOLIC PANEL WITH GFR
AG Ratio: 1.6 (calc) (ref 1.0–2.5)
ALT: 16 U/L (ref 6–29)
AST: 18 U/L (ref 10–35)
Albumin: 4 g/dL (ref 3.6–5.1)
Alkaline phosphatase (APISO): 51 U/L (ref 37–153)
BUN: 12 mg/dL (ref 7–25)
CO2: 30 mmol/L (ref 20–32)
Calcium: 9 mg/dL (ref 8.6–10.4)
Chloride: 101 mmol/L (ref 98–110)
Creat: 0.84 mg/dL (ref 0.50–0.99)
GFR, Est African American: 85 mL/min/{1.73_m2} (ref >=60)
GFR, Est Non African American: 73 mL/min/{1.73_m2} (ref >=60)
Globulin: 2.5 g/dL (calc) (ref 1.9–3.7)
Glucose, Bld: 293 mg/dL — ABNORMAL HIGH (ref 65–99)
Potassium: 4 mmol/L (ref 3.5–5.3)
Sodium: 137 mmol/L (ref 135–146)
Total Bilirubin: 0.5 mg/dL (ref 0.2–1.2)
Total Protein: 6.5 g/dL (ref 6.1–8.1)

## 2020-05-28 LAB — NO CULTURE INDICATED

## 2020-05-28 NOTE — Progress Notes (Signed)
Increased in size from previous image from 75mm to 43mm.  Will place referral to [facility].  Discussed with patient via MyChart.  Will request provider preference on referral.   Loni Dolly, DNP [facility] 05/28/2020  1:46 PM

## 2020-06-02 ENCOUNTER — Other Ambulatory Visit: Payer: Self-pay | Admitting: Urology

## 2020-06-02 ENCOUNTER — Telehealth: Payer: Self-pay | Admitting: Cardiovascular Disease

## 2020-06-02 DIAGNOSIS — N200 Calculus of kidney: Secondary | ICD-10-CM

## 2020-06-02 NOTE — Telephone Encounter (Signed)
   Primary Cardiologist: Nicki Guadalajara, MD  Chart reviewed as part of pre-operative protocol coverage. Because of Divya Munshi Pullman's past medical history and time since last visit, she will require a follow-up visit in order to better assess preoperative cardiovascular risk.  Pre-op covering staff: - Patient already has an upcoming appointment 06/17/20 with Dr. Ladona Ridgel, please add "pre-op clearance" to the appointment notes so provider is aware. - Please contact requesting surgeon's office via preferred method (i.e, phone, fax) to inform them of need for appointment prior to surgery.  If applicable, this message will also be routed to pharmacy pool and/or primary cardiologist for input on holding anticoagulant/antiplatelet agent as requested below so that this information is available to the clearing provider at time of patient's appointment.   Georgie Chard, NP  06/02/2020, 9:44 AM

## 2020-06-02 NOTE — Telephone Encounter (Signed)
   [facility] Pre-operative Risk Assessment    Request for surgical clearance:  1. What type of surgery is being performed? Shock Wave Lithotripsy   2. When is this surgery scheduled? TBD   3. What type of clearance is required (medical clearance vs. Pharmacy clearance to hold med vs. Both)? Both   4. Are there any medications that need to be held prior to surgery and how long? Aspirin, 3 days prior  5. Practice name and name of physician performing surgery? [facility]  Dr. Jacalyn Lefevre   6. What is your office phone number? [phone] (ext: [phone])    7.   What is your office fax number? [phone]  8.   Anesthesia type (None, local, MAC, general) ? MAC   [name] 06/02/2020, 9:05 AM  _________________________________________________________________

## 2020-06-02 NOTE — Telephone Encounter (Signed)
Appt notes for Dr. Ladona Ridgel 06/19/20 have been updated and reflect pre op clearance needed. I will forward notes to MD for upcoming appt. Will send FYI to requesting office pt has appt 06/19/20.

## 2020-06-04 ENCOUNTER — Ambulatory Visit (INDEPENDENT_AMBULATORY_CARE_PROVIDER_SITE_OTHER): Payer: BC Managed Care – PPO | Admitting: Student

## 2020-06-04 ENCOUNTER — Other Ambulatory Visit: Payer: Self-pay

## 2020-06-04 ENCOUNTER — Encounter: Payer: Self-pay | Admitting: Student

## 2020-06-04 VITALS — BP 130/68 | HR 75 | Ht 67.0 in | Wt 177.4 lb

## 2020-06-04 DIAGNOSIS — I5022 Chronic systolic (congestive) heart failure: Secondary | ICD-10-CM | POA: Diagnosis not present

## 2020-06-04 DIAGNOSIS — G4733 Obstructive sleep apnea (adult) (pediatric): Secondary | ICD-10-CM | POA: Diagnosis not present

## 2020-06-04 DIAGNOSIS — I428 Other cardiomyopathies: Secondary | ICD-10-CM

## 2020-06-04 DIAGNOSIS — I10 Essential (primary) hypertension: Secondary | ICD-10-CM | POA: Diagnosis not present

## 2020-06-04 DIAGNOSIS — Z9989 Dependence on other enabling machines and devices: Secondary | ICD-10-CM

## 2020-06-04 DIAGNOSIS — N200 Calculus of kidney: Secondary | ICD-10-CM

## 2020-06-04 LAB — CUP PACEART INCLINIC DEVICE CHECK
Battery Remaining Longevity: 68 mo
Battery Voltage: 2.92 V
Brady Statistic RA Percent Paced: 15 %
Brady Statistic RV Percent Paced: 99.87 %
Date Time Interrogation Session: 20220330085224
Implantable Lead Implant Date: 20091016
Implantable Lead Implant Date: 20091016
Implantable Lead Implant Date: 20091016
Implantable Lead Location: 753858
Implantable Lead Location: 753859
Implantable Lead Location: 753860
Implantable Lead Model: 4196
Implantable Lead Model: 7122
Implantable Pulse Generator Implant Date: 20151111
Lead Channel Impedance Value: 375 Ohm
Lead Channel Impedance Value: 512.5 Ohm
Lead Channel Impedance Value: 775 Ohm
Lead Channel Pacing Threshold Amplitude: 0.25 V
Lead Channel Pacing Threshold Amplitude: 0.25 V
Lead Channel Pacing Threshold Amplitude: 1 V
Lead Channel Pacing Threshold Amplitude: 1 V
Lead Channel Pacing Threshold Amplitude: 1.25 V
Lead Channel Pacing Threshold Amplitude: 1.25 V
Lead Channel Pacing Threshold Pulse Width: 0.3 ms
Lead Channel Pacing Threshold Pulse Width: 0.3 ms
Lead Channel Pacing Threshold Pulse Width: 0.3 ms
Lead Channel Pacing Threshold Pulse Width: 0.3 ms
Lead Channel Pacing Threshold Pulse Width: 0.6 ms
Lead Channel Pacing Threshold Pulse Width: 0.6 ms
Lead Channel Sensing Intrinsic Amplitude: 3.5 mV
Lead Channel Sensing Intrinsic Amplitude: 8.3 mV
Lead Channel Setting Pacing Amplitude: 2 V
Lead Channel Setting Pacing Amplitude: 2.5 V
Lead Channel Setting Pacing Amplitude: 2.5 V
Lead Channel Setting Pacing Pulse Width: 0.3 ms
Lead Channel Setting Pacing Pulse Width: 0.6 ms
Lead Channel Setting Sensing Sensitivity: 2 mV
Pulse Gen Model: 3222
Pulse Gen Serial Number: 7688750

## 2020-06-04 NOTE — Progress Notes (Signed)
Electrophysiology Office Note Date: 06/04/2020  ID:  [name], [name] [birth year], MRN [id number]  PCP: Lucky Cowboy, MD Primary Cardiologist: Nicki Guadalajara, MD Electrophysiologist: Lewayne Bunting, MD   CC: Pacemaker follow-up  [name] is a 65 y.o. female seen today for Lewayne Bunting, MD for cardiac clearance for extracorporeal shock wave lithotripsy for kidney stones.  Since last being seen in our clinic the patient reports doing very well. She struggles with kidney stones and has a large retained one and is pending lithotripsy.  she denies chest pain, palpitations, dyspnea, PND, orthopnea, nausea, vomiting, dizziness, syncope, edema, weight gain, or early satiety.  Device History: St. Jude BiV PPM implanted , gen change/downgrade to CRT-P 2015 for NICM, LBBB (Downgraded when EF improved)  Past Medical History:  Diagnosis Date  . Automatic implantable cardiac defibrillator in situ    ST. JUDE MODEL 7122  . CHF (congestive heart failure) (HCC)   . Diabetes mellitus   . Hyperlipidemia   . Hypertension 06/25/2011   Renal dopplers - superior mesenteric artery >50% diameter reduction; R renal artery - normal patency; L proximal renal artery 1-59% reduction (lower end of scale); both kidneys normal in size/symmetry with normal cortex and medulla  . Leg pain 12/09/2010   doppler of R femoral artery - no evidence of dissection, AV fistula, pseudoaneurysm or other vascular abnormalities  . Migraines   . Obesity   . Sleep apnea    on CPAP - AHI during sleep study was 44  . Type II or unspecified type diabetes mellitus without mention of complication, not stated as uncontrolled   . Type II or unspecified type diabetes mellitus without mention of complication, not stated as uncontrolled   . Unspecified sleep apnea    Past Surgical History:  Procedure Laterality Date  . ABDOMINAL HYSTERECTOMY     total -   . BIV PACEMAKER GENERATOR CHANGE OUT N/A 01/16/2014   Procedure: BIV  PACEMAKER GENERATOR CHANGE OUT;  Surgeon: Marinus Maw, MD;  Location: [facility] CATH LAB;  Service: Cardiovascular;  Laterality: N/A;  . CARDIAC CATHETERIZATION  07/15/2006   no significant CAD by cardiac cath, confirmed by intravascular [place] of LAD; non-ischemic cardiomyopathy prob related to uncontrolled hypertension, DM and morbid obesity; moderate pulmonary hypertension; preserved cardiac output and cardiac index  . CARDIAC DEFIBRILLATOR PLACEMENT     ICD by Dr. Ladona Ridgel  . CARDIOVASCULAR STRESS TEST  08/28/2010   R/P MV - pattern of normal perfusion in all regions, no scintigraphic evidence of inducible myocardial ischemia; no EKG changes; normal perfusion study; pt did experience chesst pain during strudy, resolved spontaneously  . DOPPLER ECHOCARDIOGRAPHY  06/25/2011   EF >55%; moderate concentric LV hypertrophy; LA mildly dilated, mild mitral annular calcification;   . KNEE SURGERY Right    arthroscopic  . NECK SURGERY    . PACEMAKER INSERTION      Current Outpatient Medications  Medication Sig Dispense Refill  . aspirin 81 MG tablet Take 162 mg by mouth at bedtime.     Marland Kitchen aspirin-acetaminophen-caffeine (EXCEDRIN MIGRAINE) 250-250-65 MG per tablet Take 2 tablets by mouth daily as needed for migraine.    . carvedilol (COREG) 12.5 MG tablet Take 1 tablet (12.5 mg total) by mouth 2 (two) times daily. 180 tablet 3  . diazepam (VALIUM) 5 MG tablet Take 1/2 to 1 tablet at Bedtime ONLY if needed 30 tablet 0  . estradiol (ESTRACE) 1 MG tablet Take 1.5 tablets by mouth daily.  4  .  fenofibrate micronized (LOFIBRA) 134 MG capsule Take 1 capsule (134 mg total) by mouth daily before breakfast. 60 capsule 1  . imipramine (TOFRANIL) 25 MG tablet TAKE 1 TO 2 TABLETS BY MOUTH AT BEDTIME AS NEEDED FOR SLEEP 180 tablet 0  . ketorolac (TORADOL) 10 MG tablet Take 1 tablet (10 mg total) by mouth every 6 (six) hours as needed. 30 tablet 0  . loratadine (CLARITIN) 10 MG tablet Take 10 mg by mouth daily.    Marland Kitchen losartan  (COZAAR) 25 MG tablet Take 1 tablet (25 mg total) by mouth daily. 60 tablet 1  . meloxicam (MOBIC) 15 MG tablet Take one daily with food for 2 weeks, can take with tylenol, can not take with aleve, iburpofen, then as needed daily for pain 30 tablet 0  . phentermine (ADIPEX-P) 37.5 MG tablet Take 1/2-1 tab daily PRN for appetite and weight loss. 30 tablet 2  . Potassium 99 MG TABS Take 0.5 tablets (49.5 mg total) by mouth daily. 330 tablet   . rizatriptan (MAXALT) 10 MG tablet Take 1 tablet for severe Migraine & May repeat in 2 hours if needed (Max 2 tabs /24 hours) 10 tablet 0  . rosuvastatin (CRESTOR) 40 MG tablet TAKE 1 TABLET(40 MG) BY MOUTH DAILY. 60 tablet 1  . spironolactone (ALDACTONE) 25 MG tablet Take 1 tablet (25 mg total) by mouth 2 (two) times daily. 180 tablet 3  . tamsulosin (FLOMAX) 0.4 MG CAPS capsule Take 1 capsule (0.4 mg total) by mouth daily. 30 capsule 0  . valACYclovir (VALTREX) 500 MG tablet TAKE 1 TABLET(500 MG) BY MOUTH DAILY 90 tablet 0  . verapamil (CALAN-SR) 240 MG CR tablet TAKE 1 TABLET(240 MG) BY MOUTH AT BEDTIME 60 tablet 1  . VITAMIN D PO Take 2,000 Units by mouth.     No current facility-administered medications for this visit.    Allergies:   Flagyl [metronidazole], Niacin, Niacin and related, Penicillins, and Yeast-related products   Social History: Social History   Socioeconomic History  . Marital status: Married    Spouse name: Not on file  . Number of children: Not on file  . Years of education: Not on file  . Highest education level: Not on file  Occupational History  . Not on file  Tobacco Use  . Smoking status: Former Smoker    Types: Cigarettes    Quit date: 03/09/1975    Years since quitting: 45.2  . Smokeless tobacco: Never Used  . Tobacco comment: light smoker for a few months as a teen   Vaping Use  . Vaping Use: Never used  Substance and Sexual Activity  . Alcohol use: Not Currently  . Drug use: No  . Sexual activity: Yes     Partners: Male    Birth control/protection: Post-menopausal  Other Topics Concern  . Not on file  Social History Narrative   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   Social Determinants of Health   Financial Resource Strain: Not on file  Food Insecurity: Not on file  Transportation Needs: Not on file  Physical Activity: Not on file  Stress: Not on file  Social Connections: Not on file  Intimate Partner Violence: Not on file    Family History: Family History  Problem Relation Age of Onset  . Heart attack Father   . Hearing loss Father   . Hypertension Father   . Cancer - Cervical Mother        precancerous cells  . Colon polyps Mother   .  Colon cancer Maternal Uncle   . Colon cancer Maternal Grandfather 75     Review of Systems: All other systems reviewed and are otherwise negative except as noted above.  Physical Exam: Vitals:   06/04/20 0824  BP: 130/68  Pulse: 75  SpO2: 99%  Weight: 177 lb 6.4 oz (80.5 kg)  Height: 5\' 7"  (1.702 m)     GEN- The patient is well appearing, alert and oriented x 3 today.   HEENT: normocephalic, atraumatic; sclera clear, conjunctiva pink; hearing intact; oropharynx clear; neck supple  Lungs- Clear to ausculation bilaterally, normal work of breathing.  No wheezes, rales, rhonchi Heart- Regular rate and rhythm, no murmurs, rubs or gallops  GI- soft, non-tender, non-distended, bowel sounds present  Extremities- no clubbing or cyanosis. No edema MS- no significant deformity or atrophy Skin- warm and dry, no rash or lesion; PPM pocket well healed Psych- euthymic mood, full affect Neuro- strength and sensation are intact  PPM Interrogation- reviewed in detail today,  See PACEART report  EKG:  EKG is ordered today. The ekg ordered today shows BiV pacing at 69 bpm, QRS 140 ms. Initial downward deflection in Lead 1, but mostly positive, ? Small isoelectric portion prior to downward deflection in V1 with LBBB pattern.  Recent  Labs: 01/29/2020: Magnesium 1.8; TSH 1.48 05/27/2020: ALT 16; BUN 12; Creat 0.84; Hemoglobin 12.9; Platelets 259; Potassium 4.0; Sodium 137   Wt Readings from Last 3 Encounters:  06/04/20 177 lb 6.4 oz (80.5 kg)  05/27/20 181 lb (82.1 kg)  04/30/20 176 lb (79.8 kg)     Other studies Reviewed: Additional studies/ records that were reviewed today include: Previous EP office notes, Previous remote checks, Most recent labwork.   Assessment and Plan:   1. NICM and LBBB s/p St. Jude BiV PPM  Normal PPM function See Pace Art report No changes today  2. Heart failure with normalized ejection fraction Continue current medications Echo 03/07/2018 LVEF 50-55%  3. Cardiac Clearance for Shockwave Lithotripsy Pt is at a low risk of perioperative complications from a cardiac perspective by the Revised Cardiac Risk Index 03/09/2018 Criteria) She is clear to proceed without further cardiac work up. Reviewed with st jude and her device should not be affected by extracorpeal lithotripsy.   Current medicines are reviewed at length with the patient today.   The patient does not have concerns regarding her medicines.  The following changes were made today:  none  Labs/ tests ordered today include:  Orders Placed This Encounter  Procedures  . EKG 12-Lead    Disposition:   Follow up with Dr. [name] in 6 Months   Signed, Ladona Ridgel  06/04/2020 8:49 AM  [facility] [address] [place] [phone] (office) [phone] (fax)

## 2020-06-04 NOTE — Patient Instructions (Signed)
Medication Instructions:  Your physician recommends that you continue on your current medications as directed. Please refer to the Current Medication list given to you today.  *If you need a refill on your cardiac medications before your next appointment, please call your pharmacy*   Lab Work: None If you have labs (blood work) drawn today and your tests are completely normal, you will receive your results only by: . MyChart Message (if you have MyChart) OR . A paper copy in the mail If you have any lab test that is abnormal or we need to change your treatment, we will call you to review the results.  Follow-Up: At [facility], you and your health needs are our priority.  As part of our continuing mission to provide you with exceptional heart care, we have created designated Provider Care Teams.  These Care Teams include your primary Cardiologist (physician) and Advanced Practice Providers (APPs -  Physician Assistants and Nurse Practitioners) who all work together to provide you with the care you need, when you need it.  Your next appointment:   6 month(s)  The format for your next appointment:   In Person  Provider:   You may see Gregg Taylor, MD or one of the following Advanced Practice Providers on your designated Care Team:    Amber Seiler, NP  Renee Ursuy, PA-C  Michael "Andy" Tillery, PA-C    

## 2020-06-05 ENCOUNTER — Other Ambulatory Visit (HOSPITAL_COMMUNITY)
Admission: RE | Admit: 2020-06-05 | Discharge: 2020-06-05 | Disposition: A | Discharge status: Home / Self Care | Payer: BC Managed Care – PPO | Source: Ambulatory Visit | Attending: Urology | Admitting: Urology

## 2020-06-05 DIAGNOSIS — Z20822 Contact with and (suspected) exposure to covid-19: Secondary | ICD-10-CM | POA: Diagnosis not present

## 2020-06-05 DIAGNOSIS — Z01812 Encounter for preprocedural laboratory examination: Secondary | ICD-10-CM | POA: Insufficient documentation

## 2020-06-05 LAB — SARS CORONAVIRUS 2 (TAT 6-24 HRS): SARS Coronavirus 2: NEGATIVE

## 2020-06-05 NOTE — Progress Notes (Signed)
Patient to arrive at 0915 on 06/09/20. History and medications reviewed. Pre-procedure instructions given. NPO after MN on Sunday except for clear liquids until 0715. BP medications with sip of water AM of procedure. Clinical biochemist.

## 2020-06-05 NOTE — Progress Notes (Signed)
Left message for patient to return call for ESWL instructions. 

## 2020-06-08 NOTE — Anesthesia Preprocedure Evaluation (Addendum)
Anesthesia Evaluation    Reviewed: Allergy & Precautions, H&P , Patient's Chart, lab work & pertinent test results  Airway Mallampati: I  TM Distance: >3 FB Neck ROM: Full    Dental no notable dental hx. (+) Teeth Intact, Dental Advisory Given   Pulmonary sleep apnea and Continuous Positive Airway Pressure Ventilation , former smoker,    Pulmonary exam normal breath sounds clear to auscultation       Cardiovascular Exercise Tolerance: Good hypertension, Pt. on medications +CHF  Normal cardiovascular exam+ pacemaker  Rhythm:Regular Rate:Normal  ECHO 12/19 Left ventricle: The cavity size was normal. Systolic function was  normal. The estimated ejection fraction was in the range of 50%  to 55%. Wall motion was normal; there were no regional wall  motion abnormalities. Doppler parameters are consistent with  abnormal left ventricular relaxation (grade 1 diastolic  dysfunction).    Neuro/Psych  Headaches, PSYCHIATRIC DISORDERS Anxiety    GI/Hepatic Neg liver ROS, GERD  ,  Endo/Other  diabetes, Type 2  Renal/GU Renal disease  negative genitourinary   Musculoskeletal   Abdominal   Peds  Hematology negative hematology ROS (+)   Anesthesia Other Findings No longer has AICD.  I spoke with St. Jude medical device and they reassure me that no action is required for procedure.   Reproductive/Obstetrics negative OB ROS                           Anesthesia Physical Anesthesia Plan  ASA: III  Anesthesia Plan: MAC   Post-op Pain Management:    Induction: Intravenous  PONV Risk Score and Plan:   Airway Management Planned: Mask, Natural Airway and Nasal Cannula  Additional Equipment: None  Intra-op Plan:   Post-operative Plan:   Informed Consent: I have reviewed the patients History and Physical, chart, labs and discussed the procedure including the risks, benefits and alternatives for  the proposed anesthesia with the patient or authorized representative who has indicated his/her understanding and acceptance.       Plan Discussed with: Anesthesiologist and CRNA  Anesthesia Plan Comments:         Anesthesia Quick Evaluation

## 2020-06-09 ENCOUNTER — Ambulatory Visit (HOSPITAL_BASED_OUTPATIENT_CLINIC_OR_DEPARTMENT_OTHER): Payer: BC Managed Care – PPO | Admitting: Anesthesiology

## 2020-06-09 ENCOUNTER — Ambulatory Visit (HOSPITAL_COMMUNITY): Payer: BC Managed Care – PPO

## 2020-06-09 ENCOUNTER — Other Ambulatory Visit: Payer: Self-pay

## 2020-06-09 ENCOUNTER — Encounter (HOSPITAL_BASED_OUTPATIENT_CLINIC_OR_DEPARTMENT_OTHER): Payer: Self-pay | Admitting: Urology

## 2020-06-09 ENCOUNTER — Ambulatory Visit (HOSPITAL_BASED_OUTPATIENT_CLINIC_OR_DEPARTMENT_OTHER)
Admission: RE | Admit: 2020-06-09 | Discharge: 2020-06-09 | Disposition: A | Discharge status: Home / Self Care | Payer: BC Managed Care – PPO | Attending: Urology | Admitting: Urology

## 2020-06-09 ENCOUNTER — Encounter (HOSPITAL_BASED_OUTPATIENT_CLINIC_OR_DEPARTMENT_OTHER)
Admission: RE | Disposition: A | Discharge status: Home / Self Care | Payer: Self-pay | Source: Home / Self Care | Attending: Urology

## 2020-06-09 DIAGNOSIS — Z87891 Personal history of nicotine dependence: Secondary | ICD-10-CM | POA: Insufficient documentation

## 2020-06-09 DIAGNOSIS — Z8371 Family history of colonic polyps: Secondary | ICD-10-CM | POA: Insufficient documentation

## 2020-06-09 DIAGNOSIS — Z881 Allergy status to other antibiotic agents status: Secondary | ICD-10-CM | POA: Insufficient documentation

## 2020-06-09 DIAGNOSIS — Z880 Allergy status to penicillin: Secondary | ICD-10-CM | POA: Insufficient documentation

## 2020-06-09 DIAGNOSIS — Z8249 Family history of ischemic heart disease and other diseases of the circulatory system: Secondary | ICD-10-CM | POA: Diagnosis not present

## 2020-06-09 DIAGNOSIS — N200 Calculus of kidney: Secondary | ICD-10-CM | POA: Diagnosis present

## 2020-06-09 DIAGNOSIS — Z95810 Presence of automatic (implantable) cardiac defibrillator: Secondary | ICD-10-CM | POA: Diagnosis not present

## 2020-06-09 DIAGNOSIS — Z888 Allergy status to other drugs, medicaments and biological substances status: Secondary | ICD-10-CM | POA: Insufficient documentation

## 2020-06-09 DIAGNOSIS — Z800 Family history of malignant neoplasm of digestive organs: Secondary | ICD-10-CM | POA: Diagnosis not present

## 2020-06-09 DIAGNOSIS — Z822 Family history of deafness and hearing loss: Secondary | ICD-10-CM | POA: Insufficient documentation

## 2020-06-09 HISTORY — DX: Other specified postprocedural states: Z98.890

## 2020-06-09 HISTORY — DX: Other specified postprocedural states: R11.2

## 2020-06-09 HISTORY — PX: EXTRACORPOREAL SHOCK WAVE LITHOTRIPSY: SHX1557

## 2020-06-09 SURGERY — LITHOTRIPSY, ESWL
Anesthesia: Monitor Anesthesia Care | Laterality: Left

## 2020-06-09 MED ORDER — DIAZEPAM 5 MG PO TABS
10.0000 mg | ORAL_TABLET | ORAL | Status: AC
  Administered 2020-06-09: 10 mg via ORAL

## 2020-06-09 MED ORDER — MIDAZOLAM HCL 2 MG/2ML IJ SOLN
INTRAMUSCULAR | Status: AC
  Filled 2020-06-09: qty 4

## 2020-06-09 MED ORDER — FENTANYL CITRATE (PF) 100 MCG/2ML IJ SOLN
INTRAMUSCULAR | Status: AC
  Filled 2020-06-09: qty 4

## 2020-06-09 MED ORDER — CIPROFLOXACIN HCL 500 MG PO TABS
ORAL_TABLET | ORAL | Status: AC
  Filled 2020-06-09: qty 1

## 2020-06-09 MED ORDER — SODIUM CHLORIDE 0.9 % IV SOLN: INTRAVENOUS | Status: DC

## 2020-06-09 MED ORDER — LIDOCAINE 2% (20 MG/ML) 5 ML SYRINGE
INTRAMUSCULAR | Status: AC
  Filled 2020-06-09: qty 5

## 2020-06-09 MED ORDER — CIPROFLOXACIN HCL 500 MG PO TABS
500.0000 mg | ORAL_TABLET | ORAL | Status: AC
  Administered 2020-06-09: 500 mg via ORAL

## 2020-06-09 MED ORDER — PROPOFOL 500 MG/50ML IV EMUL
INTRAVENOUS | Status: AC
  Filled 2020-06-09: qty 100

## 2020-06-09 MED ORDER — FENTANYL CITRATE (PF) 100 MCG/2ML IJ SOLN
INTRAMUSCULAR | Status: DC | PRN
  Administered 2020-06-09 (×2): 25 ug via INTRAVENOUS
  Administered 2020-06-09: 50 ug via INTRAVENOUS

## 2020-06-09 MED ORDER — PROPOFOL 500 MG/50ML IV EMUL
INTRAVENOUS | Status: DC | PRN
  Administered 2020-06-09: 50 ug/kg/min via INTRAVENOUS

## 2020-06-09 MED ORDER — DIAZEPAM 5 MG PO TABS
ORAL_TABLET | ORAL | Status: AC
  Filled 2020-06-09: qty 2

## 2020-06-09 MED ORDER — MIDAZOLAM HCL 5 MG/5ML IJ SOLN
INTRAMUSCULAR | Status: DC | PRN
  Administered 2020-06-09: 2 mg via INTRAVENOUS

## 2020-06-09 MED ORDER — DIPHENHYDRAMINE HCL 25 MG PO CAPS
25.0000 mg | ORAL_CAPSULE | ORAL | Status: AC
  Administered 2020-06-09: 25 mg via ORAL

## 2020-06-09 MED ORDER — DIPHENHYDRAMINE HCL 25 MG PO CAPS
ORAL_CAPSULE | ORAL | Status: AC
  Filled 2020-06-09: qty 1

## 2020-06-09 NOTE — H&P (Signed)
Urology Preoperative H&P   Chief Complaint: Left renal stone  History of Present Illness: [name] is a 65 y.o. female with left renal stone here for L ESWL. Denies fevers, chills, dysuria.  Past Medical History:  Diagnosis Date  . Automatic implantable cardiac defibrillator in situ    ST. JUDE MODEL 7122  . CHF (congestive heart failure) (HCC)   . Diabetes mellitus   . Hyperlipidemia   . Hypertension 06/25/2011   Renal dopplers - superior mesenteric artery >50% diameter reduction; R renal artery - normal patency; L proximal renal artery 1-59% reduction (lower end of scale); both kidneys normal in size/symmetry with normal cortex and medulla  . Leg pain 12/09/2010   doppler of R femoral artery - no evidence of dissection, AV fistula, pseudoaneurysm or other vascular abnormalities  . Migraines   . Obesity   . PONV (postoperative nausea and vomiting)   . Sleep apnea    on CPAP - AHI during sleep study was 44  . Type II or unspecified type diabetes mellitus without mention of complication, not stated as uncontrolled   . Type II or unspecified type diabetes mellitus without mention of complication, not stated as uncontrolled   . Unspecified sleep apnea     Past Surgical History:  Procedure Laterality Date  . ABDOMINAL HYSTERECTOMY     total -   . BIV PACEMAKER GENERATOR CHANGE OUT N/A 01/16/2014   Procedure: BIV PACEMAKER GENERATOR CHANGE OUT;  Surgeon: Marinus Maw, MD;  Location: [facility] CATH LAB;  Service: Cardiovascular;  Laterality: N/A;  . CARDIAC CATHETERIZATION  07/15/2006   no significant CAD by cardiac cath, confirmed by intravascular [place] of LAD; non-ischemic cardiomyopathy prob related to uncontrolled hypertension, DM and morbid obesity; moderate pulmonary hypertension; preserved cardiac output and cardiac index  . CARDIAC DEFIBRILLATOR PLACEMENT     ICD by Dr. Ladona Ridgel  . CARDIOVASCULAR STRESS TEST  08/28/2010   R/P MV - pattern of normal perfusion in all regions, no  scintigraphic evidence of inducible myocardial ischemia; no EKG changes; normal perfusion study; pt did experience chesst pain during strudy, resolved spontaneously  . DOPPLER ECHOCARDIOGRAPHY  06/25/2011   EF >55%; moderate concentric LV hypertrophy; LA mildly dilated, mild mitral annular calcification;   . KNEE SURGERY Right    arthroscopic  . NECK SURGERY    . PACEMAKER INSERTION      Allergies:  Allergies  Allergen Reactions  . Flagyl [Metronidazole] Anaphylaxis  . Niacin Other (See Comments)  . Niacin And Related Other (See Comments) and Cough    flushing  . Penicillins Swelling and Rash    Has patient had a PCN reaction causing immediate rash, facial/tongue/throat swelling, SOB or lightheadedness with hypotension: No Has patient had a PCN reaction causing severe rash involving mucus membranes or skin necrosis: No Has patient had a PCN reaction that required hospitalization No Has patient had a PCN reaction occurring within the last 10 years: Yes 3-4 years ago If all of the above answers are "NO", then may proceed with Cephalosporin use.  Marland Kitchen Yeast-Related Products Swelling and Rash    Family History  Problem Relation Age of Onset  . Heart attack Father   . Hearing loss Father   . Hypertension Father   . Cancer - Cervical Mother        precancerous cells  . Colon polyps Mother   . Colon cancer Maternal Uncle   . Colon cancer Maternal Grandfather 40    Social History:  reports that  she quit smoking about 45 years ago. Her smoking use included cigarettes. She has never used smokeless tobacco. She reports previous alcohol use. She reports that she does not use drugs.  ROS: A complete review of systems was performed.  All systems are negative except for pertinent findings as noted.  Physical Exam:  Vital signs in last 24 hours: Temp:  [97.2 F (36.2 C)] 97.2 F (36.2 C) (04/04 0950) Pulse Rate:  [64] 64 (04/04 0950) Resp:  [15] 15 (04/04 0950) BP: (123)/(68) 123/68  (04/04 0950) SpO2:  [100 %] 100 % (04/04 0950) Weight:  [79.4 kg] 79.4 kg (04/04 0950) Constitutional:  Alert and oriented, No acute distress Cardiovascular: Regular rate and rhythm Respiratory: Normal respiratory effort, Lungs clear bilaterally GI: Abdomen is soft, nontender, nondistended, no abdominal masses GU: No CVA tenderness Lymphatic: No lymphadenopathy Neurologic: Grossly intact, no focal deficits Psychiatric: Normal mood and affect  Laboratory Data:  No results for input(s): WBC, HGB, HCT, PLT in the last 72 hours.  No results for input(s): NA, K, CL, GLUCOSE, BUN, CALCIUM, CREATININE in the last 72 hours.  Invalid input(s): CO3   No results found for this or any previous visit (from the past 24 hour(s)). Recent Results (from the past 240 hour(s))  SARS CORONAVIRUS 2 (TAT 6-24 HRS) Nasopharyngeal Nasopharyngeal Swab     Status: None   Collection Time: 06/05/20  1:42 PM   Specimen: Nasopharyngeal Swab  Result Value Ref Range Status   SARS Coronavirus 2 NEGATIVE NEGATIVE Final    Comment: (NOTE) SARS-CoV-2 target nucleic acids are NOT DETECTED.  The SARS-CoV-2 RNA is generally detectable in upper and lower respiratory specimens during the acute phase of infection. Negative results do not preclude SARS-CoV-2 infection, do not rule out co-infections with other pathogens, and should not be used as the sole basis for treatment or other patient management decisions. Negative results must be combined with clinical observations, patient history, and epidemiological information. The expected result is Negative.  Fact Sheet for Patients: HairSlick.no  Fact Sheet for Healthcare Providers: quierodirigir.com  This test is not yet approved or cleared by the [place] FDA and  has been authorized for detection and/or diagnosis of SARS-CoV-2 by FDA under an Emergency Use Authorization (EUA). This EUA will remain  in  effect (meaning this test can be used) for the duration of the COVID-19 declaration under Se ction 564(b)(1) of the Act, 21 U.S.C. section 360bbb-3(b)(1), unless the authorization is terminated or revoked sooner.  Performed at [facility], [address] [place], [place] [address]     Renal Function: No results for input(s): CREATININE in the last 168 hours. Estimated Creatinine Clearance: 73.4 mL/min (by C-G formula based on SCr of 0.84 mg/dL).  Radiologic Imaging: No results found.  I independently reviewed the above imaging studies.  Assessment and Plan [name] is a 65 y.o. female with left renal stone here for left ESWL.   The risks, benefits and alternatives of left ESWL was discussed with the patient. I described the risks which include arrhythmia, kidney contusion, kidney hemorrhage, need for transfusion, back discomfort, flank ecchymosis, flank abrasion, inability to fracture the stone, inability to pass stone fragments, Steinstrasse, infection associated with obstructing stones, need for an alternative surgical procedure and possible need for repeat shockwave lithotripsy.  The patient voices understanding and wishes to proceed.     Matt R. Claritza July MD 06/09/2020, 10:50 AM  [facility] Pager: [phone]): [phone]

## 2020-06-09 NOTE — Transfer of Care (Signed)
Immediate Anesthesia Transfer of Care Note  Patient: [name]  Procedure(s) Performed: EXTRACORPOREAL SHOCK WAVE LITHOTRIPSY (ESWL) (Left )  Patient Location: PACU and Short Stay  Anesthesia Type:MAC  Level of Consciousness: awake, alert  and oriented  Airway & Oxygen Therapy: Patient Spontanous Breathing  Post-op Assessment: Report given to RN  Post vital signs: Reviewed and stable  Last Vitals:  Vitals Value Taken Time  BP 111/70,    Temp 97.6   Pulse 59   Resp 16   SpO2 100%     Last Pain:  Vitals:   06/09/20 0950  TempSrc: Oral  PainSc: 0-No pain         Complications: No complications documented.

## 2020-06-09 NOTE — Op Note (Signed)
ESWL Operative Note  Treating Physician: Tyrees Chopin, MD  Pre-op diagnosis: Left renal stone  Post-op diagnosis: Same   Procedure: Left ESWL  See [facility] OP note scanned into chart. Also because of the size, density, location and other factors that cannot be anticipated I feel this will likely be a staged procedure. This fact supersedes any indication in the scanned [facility] operative note to the contrary.  Matt R. Loye Reininger MD [facility]  Pager: [phone]   

## 2020-06-09 NOTE — Discharge Instructions (Signed)
Lithotripsy, Care After This sheet gives you information about how to care for yourself after your procedure. Your health care provider may also give you more specific instructions. If you have problems or questions, contact your health care provider. What can I expect after the procedure? After the procedure, it is common to have: Some blood in your urine. This should only last for a few days. Soreness in your back, sides, or upper abdomen for a few days. Blotches or bruises on the area where the shock wave entered the skin. Pain, discomfort, or nausea when pieces (fragments) of the kidney stone move through the tube that carries urine from the kidney to the bladder (ureter). Stone fragments may pass soon after the procedure, but they may continue to pass for up to 4-8 weeks. If you have severe pain or nausea, contact your health care provider. This may be caused by a large stone that was not broken up, and this may mean that you need more treatment. Some pain or discomfort during urination. Some pain or discomfort in the lower abdomen or (in men) at the base of the penis. Follow these instructions at home: Medicines Take over-the-counter and prescription medicines only as told by your health care provider. If you were prescribed an antibiotic medicine, take it as told by your health care provider. Do not stop taking the antibiotic even if you start to feel better. Ask your health care provider if the medicine prescribed to you requires you to avoid driving or using machinery. Eating and drinking Drink enough fluid to keep your urine pale yellow. This helps any remaining pieces of the stone to pass. It can also help prevent new stones from forming. Eat plenty of fresh fruits and vegetables. Follow instructions from your health care provider about eating or drinking restrictions. You may be instructed to: Reduce how much salt (sodium) you eat or drink. Check ingredients and nutrition facts on  packaged foods and beverages to see how much sodium they contain. Reduce how much meat you eat. Eat the recommended amount of calcium for your age and gender. Ask your health care provider how much calcium you should have.      General instructions Get plenty of rest. Return to your normal activities as told by your health care provider. Ask your health care provider what activities are safe for you. Most people can resume normal activities 1-2 days after the procedure. If you were given a sedative during the procedure, it can affect you for several hours. Do not drive or operate machinery until your health care provider says that it is safe. Your health care provider may direct you to lie in a certain position (postural drainage) and tap firmly (percuss) over your kidney area to help stone fragments pass. Follow instructions as told by your health care provider. If directed, strain all urine through the strainer that was provided by your health care provider. Keep all fragments for your health care provider to see. Any stones that are found may be sent to a medical lab for examination. The stone may be as small as a grain of salt. Keep all follow-up visits as told by your health care provider. This is important. Contact a health care provider if: You have a fever or chills. You have nausea that is severe or does not go away. You have any of these urinary symptoms: Blood in your urine for longer than your health care provider told you to expect. Urine that smells bad or unusual.  Feeling a strong urge to urinate after emptying your bladder. Pain or burning with urination that does not go away. Urinating more often than usual and this does not go away. You have a stent and it comes out. Get help right away if: You have severe pain in your back, sides, or upper abdomen. You have any of these urinary symptoms: Severe pain while urinating. More blood in your urine or having blood in your urine  when you did not before. Passing blood clots in your urine. Passing only a small amount of urine or being unable to pass any urine at all. You have severe nausea that leads to persistent vomiting. You faint. Summary After this procedure, it is common to have some pain, discomfort, or nausea when pieces (fragments) of the kidney stone move through the tube that carries urine from the kidney to the bladder (ureter). If this pain or nausea is severe, however, you should contact your health care provider. Return to your normal activities as told by your health care provider. Ask your health care provider what activities are safe for you. Drink enough fluid to keep your urine pale yellow. This helps any remaining pieces of the stone to pass, and it can help prevent new stones from forming. If directed, strain your urine and keep all fragments for your health care provider to see. Fragments or stones may be as small as a grain of salt. Get help right away if you have severe pain in your back, sides, or upper abdomen, or if you have severe pain while urinating. This information is not intended to replace advice given to you by your health care provider. Make sure you discuss any questions you have with your health care provider. Document Revised: 12/06/2018 Document Reviewed: 12/06/2018 Elsevier Patient Education  2021 [employer]  Activity:  You are encouraged to ambulate frequently (about every hour during waking hours) to help prevent blood clots from forming in your legs or lungs.     Diet: You should advance your diet as instructed by your physician.  It will be normal to have some bloating, nausea, and abdominal discomfort intermittently.   Prescriptions:  You will be provided a prescription for pain medication to take as needed.  If your pain is not severe enough to require the prescription pain medication, you may take extra strength Tylenol instead which will have less side effects.  You  should also take a prescribed stool softener to avoid straining with bowel movements as the prescription pain medication may constipate you.   What to call us about: You should call the office ([phone]) if you develop fever > 101 or develop persistent vomiting. Activity:  You are encouraged to ambulate frequently (about every hour during waking hours) to help prevent blood clots from forming in your legs or lungs.     

## 2020-06-09 NOTE — Anesthesia Postprocedure Evaluation (Signed)
Anesthesia Post Note  Patient: [name]  Procedure(s) Performed: EXTRACORPOREAL SHOCK WAVE LITHOTRIPSY (ESWL) (Left )     Patient location during evaluation: PACU Anesthesia Type: MAC Level of consciousness: awake and alert Pain management: pain level controlled Vital Signs Assessment: post-procedure vital signs reviewed and stable Respiratory status: spontaneous breathing, nonlabored ventilation, respiratory function stable and patient connected to nasal cannula oxygen Cardiovascular status: stable and blood pressure returned to baseline Postop Assessment: no apparent nausea or vomiting Anesthetic complications: no   No complications documented.  Last Vitals:  Vitals:   06/09/20 0950 06/09/20 1210  BP: 123/68 111/70  Pulse: 64 (!) 59  Resp: 15 16  Temp: (!) 36.2 C 36.4 C  SpO2: 100% 100%    Last Pain:  Vitals:   06/09/20 1210  TempSrc:   PainSc: 6                  Caitlan Chauca

## 2020-06-09 NOTE — Anesthesia Procedure Notes (Signed)
Procedure Name: MAC Date/Time: 06/09/2020 11:39 AM Performed by: Bonney Aid, CRNA Pre-anesthesia Checklist: Patient identified, Emergency Drugs available, Suction available, Patient being monitored and Timeout performed Patient Re-evaluated:Patient Re-evaluated prior to induction Oxygen Delivery Method: Nasal cannula Placement Confirmation: positive ETCO2

## 2020-06-10 ENCOUNTER — Encounter (HOSPITAL_BASED_OUTPATIENT_CLINIC_OR_DEPARTMENT_OTHER): Payer: Self-pay | Admitting: Urology

## 2020-06-19 ENCOUNTER — Encounter: Payer: BC Managed Care – PPO | Admitting: Internal Medicine

## 2020-06-22 ENCOUNTER — Other Ambulatory Visit: Payer: Self-pay | Admitting: Adult Health

## 2020-07-01 ENCOUNTER — Ambulatory Visit (INDEPENDENT_AMBULATORY_CARE_PROVIDER_SITE_OTHER): Payer: BC Managed Care – PPO

## 2020-07-01 DIAGNOSIS — I428 Other cardiomyopathies: Secondary | ICD-10-CM | POA: Diagnosis not present

## 2020-07-01 LAB — CUP PACEART REMOTE DEVICE CHECK
Battery Remaining Longevity: 77 mo
Battery Remaining Percentage: 80 %
Battery Voltage: 2.93 V
Brady Statistic AP VP Percent: 5.5 %
Brady Statistic AP VS Percent: 1 %
Brady Statistic AS VP Percent: 94 %
Brady Statistic AS VS Percent: 1 %
Brady Statistic RA Percent Paced: 5.4 %
Date Time Interrogation Session: 20220426040023
Implantable Lead Implant Date: 20091016
Implantable Lead Implant Date: 20091016
Implantable Lead Implant Date: 20091016
Implantable Lead Location: 753858
Implantable Lead Location: 753859
Implantable Lead Location: 753860
Implantable Lead Model: 4196
Implantable Lead Model: 7122
Implantable Pulse Generator Implant Date: 20151111
Lead Channel Impedance Value: 390 Ohm
Lead Channel Impedance Value: 510 Ohm
Lead Channel Impedance Value: 760 Ohm
Lead Channel Pacing Threshold Amplitude: 0.25 V
Lead Channel Pacing Threshold Amplitude: 1 V
Lead Channel Pacing Threshold Amplitude: 1.25 V
Lead Channel Pacing Threshold Pulse Width: 0.3 ms
Lead Channel Pacing Threshold Pulse Width: 0.3 ms
Lead Channel Pacing Threshold Pulse Width: 0.6 ms
Lead Channel Sensing Intrinsic Amplitude: 3.6 mV
Lead Channel Sensing Intrinsic Amplitude: 8.5 mV
Lead Channel Setting Pacing Amplitude: 2 V
Lead Channel Setting Pacing Amplitude: 2.5 V
Lead Channel Setting Pacing Amplitude: 2.5 V
Lead Channel Setting Pacing Pulse Width: 0.3 ms
Lead Channel Setting Pacing Pulse Width: 0.6 ms
Lead Channel Setting Sensing Sensitivity: 2 mV
Pulse Gen Model: 3222
Pulse Gen Serial Number: 7688750

## 2020-07-07 ENCOUNTER — Other Ambulatory Visit: Payer: Self-pay

## 2020-07-07 MED ORDER — CARVEDILOL 12.5 MG PO TABS: 12.5000 mg | ORAL_TABLET | Freq: Two times a day (BID) | ORAL | 1 refills | Status: DC

## 2020-07-22 NOTE — Progress Notes (Signed)
Remote pacemaker transmission.   

## 2020-07-28 NOTE — Progress Notes (Deleted)
6 MONTH FOLLOW UP  Assessment and Plan:  Atherosclerosis of aorta (HCC) Control blood pressure, cholesterol, glucose, increase exercise.   Essential hypertension - continue medications, DASH diet, exercise and monitor at home. Call if greater than 130/80.  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 Talmage Nap - discussed low sugars and awareness,   Hyperlipidemia associated with T2DM (HCC) -     Lipid panel -continue medications, LDL goal <70, check lipids, decrease fatty foods, increase activity.  - with weight loss may be able to get off of fenofibrate - pending labs   Overweight Excellent progress with 80 lb weight loss this past year - long discussion about weight loss, diet, and exercise  Other migraine without status migrainosus, not intractable Avoid triggers  Medication management -     Magnesium  Vitamin D deficiency Continue supplement     She declines q49m follow ups, agrees to schedule q74m at minimum   Discussed med's effects and SE's.  labs and tests as requested with regular follow-up as recommended. Future Appointments  Date Time Provider Department Center  07/29/2020  8:45 AM Judd Gaudier, NP [facility] None  09/30/2020  8:40 AM [facility] DEVICE REMOTES [facility]   12/30/2020  8:40 AM [facility] DEVICE REMOTES [facility]  01/28/2021  3:00 PM Judd Gaudier, NP [facility] None  03/31/2021  8:40 AM [facility] DEVICE REMOTES [facility]  06/30/2021  8:40 AM [facility] DEVICE REMOTES [facility]    HPI 65 y.o. female  presents for 3 month follow up on CHF, htn, hyperlipidemia, T2DM with CKD, vitamin D def, weight.   She has OSA and is on CPAP, lost 80 lb and hasn't been wearing due to discomfort having to turn the pressure on the machine down, all sx have resolve, declines referral for retest.    Has history of migraines, infrequent, stress triggers, excedrine migraine, maxalt works well for her. Also on imipramine daily for sleep which helps.  Was also having new suspected tension/cericogenic headache, was following up with orh  She has hx of anxiety, prescribed valium, takes 2.5 mg occasionally, 1-2 days a year, typically around the holidays.   BMI is There is no height or weight on file to calculate BMI., she has been working on diet and exercise. She is trying to get down to 160 lb on home scale, down from peak weight of 240 lb in 08/2018.  Wt Readings from Last 3 Encounters:  06/09/20 175 lb (79.4 kg)  06/04/20 177 lb 6.4 oz (80.5 kg)  05/27/20 181 lb (82.1 kg)   Her blood pressure has been controlled at home, today their BP is   She does workout, does fitness walk program inside.   She denies chest pain, shortness of breath, dizziness.   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 Ladona Ridgel and Dr. Tresa Endo.  Last echo was 02/2018 with EF 50-55%. She is on BB, ARB, spirolactone, and torsemide- recenlty decreased due to hypotension from intentional weight loss.  She has aortic atherosclerosis per CT 07/2017.   She has been working on diet and exercise for Diabetes, she is off insulin and metofrmin this year following 80 lb weight loss, diet  controlled  With CKD she is on ACE/ARB on cozaar 25mg .  With hyperlipidemia is on crestor 40 and fenofibrate  With neuropathy With CAD she is on bASA  She follows up with Dr. for her DM  She is off insulin at this time, off of metformin as well since  She see's Dr. Talmage Nap for eye exams 01/2020 normal  denies hypoglycemia , polydipsia, polyuria and visual disturbances.  Does have paresthesias in bilateral feet/legs.   Last A1C was:  Lab Results  Component Value Date   HGBA1C 5.8 (H) 01/29/2020   Lab Results  Component Value Date   GFRNONAA 73 05/27/2020   Lab Results  Component Value Date   CHOL 121 01/29/2020   HDL 41 (L) 01/29/2020   LDLCALC 60 01/29/2020   TRIG 113 01/29/2020   CHOLHDL 3.0 01/29/2020   Patient is on Vitamin D supplement, unsure of dose, ? 2000 IU Lab Results  Component Value Date   VD25OH 52 01/29/2020      Current Medications:  Current Outpatient Medications on File Prior to Visit  Medication Sig Dispense Refill  . aspirin 81 MG tablet Take 162 mg by mouth at bedtime.     01/31/2020 aspirin-acetaminophen-caffeine (EXCEDRIN MIGRAINE) 250-250-65 MG per tablet Take 2 tablets by mouth daily as needed for migraine.    . carvedilol (COREG) 12.5 MG tablet Take 1 tablet (12.5 mg total) by mouth 2 (two) times daily. 180 tablet 1  . diazepam (VALIUM) 5 MG tablet Take 1/2 to 1 tablet at Bedtime ONLY if needed 30 tablet 0  . estradiol (ESTRACE) 1 MG tablet Take 1.5 tablets by mouth daily.  4  . fenofibrate micronized (LOFIBRA) 134 MG capsule Take 1 capsule (134 mg total) by mouth daily before breakfast. 60 capsule 1  . imipramine (TOFRANIL) 25 MG tablet TAKE 1 TO 2 TABLETS BY MOUTH AT BEDTIME AS NEEDED FOR SLEEP 180 tablet 0  . ketorolac (TORADOL) 10 MG tablet Take 1 tablet (10 mg total) by mouth every 6 (six) hours as needed. 30 tablet 0  . loratadine (CLARITIN) 10 MG tablet Take 10 mg by mouth daily.    Marland Kitchen losartan (COZAAR) 25 MG tablet Take 1 tablet (25 mg total)  by mouth daily. 60 tablet 1  . meloxicam (MOBIC) 15 MG tablet Take one daily with food for 2 weeks, can take with tylenol, can not take with aleve, iburpofen, then as needed daily for pain 30 tablet 0  . phentermine (ADIPEX-P) 37.5 MG tablet Take 1/2-1 tab daily PRN for appetite and weight loss. 30 tablet 2  . Potassium 99 MG TABS Take 0.5 tablets (49.5 mg total) by mouth daily. 330 tablet   . rizatriptan (MAXALT) 10 MG tablet Take 1 tablet for severe Migraine & May repeat in 2 hours if needed (Max 2 tabs /24 hours) 10 tablet 0  . rosuvastatin (CRESTOR) 40 MG tablet TAKE 1 TABLET(40 MG) BY MOUTH DAILY. 60 tablet 1  . spironolactone (ALDACTONE) 25 MG tablet Take 1 tablet (25 mg total) by mouth  2 (two) times daily. 180 tablet 3  . tamsulosin (FLOMAX) 0.4 MG CAPS capsule Take  1 capsule  Daily  for Bladder 90 capsule 1  . valACYclovir (VALTREX) 500 MG tablet TAKE 1 TABLET(500 MG) BY MOUTH DAILY 90 tablet 0  . verapamil (CALAN-SR) 240 MG CR tablet TAKE 1 TABLET(240 MG) BY MOUTH AT BEDTIME 60 tablet 1  . VITAMIN D PO Take 2,000 Units by mouth.     No current facility-administered medications on file prior to visit.    Medical History:  Past Medical History:  Diagnosis Date  . Automatic implantable cardiac defibrillator in situ    [employer]DEL 7122  . CHF (congestive heart failure) (HCC)   . Diabetes mellitus   . Hyperlipidemia   . Hypertension 06/25/2011   Renal dopplers - superior mesenteric artery >50% diameter reduction; R renal artery - normal patency; L proximal renal artery 1-59% reduction (lower end of scale); both kidneys normal in size/symmetry with normal cortex and medulla  . Leg pain 12/09/2010   doppler of R femoral artery - no evidence of dissection, AV fistula, pseudoaneurysm or other vascular abnormalities  . Migraines   . Obesity   . PONV (postoperative nausea and vomiting)   . Sleep apnea    on CPAP - AHI during sleep study was 44  . Type II or unspecified type diabetes  mellitus without mention of complication, not stated as uncontrolled   . Type II or unspecified type diabetes mellitus without mention of complication, not stated as uncontrolled   . Unspecified sleep apnea     Allergies  Allergen Reactions  . Flagyl [Metronidazole] Anaphylaxis  . Niacin Other (See Comments)  . Niacin And Related Other (See Comments) and Cough    flushing  . Penicillins Swelling and Rash    Has patient had a PCN reaction causing immediate rash, facial/tongue/throat swelling, SOB or lightheadedness with hypotension: No Has patient had a PCN reaction causing severe rash involving mucus membranes or skin necrosis: No Has patient had a PCN reaction that required hospitalization No Has patient had a PCN reaction occurring within the last 10 years: Yes 3-4 years ago If all of the above answers are "NO", then may proceed with Cephalosporin use.  . Yeast-Related Products Swelling and Rash     SURGICAL HISTORY She  has a past surgical history that includes Abdominal hysterectomy; Neck surgery; Pacemaker insertion; Cardiac defibrillator placement; Knee surgery (Right); doppler echocardiography (06/25/2011); Cardiovascular stress test (08/28/2010); Cardiac catheterization (07/15/2006); Biv pacemaker generator change out (N/A, 01/16/2014); and Extracorporeal shock wave lithotripsy (Left, 06/09/2020). FAMILY HISTORY Her family history includes Cancer - Cervical in her mother; Colon cancer in her maternal uncle; Colon cancer (age of onset: 32) in her maternal grandfather; Colon polyps in her mother; Hearing loss in her father; Heart attack in her father; Hypertension in her father. SOCIAL HISTORY She  reports that she quit smoking about 45 years ago. Her smoking use included cigarettes. She has never used smokeless tobacco. She reports previous alcohol use. She reports that she does not use drugs. Has not had alcohol in 8 months.   ***  Review of Systems  Constitutional: Negative for  malaise/fatigue and weight loss.  HENT: Negative for hearing loss and tinnitus.   Eyes: Negative for blurred vision and double vision.  Respiratory: Negative for cough, sputum production, shortness of breath and wheezing.   Cardiovascular: Negative for chest pain, palpitations, orthopnea, claudication, leg swelling and PND.  Gastrointestinal: Negative for abdominal pain, blood  in stool, constipation, diarrhea, heartburn, melena, nausea and vomiting.  Genitourinary: Negative.   Musculoskeletal: Negative for falls, joint pain and myalgias.  Skin: Negative for rash.  Neurological: Negative for dizziness, tingling, sensory change, weakness and headaches.  Endo/Heme/Allergies: Negative for polydipsia.  Psychiatric/Behavioral: Negative.  Negative for depression, memory loss, substance abuse and suicidal ideas. The patient is not nervous/anxious and does not have insomnia.   All other systems reviewed and are negative.   Physical Exam: Estimated body mass index is 27.41 kg/m as calculated from the following:   Height as of 06/09/20: 5\' 7"  (1.702 m).   Weight as of 06/09/20: 175 lb (79.4 kg). There were no vitals taken for this visit. General Appearance: Well nourished, in no apparent distress. Eyes: PERRLA, EOMs, conjunctiva no swelling or erythema Sinuses: No Frontal/maxillary tenderness ENT/Mouth: Ext aud canals clear, normal light reflex with TMs without erythema, bulging.  Good dentition. No erythema, swelling, or exudate on post pharynx. Tonsils not swollen or erythematous. Hearing normal.  Neck: Supple, thyroid normal. No bruits Respiratory: Respiratory effort normal, BS equal bilaterally without rales, rhonchi, wheezing or stridor. Cardio: RRR without murmurs, rubs or gallops. Brisk peripheral pulses without edema, with bilateral hard palpable cords without erythema, warmth, tenderness.  Chest: symmetric, with normal excursions and percussion. Abdomen: obese, soft, nontender without  organomegaly.   Lymphatics: Non tender without lymphadenopathy.  Musculoskeletal: Full ROM all peripheral extremities,5/5 strength, and normal gait. Skin: Warm, dry without rashes, lesions, ecchymosis.  Neuro: Cranial nerves intact, reflexes equal bilaterally. Normal muscle tone, no cerebellar symptoms. Sensation decreased bilateral feet to 2/3 up shin.  Psych: Awake and oriented X 3, normal affect, Insight and Judgment appropriate.   Dan Maker, NP 7:51 AM [facility]

## 2020-07-29 ENCOUNTER — Ambulatory Visit: Payer: BC Managed Care – PPO | Admitting: Adult Health

## 2020-08-12 ENCOUNTER — Other Ambulatory Visit: Payer: Self-pay | Admitting: *Deleted

## 2020-08-12 DIAGNOSIS — I10 Essential (primary) hypertension: Secondary | ICD-10-CM

## 2020-08-12 MED ORDER — VERAPAMIL HCL ER 240 MG PO TBCR: EXTENDED_RELEASE_TABLET | ORAL | 5 refills | Status: DC

## 2020-08-12 MED ORDER — FENOFIBRATE 134 MG PO CAPS: 134.0000 mg | ORAL_CAPSULE | Freq: Every day | ORAL | 5 refills | Status: DC

## 2020-08-12 MED ORDER — LOSARTAN POTASSIUM 25 MG PO TABS: 25.0000 mg | ORAL_TABLET | Freq: Every day | ORAL | 5 refills | Status: DC

## 2020-08-12 MED ORDER — ROSUVASTATIN CALCIUM 40 MG PO TABS: 40.0000 mg | ORAL_TABLET | Freq: Every day | ORAL | 5 refills | Status: DC

## 2020-08-21 ENCOUNTER — Other Ambulatory Visit: Payer: Self-pay | Admitting: Internal Medicine

## 2020-08-21 MED ORDER — PROMETHAZINE-DM 6.25-15 MG/5ML PO SYRP: ORAL_SOLUTION | ORAL | 1 refills | Status: DC

## 2020-08-21 MED ORDER — DEXAMETHASONE 4 MG PO TABS: ORAL_TABLET | ORAL | 0 refills | Status: DC

## 2020-09-01 ENCOUNTER — Ambulatory Visit: Payer: BC Managed Care – PPO | Admitting: Adult Health

## 2020-09-04 ENCOUNTER — Ambulatory Visit (INDEPENDENT_AMBULATORY_CARE_PROVIDER_SITE_OTHER): Payer: BC Managed Care – PPO | Admitting: Adult Health

## 2020-09-04 ENCOUNTER — Other Ambulatory Visit: Payer: Self-pay

## 2020-09-04 ENCOUNTER — Encounter: Payer: Self-pay | Admitting: Adult Health

## 2020-09-04 VITALS — BP 120/72 | HR 86 | Temp 97.2°F | Ht 67.0 in | Wt 183.0 lb

## 2020-09-04 DIAGNOSIS — E1122 Type 2 diabetes mellitus with diabetic chronic kidney disease: Secondary | ICD-10-CM | POA: Diagnosis not present

## 2020-09-04 DIAGNOSIS — E785 Hyperlipidemia, unspecified: Secondary | ICD-10-CM

## 2020-09-04 DIAGNOSIS — E559 Vitamin D deficiency, unspecified: Secondary | ICD-10-CM | POA: Diagnosis not present

## 2020-09-04 DIAGNOSIS — Z79899 Other long term (current) drug therapy: Secondary | ICD-10-CM | POA: Diagnosis not present

## 2020-09-04 DIAGNOSIS — E1169 Type 2 diabetes mellitus with other specified complication: Secondary | ICD-10-CM | POA: Diagnosis not present

## 2020-09-04 DIAGNOSIS — N182 Chronic kidney disease, stage 2 (mild): Secondary | ICD-10-CM

## 2020-09-04 DIAGNOSIS — I10 Essential (primary) hypertension: Secondary | ICD-10-CM | POA: Diagnosis not present

## 2020-09-04 DIAGNOSIS — E119 Type 2 diabetes mellitus without complications: Secondary | ICD-10-CM | POA: Diagnosis not present

## 2020-09-04 DIAGNOSIS — E663 Overweight: Secondary | ICD-10-CM

## 2020-09-04 DIAGNOSIS — G4700 Insomnia, unspecified: Secondary | ICD-10-CM

## 2020-09-04 MED ORDER — OZEMPIC (0.25 OR 0.5 MG/DOSE) 2 MG/1.5ML ~~LOC~~ SOPN: PEN_INJECTOR | SUBCUTANEOUS | 0 refills | Status: DC

## 2020-09-04 NOTE — Patient Instructions (Signed)
Semaglutide injection solution What is this medication? SEMAGLUTIDE (Sem a GLOO tide) is used to improve blood sugar control in adults with type 2 diabetes. This medicine may be used with other diabetes medicines. This drug may also reduce the risk of heart attack or stroke if you have type 2diabetes and risk factors for heart disease. This medicine may be used for other purposes; ask your health care provider orpharmacist if you have questions. COMMON BRAND NAME(S): OZEMPIC What should I tell my care team before I take this medication? They need to know if you have any of these conditions: endocrine tumors (MEN 2) or if someone in your family had these tumors eye disease, vision problems history of pancreatitis kidney disease stomach problems thyroid cancer or if someone in your family had thyroid cancer an unusual or allergic reaction to semaglutide, other medicines, foods, dyes, or preservatives pregnant or trying to get pregnant breast-feeding How should I use this medication? This medicine is for injection under the skin of your upper leg (thigh), stomach area, or upper arm. It is given once every week (every 7 days). You will be taught how to prepare and give this medicine. Use exactly as directed. Take your medicine at regular intervals. Do not take it more often thandirected. If you use this medicine with insulin, you should inject this medicine and the insulin separately. Do not mix them together. Do not give the injections rightnext to each other. Change (rotate) injection sites with each injection. It is important that you put your used needles and syringes in a special sharps container. Do not put them in a trash can. If you do not have a sharpscontainer, call your pharmacist or healthcare provider to get one. A special MedGuide will be given to you by the pharmacist with eachprescription and refill. Be sure to read this information carefully each time. This drug comes with  INSTRUCTIONS FOR USE. Ask your pharmacist for directions on how to use this drug. Read the information carefully. Talk to yourpharmacist or health care provider if you have questions. Talk to your pediatrician regarding the use of this medicine in children.Special care may be needed. Overdosage: If you think you have taken too much of this medicine contact apoison control center or emergency room at once. NOTE: This medicine is only for you. Do not share this medicine with others. What if I miss a dose? If you miss a dose, take it as soon as you can within 5 days after the missed dose. Then take your next dose at your regular weekly time. If it has been longer than 5 days after the missed dose, do not take the missed dose. Take the next dose at your regular time. Do not take double or extra doses. If you havequestions about a missed dose, contact your health care provider for advice. What may interact with this medication? other medicines for diabetes Many medications may cause changes in blood sugar, these include: alcohol containing beverages antiviral medicines for HIV or AIDS aspirin and aspirin-like drugs certain medicines for blood pressure, heart disease, irregular heart beat chromium diuretics female hormones, such as estrogens or progestins, birth control pills fenofibrate gemfibrozil isoniazid lanreotide female hormones or anabolic steroids MAOIs like Carbex, Eldepryl, Marplan, Nardil, and Parnate medicines for weight loss medicines for allergies, asthma, cold, or cough medicines for depression, anxiety, or psychotic disturbances niacin nicotine NSAIDs, medicines for pain and inflammation, like ibuprofen or naproxen octreotide pasireotide pentamidine phenytoin probenecid quinolone antibiotics such as ciprofloxacin, levofloxacin, ofloxacin   some herbal dietary supplements steroid medicines such as prednisone or cortisone sulfamethoxazole; trimethoprim thyroid hormones Some  medications can hide the warning symptoms of low blood sugar (hypoglycemia). You may need to monitor your blood sugar more closely if youare taking one of these medications. These include: beta-blockers, often used for high blood pressure or heart problems (examples include atenolol, metoprolol, propranolol) clonidine guanethidine reserpine This list may not describe all possible interactions. Give your health care provider a list of all the medicines, herbs, non-prescription drugs, or dietary supplements you use. Also tell them if you smoke, drink alcohol, or use illegaldrugs. Some items may interact with your medicine. What should I watch for while using this medication? Visit your doctor or health care professional for regular checks on yourprogress. Drink plenty of fluids while taking this medicine. Check with your doctor or health care professional if you get an attack of severe diarrhea, nausea, and vomiting. The loss of too much body fluid can make it dangerous for you to takethis medicine. A test called the HbA1C (A1C) will be monitored. This is a simple blood test. It measures your blood sugar control over the last 2 to 3 months. You willreceive this test every 3 to 6 months. Learn how to check your blood sugar. Learn the symptoms of low and high bloodsugar and how to manage them. Always carry a quick-source of sugar with you in case you have symptoms of low blood sugar. Examples include hard sugar candy or glucose tablets. Make sure others know that you can choke if you eat or drink when you develop serious symptoms of low blood sugar, such as seizures or unconsciousness. They must getmedical help at once. Tell your doctor or health care professional if you have high blood sugar. You might need to change the dose of your medicine. If you are sick or exercisingmore than usual, you might need to change the dose of your medicine. Do not skip meals. Ask your doctor or health care professional if  you should avoid alcohol. Many nonprescription cough and cold products contain sugar oralcohol. These can affect blood sugar. Pens should never be shared. Even if the needle is changed, sharing may resultin passing of viruses like hepatitis or HIV. Wear a medical ID bracelet or chain, and carry a card that describes yourdisease and details of your medicine and dosage times. Do not become pregnant while taking this medicine. Women should inform their doctor if they wish to become pregnant or think they might be pregnant. There is a potential for serious side effects to an unborn child. Talk to your healthcare professional or pharmacist for more information. What side effects may I notice from receiving this medication? Side effects that you should report to your doctor or health care professionalas soon as possible: allergic reactions like skin rash, itching or hives, swelling of the face, lips, or tongue breathing problems changes in vision diarrhea that continues or is severe lump or swelling on the neck severe nausea signs and symptoms of infection like fever or chills; cough; sore throat; pain or trouble passing urine signs and symptoms of low blood sugar such as feeling anxious, confusion, dizziness, increased hunger, unusually weak or tired, sweating, shakiness, cold, irritable, headache, blurred vision, fast heartbeat, loss of consciousness signs and symptoms of kidney injury like trouble passing urine or change in the amount of urine trouble swallowing unusual stomach upset or pain vomiting Side effects that usually do not require medical attention (report to yourdoctor or health care professional   if they continue or are bothersome): constipation diarrhea nausea pain, redness, or irritation at site where injected stomach upset This list may not describe all possible side effects. Call your doctor for medical advice about side effects. You may report side effects to FDA  at1-800-FDA-1088. Where should I keep my medication? Keep out of the reach of children. Store unopened pens in a refrigerator between 2 and 8 degrees C (36 and 46 degrees F). Do not freeze. Protect from light and heat. After you first use the pen, it can be stored for 56 days at room temperature between 15 and 30 degrees C (59 and 86 degrees F) or in a refrigerator. Throw away your used pen after 56days or after the expiration date, whichever comes first. Do not store your pen with the needle attached. If the needle is left on,medicine may leak from the pen. NOTE: This sheet is a summary. It may not cover all possible information. If you have questions about this medicine, talk to your doctor, pharmacist, orhealth care provider.  2022 Elsevier/Gold Standard (2018-11-07 09:41:51)  

## 2020-09-04 NOTE — Progress Notes (Signed)
Assessment and Plan:  [name] was seen today for follow-up.  Diagnoses and all orders for this visit:  Essential hypertension Continue medication Monitor blood pressure at home; call if consistently over 130/80 Continue DASH diet.   Reminder to go to the ER if any CP, SOB, nausea, dizziness, severe HA, changes vision/speech, left arm numbness and tingling and jaw pain. -     CBC with Differential/Platelet -     COMPLETE METABOLIC PANEL WITH GFR -     Magnesium  Type 2 diabetes mellitus with stage 2 chronic kidney disease, without long-term current use of insulin [name]) Education: Reviewed 'ABCs' of diabetes management (respective goals in parentheses):  A1C (<7), blood pressure (<130/80), and cholesterol (LDL <70) Eye Exam yearly and Dental Exam every 6 months. Dietary recommendations Physical Activity recommendations -     Hemoglobin A1c -     Semaglutide,0.25 or 0.5MG /DOS, (OZEMPIC, 0.25 OR 0.5 MG/DOSE,) 2 MG/1.5ML SOPN; Start by injecting 0.25 mg into skin of stomach once weekly; if doing well increase to 0.5 mg in 4 weeks.  Hyperlipidemia associated with type 2 diabetes mellitus (HCC) Continue medications LDL goal <70 Continue low cholesterol diet and exercise.  Check lipid panel.  -     Lipid panel -     TSH  CKD stage 2 due to type 2 diabetes mellitus (HCC) Increase fluids, avoid NSAIDS, monitor sugars, will monitor -     COMPLETE METABOLIC PANEL WITH GFR  Vitamin D deficiency -     VITAMIN D 25 Hydroxy (Vit-D Deficiency, Fractures)  Overweight (BMI 25.0-29.9) Long discussion about weight loss, diet, and exercise Recommended diet heavy in fruits and veggies and low in animal meats, cheeses, and dairy products, appropriate calorie intake Will start ozempic with taper - see diabetes notes Discussed appropriate weight for height  Follow up at next visit  Insomnia, unspecified type Insomnia- good sleep hygiene discussed, increase day time activity, try melatonin or  benadryl, valium is to be limited to <5 days/week, max 4 weeks. If this does not help we will call in sleep medication - discussed gabapentin   Further disposition pending results of labs. Discussed med's effects and SE's.   Over 30 minutes of exam, counseling, chart review, and critical decision making was performed.   Future Appointments  Date Time Provider Department Center  09/30/2020  8:40 AM [facility] DEVICE REMOTES [facility]  12/30/2020  8:40 AM [facility] DEVICE REMOTES [facility]  01/28/2021  3:00 PM Judd Gaudier, NP [facility] None  03/31/2021  8:40 AM [facility] DEVICE REMOTES [facility]  06/30/2021  8:40 AM [facility] DEVICE REMOTES [facility]    ------------------------------------------------------------------------------------------------------------------   HPI BP 120/72   Pulse 86   Temp (!) 97.2 F (36.2 C)   Ht 5\' 7"  (1.702 m)   Wt 183 lb (83 kg)   SpO2 98%   BMI 28.66 kg/m   65 y.o.female presents for evaluation for concerns with weight gain following recent covid 19 and also overdue for routine follow up. Complex cardiac history (followed by Dr. ) and lifestyle controlled diabetic.   Recently 08/21/2020 she had covid 19 and was prescribed dexamethasone taper. She reports has recovered with residual brain fog, fatigue, some night terrors and insomnia, weight gain, night time peripheral burning pain in her legs. She prefers to avoid new prescription. Does have valium for muscle relaxing (from previous neck surgery) but hasn't tried. She understands benzo is not recommended for sleep longer than 30 days, risks of addiction with daily use.  she is prescribed phentermine for weight loss. She did restart following covid 19 but reports doesn't see benefit as she did previously.  While on the medication they have lost 0 lbs since last visit.   BMI is Body mass index is 28.66 kg/m., she is working on  diet and exercise. She has hx of 80 lb weight loss, essentially reversing diabetes and OSA, no longer on CPAP.  Wt Readings from Last 3 Encounters:  09/04/20 183 lb (83 kg)  06/09/20 175 lb (79.4 kg)  06/04/20 177 lb 6.4 oz (80.5 kg)   Her blood pressure has been controlled at home, today their BP is BP: 120/72  She does workout, slowly restarting. She denies chest pain, shortness of breath, does have some dizziness with rapid position changes.    She is on cholesterol medication (rosuvastatin, fenofibrate) and denies myalgias. Her cholesterol is at goal. The cholesterol last visit was:   Lab Results  Component Value Date   CHOL 121 01/29/2020   HDL 41 (L) 01/29/2020   LDLCALC 60 01/29/2020   TRIG 113 01/29/2020   CHOLHDL 3.0 01/29/2020    She has been working on diet and exercise for diet controlled diabetes (previously was on metformin, insulin) and denies increased appetite, nausea, polydipsia, polyuria, and visual disturbances. She reports has home glucose/A1C monitor, did have some glucose 300+ while she was ill. She reports has home A1C monitor, has monitored and reports was 6.5-7%.  Last A1C in the office was:  Lab Results  Component Value Date   HGBA1C 5.8 (H) 01/29/2020   Patient is on Vitamin D supplement.   Lab Results  Component Value Date   VD25OH 52 01/29/2020        Past Medical History:  Diagnosis Date   Automatic implantable cardiac defibrillator in situ    [employer] MODEL 7122   CHF (congestive heart failure) (HCC)    Diabetes mellitus    Hyperlipidemia    Hypertension 06/25/2011   Renal dopplers - superior mesenteric artery >50% diameter reduction; R renal artery - normal patency; L proximal renal artery 1-59% reduction (lower end of scale); both kidneys normal in size/symmetry with normal cortex and medulla   Leg pain 12/09/2010   doppler of R femoral artery - no evidence of dissection, AV fistula, pseudoaneurysm or other vascular abnormalities   Migraines     Obesity    PONV (postoperative nausea and vomiting)    Sleep apnea    on CPAP - AHI during sleep study was 44   Type II or unspecified type diabetes mellitus without mention of complication, not stated as uncontrolled    Type II or unspecified type diabetes mellitus without mention of complication, not stated as uncontrolled    Unspecified sleep apnea      Allergies  Allergen Reactions   Flagyl [Metronidazole] Anaphylaxis   Niacin Other (See Comments)   Niacin And Related Other (See Comments) and Cough    flushing   Penicillins Swelling and Rash    Has patient had a PCN reaction causing immediate rash, facial/tongue/throat swelling, SOB or lightheadedness with hypotension: No Has patient had a PCN reaction causing severe rash involving mucus membranes or skin necrosis: No Has patient had a PCN reaction that required hospitalization No Has patient had a PCN reaction occurring within the last 10 years: Yes 3-4 years ago If all of the above answers are "NO", then may proceed with Cephalosporin use.   Yeast-Related Products Swelling and Rash    Current  Outpatient Medications on File Prior to Visit  Medication Sig   aspirin 81 MG tablet Take 162 mg by mouth at bedtime.    aspirin-acetaminophen-caffeine (EXCEDRIN MIGRAINE) 250-250-65 MG per tablet Take 2 tablets by mouth daily as needed for migraine.   carvedilol (COREG) 12.5 MG tablet Take 1 tablet (12.5 mg total) by mouth 2 (two) times daily.   diazepam (VALIUM) 5 MG tablet Take 1/2 to 1 tablet at Bedtime ONLY if needed   estradiol (ESTRACE) 1 MG tablet Take 1.5 tablets by mouth daily.   fenofibrate micronized (LOFIBRA) 134 MG capsule Take 1 capsule (134 mg total) by mouth daily before breakfast.   imipramine (TOFRANIL) 25 MG tablet TAKE 1 TO 2 TABLETS BY MOUTH AT BEDTIME AS NEEDED FOR SLEEP   loratadine (CLARITIN) 10 MG tablet Take 10 mg by mouth daily.   losartan (COZAAR) 25 MG tablet Take 1 tablet (25 mg total) by mouth daily.    meloxicam (MOBIC) 15 MG tablet Take one daily with food for 2 weeks, can take with tylenol, can not take with aleve, iburpofen, then as needed daily for pain   Potassium 99 MG TABS Take 0.5 tablets (49.5 mg total) by mouth daily.   promethazine-dextromethorphan (PROMETHAZINE-DM) 6.25-15 MG/5ML syrup Take 1 tsp every 4 hours if needed for cough   rizatriptan (MAXALT) 10 MG tablet Take 1 tablet for severe Migraine & May repeat in 2 hours if needed (Max 2 tabs /24 hours)   rosuvastatin (CRESTOR) 40 MG tablet Take 1 tablet (40 mg total) by mouth daily. TAKE 1 TABLET(40 MG) BY MOUTH DAILY.   spironolactone (ALDACTONE) 25 MG tablet Take 1 tablet (25 mg total) by mouth 2 (two) times daily.   tamsulosin (FLOMAX) 0.4 MG CAPS capsule Take  1 capsule  Daily  for Bladder   valACYclovir (VALTREX) 500 MG tablet TAKE 1 TABLET(500 MG) BY MOUTH DAILY   verapamil (CALAN-SR) 240 MG CR tablet TAKE 1 TABLET(240 MG) BY MOUTH AT BEDTIME   VITAMIN D PO Take 2,000 Units by mouth.   No current facility-administered medications on file prior to visit.   Surgical History:  She  has a past surgical history that includes Abdominal hysterectomy; Neck surgery; Pacemaker insertion; Cardiac defibrillator placement; Knee surgery (Right); doppler echocardiography (06/25/2011); Cardiovascular stress test (08/28/2010); Cardiac catheterization (07/15/2006); Biv pacemaker generator change out (N/A, 01/16/2014); and Extracorporeal shock wave lithotripsy (Left, 06/09/2020). Family History:  Herfamily history includes Cancer - Cervical in her mother; Colon cancer in her maternal uncle; Colon cancer (age of onset: 80) in her maternal grandfather; Colon polyps in her mother; Hearing loss in her father; Heart attack in her father; Hypertension in her father. Social History:   reports that she quit smoking about 45 years ago. Her smoking use included cigarettes. She has never used smokeless tobacco. She reports previous alcohol use. She reports that  she does not use drugs.  ROS: all negative except above.   Physical Exam:  BP 120/72   Pulse 86   Temp (!) 97.2 F (36.2 C)   Ht 5\' 7"  (1.702 m)   Wt 183 lb (83 kg)   SpO2 98%   BMI 28.66 kg/m   General Appearance: Well nourished, in no apparent distress. Eyes: PERRLA, EOMs, conjunctiva no swelling or erythema Sinuses: No Frontal/maxillary tenderness ENT/Mouth: Ext aud canals clear, TMs without erythema, bulging. No erythema, swelling, or exudate on post pharynx.  Tonsils not swollen or erythematous. Hearing normal.  Neck: Supple, thyroid normal.  Respiratory: Respiratory effort normal,  BS equal bilaterally without rales, rhonchi, wheezing or stridor.  Cardio: RRR with no MRGs. Brisk peripheral pulses without edema.  Abdomen: Soft, + BS.  Non tender, no guarding, rebound, hernias, masses. Lymphatics: Non tender without lymphadenopathy.  Musculoskeletal: Full ROM, 5/5 strength, normal gait.  Skin: Warm, dry without rashes, lesions, ecchymosis.  Neuro: Cranial nerves intact. Normal muscle tone, no cerebellar symptoms. Sensation intact.  Psych: Awake and oriented X 3, depressed affect, Insight and Judgment appropriate.    Dan Maker, NP 5:00 PM [facility]

## 2020-09-05 ENCOUNTER — Other Ambulatory Visit: Payer: Self-pay | Admitting: Adult Health

## 2020-09-05 LAB — COMPLETE METABOLIC PANEL WITH GFR
AG Ratio: 1.6 (calc) (ref 1.0–2.5)
ALT: 17 U/L (ref 6–29)
AST: 15 U/L (ref 10–35)
Albumin: 4.2 g/dL (ref 3.6–5.1)
Alkaline phosphatase (APISO): 56 U/L (ref 37–153)
BUN: 22 mg/dL (ref 7–25)
CO2: 32 mmol/L (ref 20–32)
Calcium: 10 mg/dL (ref 8.6–10.4)
Chloride: 97 mmol/L — ABNORMAL LOW (ref 98–110)
Creat: 0.86 mg/dL (ref 0.50–0.99)
GFR, Est African American: 83 mL/min/{1.73_m2} (ref >=60)
GFR, Est Non African American: 71 mL/min/{1.73_m2} (ref >=60)
Globulin: 2.7 g/dL (calc) (ref 1.9–3.7)
Glucose, Bld: 393 mg/dL — ABNORMAL HIGH (ref 65–99)
Potassium: 4.7 mmol/L (ref 3.5–5.3)
Sodium: 136 mmol/L (ref 135–146)
Total Bilirubin: 0.5 mg/dL (ref 0.2–1.2)
Total Protein: 6.9 g/dL (ref 6.1–8.1)

## 2020-09-05 LAB — LIPID PANEL
Cholesterol: 154 mg/dL (ref <200)
HDL: 40 mg/dL — ABNORMAL LOW (ref >=50)
LDL Cholesterol (Calc): 85 mg/dL (calc)
Non-HDL Cholesterol (Calc): 114 mg/dL (calc) (ref <130)
Total CHOL/HDL Ratio: 3.9 (calc) (ref <5.0)
Triglycerides: 191 mg/dL — ABNORMAL HIGH (ref <150)

## 2020-09-05 LAB — VITAMIN D 25 HYDROXY (VIT D DEFICIENCY, FRACTURES): Vit D, 25-Hydroxy: 37 ng/mL (ref 30–100)

## 2020-09-05 LAB — CBC WITH DIFFERENTIAL/PLATELET
Absolute Monocytes: 765 cells/uL (ref 200–950)
Basophils Absolute: 71 cells/uL (ref 0–200)
Basophils Relative: 0.7 %
Eosinophils Absolute: 286 cells/uL (ref 15–500)
Eosinophils Relative: 2.8 %
HCT: 42.9 % (ref 35.0–45.0)
Hemoglobin: 14.4 g/dL (ref 11.7–15.5)
Lymphs Abs: 2795 cells/uL (ref 850–3900)
MCH: 31.2 pg (ref 27.0–33.0)
MCHC: 33.6 g/dL (ref 32.0–36.0)
MCV: 93.1 fL (ref 80.0–100.0)
MPV: 10.5 fL (ref 7.5–12.5)
Monocytes Relative: 7.5 %
Neutro Abs: 6283 cells/uL (ref 1500–7800)
Neutrophils Relative %: 61.6 %
Platelets: 284 10*3/uL (ref 140–400)
RBC: 4.61 10*6/uL (ref 3.80–5.10)
RDW: 12.5 % (ref 11.0–15.0)
Total Lymphocyte: 27.4 %
WBC: 10.2 10*3/uL (ref 3.8–10.8)

## 2020-09-05 LAB — HEMOGLOBIN A1C
Hgb A1c MFr Bld: 10.4 % of total Hgb — ABNORMAL HIGH (ref <5.7)
Mean Plasma Glucose: 252 mg/dL
eAG (mmol/L): 13.9 mmol/L

## 2020-09-05 LAB — MAGNESIUM: Magnesium: 1.9 mg/dL (ref 1.5–2.5)

## 2020-09-05 LAB — TSH: TSH: 2.17 mIU/L (ref 0.40–4.50)

## 2020-09-05 MED ORDER — METFORMIN HCL ER 500 MG PO TB24: ORAL_TABLET | ORAL | 2 refills | Status: DC

## 2020-09-10 ENCOUNTER — Telehealth: Payer: Self-pay

## 2020-09-10 NOTE — Telephone Encounter (Signed)
Prior Authorization for Ozempic submitted.  

## 2020-09-17 NOTE — Telephone Encounter (Signed)
Results to the PA for Ozempic: Denied.

## 2020-09-18 ENCOUNTER — Other Ambulatory Visit: Payer: Self-pay | Admitting: Adult Health

## 2020-09-18 DIAGNOSIS — N182 Chronic kidney disease, stage 2 (mild): Secondary | ICD-10-CM

## 2020-09-18 DIAGNOSIS — E1122 Type 2 diabetes mellitus with diabetic chronic kidney disease: Secondary | ICD-10-CM

## 2020-09-30 ENCOUNTER — Ambulatory Visit (INDEPENDENT_AMBULATORY_CARE_PROVIDER_SITE_OTHER): Payer: BC Managed Care – PPO

## 2020-09-30 DIAGNOSIS — I5022 Chronic systolic (congestive) heart failure: Secondary | ICD-10-CM

## 2020-09-30 DIAGNOSIS — I428 Other cardiomyopathies: Secondary | ICD-10-CM | POA: Diagnosis not present

## 2020-09-30 LAB — CUP PACEART REMOTE DEVICE CHECK
Battery Remaining Longevity: 23 mo
Battery Remaining Percentage: 25 %
Battery Voltage: 2.92 V
Brady Statistic AP VP Percent: 6.6 %
Brady Statistic AP VS Percent: 1 %
Brady Statistic AS VP Percent: 93 %
Brady Statistic AS VS Percent: 1 %
Brady Statistic RA Percent Paced: 6.5 %
Date Time Interrogation Session: 20220725200523
Implantable Lead Implant Date: 20091016
Implantable Lead Implant Date: 20091016
Implantable Lead Implant Date: 20091016
Implantable Lead Location: 753858
Implantable Lead Location: 753859
Implantable Lead Location: 753860
Implantable Lead Model: 4196
Implantable Lead Model: 7122
Implantable Pulse Generator Implant Date: 20151111
Lead Channel Impedance Value: 390 Ohm
Lead Channel Impedance Value: 480 Ohm
Lead Channel Impedance Value: 760 Ohm
Lead Channel Pacing Threshold Amplitude: 0.25 V
Lead Channel Pacing Threshold Amplitude: 1 V
Lead Channel Pacing Threshold Amplitude: 1.25 V
Lead Channel Pacing Threshold Pulse Width: 0.3 ms
Lead Channel Pacing Threshold Pulse Width: 0.3 ms
Lead Channel Pacing Threshold Pulse Width: 0.6 ms
Lead Channel Sensing Intrinsic Amplitude: 10.5 mV
Lead Channel Sensing Intrinsic Amplitude: 3.5 mV
Lead Channel Setting Pacing Amplitude: 2 V
Lead Channel Setting Pacing Amplitude: 2.5 V
Lead Channel Setting Pacing Amplitude: 2.5 V
Lead Channel Setting Pacing Pulse Width: 0.3 ms
Lead Channel Setting Pacing Pulse Width: 0.6 ms
Lead Channel Setting Sensing Sensitivity: 2 mV
Pulse Gen Model: 3222
Pulse Gen Serial Number: 7688750

## 2020-10-06 ENCOUNTER — Ambulatory Visit: Payer: BC Managed Care – PPO | Admitting: Adult Health

## 2020-10-27 NOTE — Progress Notes (Signed)
Remote pacemaker transmission.   

## 2020-11-03 ENCOUNTER — Other Ambulatory Visit: Payer: Self-pay

## 2020-11-03 DIAGNOSIS — E1122 Type 2 diabetes mellitus with diabetic chronic kidney disease: Secondary | ICD-10-CM

## 2020-11-03 DIAGNOSIS — N182 Chronic kidney disease, stage 2 (mild): Secondary | ICD-10-CM

## 2020-11-03 MED ORDER — OZEMPIC (0.25 OR 0.5 MG/DOSE) 2 MG/1.5ML ~~LOC~~ SOPN: PEN_INJECTOR | SUBCUTANEOUS | 1 refills | Status: DC

## 2020-12-01 ENCOUNTER — Telehealth: Payer: Self-pay

## 2020-12-01 NOTE — Telephone Encounter (Signed)
Prior Authorization for Ozempic is approved.

## 2020-12-30 ENCOUNTER — Ambulatory Visit (INDEPENDENT_AMBULATORY_CARE_PROVIDER_SITE_OTHER): Payer: BC Managed Care – PPO

## 2020-12-30 DIAGNOSIS — I428 Other cardiomyopathies: Secondary | ICD-10-CM | POA: Diagnosis not present

## 2020-12-30 DIAGNOSIS — I5022 Chronic systolic (congestive) heart failure: Secondary | ICD-10-CM

## 2020-12-30 LAB — CUP PACEART REMOTE DEVICE CHECK
Battery Remaining Longevity: 20 mo
Battery Remaining Percentage: 22 %
Battery Voltage: 2.9 V
Brady Statistic AP VP Percent: 3.9 %
Brady Statistic AP VS Percent: 1 %
Brady Statistic AS VP Percent: 96 %
Brady Statistic AS VS Percent: 1 %
Brady Statistic RA Percent Paced: 3.9 %
Date Time Interrogation Session: 20221024203457
Implantable Lead Implant Date: 20091016
Implantable Lead Implant Date: 20091016
Implantable Lead Implant Date: 20091016
Implantable Lead Location: 753858
Implantable Lead Location: 753859
Implantable Lead Location: 753860
Implantable Lead Model: 4196
Implantable Lead Model: 7122
Implantable Pulse Generator Implant Date: 20151111
Lead Channel Impedance Value: 380 Ohm
Lead Channel Impedance Value: 530 Ohm
Lead Channel Impedance Value: 730 Ohm
Lead Channel Pacing Threshold Amplitude: 0.25 V
Lead Channel Pacing Threshold Amplitude: 1 V
Lead Channel Pacing Threshold Amplitude: 1.25 V
Lead Channel Pacing Threshold Pulse Width: 0.3 ms
Lead Channel Pacing Threshold Pulse Width: 0.3 ms
Lead Channel Pacing Threshold Pulse Width: 0.6 ms
Lead Channel Sensing Intrinsic Amplitude: 3.8 mV
Lead Channel Sensing Intrinsic Amplitude: 8.8 mV
Lead Channel Setting Pacing Amplitude: 2 V
Lead Channel Setting Pacing Amplitude: 2.5 V
Lead Channel Setting Pacing Amplitude: 2.5 V
Lead Channel Setting Pacing Pulse Width: 0.3 ms
Lead Channel Setting Pacing Pulse Width: 0.6 ms
Lead Channel Setting Sensing Sensitivity: 2 mV
Pulse Gen Model: 3222
Pulse Gen Serial Number: 7688750

## 2020-12-31 ENCOUNTER — Other Ambulatory Visit: Payer: Self-pay | Admitting: Cardiovascular Disease

## 2020-12-31 ENCOUNTER — Other Ambulatory Visit: Payer: Self-pay | Admitting: Adult Health

## 2020-12-31 DIAGNOSIS — F411 Generalized anxiety disorder: Secondary | ICD-10-CM

## 2021-01-01 LAB — HM COLONOSCOPY

## 2021-01-07 NOTE — Progress Notes (Signed)
Remote pacemaker transmission.   

## 2021-01-13 ENCOUNTER — Other Ambulatory Visit: Payer: Self-pay | Admitting: Obstetrics

## 2021-01-13 DIAGNOSIS — R928 Other abnormal and inconclusive findings on diagnostic imaging of breast: Secondary | ICD-10-CM

## 2021-01-27 NOTE — Progress Notes (Deleted)
Complete Physical  Assessment and Plan:  Encounter for Annual Physical Exam with abnormal findings Due annually  Health Maintenance reviewed Healthy lifestyle reviewed and goals set   Essential hypertension - continue medications, DASH diet, exercise and monitor at home. Call if greater than 130/80.  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 Chalmers Cater - discussed low sugars and awareness,   Hyperlipidemia associated with T2DM (HCC) -     Lipid panel -continue medications, LDL goal <70, check lipids, decrease fatty foods, increase activity.  - with weight loss may be able to get off of fenofibrate - pending labs   Overweight Excellent progress with 80 lb weight loss this past year - long discussion about weight loss, diet, and exercise  Other migraine without status migrainosus, not intractable Avoid triggers ***  Gastroesophageal reflux disease, esophagitis presence not specified Off of meds following weight loss; monitor   Diastolic dysfunction Continue weight loss  Biventricular cardiac pacemaker in situ Continue cardio follow up  Generalized anxiety disorder Continue meds  Medication management -     Magnesium  Vitamin D deficiency -      VITAMIN D 25 Hydroxy (Vit-D Deficiency, Fractures)   No orders of the defined types were placed in this encounter.   She declines q29m follow ups, agrees to schedule q2m at minimum ***  Discussed med's effects and SE's. Screening labs and tests as requested with regular follow-up as recommended. Future Appointments  Date Time Provider [place]  01/28/2021  3:00 PM Liane Comber, NP [facility] None  02/06/2021  1:50 PM [facility] DIAG [place]OMO 2 [facility]  02/06/2021  2:00 PM GI-BCG [place] 2 GI-[facility]  02/18/2021  4:20 PM Troy Sine, MD [facility]  03/31/2021  8:40 AM [facility] DEVICE REMOTES [facility]  06/30/2021  8:40 AM [facility] DEVICE REMOTES [facility]  02/02/2022  3:00 PM Liane Comber, NP [facility] None    HPI 65 y.o. female  presents for a complete physical. 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  She is married, 2 children, 5 grandchildren local, Still working doing retirement planning with employers, enjoys her job. Enjoys traveling to the beach.   She has OSA and is on CPAP, lost 80  lb and stopped wearing due to discomfort having to turn the pressure on the machine down, all sx have resolved, declines referral for retest.   Has history of migraines, infrequent, stress triggers,  excedrine migraine, maxalt works well for her. Also on imipramine daily for sleep which helps.   She follows with Dr. Ubaldo Glassing, has had several seb cyst around breast, monitoring for now.   She has hx of TAH, follows annually with [facility], she is on estrace, down from 5 mg to 1.5 mg, has tried to reduce further. ***  She has hx of anxiety, prescribed valium, takes 2.5 mg occasionally, 1-2 days a year, typically around the holidays.   BMI is There is no height or weight on file to calculate BMI., she has been working on diet and exercise. She is trying to get down to 160 lb on home scale, down from peak weight of 240 lb in 08/2018.  Wt Readings from Last 3 Encounters:  09/04/20 183 lb (83 kg)  06/09/20 175 lb (79.4 kg)  06/04/20 177 lb 6.4 oz (80.5 kg)   Her blood pressure has been controlled at home, today their BP is   She does workout, does fitness walk program inside.   She denies chest pain, shortness of breath, dizziness.   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 Lovena Le and Dr. Claiborne Billings.  Last echo was 02/2018 with EF 50-55%. She is on BB, ARB, spirolactone, and torsemide- recenlty decreased due to hypotension from intentional weight loss.  She has been working on diet and exercise for Diabetes, she is off insulin and metofrmin this year following 80 lb weight loss With CKD she is on ACE/ARB on cozaar 25mg .  With hyperlipidemia is on crestor 40 and fenofibrate  With neuropathy With CAD she is on bASA  She follows up with Dr. Chalmers Cater for her DM  She is off insulin at this time, off of metformin as well since *** Ozempic *** She see's Dr. Bing Plume for eye exams 01/2020 normal  denies hypoglycemia , polydipsia, polyuria and visual disturbances.  Does have paresthesias in bilateral feet/legs.   Last A1C was:  Lab Results  Component Value Date   HGBA1C 10.4 (H) 09/04/2020   Lab Results  Component Value Date    GFRNONAA 71 09/04/2020   Lab Results  Component Value Date   CHOL 154 09/04/2020   HDL 40 (L) 09/04/2020   LDLCALC 85 09/04/2020   TRIG 191 (H) 09/04/2020   CHOLHDL 3.9 09/04/2020   Patient is on Vitamin D supplement, unsure of dose, ? 2000 IU Lab Results  Component Value Date   VD25OH 37 09/04/2020      Lab Results  Component Value Date   [id number] 07/30/2019     Current Medications:  Current Outpatient Medications on File Prior to Visit  Medication Sig Dispense Refill   aspirin 81 MG tablet Take 162 mg by mouth at bedtime.      aspirin-acetaminophen-caffeine (EXCEDRIN MIGRAINE) 250-250-65 MG per tablet Take 2 tablets by mouth daily as needed for migraine.     carvedilol (COREG) 12.5 MG tablet TAKE 1 TABLET(12.5 MG) BY MOUTH TWICE DAILY 180 tablet 1   diazepam (VALIUM) 5 MG tablet Take 1/2 to 1 tablet at Bedtime ONLY if needed 30 tablet 0   estradiol (ESTRACE) 1 MG tablet Take 1.5 tablets by mouth daily.  4   fenofibrate micronized (LOFIBRA) 134 MG  capsule Take 1 capsule (134 mg total) by mouth daily before breakfast. 30 capsule 5   imipramine (TOFRANIL) 25 MG tablet TAKE 1 TO 2 TABLETS BY MOUTH AT BEDTIME AS NEEDED FOR SLEEP 180 tablet 0   loratadine (CLARITIN) 10 MG tablet Take 10 mg by mouth daily.     losartan (COZAAR) 25 MG tablet Take 1 tablet (25 mg total) by mouth daily. 30 tablet 5   meloxicam (MOBIC) 15 MG tablet Take one daily with food for 2 weeks, can take with tylenol, can not take with aleve, iburpofen, then as needed daily for pain 30 tablet 0   metFORMIN (GLUCOPHAGE XR) 500 MG 24 hr tablet Start taking 1 tab daily with largest meal of the day and slowly taper up to 4 tabs daily as tolerated, spread over meals. 120 tablet 2   Potassium 99 MG TABS Take 0.5 tablets (49.5 mg total) by mouth daily. 330 tablet    promethazine-dextromethorphan (PROMETHAZINE-DM) 6.25-15 MG/5ML syrup Take 1 tsp every 4 hours if needed for cough 240 mL 1   rizatriptan (MAXALT)  10 MG tablet Take 1 tablet for severe Migraine & May repeat in 2 hours if needed (Max 2 tabs /24 hours) 10 tablet 0   rosuvastatin (CRESTOR) 40 MG tablet Take 1 tablet (40 mg total) by mouth daily. TAKE 1 TABLET(40 MG) BY MOUTH DAILY. 30 tablet 5   Semaglutide,0.25 or 0.5MG /DOS, (OZEMPIC, 0.25 OR 0.5 MG/DOSE,) 2 MG/1.5ML SOPN START BY INJECTING 0.25 MG UNDER THE SKIN OF STOMACH ONCE WEEKLY; IF DOING WELL INCREASE TO 0.5 MG IN 4 WEEKS 1.5 mL 1   spironolactone (ALDACTONE) 25 MG tablet Take 1 tablet (25 mg total) by mouth 2 (two) times daily. 180 tablet 3   tamsulosin (FLOMAX) 0.4 MG CAPS capsule Take  1 capsule  Daily  for Bladder 90 capsule 1   valACYclovir (VALTREX) 500 MG tablet TAKE 1 TABLET(500 MG) BY MOUTH DAILY 90 tablet 0   verapamil (CALAN-SR) 240 MG CR tablet TAKE 1 TABLET(240 MG) BY MOUTH AT BEDTIME 30 tablet 5   VITAMIN D PO Take 2,000 Units by mouth.     No current facility-administered medications on file prior to visit.   Health Maintenance:   Immunization History  Administered Date(s) Administered   Influenza Inj Mdck Quad Pf 01/02/2019   Influenza Inj Mdck Quad With Preservative 11/22/2016, 12/22/2017, 01/29/2020   Influenza,inj,Quad PF,6+ Mos 12/06/2017   Influenza-Unspecified 12/06/2013, 11/22/2014, 12/06/2017   PFIZER(Purple Top)SARS-COV-2 Vaccination 05/25/2019, 06/15/2019   Tdap 03/24/2012   Tetanus: 2014 Pneumovax: declines *** Prevnar 13: due age 59 Flu vaccine: 2021, TODAY  Zostavax: N/A Covid 19: 2/2, 2021, pfizer  - she will send information   Pap/pelvic: Hx. Of TAH, Sept 2021 Dr. Chestine Spore  Normal, getting PAP every 3 years *** MGM: 12/2019 has done at GYN, was normal per patient  DEXA: 2006 t -1.3 , declines at this time- will get at 66  Colonoscopy: 11/09/2016, planning 5- 8 year follow up per patient, takes miralaxb EGD: Dec 2007  Last eye: [facility], Dr. Shawna Orleans  Last dental: Dr. Fredrik Cove, goes q2m, last 2021  Last derm: [facility], last 2020  Patient  Care Team: Lucky Cowboy, MD as PCP - General (Internal Medicine) Lennette Bihari, MD as PCP - Cardiology (Cardiology) Marinus Maw, MD as PCP - Electrophysiology (Cardiology) Lennette Bihari, MD as Consulting Physician (Cardiology) Fletcher Anon, MD as Consulting Physician (Pediatrics) Bernette Redbird, MD as Consulting Physician (Gastroenterology) Dorisann Frames, MD as  Consulting Physician (Endocrinology) Gus Height, MD (Inactive) as Consulting Physician (Obstetrics and Gynecology) Rolm Bookbinder, MD as Consulting Physician (Dermatology)   Medical History:  Past Medical History:  Diagnosis Date   Automatic implantable cardiac defibrillator in situ    [facility] [place]   CHF (congestive heart failure) ([place])    Diabetes mellitus    Hyperlipidemia    Hypertension 06/25/2011   Renal dopplers - superior mesenteric artery >50% diameter reduction; R renal artery - normal patency; L proximal renal artery 1-59% reduction (lower end of scale); both kidneys normal in size/symmetry with normal cortex and medulla   Leg pain 12/09/2010   doppler of R femoral artery - no evidence of dissection, AV fistula, pseudoaneurysm or other vascular abnormalities   Migraines    Obesity    PONV (postoperative nausea and vomiting)    Sleep apnea    on CPAP - AHI during sleep study was 44   Type II or unspecified type diabetes mellitus without mention of complication, not stated as uncontrolled    Type II or unspecified type diabetes mellitus without mention of complication, not stated as uncontrolled    Unspecified sleep apnea     Allergies  Allergen Reactions   Flagyl [Metronidazole] Anaphylaxis   Niacin Other (See Comments)   Niacin And Related Other (See Comments) and Cough    flushing   Penicillins Swelling and Rash    Has patient had a PCN reaction causing immediate rash, facial/tongue/throat swelling, SOB or lightheadedness with hypotension: No Has patient had a PCN reaction causing  severe rash involving mucus membranes or skin necrosis: No Has patient had a PCN reaction that required hospitalization No Has patient had a PCN reaction occurring within the last 10 years: Yes 3-4 years ago If all of the above answers are "NO", then may proceed with Cephalosporin use.   Yeast-Related Products Swelling and Rash     SURGICAL HISTORY She  has a past surgical history that includes Abdominal hysterectomy; Neck surgery; Pacemaker insertion; Cardiac defibrillator placement; Knee surgery (Right); doppler echocardiography (06/25/2011); Cardiovascular stress test (08/28/2010); Cardiac catheterization (07/15/2006); Biv pacemaker generator change out (N/A, 01/16/2014); and Extracorporeal shock wave lithotripsy (Left, 06/09/2020). FAMILY HISTORY Her family history includes Cancer - Cervical in her mother; Colon cancer in her maternal uncle; Colon cancer (age of onset: 82) in her maternal grandfather; Colon polyps in her mother; Hearing loss in her father; Heart attack in her father; Hypertension in her father. SOCIAL HISTORY She  reports that she quit smoking about 45 years ago. Her smoking use included cigarettes. She has never used smokeless tobacco. She reports that she does not currently use alcohol. She reports that she does not use drugs. Has not had alcohol in 8 months.   Review of Systems  Constitutional:  Negative for malaise/fatigue and weight loss.  HENT:  Negative for hearing loss and tinnitus.   Eyes:  Negative for blurred vision and double vision.  Respiratory:  Negative for cough, sputum production, shortness of breath and wheezing.   Cardiovascular:  Negative for chest pain, palpitations, orthopnea, claudication, leg swelling and PND.  Gastrointestinal:  Negative for abdominal pain, blood in stool, constipation, diarrhea, heartburn, melena, nausea and vomiting.  Genitourinary: Negative.   Musculoskeletal:  Negative for falls, joint pain and myalgias.  Skin:  Negative for rash.   Neurological:  Negative for dizziness, tingling, sensory change, weakness and headaches.  Endo/Heme/Allergies:  Negative for polydipsia.  Psychiatric/Behavioral: Negative.  Negative for depression, memory loss, substance abuse and  suicidal ideas. The patient is not nervous/anxious and does not have insomnia.   All other systems reviewed and are negative.  Physical Exam: Estimated body mass index is 28.66 kg/m as calculated from the following:   Height as of 09/04/20: 5\' 7"  (1.702 m).   Weight as of 09/04/20: 183 lb (83 kg). There were no vitals taken for this visit. General Appearance: Well nourished, in no apparent distress. Eyes: PERRLA, EOMs, conjunctiva no swelling or erythema Sinuses: No Frontal/maxillary tenderness ENT/Mouth: Ext aud canals clear, normal light reflex with TMs without erythema, bulging.  Good dentition. No erythema, swelling, or exudate on post pharynx. Tonsils not swollen or erythematous. Hearing normal.  Neck: Supple, thyroid normal. No bruits Respiratory: Respiratory effort normal, BS equal bilaterally without rales, rhonchi, wheezing or stridor. Cardio: RRR without murmurs, rubs or gallops. Brisk peripheral pulses without edema, with bilateral hard palpable cords without erythema, warmth, tenderness.  Chest: symmetric, with normal excursions and percussion. Breasts: defer to GYN Abdomen: obese, soft, nontender without organomegaly.   Lymphatics: Non tender without lymphadenopathy.  Genitourinary: defer Musculoskeletal: Full ROM all peripheral extremities,5/5 strength, and normal gait. Skin: Warm, dry without rashes, lesions, ecchymosis.  Neuro: Cranial nerves intact, reflexes equal bilaterally. Normal muscle tone, no cerebellar symptoms. Sensation decreased bilateral feet to 2/3 up shin.  Psych: Awake and oriented X 3, normal affect, Insight and Judgment appropriate.   EKG- has checked by [facility] cardiology, follows annually, defer  Izora Ribas, NP 8:46  AM [facility]

## 2021-01-28 ENCOUNTER — Other Ambulatory Visit: Payer: Self-pay

## 2021-01-28 ENCOUNTER — Encounter: Payer: BC Managed Care – PPO | Admitting: Adult Health

## 2021-01-28 ENCOUNTER — Encounter: Payer: Self-pay | Admitting: Adult Health

## 2021-01-28 ENCOUNTER — Ambulatory Visit (INDEPENDENT_AMBULATORY_CARE_PROVIDER_SITE_OTHER): Payer: BC Managed Care – PPO | Admitting: Adult Health

## 2021-01-28 VITALS — BP 136/82 | HR 69 | Temp 97.9°F | Resp 16 | Ht 67.0 in | Wt 187.6 lb

## 2021-01-28 DIAGNOSIS — Z131 Encounter for screening for diabetes mellitus: Secondary | ICD-10-CM | POA: Diagnosis not present

## 2021-01-28 DIAGNOSIS — E663 Overweight: Secondary | ICD-10-CM

## 2021-01-28 DIAGNOSIS — E1143 Type 2 diabetes mellitus with diabetic autonomic (poly)neuropathy: Secondary | ICD-10-CM

## 2021-01-28 DIAGNOSIS — I5022 Chronic systolic (congestive) heart failure: Secondary | ICD-10-CM

## 2021-01-28 DIAGNOSIS — I428 Other cardiomyopathies: Secondary | ICD-10-CM

## 2021-01-28 DIAGNOSIS — I700 Atherosclerosis of aorta: Secondary | ICD-10-CM

## 2021-01-28 DIAGNOSIS — E1122 Type 2 diabetes mellitus with diabetic chronic kidney disease: Secondary | ICD-10-CM

## 2021-01-28 DIAGNOSIS — G43809 Other migraine, not intractable, without status migrainosus: Secondary | ICD-10-CM

## 2021-01-28 DIAGNOSIS — E1169 Type 2 diabetes mellitus with other specified complication: Secondary | ICD-10-CM

## 2021-01-28 DIAGNOSIS — Z79899 Other long term (current) drug therapy: Secondary | ICD-10-CM

## 2021-01-28 DIAGNOSIS — Z1389 Encounter for screening for other disorder: Secondary | ICD-10-CM

## 2021-01-28 DIAGNOSIS — N182 Chronic kidney disease, stage 2 (mild): Secondary | ICD-10-CM

## 2021-01-28 DIAGNOSIS — E559 Vitamin D deficiency, unspecified: Secondary | ICD-10-CM

## 2021-01-28 DIAGNOSIS — Z0001 Encounter for general adult medical examination with abnormal findings: Secondary | ICD-10-CM

## 2021-01-28 DIAGNOSIS — I10 Essential (primary) hypertension: Secondary | ICD-10-CM

## 2021-01-28 DIAGNOSIS — G4733 Obstructive sleep apnea (adult) (pediatric): Secondary | ICD-10-CM

## 2021-01-28 DIAGNOSIS — Z0000 Encounter for general adult medical examination without abnormal findings: Secondary | ICD-10-CM

## 2021-01-28 DIAGNOSIS — F411 Generalized anxiety disorder: Secondary | ICD-10-CM

## 2021-01-28 DIAGNOSIS — Z1329 Encounter for screening for other suspected endocrine disorder: Secondary | ICD-10-CM

## 2021-01-28 DIAGNOSIS — Z13220 Encounter for screening for lipoid disorders: Secondary | ICD-10-CM | POA: Diagnosis not present

## 2021-01-28 DIAGNOSIS — E785 Hyperlipidemia, unspecified: Secondary | ICD-10-CM

## 2021-01-28 DIAGNOSIS — K219 Gastro-esophageal reflux disease without esophagitis: Secondary | ICD-10-CM

## 2021-01-28 MED ORDER — METFORMIN HCL ER 500 MG PO TB24: ORAL_TABLET | ORAL | 3 refills | Status: DC

## 2021-01-28 MED ORDER — SEMAGLUTIDE (1 MG/DOSE) 4 MG/3ML ~~LOC~~ SOPN: 1.0000 mg | PEN_INJECTOR | SUBCUTANEOUS | 1 refills | Status: DC

## 2021-01-28 MED ORDER — DIAZEPAM 5 MG PO TABS: ORAL_TABLET | ORAL | 0 refills | Status: AC

## 2021-01-28 NOTE — Progress Notes (Signed)
Complete Physical  Assessment and Plan:   Encounter for Annual Physical Exam with abnormal findings Due annually  Health Maintenance reviewed Healthy lifestyle reviewed and goals set Continue GYN follow up   Essential hypertension - continue medications, DASH diet, exercise and monitor at home. Call if greater than 130/80.  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 Talmage Nap following weight loss; stress and weight gain, now on ozempic, increase to to 1mg /week and restart metformin recommended - working on lifestyle to get back off meds if possible  - discussed low sugars and awareness,  -     Hemoglobin A1c  Hyperlipidemia associated with T2DM (HCC) -     Lipid panel -continue medications, LDL goal <70, check lipids, decrease fatty foods, increase activity.   Overweight- BMI 29 Excellent progress with 80 lb weight loss this past year - long discussion about weight loss, diet, and exercise - stress management - low processed diet - increase ozempic to 1 mg/week,   Other migraine without status migrainosus, not intractable Avoid triggers  Gastroesophageal reflux disease, esophagitis presence not specified Off of meds following weight loss; monitor   Diastolic dysfunction Continue weight  loss  Biventricular cardiac pacemaker in situ Continue cardio follow up  Generalized anxiety disorder Continue meds  Medication management -     Magnesium  Vitamin D deficiency -     VITAMIN D 25 Hydroxy (Vit-D Deficiency, Fractures)  B12 def - borderline, discussed, she plans to start SL supplement; defer recheck today, plan check at follow up    Orders Placed This Encounter  Procedures   CBC with Differential/Platelet   COMPLETE METABOLIC PANEL WITH GFR   Magnesium   Lipid panel   TSH   Hemoglobin A1c   VITAMIN D 25 Hydroxy (Vit-D Deficiency, Fractures)   Microalbumin / creatinine urine ratio   HM DIABETES FOOT EXAM   Plan to see back in 3 months -   Discussed med's effects and SE's. Screening labs and tests as requested with regular follow-up as recommended. Future Appointments  Date Time Provider Department Center  02/06/2021  1:50 PM [facility] DIAG [facility] 2 [facility]  02/06/2021  2:00 PM [facility] 14/04/2020 2 [facility]  02/18/2021  4:20 PM 02/20/2021, MD [facility]  03/31/2021  8:40 AM [facility] DEVICE REMOTES [facility]  06/30/2021  8:40 AM [facility] DEVICE REMOTES [facility]  02/02/2022  3:00 PM 02/04/2022, NP [facility] None    HPI 65 y.o. female  presents for a complete physical. 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  She is married, 2 children, 5 grandchildren local, Still working doing retirement planning with employers, enjoys her job.  Enjoys traveling to the beach.   She has OSA and is on CPAP, lost 80 lb and hasn't been wearing in recent weeks due to discomfort having to turn the pressure on the machine down, all sx have resolve, declines referral for retest.   Has history of migraines, infrequent, stress triggers, excedrine migraine, maxalt works well for her. Also on imipramine daily for sleep which helps.   She follows with Dr. Ubaldo Glassing, has had several seb cyst around breast, monitoring for now.   She has hx of TAH, follows annually with [facility], she is on estrace, down from 5 mg to 1.5 mg, has tried to reduce further. Has had mammogram for this year early Nov 2022, had recall, pending diagnostic.   She has hx of anxiety, prescribed valium, takes 2.5 mg occasionally, 1-2 days a year, typically around the holidays.   Had lithotripsy by Dr. Abner Greenspan 06/2020, no issues since.   BMI is Body mass index is 29.38 kg/m., she has been working on diet and exercise. She is trying to get down to 160 lb on home scale, down from peak weight of 240 lb in 08/2018. She reports has fallen off of the wagon, has been craving more with stress, working to get back to previous good habits.  Wt Readings from Last 3 Encounters:  01/28/21 187 lb 9.6 oz (85.1 kg)  09/04/20 183 lb (83 kg)  06/09/20 175 lb (79.4 kg)   Her blood pressure has been controlled at home, today their BP is BP: 136/82 She does workout, does fitness walk program inside.   She denies chest pain, shortness of breath, dizziness.   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 Lovena Le and Dr. Claiborne Billings.  Last echo was 02/2018 with EF 50-55%. She is on BB, ARB, spirolactone, and torsemide. Has follow up in 2 weeks.   She has been working on  diet and exercise for Diabetes, she was off insulin and metformin following 80 lb weight loss With CKD she is on ACE/ARB on cozaar 25mg .  With hyperlipidemia is on crestor 40 and fenofibrate  With neuropathy in the past, but improved  With CAD she is on bASA  She no longer follows up with Dr. Chalmers Cater for her DM  Now on ozempic due to weights trending back up, has metformin but hasn't been taking, just back on ozempic due to refill issues Recently fasting reported around 150, up from 120s average She see's Dr. Bing Plume for eye exams 01/2020 normal, has scheduled 04/2021 denies hypoglycemia , polydipsia, polyuria and visual disturbances.  Last A1C was:  Lab Results  Component Value Date   HGBA1C 10.4 (H) 09/04/2020   Lab Results  Component Value Date   GFRNONAA 71 09/04/2020   Lab Results  Component Value Date   CHOL 154 09/04/2020   HDL 40 (L) 09/04/2020   LDLCALC 85 09/04/2020   TRIG 191 (H) 09/04/2020   CHOLHDL 3.9 09/04/2020   Patient is on Vitamin D supplement, unsure of dose, ? 4000 IU  Lab Results  Component Value Date   VD25OH 37 09/04/2020   Not currently on supplement; receptive to starting SL- Lab Results  Component Value Date   [id number] 07/30/2019      Current Medications:  Current Outpatient Medications on File Prior to Visit  Medication Sig Dispense Refill   aspirin 81 MG tablet Take 162 mg by mouth at bedtime.      aspirin-acetaminophen-caffeine (EXCEDRIN MIGRAINE) 250-250-65 MG per tablet Take 2 tablets by mouth daily as needed for migraine.     carvedilol (COREG) 12.5 MG tablet TAKE 1 TABLET(12.5 MG) BY MOUTH TWICE DAILY 180 tablet 1   estradiol (ESTRACE) 1 MG tablet Take 1.5 tablets by mouth daily.  4   fenofibrate micronized (LOFIBRA) 134 MG capsule Take 1 capsule (134 mg total) by mouth daily before breakfast. 30 capsule 5   imipramine (TOFRANIL) 25 MG tablet TAKE 1 TO 2 TABLETS BY MOUTH AT BEDTIME AS NEEDED FOR SLEEP 180 tablet 0   loratadine  (CLARITIN) 10 MG tablet Take 10 mg by mouth daily.     Potassium 99 MG TABS Take 0.5 tablets (49.5 mg total) by mouth daily. 330 tablet    rizatriptan (MAXALT) 10 MG tablet Take 1 tablet for severe Migraine & May repeat in 2 hours if needed (Max 2 tabs /24 hours) 10 tablet 0   rosuvastatin (CRESTOR) 40 MG tablet Take 1 tablet (40 mg total) by mouth daily. TAKE 1 TABLET(40 MG) BY MOUTH DAILY. 30 tablet 5   spironolactone (ALDACTONE) 25 MG tablet Take 1 tablet (25 mg total) by mouth 2 (two) times daily. 180 tablet 3   tamsulosin (FLOMAX) 0.4 MG CAPS capsule Take  1 capsule  Daily  for Bladder 90 capsule 1   valACYclovir (VALTREX) 500 MG tablet TAKE 1 TABLET(500 MG) BY MOUTH DAILY 90 tablet 0   verapamil (CALAN-SR) 240 MG CR tablet TAKE 1 TABLET(240 MG) BY MOUTH AT BEDTIME 30 tablet 5   VITAMIN D PO Take 2,000 Units by mouth.     losartan (COZAAR) 25 MG tablet Take 1 tablet (25 mg total) by mouth daily. 30 tablet 5   meloxicam (MOBIC) 15 MG tablet Take one daily with food for 2 weeks, can take with tylenol, can not take with aleve, iburpofen, then as needed daily for pain (Patient not taking: Reported on 01/28/2021) 30 tablet 0   promethazine-dextromethorphan (PROMETHAZINE-DM) 6.25-15 MG/5ML syrup Take 1 tsp every 4 hours if needed for cough (Patient not taking: Reported on 01/28/2021) 240 mL 1   No current facility-administered medications on file prior to visit.   Health Maintenance:   Immunization History  Administered Date(s) Administered   Influenza Inj Mdck Quad Pf 01/02/2019   Influenza Inj Mdck Quad With Preservative 11/22/2016, 12/22/2017, 01/29/2020   Influenza,inj,Quad PF,6+ Mos 12/06/2017   Influenza-Unspecified 12/06/2013, 11/22/2014, 12/06/2017, 12/19/2020   Moderna SARS-COV2 Booster Vaccination 12/27/2020   PFIZER(Purple Top)SARS-COV-2 Vaccination 05/25/2019, 06/15/2019   Tdap 03/24/2012   Tetanus: 2014 Pneumovax: declines Prevnar 13: due age 39 Flu vaccine:  12/2020 Shingrix: declines Covid 19: 2/2, 2021, pfizer  - booster  12/2020, moderna  Pap/pelvic: Hx. Of TAH, Sept 2021 Dr. Carlis Abbott  Normal, getting PAP every 3 years MGM: 12/2020 has done at GYN, recall pending diagnostic  DEXA: 2006 t -1.3 , declines at this time- will get at 66  Colonoscopy: 11/09/2016, just had repeat 12/24/2020, had polyp, report requested EGD: Dec 2007  Last eye: [facility], Dr. Sandre Kitty, has scheduled 04/2021 Last dental: Dr.  Millsaps, goes q78m, last 2022  Last derm: [place], last 2020, getting new provider in that office   Patient Care Team: Unk Pinto, MD as PCP - General (Internal Medicine) Troy Sine, MD as PCP - Cardiology (Cardiology) Evans Lance, MD as PCP - Electrophysiology (Cardiology) Troy Sine, MD as Consulting Physician (Cardiology) Charlies Silvers, MD as Consulting Physician (Pediatrics) Ronald Lobo, MD as Consulting Physician (Gastroenterology) Jacelyn Pi, MD as Consulting Physician (Endocrinology) Gus Height, MD (Inactive) as Consulting Physician (Obstetrics and Gynecology) Rolm Bookbinder, MD as Consulting Physician (Dermatology)   Medical History:  Past Medical History:  Diagnosis Date   Automatic implantable cardiac defibrillator in situ    [facility] [place]   CHF (congestive heart failure) ([place])    Diabetes mellitus    Hyperlipidemia    Hypertension 06/25/2011   Renal dopplers - superior mesenteric artery >50% diameter reduction; R renal artery - normal patency; L proximal renal artery 1-59% reduction (lower end of scale); both kidneys normal in size/symmetry with normal cortex and medulla   Leg pain 12/09/2010   doppler of R femoral artery - no evidence of dissection, AV fistula, pseudoaneurysm or other vascular abnormalities   Migraines    Obesity    PONV (postoperative nausea and vomiting)    Sleep apnea    on CPAP - AHI during sleep study was 44   Type II or unspecified type diabetes mellitus without  mention of complication, not stated as uncontrolled    Type II or unspecified type diabetes mellitus without mention of complication, not stated as uncontrolled    Unspecified sleep apnea     Allergies  Allergen Reactions   Flagyl [Metronidazole] Anaphylaxis   Niacin Other (See Comments)   Niacin And Related Other (See Comments) and Cough    flushing   Penicillins Swelling and Rash    Has patient had a PCN reaction causing immediate rash, facial/tongue/throat swelling, SOB or lightheadedness with hypotension: No Has patient had a PCN reaction causing severe rash involving mucus membranes or skin necrosis: No Has patient had a PCN reaction that required hospitalization No Has patient had a PCN reaction occurring within the last 10 years: Yes 3-4 years ago If all of the above answers are "NO", then may proceed with Cephalosporin use.   Yeast-Related Products Swelling and Rash     SURGICAL HISTORY She  has a past surgical history that includes Abdominal hysterectomy; Neck surgery; Pacemaker insertion; Cardiac defibrillator placement; Knee surgery (Right); doppler echocardiography (06/25/2011); Cardiovascular stress test (08/28/2010); Cardiac catheterization (07/15/2006); Biv pacemaker generator change out (N/A, 01/16/2014); and Extracorporeal shock wave lithotripsy (Left, 06/09/2020). FAMILY HISTORY Her family history includes Cancer - Cervical in her mother; Colon cancer in her maternal uncle; Colon cancer (age of onset: 36) in her maternal grandfather; Colon polyps in her mother; Hearing loss in her father; Hypertension in her father; Valvular heart disease in her father. SOCIAL HISTORY She  reports that she quit smoking about 45 years ago. Her smoking use included cigarettes. She has never used smokeless tobacco. She reports that she does not currently use alcohol. She reports that she does not use drugs. Has not had alcohol in 8 months.   Review of Systems  Constitutional:  Negative for  malaise/fatigue and weight loss.  HENT:  Negative for hearing loss and tinnitus.   Eyes:  Negative for blurred vision and double vision.  Respiratory:  Negative for cough, sputum production, shortness of breath and wheezing.   Cardiovascular:  Negative  for chest pain, palpitations, orthopnea, claudication, leg swelling and PND.  Gastrointestinal:  Negative for abdominal pain, blood in stool, constipation, diarrhea, heartburn, melena, nausea and vomiting.  Genitourinary: Negative.   Musculoskeletal:  Negative for falls, joint pain and myalgias.  Skin:  Negative for rash.  Neurological:  Negative for dizziness, tingling, sensory change, weakness and headaches.  Endo/Heme/Allergies:  Negative for polydipsia.  Psychiatric/Behavioral: Negative.  Negative for depression, memory loss, substance abuse and suicidal ideas. The patient is not nervous/anxious and does not have insomnia.   All other systems reviewed and are negative.  Physical Exam: Estimated body mass index is 29.38 kg/m as calculated from the following:   Height as of this encounter: 5\' 7"  (1.702 m).   Weight as of this encounter: 187 lb 9.6 oz (85.1 kg). BP 136/82   Pulse 69   Temp 97.9 F (36.6 C)   Resp 16   Ht 5\' 7"  (1.702 m)   Wt 187 lb 9.6 oz (85.1 kg)   SpO2 98%   BMI 29.38 kg/m  General Appearance: Well nourished, in no apparent distress. Eyes: PERRLA, EOMs, conjunctiva no swelling or erythema Sinuses: No Frontal/maxillary tenderness ENT/Mouth: Ext aud canals clear, normal light reflex with TMs without erythema, bulging.  Good dentition. No erythema, swelling, or exudate on post pharynx. Tonsils not swollen or erythematous. Hearing normal.  Neck: Supple, thyroid normal. No bruits Respiratory: Respiratory effort normal, BS equal bilaterally without rales, rhonchi, wheezing or stridor. Cardio: RRR without murmurs, rubs or gallops. Brisk peripheral pulses without edema, with bilateral hard palpable cords without  erythema, warmth, tenderness.  Chest: symmetric, with normal excursions and percussion. Breasts: defer to GYN Abdomen: soft, nontender without organomegaly or mass  Lymphatics: Non tender without lymphadenopathy.  Genitourinary: defer to GYN Musculoskeletal: Full ROM all peripheral extremities,5/5 strength, and normal gait. Skin: Warm, dry without rashes, ecchymosis. She has extensive cherry angiomas to chest and abdomen. Neuro: Cranial nerves intact, reflexes equal bilaterally. Normal muscle tone, no cerebellar symptoms. Sensation intact bil feet to monofilament Psych: Awake and oriented X 3, normal affect, Insight and Judgment appropriate.   EKG- Gets by cardiology annually; defer  Izora Ribas, NP 3:21 PM [facility]

## 2021-01-28 NOTE — Patient Instructions (Addendum)
[name] , Thank you for taking time to come for your Annual Wellness Visit. I appreciate your ongoing commitment to your health goals. Please review the following plan we discussed and let me know if I can assist you in the future.   These are the goals we discussed:  Goals      Fasting glucose <100     HEMOGLOBIN A1C < 5.7     Weight (lb) < 160 lb (72.6 kg)        This is a list of the screening recommended for you and due dates:  Health Maintenance  Topic Date Due   COVID-19 Vaccine (3 - Pfizer risk series) 01/24/2021   Urine Protein Check  01/28/2021   Pap Smear  01/28/2021*   Zoster (Shingles) Vaccine (1 of 2) 04/30/2021*   Pneumococcal Vaccination (1 - PCV) 01/28/2022*   Eye exam for diabetics  02/04/2021   Hemoglobin A1C  03/06/2021   Mammogram  12/31/2021   Complete foot exam   01/28/2022   Tetanus Vaccine  03/24/2022   Colon Cancer Screening  11/10/2026   Flu Shot  Completed   Hepatitis C Screening: USPSTF Recommendation to screen - Ages 18-79 yo.  Completed   HIV Screening  Completed   HPV Vaccine  Aged Out  *Topic was postponed. The date shown is not the original due date.    Recommend using fasting glucose as your guide; increase ozempic to 1 mg/week, then add metformin with slow taper up until fasting glucose is <100     Know what a healthy weight is for you (roughly BMI <25) and aim to maintain this  Aim for 7+ servings of fruits and vegetables daily  65-80+ fluid ounces of water or unsweet tea for healthy kidneys  Limit to max 1 drink of alcohol per day; avoid smoking/tobacco  Limit animal fats in diet for cholesterol and heart health - choose grass fed whenever available  Avoid highly processed foods, and foods high in saturated/trans fats  Aim for low stress - take time to unwind and care for your mental health  Aim for 150 min of moderate intensity exercise weekly for heart health, and weights twice weekly for bone health  Aim for 7-9 hours  of sleep daily      Antiinflammatory diet  Doctors are learning that one of the best ways to reduce inflammation lies not in the medicine cabinet, but in the refrigerator.  By following an anti-inflammatory diet you can fight off inflammation for good.  What does an anti-inflammatory diet do? Your immune system becomes activated when your body recognizes anything that is foreign--such as an invading microbe, plant pollen, or chemical. This often triggers a process called inflammation. Intermittent bouts of inflammation directed at truly threatening invaders protect your health.  However, sometimes inflammation persists, day in and day out, even when you are not threatened by a foreign invader. That's when inflammation can become your enemy. Many major diseases that plague us--including cancer, heart disease, diabetes, arthritis, depression, and Alzheimer's--have been linked to chronic inflammation.  One of the most powerful tools to combat inflammation comes not from the pharmacy, but from the grocery store.   Protect yourself from the damage of chronic inflammation. Science has proven that chronic, low-grade inflammation can turn into a silent killer that contributes to cardiovascular disease, cancer, type 2 diabetes and other conditions.   Choose the right anti-inflammatory foods, and you may be able to reduce your risk of illness. Consistently pick the  wrong ones, and you could accelerate the inflammatory disease process.     Foods that cause inflammation Try to avoid or limit these foods as much as possible:  refined carbohydrates, such as white bread and pastries [place] fries and other fried foods soda and other sugar-sweetened beverages High fructose corn syrup red meat (burgers, steaks) and processed meat (hot dogs, sausage) margarine, shortening, and lard  The health risks of inflammatory foods Not surprisingly, the same foods on an inflammation diet are generally considered  bad for our health, including sodas and refined carbohydrates, as well as red meat and processed meats.  "Some of the foods that have been associated with an increased risk for chronic diseases such as type 2 diabetes and heart disease are also associated with excess inflammation," Dr. Si Gaul says. "It's not surprising, since inflammation is an important underlying mechanism for the development of these diseases."  Unhealthy foods also contribute to weight gain, which is itself a risk factor for inflammation. Yet in several studies, even after researchers took obesity into account, the link between foods and inflammation remained, which suggests weight gain isn't the sole driver. "Some of the food components or ingredients may have independent effects on inflammation over and above increased caloric intake," Dr. Si Gaul says.  Anti-inflammatory foods An anti-inflammatory diet should include these foods:  tomatoes olive oil green leafy vegetables, such as spinach, kale, and collards nuts like almonds and walnuts fatty fish like salmon, mackerel, tuna, and sardines fruits such as strawberries, blueberries, cherries, and oranges  Benefits of anti-inflammatory foods On the flip side are beverages and foods that reduce inflammation, in particular fruits and vegetables such as blueberries, apples, and leafy greens that are high in natural antioxidants and polyphenols--protective compounds found in plants.  Studies have also associated nuts with reduced markers of inflammation and a lower risk of cardiovascular disease and diabetes. Coffee, which contains polyphenols and other anti-inflammatory compounds, may protect against inflammation, as well.  Anti-inflammatory diet To reduce levels of inflammation, aim for an overall healthy diet. If you're looking for an eating plan that closely follows the tenets of anti-inflammatory eating, consider the Mediterranean diet, which is high in fruits, vegetables, nuts,  whole grains, fish, and healthy oils.  In addition to lowering inflammation, a more natural, less processed diet can have noticeable effects on your physical and emotional health and overall quality of life.   [email or web address]

## 2021-01-29 LAB — LIPID PANEL
Cholesterol: 125 mg/dL (ref <200)
HDL: 37 mg/dL — ABNORMAL LOW (ref >=50)
LDL Cholesterol (Calc): 67 mg/dL (calc)
Non-HDL Cholesterol (Calc): 88 mg/dL (calc) (ref <130)
Total CHOL/HDL Ratio: 3.4 (calc) (ref <5.0)
Triglycerides: 128 mg/dL (ref <150)

## 2021-01-29 LAB — CBC WITH DIFFERENTIAL/PLATELET
Absolute Monocytes: 518 cells/uL (ref 200–950)
Basophils Absolute: 83 cells/uL (ref 0–200)
Basophils Relative: 1.2 %
Eosinophils Absolute: 262 cells/uL (ref 15–500)
Eosinophils Relative: 3.8 %
HCT: 40.9 % (ref 35.0–45.0)
Hemoglobin: 13.6 g/dL (ref 11.7–15.5)
Lymphs Abs: 2463 cells/uL (ref 850–3900)
MCH: 28.8 pg (ref 27.0–33.0)
MCHC: 33.3 g/dL (ref 32.0–36.0)
MCV: 86.5 fL (ref 80.0–100.0)
MPV: 10.3 fL (ref 7.5–12.5)
Monocytes Relative: 7.5 %
Neutro Abs: 3574 cells/uL (ref 1500–7800)
Neutrophils Relative %: 51.8 %
Platelets: 299 10*3/uL (ref 140–400)
RBC: 4.73 10*6/uL (ref 3.80–5.10)
RDW: 13.9 % (ref 11.0–15.0)
Total Lymphocyte: 35.7 %
WBC: 6.9 10*3/uL (ref 3.8–10.8)

## 2021-01-29 LAB — HEMOGLOBIN A1C
Hgb A1c MFr Bld: 8.5 % of total Hgb — ABNORMAL HIGH (ref <5.7)
Mean Plasma Glucose: 197 mg/dL
eAG (mmol/L): 10.9 mmol/L

## 2021-01-29 LAB — COMPLETE METABOLIC PANEL WITH GFR
AG Ratio: 1.8 (calc) (ref 1.0–2.5)
ALT: 15 U/L (ref 6–29)
AST: 19 U/L (ref 10–35)
Albumin: 4.4 g/dL (ref 3.6–5.1)
Alkaline phosphatase (APISO): 54 U/L (ref 37–153)
BUN: 12 mg/dL (ref 7–25)
CO2: 32 mmol/L (ref 20–32)
Calcium: 10.2 mg/dL (ref 8.6–10.4)
Chloride: 105 mmol/L (ref 98–110)
Creat: 0.7 mg/dL (ref 0.50–1.05)
Globulin: 2.5 g/dL (calc) (ref 1.9–3.7)
Glucose, Bld: 144 mg/dL — ABNORMAL HIGH (ref 65–99)
Potassium: 3.9 mmol/L (ref 3.5–5.3)
Sodium: 143 mmol/L (ref 135–146)
Total Bilirubin: 0.4 mg/dL (ref 0.2–1.2)
Total Protein: 6.9 g/dL (ref 6.1–8.1)
eGFR: 97 mL/min/{1.73_m2} (ref >=60)

## 2021-01-29 LAB — MICROALBUMIN / CREATININE URINE RATIO
Creatinine, Urine: 60 mg/dL (ref 20–275)
Microalb Creat Ratio: 10 mcg/mg creat (ref <30)
Microalb, Ur: 0.6 mg/dL

## 2021-01-29 LAB — MAGNESIUM: Magnesium: 1.5 mg/dL (ref 1.5–2.5)

## 2021-01-29 LAB — TSH: TSH: 1.09 mIU/L (ref 0.40–4.50)

## 2021-01-29 LAB — VITAMIN D 25 HYDROXY (VIT D DEFICIENCY, FRACTURES): Vit D, 25-Hydroxy: 41 ng/mL (ref 30–100)

## 2021-01-31 ENCOUNTER — Encounter: Payer: Self-pay | Admitting: Adult Health

## 2021-02-04 ENCOUNTER — Other Ambulatory Visit: Payer: Self-pay | Admitting: Internal Medicine

## 2021-02-04 DIAGNOSIS — R519 Headache, unspecified: Secondary | ICD-10-CM

## 2021-02-06 ENCOUNTER — Ambulatory Visit
Admission: RE | Admit: 2021-02-06 | Discharge: 2021-02-06 | Disposition: A | Discharge status: Home / Self Care | Payer: BC Managed Care – PPO | Source: Ambulatory Visit | Attending: Obstetrics | Admitting: Obstetrics

## 2021-02-06 ENCOUNTER — Other Ambulatory Visit: Payer: Self-pay | Admitting: Obstetrics

## 2021-02-06 DIAGNOSIS — R928 Other abnormal and inconclusive findings on diagnostic imaging of breast: Secondary | ICD-10-CM

## 2021-02-06 DIAGNOSIS — N6489 Other specified disorders of breast: Secondary | ICD-10-CM

## 2021-02-09 ENCOUNTER — Encounter: Payer: Self-pay | Admitting: Adult Health

## 2021-02-10 ENCOUNTER — Other Ambulatory Visit: Payer: Self-pay | Admitting: Adult Health

## 2021-02-16 ENCOUNTER — Encounter: Payer: Self-pay | Admitting: Internal Medicine

## 2021-02-18 ENCOUNTER — Ambulatory Visit (INDEPENDENT_AMBULATORY_CARE_PROVIDER_SITE_OTHER): Payer: BC Managed Care – PPO | Admitting: Cardiovascular Disease

## 2021-02-18 ENCOUNTER — Encounter: Payer: Self-pay | Admitting: Cardiovascular Disease

## 2021-02-18 ENCOUNTER — Other Ambulatory Visit: Payer: Self-pay

## 2021-02-18 VITALS — BP 138/76 | HR 81 | Ht 67.0 in | Wt 191.0 lb

## 2021-02-18 DIAGNOSIS — E1169 Type 2 diabetes mellitus with other specified complication: Secondary | ICD-10-CM

## 2021-02-18 DIAGNOSIS — G4733 Obstructive sleep apnea (adult) (pediatric): Secondary | ICD-10-CM

## 2021-02-18 DIAGNOSIS — E785 Hyperlipidemia, unspecified: Secondary | ICD-10-CM

## 2021-02-18 DIAGNOSIS — Z950 Presence of cardiac pacemaker: Secondary | ICD-10-CM

## 2021-02-18 DIAGNOSIS — Z87442 Personal history of urinary calculi: Secondary | ICD-10-CM

## 2021-02-18 DIAGNOSIS — I428 Other cardiomyopathies: Secondary | ICD-10-CM

## 2021-02-18 NOTE — Patient Instructions (Signed)
Medication Instructions:  The current medical regimen is effective;  continue present plan and medications.  *If you need a refill on your cardiac medications before your next appointment, please call your pharmacy*   Testing/Procedures: Echocardiogram (6 months)  - Your physician has requested that you have an echocardiogram. Echocardiography is a painless test that uses sound waves to create images of your heart. It provides your doctor with information about the size and shape of your heart and how well your hearts chambers and valves are working. This procedure takes approximately one hour. There are no restrictions for this procedure. This will be performed at either our [facility] location - [address]  -or- [facility] location [facility] 2nd floor.    Follow-Up: At [facility], you and your health needs are our priority.  As part of our continuing mission to provide you with exceptional heart care, we have created designated Provider Care Teams.  These Care Teams include your primary Cardiologist (physician) and Advanced Practice Providers (APPs -  Physician Assistants and Nurse Practitioners) who all work together to provide you with the care you need, when you need it.  We recommend signing up for the patient portal called "MyChart".  Sign up information is provided on this After Visit Summary.  MyChart is used to connect with patients for Virtual Visits (Telemedicine).  Patients are able to view lab/test results, encounter notes, upcoming appointments, etc.  Non-urgent messages can be sent to your provider as well.   To learn more about what you can do with MyChart, go to [email or web address].    Your next appointment:   6 month(s)  The format for your next appointment:   In Person  Provider:   Nicki Guadalajara, MD

## 2021-02-18 NOTE — Progress Notes (Signed)
Patient ID: [name], female   DOB: [birth year], 65 y.o.   MRN: [id number]      HPI: [name] is a 65 y.o. female who presents to the office for a follow-up cardiology evaluation.  I last saw her in July 2019.  [name] has a history of a nonischemic cardiomyopathy. Initial ejection fraction was in the range of 25-30%. She is status post biventricular ICD implantation by Dr. Lovena Le. She also has a history of obstructive sleep apnea on CPAP therapy admits to being 100% compliant with usage. On her last  echo in April 2013 her ejection fraction has essentially normalized and was felt to be greater than 55%.  She did have diastolic dysfunction. There is evidence for mild aortic valve sclerosis. 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  She underwent a pacemaker generator change out by Dr. Lovena Le on 01/16/2014 and with her normalization of LV function, she had a downgrade from an ICD to a by bi-v permanent pacemaker.  She saw Dr. Lovena Le in follow-up in February 2016 at which time she had mild worsening of her heart failure symptoms with a change from class I.  The class IIb felt predominantly due to dietary and medical noncompliance.  She was advised to take torsemide twice a day until her weight and volume overload improved  I saw her in April 2018.  At that time she was under increased stress.   In January 2018, she saw Dr. Lovena Le at that time admitted to some increasing shortness of breath with exertion.  On 05/07/2016 a follow-up echo Doppler study which showed an EF of 55-60%. She did not have regional wall motion abnormalities.  However, there was grade 2 diastolic dysfunction.  ECG at that time showed normal sinus rhythm with biventricular pacing.  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  She was using CPAP with 100% compliance.  Her DME company is [employer].    When I last saw her on September 05, 2017 she admitted to losing approximately 36 pounds over the prior 49-month  But had gained 10 of those pounds back.  She denied any episodes of chest pain or edema.  She saw Dr. TLovena Lefor one year evaluation in January 2019.  She initially had undergone biventricular ICD implantation for a nonischemic cardiomyopathy but with subsequent normalization of left ventricular function she underwent a downgrade to her to a biventricular permanent pacemaker in 2015. She continued to use CPAP with 100% compliance and is on her second machine since CPAP initiation.  She had developed urologic symptoms and has been under evaluation by Dr. BGloriann Loanfor hematuria with planned cystoscopy and CT imaging.    Since I last saw her, she has been evaluated by HHendricks Milo PA in December 2019 at which time she was having increased palpitations.  At that time her verapamil was increased to 240 mg/day.  Follow-up echo was obtained which showed an EF at 50 to 55% with grade 1 diastolic dysfunction without significant valvular heart disease.  There was a small to moderate pericardial effusion which was unchanged from an ultrasound in 2015.  Subsequently she has been evaluated by AFabian Sharpin September 2020 and last saw Dr. TLovena Lein February 2021.  Since I last saw her, she stopped using CPAP therapy.  She states  that she had lost approximately 80 pounds over an 40-monthperiod during [place]  She is not exercising and admits to less endurance.  She has had a difficult year with kidney stones as well as COVID infection in June 2022.  She also has chronic neck pain from an injury previously.  He denies any exertional chest pain.  She presents for evaluation.  Past Medical History:  Diagnosis Date   Automatic implantable cardiac defibrillator in situ    ST. JRavena  CHF (congestive heart  failure) (HCC)    Diabetes mellitus    Hyperlipidemia    Hypertension 06/25/2011   Renal dopplers - superior mesenteric artery >50% diameter reduction; R renal artery - normal patency; L proximal renal artery 1-59% reduction (lower end of scale); both kidneys normal in size/symmetry with normal cortex and medulla   Leg pain 12/09/2010   doppler of R femoral artery - no evidence of dissection, AV fistula, pseudoaneurysm or other vascular abnormalities   Migraines    Obesity    PONV (postoperative nausea and vomiting)    Sleep apnea    on CPAP - AHI during sleep study was 44   Type II or unspecified type diabetes mellitus without mention of complication, not stated as uncontrolled    Type II or unspecified type diabetes mellitus without mention of complication, not stated as uncontrolled    Unspecified sleep apnea     Past Surgical History:  Procedure Laterality Date   ABDOMINAL HYSTERECTOMY     total -    BIV PACEMAKER GENERATOR CHANGE OUT N/A 01/16/2014   Procedure: BIV PACEMAKER GENERATOR CHANGE OUT;  Surgeon: GEvans Lance MD;  [facility];  Service: Cardiovascular;  Laterality: N/A;   CARDIAC CATHETERIZATION  07/15/2006   no significant CAD by cardiac cath, confirmed by intravascular [place] LAD; non-ischemic cardiomyopathy prob related to uncontrolled hypertension, DM and morbid obesity; moderate pulmonary hypertension; preserved cardiac output and cardiac index   CARDIAC DEFIBRILLATOR PLACEMENT     ICD by Dr. TLovena Le  CARDIOVASCULAR STRESS TEST  08/28/2010   R/P MV - pattern of normal perfusion in all regions, no scintigraphic evidence of inducible myocardial ischemia; no EKG changes; normal perfusion study; pt did experience chesst pain during strudy, resolved spontaneously   DOPPLER ECHOCARDIOGRAPHY  06/25/2011   EF >55%; moderate concentric LV hypertrophy; LA mildly dilated, mild mitral annular calcification;    EXTRACORPOREAL SHOCK WAVE LITHOTRIPSY Left 06/09/2020    Procedure: EXTRACORPOREAL SHOCK WAVE LITHOTRIPSY (ESWL);  Surgeon: GJanith Lima MD;  [facility]  Service: Urology;  Laterality: Left;  90 MINS   KNEE SURGERY Right    arthroscopic   NECK SURGERY     PACEMAKER INSERTION        Current Outpatient Medications  Medication Sig Dispense Refill   aspirin 81 MG tablet Take 162 mg by mouth at bedtime.      aspirin-acetaminophen-caffeine (EXCEDRIN MIGRAINE) 250-250-65 MG per tablet Take 2 tablets by mouth daily as needed for migraine.     carvedilol (COREG) 12.5 MG tablet TAKE 1 TABLET(12.5 MG) BY MOUTH TWICE DAILY 180 tablet 1   diazepam (VALIUM) 5 MG tablet Take 1/2 to 1 tablet at Bedtime ONLY if needed 30 tablet 0   estradiol (ESTRACE) 1 MG tablet Take 1.5 tablets by mouth daily.  4   fenofibrate micronized (LOFIBRA) 134 MG capsule Take 1 capsule (134 mg total) by mouth daily before breakfast. 30 capsule 5  imipramine (TOFRANIL) 25 MG tablet TAKE 1 TO 2 TABLETS BY MOUTH AT BEDTIME AS NEEDED FOR SLEEP 180 tablet 0   loratadine (CLARITIN) 10 MG tablet Take 10 mg by mouth daily.     losartan (COZAAR) 25 MG tablet Take 1 tablet (25 mg total) by mouth daily. 30 tablet 5   meloxicam (MOBIC) 15 MG tablet Take one daily with food for 2 weeks, can take with tylenol, can not take with aleve, iburpofen, then as needed daily for pain 30 tablet 0   metFORMIN (GLUCOPHAGE XR) 500 MG 24 hr tablet Take 4 tabs daily as tolerated, spread over meals. 360 tablet 3   Potassium 99 MG TABS Take 0.5 tablets (49.5 mg total) by mouth daily. 330 tablet    promethazine-dextromethorphan (PROMETHAZINE-DM) 6.25-15 MG/5ML syrup Take 1 tsp every 4 hours if needed for cough 240 mL 1   rizatriptan (MAXALT) 10 MG tablet TAKE 1 TABLET BY MOUTH FOR SEVERE MIGRAINE AND MAY REPEAT IN 2 HOURS IF NEEDED(MAX 2 TABLETS PER 24 HOURS) 10 tablet 0   rosuvastatin (CRESTOR) 40 MG tablet Take 1 tablet (40 mg total) by mouth daily. TAKE 1 TABLET(40 MG) BY MOUTH DAILY.  30 tablet 5   Semaglutide, 1 MG/DOSE, 4 MG/3ML SOPN Inject 1 mg into the skin once a week. 9 mL 1   spironolactone (ALDACTONE) 25 MG tablet Take 1 tablet (25 mg total) by mouth 2 (two) times daily. 180 tablet 3   tamsulosin (FLOMAX) 0.4 MG CAPS capsule Take  1 capsule  Daily  for Bladder 90 capsule 1   valACYclovir (VALTREX) 500 MG tablet TAKE 1 TABLET(500 MG) BY MOUTH DAILY 90 tablet 0   verapamil (CALAN-SR) 240 MG CR tablet TAKE 1 TABLET(240 MG) BY MOUTH AT BEDTIME 30 tablet 5   VITAMIN D PO Take 2,000 Units by mouth.     No current facility-administered medications for this visit.    Socially she is married and has 2 children and 2 grandchildren. There is no tobacco or alcohol use.  ROS General: Negative; No fevers, chills, or night sweats;  HEENT: Negative; No changes in vision or hearing, sinus congestion, difficulty swallowing Pulmonary: Negative; No cough, wheezing, shortness of breath, hemoptysis Cardiovascular: See history of present illness GI: Negative; No nausea, vomiting, diarrhea, or abdominal pain GU: Negative; No dysuria, hematuria, or difficulty voiding Musculoskeletal: Negative; no myalgias, joint pain, or weakness Hematologic/Oncology: Negative; no easy bruising, bleeding Immunologic: History of anaphylactic reaction secondary to Flagyl Endocrine: Negative; no heat/cold intolerance; no diabetes Neuro: Negative; no changes in balance, headaches Skin: Negative; No rashes or skin lesions Psychiatric: Negative; No behavioral problems, depression Sleep: Positive for obstructive sleep apnea previously on CPAP therapy.  She self discontinued therapy following weight loss. No daytime sleepiness, hypersomnolence, bruxism, restless legs, hypnogognic hallucinations, no cataplexy  A new Epworth Sleepiness Scale score was calculated in the office today February 18, 2021 which endorsed at 4 arguing against residual daytime sleepiness.  Other comprehensive 14 point system review is  negative.  PE BP 138/76    Pulse 81    Ht '5\' 7"'  (1.702 m)    Wt 191 lb (86.6 kg)    SpO2 99%    BMI 29.91 kg/m    Repeat blood pressure by me 120/7  Wt Readings from Last 3 Encounters:  02/18/21 191 lb (86.6 kg)  01/28/21 187 lb 9.6 oz (85.1 kg)  09/04/20 183 lb (83 kg)   General: Alert, oriented, no distress.  Skin: normal turgor, no rashes,  warm and dry HEENT: Normocephalic, atraumatic. Pupils equal round and reactive to light; sclera anicteric; extraocular muscles intact;  Nose without nasal septal hypertrophy Mouth/Parynx benign; Mallinpatti scale 3 Neck: No JVD, no carotid bruits; normal carotid upstroke Lungs: clear to ausculatation and percussion; no wheezing or rales Chest wall: without tenderness to palpitation Heart: PMI not displaced, RRR, s1 s2 normal, 1/6 systolic murmur, no diastolic murmur, no rubs, gallops, thrills, or heaves Abdomen: soft, nontender; no hepatosplenomehaly, BS+; abdominal aorta nontender and not dilated by palpation. Back: no CVA tenderness Pulses 2+ Musculoskeletal: full range of motion, normal strength, no joint deformities Extremities: no clubbing cyanosis or edema, Homan's sign negative  Neurologic: grossly nonfocal; Cranial nerves grossly wnl Psychologic: Normal mood and affect  February 18, 2021 ECG (independently read by me): Atrial sensed, Ventrular paced at 81  September 05, 2017 ECG (independently read by me): Atrially sensed, ventricular paced rhythm at 78 bpm.  No ectopy  I have personally reviewed the ECG from 03/23/2016.  She had biventricular pacing.  Ventricular rate was 72 bpm.  ECG (independently read by me): Atrially sensed rhythm and 100% ventricular pacing    January 2015 ECG (interpreted by me) : Atrial lead sensed, ventricular paced rhythm with 100% ventricular capture    LABS: BMP Latest Ref Rng & Units 01/28/2021 09/04/2020 05/27/2020  Glucose 65 - 99 mg/dL 144(H) 393(H) 293(H)  BUN 7 - 25 mg/dL '12 22 12  ' Creatinine 0.50 -  1.05 mg/dL 0.70 0.86 0.84  BUN/Creat Ratio 6 - 22 (calc) NOT APPLICABLE NOT APPLICABLE NOT APPLICABLE  Sodium 030 - 146 mmol/L 143 136 137  Potassium 3.5 - 5.3 mmol/L 3.9 4.7 4.0  Chloride 98 - 110 mmol/L 105 97(L) 101  CO2 20 - 32 mmol/L 32 32 30  Calcium 8.6 - 10.4 mg/dL 10.2 10.0 9.0   Hepatic Function Latest Ref Rng & Units 01/28/2021 09/04/2020 05/27/2020  Total Protein 6.1 - 8.1 g/dL 6.9 6.9 6.5  Albumin 3.6 - 5.1 g/dL - - -  AST 10 - 35 U/L '19 15 18  ' ALT 6 - 29 U/L '15 17 16  ' Alk Phosphatase 33 - 130 U/L - - -  Total Bilirubin 0.2 - 1.2 mg/dL 0.4 0.5 0.5  Bilirubin, Direct 0.0 - 0.2 mg/dL - - -   CBC Latest Ref Rng & Units 01/28/2021 09/04/2020 05/27/2020  WBC 3.8 - 10.8 Thousand/uL 6.9 10.2 6.7  Hemoglobin 11.7 - 15.5 g/dL 13.6 14.4 12.9  Hematocrit 35.0 - 45.0 % 40.9 42.9 38.9  Platelets 140 - 400 Thousand/uL 299 284 259   Lab Results  Component Value Date   TSH 1.09 01/28/2021   Lipid Panel     Component Value Date/Time   CHOL 125 01/28/2021 1505   TRIG 128 01/28/2021 1505   HDL 37 (L) 01/28/2021 1505   CHOLHDL 3.4 01/28/2021 1505   VLDL 27 11/19/2015 0954   LDLCALC 67 01/28/2021 1505     RADIOLOGY: No results found.  IMPRESSION:  1. Nonischemic cardiomyopathy ([place])   2. OSA (obstructive sleep apnea): Previous CPAP use   3. Pacemaker: Initial Saint Jude BiV permanent pacemaker with subsequent generator change/downgrade to CRT-P in 2015 for an ICM, LBBB   4. Hyperlipidemia associated with type 2 diabetes mellitus ([place])   5. History of kidney stones     ASSESSMENT AND PLAN: Ms. Brindley is a 65 year old female with a previous history of a nonischemic cardiomyopathy with subsequent normalization of LV function following initial biventricular ICD implantation and medical therapy.  In November 2015, she underwent a generator change and downgrade to a by the permanent pacemaker rather than ICD.  She had issues with not taking her diuretic and dietary indiscretion  leading to some class II heart failure symptoms.  I have not seen her in over 3 years.  She is followed by Dr. Lovena Le for her pacemaker.  She required extracorporal choreal shockwave lithotripsy for kidney stones she also had COVID infection in June 2022.  She has lost approximately 80 pounds over 18 months.  Her blood pressure today is stable and on repeat by me was 120/70.  Continues to be on carvedilol 12.5 mg twice a day, losartan 25 mg daily, spironolactone 25 mg daily and verapamil 240 mg at bedtime for hypertension and her prior palpitations.  She has history of mixed hyperlipidemia and is on rosuvastatin in addition to fenofibrate.  Laboratory January 28, 2021 showed LDL cholesterol at 67 with triglycerides 128 and total cholesterol 125.  She is diabetic on metformin and semaglutide.  She is no longer using CPAP therapy with her weight loss and her Epworth Sleepiness Scale score calculated today was excellent at 4.  She has noticed less endurance but she admits that she has not been regularly exercising.  She will continue current therapy.  In 6 months I have recommended she undergo a follow-up echo Doppler evaluation and office visit.   Troy Sine, MD, [facility]  02/25/2021 1:54 PM

## 2021-02-25 ENCOUNTER — Encounter: Payer: Self-pay | Admitting: Cardiovascular Disease

## 2021-02-27 ENCOUNTER — Encounter: Payer: Self-pay | Admitting: Adult Health

## 2021-03-03 ENCOUNTER — Other Ambulatory Visit: Payer: Self-pay | Admitting: Adult Health

## 2021-03-03 DIAGNOSIS — B351 Tinea unguium: Secondary | ICD-10-CM

## 2021-03-03 MED ORDER — TERBINAFINE HCL 250 MG PO TABS: 250.0000 mg | ORAL_TABLET | Freq: Every day | ORAL | 0 refills | Status: DC

## 2021-03-19 ENCOUNTER — Other Ambulatory Visit: Payer: Self-pay

## 2021-03-19 ENCOUNTER — Ambulatory Visit (INDEPENDENT_AMBULATORY_CARE_PROVIDER_SITE_OTHER): Payer: BC Managed Care – PPO | Admitting: Adult Health

## 2021-03-19 ENCOUNTER — Encounter: Payer: Self-pay | Admitting: Adult Health

## 2021-03-19 ENCOUNTER — Ambulatory Visit: Payer: BC Managed Care – PPO | Admitting: Adult Health

## 2021-03-19 VITALS — BP 136/88 | HR 83 | Temp 97.5°F | Wt 188.0 lb

## 2021-03-19 DIAGNOSIS — Z1152 Encounter for screening for COVID-19: Secondary | ICD-10-CM

## 2021-03-19 DIAGNOSIS — R6889 Other general symptoms and signs: Secondary | ICD-10-CM | POA: Diagnosis not present

## 2021-03-19 DIAGNOSIS — J069 Acute upper respiratory infection, unspecified: Secondary | ICD-10-CM | POA: Diagnosis not present

## 2021-03-19 LAB — POCT INFLUENZA A/B
Influenza A, POC: NEGATIVE
Influenza B, POC: NEGATIVE

## 2021-03-19 LAB — POC COVID19 BINAXNOW: SARS Coronavirus 2 Ag: NEGATIVE

## 2021-03-19 NOTE — Progress Notes (Signed)
Assessment and Plan:  Madline was seen today for acute visit.  Diagnoses and all orders for this visit:  Viral URI with cough Appears consistent with viral URI Discussed typical course of 5-7 days with day 3-4 being the worst Discussed the importance of avoiding unnecessary antibiotic therapy. Suggested symptomatic OTC remedies, URI handout given Zinc lozenges q2h may reduce duration Nasal saline spray for congestion. Nasal steroids, benadryl at night Avoid sudafed due to heart hx She declines oral steroids or cough medication Follow up as needed if improving then sudden worsening, persistent or progressive sx past 7-10 days, localizing sx  Encounter for screening for COVID-19 -     POC COVID-19  Flu-like symptoms -     POCT Influenza A/B  Discussed med's effects and SE's.   Over 30 minutes of exam, counseling, chart review, and critical decision making was performed.   Future Appointments  Date Time Provider Department Center  03/31/2021  8:40 AM [facility] DEVICE REMOTES [facility]  04/14/2021  9:00 AM Judd Gaudier, NP [facility] None  06/17/2021  4:00 PM Judd Gaudier, NP [facility] None  06/30/2021  8:40 AM [facility] DEVICE REMOTES [facility]  08/10/2021  9:40 AM [facility] DIAG TOMO 1 [facility]  08/10/2021  9:50 AM [facility] [place] 1 [facility]  08/18/2021  1:50 PM [facility] ECHO 2 [facility]  02/02/2022  3:00 PM Judd Gaudier, NP [facility] None    ------------------------------------------------------------------------------------------------------------------   HPI BP 136/88    Pulse 83    Temp (!) 97.5 F (36.4 C)    Wt 188 lb (85.3 kg)    SpO2 99%    BMI 29.44 kg/m  66 y.o.female presents for URI x 2 days. Tested negative for covid and flu today.   She reports 2 days of nasal congestion, rhinitis, sneezing, then yesterday started having sore throat, tender lymph nodes, cough, post-nasal drip with dry cough  from throat. She denies fever/chills. She reports has some mental fog but no headache. Denies rash, GI sx. Denies chest congestion, dyspnea, wheezing.   She has taken some zzzquil but otherwise hasn't tried anything.   Past Medical History:  Diagnosis Date   Automatic implantable cardiac defibrillator in situ    [employer] MODEL 7122   CHF (congestive heart failure) (HCC)    Diabetes mellitus    Hyperlipidemia    Hypertension 06/25/2011   Renal dopplers - superior mesenteric artery >50% diameter reduction; R renal artery - normal patency; L proximal renal artery 1-59% reduction (lower end of scale); both kidneys normal in size/symmetry with normal cortex and medulla   Leg pain 12/09/2010   doppler of R femoral artery - no evidence of dissection, AV fistula, pseudoaneurysm or other vascular abnormalities   Migraines    Obesity    PONV (postoperative nausea and vomiting)    Sleep apnea    on CPAP - AHI during sleep study was 44   Type II or unspecified type diabetes mellitus without mention of complication, not stated as uncontrolled    Type II or unspecified type diabetes mellitus without mention of complication, not stated as uncontrolled    Unspecified sleep apnea      Allergies  Allergen Reactions   Flagyl [Metronidazole] Anaphylaxis   Niacin Other (See Comments)   Niacin And Related Other (See Comments) and Cough    flushing   Penicillins Swelling and Rash    Has patient had a PCN reaction causing immediate rash, facial/tongue/throat swelling, SOB or lightheadedness with hypotension:  No Has patient had a PCN reaction causing severe rash involving mucus membranes or skin necrosis: No Has patient had a PCN reaction that required hospitalization No Has patient had a PCN reaction occurring within the last 10 years: Yes 3-4 years ago If all of the above answers are "NO", then may proceed with Cephalosporin use.   Yeast-Related Products Swelling and Rash    Current Outpatient  Medications on File Prior to Visit  Medication Sig   aspirin 81 MG tablet Take 162 mg by mouth at bedtime.    aspirin-acetaminophen-caffeine (EXCEDRIN MIGRAINE) 250-250-65 MG per tablet Take 2 tablets by mouth daily as needed for migraine.   carvedilol (COREG) 12.5 MG tablet TAKE 1 TABLET(12.5 MG) BY MOUTH TWICE DAILY   diazepam (VALIUM) 5 MG tablet Take 1/2 to 1 tablet at Bedtime ONLY if needed   estradiol (ESTRACE) 1 MG tablet Take 1.5 tablets by mouth daily.   fenofibrate micronized (LOFIBRA) 134 MG capsule Take 1 capsule (134 mg total) by mouth daily before breakfast.   imipramine (TOFRANIL) 25 MG tablet TAKE 1 TO 2 TABLETS BY MOUTH AT BEDTIME AS NEEDED FOR SLEEP   loratadine (CLARITIN) 10 MG tablet Take 10 mg by mouth daily.   meloxicam (MOBIC) 15 MG tablet Take one daily with food for 2 weeks, can take with tylenol, can not take with aleve, iburpofen, then as needed daily for pain   metFORMIN (GLUCOPHAGE XR) 500 MG 24 hr tablet Take 4 tabs daily as tolerated, spread over meals.   Potassium 99 MG TABS Take 0.5 tablets (49.5 mg total) by mouth daily.   promethazine-dextromethorphan (PROMETHAZINE-DM) 6.25-15 MG/5ML syrup Take 1 tsp every 4 hours if needed for cough   rizatriptan (MAXALT) 10 MG tablet TAKE 1 TABLET BY MOUTH FOR SEVERE MIGRAINE AND MAY REPEAT IN 2 HOURS IF NEEDED(MAX 2 TABLETS PER 24 HOURS)   rosuvastatin (CRESTOR) 40 MG tablet Take 1 tablet (40 mg total) by mouth daily. TAKE 1 TABLET(40 MG) BY MOUTH DAILY.   Semaglutide, 1 MG/DOSE, 4 MG/3ML SOPN Inject 1 mg into the skin once a week.   spironolactone (ALDACTONE) 25 MG tablet Take 1 tablet (25 mg total) by mouth 2 (two) times daily.   tamsulosin (FLOMAX) 0.4 MG CAPS capsule Take  1 capsule  Daily  for Bladder   terbinafine (LAMISIL) 250 MG tablet Take 1 tablet (250 mg total) by mouth daily. Take one daily for 3 months, need liver function lab at 6 weeks.   valACYclovir (VALTREX) 500 MG tablet TAKE 1 TABLET(500 MG) BY MOUTH DAILY    verapamil (CALAN-SR) 240 MG CR tablet TAKE 1 TABLET(240 MG) BY MOUTH AT BEDTIME   VITAMIN D PO Take 2,000 Units by mouth.   losartan (COZAAR) 25 MG tablet Take 1 tablet (25 mg total) by mouth daily.   No current facility-administered medications on file prior to visit.   Allergies:  Allergies  Allergen Reactions   Flagyl [Metronidazole] Anaphylaxis   Niacin Other (See Comments)   Niacin And Related Other (See Comments) and Cough    flushing   Penicillins Swelling and Rash    Has patient had a PCN reaction causing immediate rash, facial/tongue/throat swelling, SOB or lightheadedness with hypotension: No Has patient had a PCN reaction causing severe rash involving mucus membranes or skin necrosis: No Has patient had a PCN reaction that required hospitalization No Has patient had a PCN reaction occurring within the last 10 years: Yes 3-4 years ago If all of the above answers are "  NO", then may proceed with Cephalosporin use.   Yeast-Related Products Swelling and Rash   Social History:   reports that she quit smoking about 46 years ago. Her smoking use included cigarettes. She has never used smokeless tobacco. She reports that she does not currently use alcohol. She reports that she does not use drugs.  ROS: all negative except above.   Physical Exam:  BP 136/88    Pulse 83    Temp (!) 97.5 F (36.4 C)    Wt 188 lb (85.3 kg)    SpO2 99%    BMI 29.44 kg/m   General Appearance: Well nourished, in no apparent distress. Eyes: PERRLA, conjunctiva no swelling or erythema Sinuses: No Frontal/maxillary tenderness ENT/Mouth: Ext aud canals clear, TMs without erythema, bulging. No erythema, swelling, or exudate on post pharynx.  Tonsils not swollen or erythematous. Clear rhinitis. Hearing normal.  Neck: Supple Respiratory: Respiratory effort normal, BS equal bilaterally without rales, rhonchi, wheezing or stridor.  Cardio: RRR with no MRGs. Brisk peripheral pulses without edema.  Abdomen:  Soft, + BS.  Non tender Lymphatics: Tender R> L without palpable lymphadenopathy.  Musculoskeletal: normal gait.  Skin: Warm, dry without rashes, lesions, ecchymosis.  Neuro: Normal muscle tone Psych: Awake and oriented X 3, normal affect, Insight and Judgment appropriate.     Dan Maker, NP 11:46 AM [facility]

## 2021-03-19 NOTE — Patient Instructions (Signed)
Try the cold eez zinc logenges every 2 hours while you are -    HOW TO TREAT VIRAL COUGH AND COLD SYMPTOMS: -Symptoms usually last at least 1 week with the worst symptoms being around day 4. - colds usually start with a sore throat and end with a cough, and the cough can take 2 weeks to get better. -No antibiotics are needed for colds, flu, sore throats, cough, bronchitis UNLESS symptoms are longer than 7 days OR if you are getting better then get drastically worse. -There are a lot of combination medications (Dayquil, Nyquil, Vicks 44, tyelnol cold and sinus, ETC). Please look at the ingredients on the back so that you are treating the correct symptoms and not doubling up on medications/ingredients.  Medicines you can use  Nasal congestion [employer] Remedies saline spray (aerosol/mist)- can try this, it is in the kids section - pseudoephedrine (Sudafed)- behind the counter, do not use if you have high blood pressure, medicine that have -D in them. - phenylephrine (Sudafed PE) -Dextormethorphan + chlorpheniramine (Coridcidin HBP)- okay if you have high blood pressure -Oxymetazoline (Afrin) nasal spray- LIMIT to 3 days -Saline nasal spray -Neti pot (used distilled or bottled water)  Ear pain/congestion -pseudoephedrine (sudafed) - Nasonex/flonase nasal spray  Fever -Acetaminophen (Tyelnol) -Ibuprofen (Advil, motrin, aleve)  Sore Throat -Acetaminophen (Tyelnol) -Ibuprofen (Advil, motrin, aleve) -Drink a lot of water -Gargle with salt water - Rest your voice (don't talk) -Throat sprays -Cough drops  Body Aches -Acetaminophen (Tyelnol) -Ibuprofen (Advil, motrin, aleve)  Headache -Acetaminophen (Tyelnol) -Ibuprofen (Advil, motrin, aleve) - Exedrin, Exedrin Migraine  Allergy symptoms (cough, sneeze, runny nose, itchy eyes) -Claritin or loratadine cheapest but likely the weakest -Zyrtec or certizine at night because it can make you sleepy -The strongest is allegra or  fexafinadine Cheapest at [employer], [employer], [employer]  Cough -Dextromethorphan (Delsym)- medicine that has DM in it -Guafenesin (Mucinex/Robitussin) - cough drops - drink lots of water  Chest Congestion -Guafenesin (Mucinex/Robitussin)  Red Itchy Eyes - Naphcon-A  Upset Stomach - Bland diet (nothing spicy, greasy, fried, and high acid foods like tomatoes, oranges, berries) -OKAY- cereal, bread, soup, crackers, rice -Eat smaller more frequent meals -reduce caffeine, no alcohol -Loperamide (Imodium-AD) if diarrhea -Prevacid for heart burn  General health when sick -Hydration -wash your hands frequently -keep surfaces clean -change pillow cases and sheets often -Get fresh air but do not exercise strenuously -Vitamin D, double up on it - Vitamin C -Zinc

## 2021-03-30 ENCOUNTER — Other Ambulatory Visit: Payer: Self-pay

## 2021-03-30 ENCOUNTER — Encounter: Payer: Self-pay | Admitting: Adult Health

## 2021-03-30 ENCOUNTER — Encounter: Payer: Self-pay | Admitting: Internal Medicine

## 2021-03-30 LAB — CUP PACEART REMOTE DEVICE CHECK
Battery Remaining Longevity: 17 mo
Battery Remaining Percentage: 19 %
Battery Voltage: 2.89 V
Brady Statistic AP VP Percent: 2.9 %
Brady Statistic AP VS Percent: 1 %
Brady Statistic AS VP Percent: 97 %
Brady Statistic AS VS Percent: 1 %
Brady Statistic RA Percent Paced: 2.8 %
Date Time Interrogation Session: 20230122125139
Implantable Lead Implant Date: 20091016
Implantable Lead Implant Date: 20091016
Implantable Lead Implant Date: 20091016
Implantable Lead Location: 753858
Implantable Lead Location: 753859
Implantable Lead Location: 753860
Implantable Lead Model: 4196
Implantable Lead Model: 7122
Implantable Pulse Generator Implant Date: 20151111
Lead Channel Impedance Value: 380 Ohm
Lead Channel Impedance Value: 480 Ohm
Lead Channel Impedance Value: 730 Ohm
Lead Channel Pacing Threshold Amplitude: 0.25 V
Lead Channel Pacing Threshold Amplitude: 1 V
Lead Channel Pacing Threshold Amplitude: 1.25 V
Lead Channel Pacing Threshold Pulse Width: 0.3 ms
Lead Channel Pacing Threshold Pulse Width: 0.3 ms
Lead Channel Pacing Threshold Pulse Width: 0.6 ms
Lead Channel Sensing Intrinsic Amplitude: 3.4 mV
Lead Channel Sensing Intrinsic Amplitude: 7.8 mV
Lead Channel Setting Pacing Amplitude: 2 V
Lead Channel Setting Pacing Amplitude: 2.5 V
Lead Channel Setting Pacing Amplitude: 2.5 V
Lead Channel Setting Pacing Pulse Width: 0.3 ms
Lead Channel Setting Pacing Pulse Width: 0.6 ms
Lead Channel Setting Sensing Sensitivity: 2 mV
Pulse Gen Model: 3222
Pulse Gen Serial Number: 7688750

## 2021-03-30 MED ORDER — ROSUVASTATIN CALCIUM 40 MG PO TABS: 40.0000 mg | ORAL_TABLET | Freq: Every day | ORAL | 5 refills | Status: DC

## 2021-03-30 MED ORDER — FENOFIBRATE 134 MG PO CAPS: 134.0000 mg | ORAL_CAPSULE | Freq: Every day | ORAL | 5 refills | Status: DC

## 2021-03-30 MED ORDER — LOSARTAN POTASSIUM 25 MG PO TABS: 25.0000 mg | ORAL_TABLET | Freq: Every day | ORAL | 5 refills | Status: DC

## 2021-03-31 ENCOUNTER — Ambulatory Visit (INDEPENDENT_AMBULATORY_CARE_PROVIDER_SITE_OTHER): Payer: BC Managed Care – PPO

## 2021-03-31 DIAGNOSIS — I428 Other cardiomyopathies: Secondary | ICD-10-CM

## 2021-04-10 NOTE — Progress Notes (Signed)
Remote pacemaker transmission.   

## 2021-04-10 NOTE — Progress Notes (Signed)
Assessment and Plan:  [name] was seen today for follow-up.  Diagnoses and all orders for this visit:  Onychomycosis Seeing improvement, tolerating lamisil well; check LFTs, continue therapy 6 weeks then re-evaluate -     Cancel: Hepatic function panel -     COMPLETE METABOLIC PANEL WITH GFR  Dysuria History of kidney stones Passing particulate per patient, with dysuria r/o UTI vs urethritis  Treat as indicated by cultures Continue pushing fluids  -     COMPLETE METABOLIC PANEL WITH GFR -     Urinalysis, Routine w reflex microscopic -     Urine Culture  Acute rhinosinusitis Day 3, fairly benign HPI and exam Most likely viral URI Discussed the importance of avoiding unnecessary antibiotic therapy, most recent guidelines  She was offered covid 19 testing today but declined at this time Suggested symptomatic OTC remedies. Emphasized hydration Nasal saline spray/irrigations, humidifier for congestion. allergy pill, oral steroids offered - declines. Avoid topical steroid due to bleeding Follow up as needed if secondary worsening or persistent longer than 10 days   Further disposition pending results of labs. Discussed med's effects and SE's.   Over 30 minutes of exam, counseling, chart review, and critical decision making was performed.   Future Appointments  Date Time Provider Department Center  06/17/2021  4:00 PM Judd Gaudier, NP [facility] None  06/30/2021  8:40 AM [facility] DEVICE REMOTES [facility]  08/10/2021  9:40 AM [facility] DIAG TOMO 1 [facility]  08/10/2021  9:50 AM [facility] [place] 1 [facility]  08/18/2021  1:50 PM [facility] ECHO 2 [facility]  09/29/2021  8:40 AM [facility] DEVICE REMOTES [facility]  12/29/2021  8:40 AM [facility] DEVICE REMOTES [facility]  02/02/2022  3:00 PM Judd Gaudier, NP [facility] None  03/30/2022  8:40 AM [facility] DEVICE REMOTES [facility]  06/29/2022  8:40  AM [facility] DEVICE REMOTES [facility]  09/28/2022  8:40 AM [facility] DEVICE REMOTES [facility]    ------------------------------------------------------------------------------------------------------------------   HPI BP 138/78    Pulse 84    Temp (!) 97.2 F (36.2 C)    Wt 189 lb (85.7 kg)    SpO2 99%    BMI 29.60 kg/m  66 y.o.female presents for 6 week follow up after initiation of lamisil for onychomycosis. She also c/o dysuria, URI sx.   She denies any SE, n/v/d or abdominal pain.   She reports was traveling last week to [place], returned on Friday, sx started a day or two after returning with sniffles, progressed with more sinus congestion, pressure, also very dry MM, has had some crusting and bloody discharge. Denies HA, sinus tenderness. Has been doing topical zinc lozenges. Had similar about 6 weeks ago, tested negative for covid 19 then, discussed but declines testing today.   She also reports for the last 3-4 weeks has been passing "sand" from her kidneys, hx of stones, was pushing fluids and doing well but was unable to hydrate. No blood in the last 2 weeks, no flank pain.   She reports in the last few days has had dysuria, has had urgency/frequency for the last 3 weeks. She denies fever/chills, flank pain, vaginal discharge. Some suprapubic tenderness.    Lab Results  Component Value Date   ALT 15 01/28/2021   AST 19 01/28/2021   ALKPHOS 57 05/13/2016   BILITOT 0.4 01/28/2021     Past Medical History:  Diagnosis Date   Automatic implantable cardiac defibrillator in situ    [employer] MODEL  7122   CHF (congestive heart failure) (HCC)    Diabetes mellitus    Hyperlipidemia    Hypertension 06/25/2011   Renal dopplers - superior mesenteric artery >50% diameter reduction; R renal artery - normal patency; L proximal renal artery 1-59% reduction (lower end of scale); both kidneys normal in size/symmetry with normal cortex and medulla   Leg  pain 12/09/2010   doppler of R femoral artery - no evidence of dissection, AV fistula, pseudoaneurysm or other vascular abnormalities   Migraines    Obesity    PONV (postoperative nausea and vomiting)    Sleep apnea    on CPAP - AHI during sleep study was 44   Type II or unspecified type diabetes mellitus without mention of complication, not stated as uncontrolled    Type II or unspecified type diabetes mellitus without mention of complication, not stated as uncontrolled    Unspecified sleep apnea      Allergies  Allergen Reactions   Flagyl [Metronidazole] Anaphylaxis   Niacin Other (See Comments)   Niacin And Related Other (See Comments) and Cough    flushing   Penicillins Swelling and Rash    Has patient had a PCN reaction causing immediate rash, facial/tongue/throat swelling, SOB or lightheadedness with hypotension: No Has patient had a PCN reaction causing severe rash involving mucus membranes or skin necrosis: No Has patient had a PCN reaction that required hospitalization No Has patient had a PCN reaction occurring within the last 10 years: Yes 3-4 years ago If all of the above answers are "NO", then may proceed with Cephalosporin use.   Yeast-Related Products Swelling and Rash    Current Outpatient Medications on File Prior to Visit  Medication Sig   aspirin 81 MG tablet Take 162 mg by mouth at bedtime.    aspirin-acetaminophen-caffeine (EXCEDRIN MIGRAINE) 250-250-65 MG per tablet Take 2 tablets by mouth daily as needed for migraine.   carvedilol (COREG) 12.5 MG tablet TAKE 1 TABLET(12.5 MG) BY MOUTH TWICE DAILY   diazepam (VALIUM) 5 MG tablet Take 1/2 to 1 tablet at Bedtime ONLY if needed   estradiol (ESTRACE) 1 MG tablet Take 1.5 tablets by mouth daily.   fenofibrate micronized (LOFIBRA) 134 MG capsule Take 1 capsule (134 mg total) by mouth daily before breakfast.   imipramine (TOFRANIL) 25 MG tablet TAKE 1 TO 2 TABLETS BY MOUTH AT BEDTIME AS NEEDED FOR SLEEP   loratadine  (CLARITIN) 10 MG tablet Take 10 mg by mouth daily.   losartan (COZAAR) 25 MG tablet Take 1 tablet (25 mg total) by mouth daily.   meloxicam (MOBIC) 15 MG tablet Take one daily with food for 2 weeks, can take with tylenol, can not take with aleve, iburpofen, then as needed daily for pain   metFORMIN (GLUCOPHAGE XR) 500 MG 24 hr tablet Take 4 tabs daily as tolerated, spread over meals.   Potassium 99 MG TABS Take 0.5 tablets (49.5 mg total) by mouth daily.   promethazine-dextromethorphan (PROMETHAZINE-DM) 6.25-15 MG/5ML syrup Take 1 tsp every 4 hours if needed for cough   rizatriptan (MAXALT) 10 MG tablet TAKE 1 TABLET BY MOUTH FOR SEVERE MIGRAINE AND MAY REPEAT IN 2 HOURS IF NEEDED(MAX 2 TABLETS PER 24 HOURS)   rosuvastatin (CRESTOR) 40 MG tablet Take 1 tablet (40 mg total) by mouth daily. TAKE 1 TABLET(40 MG) BY MOUTH DAILY.   Semaglutide, 1 MG/DOSE, 4 MG/3ML SOPN Inject 1 mg into the skin once a week.   spironolactone (ALDACTONE) 25 MG tablet Take 1  tablet (25 mg total) by mouth 2 (two) times daily.   tamsulosin (FLOMAX) 0.4 MG CAPS capsule Take  1 capsule  Daily  for Bladder   terbinafine (LAMISIL) 250 MG tablet Take 1 tablet (250 mg total) by mouth daily. Take one daily for 3 months, need liver function lab at 6 weeks.   valACYclovir (VALTREX) 500 MG tablet TAKE 1 TABLET(500 MG) BY MOUTH DAILY   verapamil (CALAN-SR) 240 MG CR tablet TAKE 1 TABLET(240 MG) BY MOUTH AT BEDTIME   VITAMIN D PO Take 2,000 Units by mouth.   No current facility-administered medications on file prior to visit.   Allergies:  Allergies  Allergen Reactions   Flagyl [Metronidazole] Anaphylaxis   Niacin Other (See Comments)   Niacin And Related Other (See Comments) and Cough    flushing   Penicillins Swelling and Rash    Has patient had a PCN reaction causing immediate rash, facial/tongue/throat swelling, SOB or lightheadedness with hypotension: No Has patient had a PCN reaction causing severe rash involving mucus  membranes or skin necrosis: No Has patient had a PCN reaction that required hospitalization No Has patient had a PCN reaction occurring within the last 10 years: Yes 3-4 years ago If all of the above answers are "NO", then may proceed with Cephalosporin use.   Yeast-Related Products Swelling and Rash   Surgical History:  She  has a past surgical history that includes Abdominal hysterectomy; Neck surgery; Pacemaker insertion; Cardiac defibrillator placement; Knee surgery (Right); doppler echocardiography (06/25/2011); Cardiovascular stress test (08/28/2010); Cardiac catheterization (07/15/2006); Biv pacemaker generator change out (N/A, 01/16/2014); and Extracorporeal shock wave lithotripsy (Left, 06/09/2020). Family History:  Herfamily history includes Cancer - Cervical in her mother; Colon cancer in her maternal uncle; Colon cancer (age of onset: 18) in her maternal grandfather; Colon polyps in her mother; Hearing loss in her father; Hypertension in her father; Valvular heart disease in her father. Social History:   reports that she quit smoking about 46 years ago. Her smoking use included cigarettes. She has never used smokeless tobacco. She reports that she does not currently use alcohol. She reports that she does not use drugs.   ROS: all negative except above.   Physical Exam:  BP 138/78    Pulse 84    Temp (!) 97.2 F (36.2 C)    Wt 189 lb (85.7 kg)    SpO2 99%    BMI 29.60 kg/m   General Appearance: Well nourished, in no apparent distress. Eyes: PERRLA, EOMs, conjunctiva no swelling or erythema Sinuses: No Frontal/maxillary tenderness.  ENT/Mouth: Ext aud canals clear, TMs without erythema, bulging. No erythema, swelling, or exudate on post pharynx.  Tonsils not swollen or erythematous. Sniffling. Hearing normal.  Neck: Supple Respiratory: Respiratory effort normal, BS equal bilaterally without rales, rhonchi, wheezing or stridor.  Cardio: RRR with no MRGs. Brisk peripheral pulses without  edema.  Abdomen: Soft, + BS.  Mild suprapubic tenderness, no guarding, rebound, hernias, masses. No CVA tenderness Lymphatics: Non tender without lymphadenopathy.  Musculoskeletal: normal gait.  Skin: Warm, dry without rashes, lesions, ecchymosis. R 1st digit toenair with medial half missing, remaining area with some thichening/yellowed at border, no localized skin changes.  Neuro: Normal muscle tone, Sensation intact.  Psych: Awake and oriented X 3, normal affect, Insight and Judgment appropriate.     Dan Maker, NP 9:32 AM [facility]

## 2021-04-14 ENCOUNTER — Encounter: Payer: Self-pay | Admitting: Adult Health

## 2021-04-14 ENCOUNTER — Ambulatory Visit (INDEPENDENT_AMBULATORY_CARE_PROVIDER_SITE_OTHER): Payer: BC Managed Care – PPO | Admitting: Adult Health

## 2021-04-14 ENCOUNTER — Other Ambulatory Visit: Payer: Self-pay

## 2021-04-14 VITALS — BP 138/78 | HR 84 | Temp 97.2°F | Wt 189.0 lb

## 2021-04-14 DIAGNOSIS — R300 Dysuria: Secondary | ICD-10-CM | POA: Diagnosis not present

## 2021-04-14 DIAGNOSIS — J019 Acute sinusitis, unspecified: Secondary | ICD-10-CM

## 2021-04-14 DIAGNOSIS — Z87442 Personal history of urinary calculi: Secondary | ICD-10-CM

## 2021-04-14 DIAGNOSIS — B351 Tinea unguium: Secondary | ICD-10-CM | POA: Diagnosis not present

## 2021-04-16 ENCOUNTER — Other Ambulatory Visit: Payer: Self-pay | Admitting: Nurse Practitioner

## 2021-04-16 ENCOUNTER — Encounter: Payer: Self-pay | Admitting: Adult Health

## 2021-04-16 ENCOUNTER — Other Ambulatory Visit: Payer: Self-pay | Admitting: Adult Health

## 2021-04-16 DIAGNOSIS — R319 Hematuria, unspecified: Secondary | ICD-10-CM

## 2021-04-16 DIAGNOSIS — N390 Urinary tract infection, site not specified: Secondary | ICD-10-CM

## 2021-04-16 DIAGNOSIS — F411 Generalized anxiety disorder: Secondary | ICD-10-CM

## 2021-04-16 LAB — COMPLETE METABOLIC PANEL WITH GFR
AG Ratio: 1.6 (calc) (ref 1.0–2.5)
ALT: 17 U/L (ref 6–29)
AST: 20 U/L (ref 10–35)
Albumin: 4.2 g/dL (ref 3.6–5.1)
Alkaline phosphatase (APISO): 44 U/L (ref 37–153)
BUN: 12 mg/dL (ref 7–25)
CO2: 30 mmol/L (ref 20–32)
Calcium: 9.5 mg/dL (ref 8.6–10.4)
Chloride: 102 mmol/L (ref 98–110)
Creat: 0.81 mg/dL (ref 0.50–1.05)
Globulin: 2.7 g/dL (calc) (ref 1.9–3.7)
Glucose, Bld: 149 mg/dL — ABNORMAL HIGH (ref 65–99)
Potassium: 4.1 mmol/L (ref 3.5–5.3)
Sodium: 140 mmol/L (ref 135–146)
Total Bilirubin: 0.5 mg/dL (ref 0.2–1.2)
Total Protein: 6.9 g/dL (ref 6.1–8.1)
eGFR: 81 mL/min/{1.73_m2} (ref >=60)

## 2021-04-16 LAB — URINE CULTURE
MICRO NUMBER:: 12975776
SPECIMEN QUALITY:: ADEQUATE

## 2021-04-16 LAB — URINALYSIS, ROUTINE W REFLEX MICROSCOPIC
Bilirubin Urine: NEGATIVE
Glucose, UA: NEGATIVE
Hyaline Cast: NONE SEEN /LPF
Ketones, ur: NEGATIVE
Nitrite: NEGATIVE
Protein, ur: NEGATIVE
RBC / HPF: NONE SEEN /HPF (ref 0–2)
Specific Gravity, Urine: 1.007 (ref 1.001–1.035)
WBC, UA: >=60 /HPF — AB (ref 0–5)
pH: 7.5 (ref 5.0–8.0)

## 2021-04-16 LAB — MICROSCOPIC MESSAGE

## 2021-04-16 MED ORDER — SULFAMETHOXAZOLE-TRIMETHOPRIM 800-160 MG PO TABS: 1.0000 | ORAL_TABLET | Freq: Two times a day (BID) | ORAL | 0 refills | Status: DC

## 2021-05-07 LAB — HM DIABETES EYE EXAM

## 2021-05-11 ENCOUNTER — Encounter: Payer: Self-pay | Admitting: Internal Medicine

## 2021-06-04 ENCOUNTER — Encounter: Payer: Self-pay | Admitting: Adult Health

## 2021-06-08 ENCOUNTER — Ambulatory Visit (INDEPENDENT_AMBULATORY_CARE_PROVIDER_SITE_OTHER): Payer: BC Managed Care – PPO | Admitting: Nurse Practitioner

## 2021-06-08 ENCOUNTER — Encounter: Payer: Self-pay | Admitting: Nurse Practitioner

## 2021-06-08 VITALS — BP 140/82 | HR 82 | Temp 97.7°F | Wt 190.6 lb

## 2021-06-08 DIAGNOSIS — R6889 Other general symptoms and signs: Secondary | ICD-10-CM

## 2021-06-08 DIAGNOSIS — R051 Acute cough: Secondary | ICD-10-CM | POA: Diagnosis not present

## 2021-06-08 DIAGNOSIS — R52 Pain, unspecified: Secondary | ICD-10-CM | POA: Insufficient documentation

## 2021-06-08 DIAGNOSIS — J029 Acute pharyngitis, unspecified: Secondary | ICD-10-CM | POA: Diagnosis not present

## 2021-06-08 DIAGNOSIS — J0100 Acute maxillary sinusitis, unspecified: Secondary | ICD-10-CM | POA: Diagnosis not present

## 2021-06-08 DIAGNOSIS — R5383 Other fatigue: Secondary | ICD-10-CM

## 2021-06-08 LAB — POCT RAPID STREP A (OFFICE): Rapid Strep A Screen: NEGATIVE

## 2021-06-08 LAB — POCT INFLUENZA A/B
Influenza A, POC: NEGATIVE
Influenza B, POC: NEGATIVE

## 2021-06-08 MED ORDER — AZITHROMYCIN 250 MG PO TABS: ORAL_TABLET | ORAL | 1 refills | Status: DC

## 2021-06-08 NOTE — Progress Notes (Addendum)
Assessment and Plan: ? ?Avionna was seen today for a episodic visit. ? ?Diagnoses and all orders for this visit: ? ?1. Acute non-recurrent  maxillary sinusitis/acute pharyngitis, unspecified etiology/acute cough/other fatiuge/bodyaches ? ?Continue to stay well hydrated to keep mucus thin and productive. ?Continue DayQuil/NyQuil ?Start Azithromycin ? ?- POCT rapid strep A - Negative ?- Azithromycin ? ?2. Flu-like symptoms ? ?- POCT Influenza A/B - Negative  ? ?RTC if s/s fail to improve. ? ?Further disposition pending results of labs. Discussed med's effects and SE's.   ?Over 30 minutes of exam, counseling, chart review, and critical decision making was performed.  ? ?Future Appointments  ?Date Time Provider Department Center  ?06/17/2021  4:00 PM Judd Gaudier, NP [facility] None  ?06/30/2021  8:40 AM [facility] DEVICE REMOTES [facility]  ?08/10/2021  9:40 AM [facility] DIAG TOMO 1 [facility]  ?08/10/2021  9:50 AM GI-[facility] [place] 1 [facility]  ?08/18/2021  1:50 PM [facility] CH ECHO 2 [facility]  ?09/29/2021  8:40 AM [facility] DEVICE REMOTES [facility]  ?12/29/2021  8:40 AM [facility] DEVICE REMOTES [facility]  ?02/02/2022  3:00 PM Judd Gaudier, NP [facility] None  ?03/30/2022  8:40 AM [facility] DEVICE REMOTES [facility]  ?06/29/2022  8:40 AM [facility] DEVICE REMOTES [facility]  ?09/28/2022  8:40 AM [facility] DEVICE REMOTES [facility]  ? ? ?------------------------------------------------------------------------------------------------------------------ ? ? ?HPI ?BP 140/82   Pulse 82   Temp 97.7 ?F (36.5 ?C)   Wt 190 lb 9.6 oz (86.5 kg)   SpO2 97%   BMI 29.85 kg/m?  ? ?66 y.o.female presents with c/o sore throat, chick productive cough, stuffy ears, head and chest congestion, body aches, fatigue for 5 days.  She tested for Covid 3 days ago and was negative.  She has been taking NyQuil and DayQuil PRN  with some effectiveness.  No hx off COPD or asthma.  Denies N/V, fever, chills, SOB.   ? ?Past Medical History:  ?Diagnosis Date  ? Automatic implantable cardiac defibrillator in situ   ? [employer] 7122  ? CHF (congestive heart failure) (HCC)   ? Diabetes mellitus   ? Hyperlipidemia   ? Hypertension 06/25/2011  ? Renal dopplers - superior mesenteric artery >50% diameter reduction; R renal artery - normal patency; L proximal renal artery 1-59% reduction (lower end of scale); both kidneys normal in size/symmetry with normal cortex and medulla  ? Leg pain 12/09/2010  ? doppler of R femoral artery - no evidence of dissection, AV fistula, pseudoaneurysm or other vascular abnormalities  ? Migraines   ? Obesity   ? PONV (postoperative nausea and vomiting)   ? Sleep apnea   ? on CPAP - AHI during sleep study was 44  ? Type II or unspecified type diabetes mellitus without mention of complication, not stated as uncontrolled   ? Type II or unspecified type diabetes mellitus without mention of complication, not stated as uncontrolled   ? Unspecified sleep apnea   ?  ? ?Allergies  ?Allergen Reactions  ? Flagyl [Metronidazole] Anaphylaxis  ? Niacin Other (See Comments)  ? Niacin And Related Other (See Comments) and Cough  ?  flushing  ? Penicillins Swelling and Rash  ?  Has patient had a PCN reaction causing immediate rash, facial/tongue/throat swelling, SOB or lightheadedness with hypotension: No ?Has patient had a PCN reaction causing severe rash involving mucus membranes or skin necrosis: No ?Has patient had a PCN reaction that required hospitalization No ?Has patient had a PCN reaction occurring  within the last 10 years: Yes 3-4 years ago ?If all of the above answers are "NO", then may proceed with Cephalosporin use.  ? Yeast-Related Products Swelling and Rash  ? ? ?Current Outpatient Medications on File Prior to Visit  ?Medication Sig  ? aspirin 81 MG tablet Take 162 mg by mouth at bedtime.   ?  aspirin-acetaminophen-caffeine (EXCEDRIN MIGRAINE) 250-250-65 MG per tablet Take 2 tablets by mouth daily as needed for migraine.  ? carvedilol (COREG) 12.5 MG tablet TAKE 1 TABLET(12.5 MG) BY MOUTH TWICE DAILY  ? diazepam (VALIUM) 5 MG tablet Take 1/2 to 1 tablet at Bedtime ONLY if needed  ? estradiol (ESTRACE) 1 MG tablet Take 1.5 tablets by mouth daily.  ? fenofibrate micronized (LOFIBRA) 134 MG capsule Take 1 capsule (134 mg total) by mouth daily before breakfast.  ? imipramine (TOFRANIL) 25 MG tablet TAKE 1 TO 2 TABLETS BY MOUTH AT BEDTIME AS NEEDED FOR SLEEP  ? loratadine (CLARITIN) 10 MG tablet Take 10 mg by mouth daily.  ? losartan (COZAAR) 25 MG tablet Take 1 tablet (25 mg total) by mouth daily.  ? meloxicam (MOBIC) 15 MG tablet Take one daily with food for 2 weeks, can take with tylenol, can not take with aleve, iburpofen, then as needed daily for pain  ? metFORMIN (GLUCOPHAGE XR) 500 MG 24 hr tablet Take 4 tabs daily as tolerated, spread over meals.  ? Potassium 99 MG TABS Take 0.5 tablets (49.5 mg total) by mouth daily.  ? Pseudoeph-Doxylamine-DM-APAP (DAYQUIL/NYQUIL COLD/FLU RELIEF PO) Take by mouth.  ? rizatriptan (MAXALT) 10 MG tablet TAKE 1 TABLET BY MOUTH FOR SEVERE MIGRAINE AND MAY REPEAT IN 2 HOURS IF NEEDED(MAX 2 TABLETS PER 24 HOURS)  ? rosuvastatin (CRESTOR) 40 MG tablet Take 1 tablet (40 mg total) by mouth daily. TAKE 1 TABLET(40 MG) BY MOUTH DAILY.  ? Semaglutide, 1 MG/DOSE, 4 MG/3ML SOPN Inject 1 mg into the skin once a week.  ? spironolactone (ALDACTONE) 25 MG tablet Take 1 tablet (25 mg total) by mouth 2 (two) times daily.  ? sulfamethoxazole-trimethoprim (BACTRIM DS) 800-160 MG tablet Take 1 tablet by mouth 2 (two) times daily.  ? tamsulosin (FLOMAX) 0.4 MG CAPS capsule Take  1 capsule  Daily  for Bladder  ? valACYclovir (VALTREX) 500 MG tablet TAKE 1 TABLET(500 MG) BY MOUTH DAILY  ? verapamil (CALAN-SR) 240 MG CR tablet TAKE 1 TABLET(240 MG) BY MOUTH AT BEDTIME  ? VITAMIN D PO Take 2,000  Units by mouth.  ? promethazine-dextromethorphan (PROMETHAZINE-DM) 6.25-15 MG/5ML syrup Take 1 tsp every 4 hours if needed for cough (Patient not taking: Reported on 06/08/2021)  ? ?No current facility-administered medications on file prior to visit.  ? ? ?ROS: all negative except what is noted in the HPI.  ? ?Physical Exam: ? ?BP 140/82   Pulse 82   Temp 97.7 ?F (36.5 ?C)   Wt 190 lb 9.6 oz (86.5 kg)   SpO2 97%   BMI 29.85 kg/m?  ? ?General Appearance: NAD.  Awake, conversant and cooperative. ?Eyes: PERRLA, EOMs intact.  Sclera white.  Conjunctiva without erythema. ?Sinuses: Tender maxillary and frontal sinuses.  No nasal discharge. Nares without patency.  ?ENT/Mouth:  Posterior pharynx with mild erythema.  Ext aud canals clear.  Bilateral TMs w/DOL and without erythema or bulging. Hearing intact.   ?Neck: Right eustachian tube tenderness to palpation. ?Respiratory: Right posterior and anterior expiratory and inspiratory breaths sounds diminished in all lung fields.  Effort is regular with non-labored breathing.  ?  Cardio: RRR with no MRGs. Brisk peripheral pulses without edema.  ?Abdomen: Active BS in all four quadrants.  Soft and non-tender without guarding, rebound tenderness, hernias or masses. ?Lymphatics: Non tender without lymphadenopathy.  ?Musculoskeletal: Full ROM, 5/5 strength, normal ambulation.  No clubbing or cyanosis. ?Skin: Appropriate color for ethnicity. Warm without rashes, lesions, ecchymosis, ulcers.  ?Neuro: CN II-XII grossly normal. Normal muscle tone without cerebellar symptoms and intact sensation.   ?Psych: AO X 3,  appropriate mood and affect, insight and judgment.  ?  ? ?Adela Glimpse, NP ?12:15 PM ?[facility] ? ? ?

## 2021-06-09 ENCOUNTER — Other Ambulatory Visit: Payer: Self-pay | Admitting: Adult Health

## 2021-06-09 DIAGNOSIS — E1122 Type 2 diabetes mellitus with diabetic chronic kidney disease: Secondary | ICD-10-CM

## 2021-06-09 MED ORDER — MOUNJARO 10 MG/0.5ML ~~LOC~~ SOAJ: 10.0000 mg | SUBCUTANEOUS | 0 refills | Status: DC

## 2021-06-10 ENCOUNTER — Encounter: Payer: Self-pay | Admitting: Nurse Practitioner

## 2021-06-17 ENCOUNTER — Other Ambulatory Visit (HOSPITAL_COMMUNITY): Payer: Self-pay

## 2021-06-17 ENCOUNTER — Encounter: Payer: Self-pay | Admitting: Adult Health

## 2021-06-17 ENCOUNTER — Ambulatory Visit (INDEPENDENT_AMBULATORY_CARE_PROVIDER_SITE_OTHER): Payer: BC Managed Care – PPO | Admitting: Adult Health

## 2021-06-17 VITALS — BP 144/88 | HR 86 | Temp 97.5°F | Wt 193.0 lb

## 2021-06-17 DIAGNOSIS — E785 Hyperlipidemia, unspecified: Secondary | ICD-10-CM

## 2021-06-17 DIAGNOSIS — I5022 Chronic systolic (congestive) heart failure: Secondary | ICD-10-CM | POA: Diagnosis not present

## 2021-06-17 DIAGNOSIS — E669 Obesity, unspecified: Secondary | ICD-10-CM

## 2021-06-17 DIAGNOSIS — E1122 Type 2 diabetes mellitus with diabetic chronic kidney disease: Secondary | ICD-10-CM

## 2021-06-17 DIAGNOSIS — Z79899 Other long term (current) drug therapy: Secondary | ICD-10-CM | POA: Diagnosis not present

## 2021-06-17 DIAGNOSIS — E1169 Type 2 diabetes mellitus with other specified complication: Secondary | ICD-10-CM

## 2021-06-17 DIAGNOSIS — I10 Essential (primary) hypertension: Secondary | ICD-10-CM | POA: Diagnosis not present

## 2021-06-17 DIAGNOSIS — E1143 Type 2 diabetes mellitus with diabetic autonomic (poly)neuropathy: Secondary | ICD-10-CM | POA: Diagnosis not present

## 2021-06-17 DIAGNOSIS — I700 Atherosclerosis of aorta: Secondary | ICD-10-CM | POA: Diagnosis not present

## 2021-06-17 DIAGNOSIS — N182 Chronic kidney disease, stage 2 (mild): Secondary | ICD-10-CM

## 2021-06-17 MED ORDER — MOUNJARO 5 MG/0.5ML ~~LOC~~ SOAJ
5.0000 mg | SUBCUTANEOUS | 0 refills | Status: DC
  Filled 2021-06-17 – 2021-07-01 (×5): qty 2, 28d supply, fill #0

## 2021-06-17 MED ORDER — PHENTERMINE HCL 37.5 MG PO TABS: ORAL_TABLET | ORAL | 1 refills | Status: DC

## 2021-06-17 NOTE — Progress Notes (Signed)
3 MONTH FOLLOW UP  ? ?Assessment and Plan: ? ?Essential hypertension ?- continue medications, DASH diet, exercise and monitor at home. Call if persistently greater than 130/80.  ?-     CBC with Differential/Platelet ?-     CMP/GFR ?-     TSH ? ?Nonischemic cardiomyopathy ([place]) ?Continue weight loss, monitor weight at home, follow up cardio ?Patient euvolemic ? ?Chronic systolic congestive heart failure ([place]) ?Continue weight loss, monitor weight at home, follow up cardio ?Patient euvolemic ? ?OSA  ?She is no longer on CPAP following weight loss,  ? ?CKD 2 associated with T2DM ([place]) ?Increase fluids, avoid NSAIDS, monitor sugars, will monitor ?- CMP/GFR ? ?Diabetic autonomic neuropathy associated with type 2 diabetes mellitus ([place]) ?-     Hemoglobin A1c ?-- continue weight loss ?- discussed low sugars and awareness, will follow her in office  ? ?T2DM with CKD II ([place]) ?- She was off of meds and stopped following with Dr. Chalmers Cater following weight loss; stress and weight gain, now on metformin pending mounjaro ?- working on lifestyle to get back off meds if possible  ?- discussed low sugars and awareness,  ?-     Hemoglobin A1c ? ?Hyperlipidemia associated with T2DM ([place]) ?-     Lipid panel ?-continue medications, LDL goal <70, check lipids, decrease fatty foods, increase activity.  ? ?Obesity - BMI 30 ?Excellent progress with 80 lb weight loss this past year ?- long discussion about weight loss, diet, and exercise ?- stress management ?- low processed diet ?- starting mounjaro if can get insurance coverage; high deductible barrier ?- strong patient preference to restart phentermine short term, has tolerated in the past despite her cardiac hx; reiterated increased cardiac risks with this medication, she wants to proceed ?- phentermine filled, encouraged lowest necessary dose with close follow up in 4 weeks for recheck  ? ?Diastolic dysfunction ?Appears euvolemic;  ?Low sodium diet, monitor fluid intake ?Continue weight  loss ? ?Generalized anxiety disorder ?Continue meds, rare valium  ? ?Medication management ?-     Magnesium ? ?Vitamin D deficiency ?Continue supplement for goal 60-100 ? ? ?Orders Placed This Encounter  ?Procedures  ? CBC with Differential/Platelet  ? COMPLETE METABOLIC PANEL WITH GFR  ? Magnesium  ? Lipid panel  ? TSH  ? Hemoglobin A1c  ? ? ?Discussed med's effects and SE's. Screening labs and tests as requested with regular follow-up as recommended. ?Future Appointments  ?Date Time Provider [place]  ?06/30/2021  8:40 AM [facility] DEVICE REMOTES [facility]  ?08/13/2021 11:00 AM [facility] DIAG TOMO 1 [facility]  ?08/13/2021 11:10 AM GI-[facility] [place] 1 [facility]  ?08/18/2021  1:50 PM [facility]-CV CH ECHO 2 [facility]ITE[facility]ECHO [facility]  ?09/29/2021  8:40 AM [facility] DEVICE REMOTES [facility]  ?12/29/2021  8:40 AM [facility] DEVICE REMOTES [facility]  ?02/02/2022  3:00 PM Liane Comber, NP [facility] None  ?03/30/2022  8:40 AM [facility] DEVICE REMOTES [facility]  ?06/29/2022  8:40 AM [facility] DEVICE REMOTES [facility]  ?09/28/2022  8:40 AM [facility] DEVICE REMOTES [facility]  ? ? ?HPI ?66 y.o. female  presents for 3 month follow up for htn, hld, T2DM, weight, vitamin D def.  ? ?She has OSA and is on CPAP, lost 80 lb and stopped wearing with resolution of sx, declined referral for retest.  ? ?She has hx of TAH, follows [facility] with [facility], on HRT for hot flashes.  ? ?She has hx of anxiety, prescribed valium, takes 2.5 mg occasionally,  1-2 days a year, typically around the holidays.  ? ?BMI is Body mass index is 30.23 kg/m?., she has been working on diet and exercise. She is trying to get down to 160 lb on home scale, lost 80 lb from peak weight of 240 lb in 08/2018 but has been trending back up this past year. She reports has fallen off of the wagon, has been craving more with stress, working to get back to  previous good habits.  ?She if feeling very frustrated due to sugar cravings; has done very well in the past with phentermine (tolerated despite cardiac hx), would like to restart short term.  ?Wt Readings from Last 3 Encounters:  ?06/17/21 193 lb (87.5 kg)  ?06/08/21 190 lb 9.6 oz (86.5 kg)  ?04/14/21 189 lb (85.7 kg)  ? ?Her blood pressure has been controlled at home, today their BP is BP: (!) 144/88  ?She does workout, does fitness walk program inside.   She denies chest pain, shortness of breath, dizziness.  ? 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 Lovena Le and Dr. Claiborne Billings.  ?Last echo was 02/2018 with EF 50-55%. She is on BB, ARB, spirolactone, and torsemide. Has follow up ECHO planned in [facility] June.  ? ?She has been working on diet and exercise for Diabetes, she was off insulin and metformin following 80 lb weight loss.  ?Has regained about 20 lb in the last year, back on metformin  ?Had added ozempic but difficulty getting filled, lack of benefit, has been prescribed mounjaro but pending PA/pharmacy barriers ?With CKD she is on ACE/ARB on cozaar 25mg .  ?With hyperlipidemia is on crestor 40 and fenofibrate  ?With neuropathy in the past, but improved  ?With CAD she is on bASA ?Recently fasting reported around 120s-130s ?She see's Dr. Bing Plume for eye exams last 05/07/2021 ?denies hypoglycemia , polydipsia, polyuria and visual disturbances.  ?Last A1C was:  ?Lab Results  ?Component Value Date  ? HGBA1C 8.5 (H) 01/28/2021  ? ?Lab Results  ?Component Value Date  ? GFRNONAA 71 09/04/2020  ? ?Lab Results  ?Component Value Date  ? CHOL 125 01/28/2021  ? HDL 37 (L) 01/28/2021  ? [place] 67 01/28/2021  ? TRIG 128 01/28/2021  ? CHOLHDL 3.4 01/28/2021  ? ?Patient is on Vitamin D supplement, unsure of dose, ? 4000 IU ?Lab Results  ?Component Value Date  ? VD25OH 41 01/28/2021  ? ? ? ?Current Medications:  ?Current Outpatient Medications on File Prior to  Visit  ?Medication Sig Dispense Refill  ? aspirin 81 MG tablet Take 162 mg by mouth at bedtime.     ? aspirin-acetaminophen-caffeine (EXCEDRIN MIGRAINE) 250-250-65 MG per tablet Take 2 tablets by mouth daily as needed for migraine.    ? carvedilol (COREG) 12.5 MG tablet TAKE 1 TABLET(12.5 MG) BY MOUTH TWICE DAILY 180 tablet 1  ? diazepam (VALIUM) 5 MG tablet Take 1/2 to 1 tablet at Bedtime ONLY if needed 30 tablet 0  ? estradiol (ESTRACE) 1 MG tablet Take 1.5 tablets by mouth daily.  4  ? fenofibrate micronized (LOFIBRA) 134 MG capsule Take 1 capsule (134 mg total) by mouth daily before breakfast. 30 capsule 5  ? imipramine (TOFRANIL) 25 MG tablet TAKE 1 TO 2 TABLETS BY MOUTH AT BEDTIME AS NEEDED FOR SLEEP 180 tablet 0  ? loratadine (CLARITIN) 10 MG tablet Take 10 mg by mouth daily.    ? losartan (COZAAR) 25 MG tablet  Take 1 tablet (25 mg total) by mouth daily. 30 tablet 5  ? meloxicam (MOBIC) 15 MG tablet Take one daily with food for 2 weeks, can take with tylenol, can not take with aleve, iburpofen, then as needed daily for pain 30 tablet 0  ? metFORMIN (GLUCOPHAGE XR) 500 MG 24 hr tablet Take 4 tabs daily as tolerated, spread over meals. 360 tablet 3  ? Potassium 99 MG TABS Take 0.5 tablets (49.5 mg total) by mouth daily. 330 tablet   ? Pseudoeph-Doxylamine-DM-APAP (DAYQUIL/NYQUIL COLD/FLU RELIEF PO) Take by mouth.    ? rizatriptan (MAXALT) 10 MG tablet TAKE 1 TABLET BY MOUTH FOR SEVERE MIGRAINE AND MAY REPEAT IN 2 HOURS IF NEEDED(MAX 2 TABLETS PER 24 HOURS) 10 tablet 0  ? rosuvastatin (CRESTOR) 40 MG tablet Take 1 tablet (40 mg total) by mouth daily. TAKE 1 TABLET(40 MG) BY MOUTH DAILY. 30 tablet 5  ? spironolactone (ALDACTONE) 25 MG tablet Take 1 tablet (25 mg total) by mouth 2 (two) times daily. 180 tablet 3  ? tamsulosin (FLOMAX) 0.4 MG CAPS capsule Take  1 capsule  Daily  for Bladder 90 capsule 1  ? valACYclovir (VALTREX) 500 MG tablet TAKE 1 TABLET(500 MG) BY MOUTH DAILY 90 tablet 0  ? verapamil (CALAN-SR)  240 MG CR tablet TAKE 1 TABLET(240 MG) BY MOUTH AT BEDTIME 30 tablet 5  ? VITAMIN D PO Take 2,000 Units by mouth.    ? ?No current facility-administered medications on file prior to visit.  ? ?Medical Hi

## 2021-06-17 NOTE — Patient Instructions (Signed)
? ? ?Tirzepatide Injection ?What is this medication? ?TIRZEPATIDE (tir ZEP a tide) treats type 2 diabetes. It works by increasing insulin levels in your body, which decreases your blood sugar (glucose). Changes to diet and exercise are often combined with this medication. ?This medicine may be used for other purposes; ask your health care provider or pharmacist if you have questions. ?COMMON BRAND NAME(S): MOUNJARO ?What should I tell my care team before I take this medication? ?They need to know if you have any of these conditions: ?Endocrine tumors (MEN 2) or if someone in your family had these tumors ?Eye disease, vision problems ?Gallbladder disease ?History of pancreatitis ?Kidney disease ?Stomach or intestine problems ?Thyroid cancer or if someone in your family had thyroid cancer ?An unusual or allergic reaction to tirzepatide, other medications, foods, dyes, or preservatives ?Pregnant or trying to get pregnant ?Breast-feeding ?How should I use this medication? ?This medication is injected under the skin. You will be taught how to prepare and give it. It is given once every week (every 7 days). Keep taking it unless your health care provider tells you to stop. ?If you use this medication with insulin, you should inject this medication and the insulin separately. Do not mix them together. Do not give the injections right next to each other. Change (rotate) injection sites with each injection. ?This medication comes with INSTRUCTIONS FOR USE. Ask your pharmacist for directions on how to use this medication. Read the information carefully. Talk to your pharmacist or care team if you have questions. ?It is important that you put your used needles and syringes in a special sharps container. Do not put them in a trash can. If you do not have a sharps container, call your pharmacist or care team to get one. ?A special MedGuide will be given to you by the pharmacist with each prescription and refill. Be sure to read  this information carefully each time. ?Talk to your care team about the use of this medication in children. Special care may be needed. ?Overdosage: If you think you have taken too much of this medicine contact a poison control center or emergency room at once. ?NOTE: This medicine is only for you. Do not share this medicine with others. ?What if I miss a dose? ?If you miss a dose, take it as soon as you can unless it is more than 4 days (96 hours) late. If it is more than 4 days late, skip the missed dose. Take the next dose at the normal time. Do not take 2 doses within 3 days of each other. ?What may interact with this medication? ?Alcohol containing beverages ?Antiviral medications for HIV or AIDS ?Aspirin and aspirin-like medications ?Beta-blockers like atenolol, metoprolol, propranolol ?Certain medications for blood pressure, heart disease, irregular heart beat ?Chromium ?Clonidine ?Diuretics ?Female hormones, such as estrogens or progestins, birth control pills ?Fenofibrate ?Gemfibrozil ?Guanethidine ?Isoniazid ?Lanreotide ?Female hormones or anabolic steroids ?MAOIs like Carbex, Eldepryl, Marplan, Nardil, and Parnate ?Medications for weight loss ?Medications for allergies, asthma, cold, or cough ?Medications for depression, anxiety, or psychotic disturbances ?Niacin ?Nicotine ?NSAIDs, medications for pain and inflammation, like ibuprofen or naproxen ?Octreotide ?Other medications for diabetes, like glyburide, glipizide, or glimepiride ?Pasireotide ?Pentamidine ?Phenytoin ?Probenecid ?Quinolone antibiotics such as ciprofloxacin, levofloxacin, ofloxacin ?Reserpine ?Some herbal dietary supplements ?Steroid medications such as prednisone or cortisone ?Sulfamethoxazole; trimethoprim ?Thyroid hormones ?Warfarin ?This list may not describe all possible interactions. Give your health care provider a list of all the medicines, herbs, non-prescription drugs, or dietary  supplements you use. Also tell them if you smoke,  drink alcohol, or use illegal drugs. Some items may interact with your medicine. ?What should I watch for while using this medication? ?Visit your care team for regular checks on your progress. ?Drink plenty of fluids while taking this medication. Check with your care team if you get an attack of severe diarrhea, nausea, and vomiting. The loss of too much body fluid can make it dangerous for you to take this medication. ?A test called the HbA1C (A1C) will be monitored. This is a simple blood test. It measures your blood sugar control over the last 2 to 3 months. You will receive this test every 3 to 6 months. ?Learn how to check your blood sugar. Learn the symptoms of low and high blood sugar and how to manage them. ?Always carry a quick-source of sugar with you in case you have symptoms of low blood sugar. Examples include hard sugar candy or glucose tablets. Make sure others know that you can choke if you eat or drink when you develop serious symptoms of low blood sugar, such as seizures or unconsciousness. They must get medical help at once. ?Tell your care team if you have high blood sugar. You might need to change the dose of your medication. If you are sick or exercising more than usual, you might need to change the dose of your medication. ?Do not skip meals. Ask your care team if you should avoid alcohol. Many nonprescription cough and cold products contain sugar or alcohol. These can affect blood sugar. ?Pens should never be shared. Even if the needle is changed, sharing may result in passing of viruses like hepatitis or HIV. ?Wear a medical ID bracelet or chain, and carry a card that describes your disease and details of your medication and dosage times. ?Birth control may not work properly while you are taking this medication. If you take birth control pills by mouth, your care team may recommend another type of birth control for 4 weeks after you start this medication and for 4 weeks after each increase  in your dose of this medication. Ask your care team which birth control methods you should use. ?What side effects may I notice from receiving this medication? ?Side effects that you should report to your care team as soon as possible: ?Allergic reactions--skin rash, itching, hives, swelling of the face, lips, tongue, or throat ?Change in vision ?Dehydration--increased thirst, dry mouth, feeling faint or lightheaded, headache, dark yellow or brown urine ?Gallbladder problems--severe stomach pain, nausea, vomiting, fever ?Kidney injury--decrease in the amount of urine, swelling of the ankles, hands, or feet ?Pancreatitis--severe stomach pain that spreads to your back or gets worse after eating or when touched, fever, nausea, vomiting ?Thyroid cancer--new mass or lump in the neck, pain or trouble swallowing, trouble breathing, hoarseness ?Side effects that usually do not require medical attention (report these to your care team if they continue or are bothersome): ?Constipation ?Diarrhea ?Loss of Appetite ?Nausea ?Stomach pain ?Upset stomach ?Vomiting ?This list may not describe all possible side effects. Call your doctor for medical advice about side effects. You may report side effects to FDA at 1-800-FDA-1088. ?Where should I keep my medication? ?Keep out of the reach of children and pets. ?Refrigeration (preferred): Store unopened pens in a refrigerator between 2 and 8 degrees C (36 and 46 degrees F). Keep it in the original carton until you are ready to take it. Do not freeze or use if the medication has  been frozen. Protect from light. Get rid of any unused medication after the expiration date on the label. ?Room Temperature: The pen may be stored at room temperature below 30 degrees C (86 degrees F) for up to a total of 21 days if needed. Protect from light. Avoid exposure to extreme heat. If it is stored at room temperature, throw away any unused medication after 21 days or after it expires, whichever is  first. ?The pen has glass parts. Handle it carefully. If you drop the pen on a hard surface, do not use it. Use a new pen for your injection. ?To get rid of medications that are no longer needed or have ex

## 2021-06-18 ENCOUNTER — Other Ambulatory Visit (HOSPITAL_COMMUNITY): Payer: Self-pay

## 2021-06-18 ENCOUNTER — Telehealth: Payer: Self-pay

## 2021-06-18 LAB — COMPLETE METABOLIC PANEL WITH GFR
AG Ratio: 1.3 (calc) (ref 1.0–2.5)
ALT: 16 U/L (ref 6–29)
AST: 20 U/L (ref 10–35)
Albumin: 4.3 g/dL (ref 3.6–5.1)
Alkaline phosphatase (APISO): 49 U/L (ref 37–153)
BUN: 18 mg/dL (ref 7–25)
CO2: 30 mmol/L (ref 20–32)
Calcium: 10 mg/dL (ref 8.6–10.4)
Chloride: 104 mmol/L (ref 98–110)
Creat: 0.92 mg/dL (ref 0.50–1.05)
Globulin: 3.2 g/dL (calc) (ref 1.9–3.7)
Glucose, Bld: 143 mg/dL — ABNORMAL HIGH (ref 65–99)
Potassium: 4 mmol/L (ref 3.5–5.3)
Sodium: 142 mmol/L (ref 135–146)
Total Bilirubin: 0.4 mg/dL (ref 0.2–1.2)
Total Protein: 7.5 g/dL (ref 6.1–8.1)
eGFR: 69 mL/min/{1.73_m2} (ref >=60)

## 2021-06-18 LAB — CBC WITH DIFFERENTIAL/PLATELET
Absolute Monocytes: 558 cells/uL (ref 200–950)
Basophils Absolute: 74 cells/uL (ref 0–200)
Basophils Relative: 0.9 %
Eosinophils Absolute: 197 cells/uL (ref 15–500)
Eosinophils Relative: 2.4 %
HCT: 39.6 % (ref 35.0–45.0)
Hemoglobin: 13.2 g/dL (ref 11.7–15.5)
Lymphs Abs: 3001 cells/uL (ref 850–3900)
MCH: 29.3 pg (ref 27.0–33.0)
MCHC: 33.3 g/dL (ref 32.0–36.0)
MCV: 87.8 fL (ref 80.0–100.0)
MPV: 9.8 fL (ref 7.5–12.5)
Monocytes Relative: 6.8 %
Neutro Abs: 4371 cells/uL (ref 1500–7800)
Neutrophils Relative %: 53.3 %
Platelets: 299 10*3/uL (ref 140–400)
RBC: 4.51 10*6/uL (ref 3.80–5.10)
RDW: 12.7 % (ref 11.0–15.0)
Total Lymphocyte: 36.6 %
WBC: 8.2 10*3/uL (ref 3.8–10.8)

## 2021-06-18 LAB — LIPID PANEL
Cholesterol: 140 mg/dL (ref <200)
HDL: 40 mg/dL — ABNORMAL LOW (ref >=50)
LDL Cholesterol (Calc): 69 mg/dL (calc)
Non-HDL Cholesterol (Calc): 100 mg/dL (calc) (ref <130)
Total CHOL/HDL Ratio: 3.5 (calc) (ref <5.0)
Triglycerides: 213 mg/dL — ABNORMAL HIGH (ref <150)

## 2021-06-18 LAB — HEMOGLOBIN A1C
Hgb A1c MFr Bld: 7.3 % of total Hgb — ABNORMAL HIGH (ref <5.7)
Mean Plasma Glucose: 163 mg/dL
eAG (mmol/L): 9 mmol/L

## 2021-06-18 LAB — TSH: TSH: 1.52 mIU/L (ref 0.40–4.50)

## 2021-06-18 LAB — MAGNESIUM: Magnesium: 1.5 mg/dL (ref 1.5–2.5)

## 2021-06-18 NOTE — Telephone Encounter (Signed)
Prior Authorization for [facility] completed and submitted.  ?

## 2021-06-19 ENCOUNTER — Other Ambulatory Visit: Payer: Self-pay

## 2021-06-19 DIAGNOSIS — I10 Essential (primary) hypertension: Secondary | ICD-10-CM

## 2021-06-19 MED ORDER — VERAPAMIL HCL ER 240 MG PO TBCR: EXTENDED_RELEASE_TABLET | ORAL | 4 refills | Status: DC

## 2021-06-25 ENCOUNTER — Other Ambulatory Visit (HOSPITAL_COMMUNITY): Payer: Self-pay

## 2021-06-30 ENCOUNTER — Other Ambulatory Visit (HOSPITAL_COMMUNITY): Payer: Self-pay

## 2021-06-30 ENCOUNTER — Ambulatory Visit (INDEPENDENT_AMBULATORY_CARE_PROVIDER_SITE_OTHER): Payer: BC Managed Care – PPO

## 2021-06-30 ENCOUNTER — Encounter: Payer: Self-pay | Admitting: Internal Medicine

## 2021-06-30 DIAGNOSIS — I428 Other cardiomyopathies: Secondary | ICD-10-CM | POA: Diagnosis not present

## 2021-06-30 DIAGNOSIS — I5022 Chronic systolic (congestive) heart failure: Secondary | ICD-10-CM

## 2021-06-30 LAB — CUP PACEART REMOTE DEVICE CHECK
Battery Remaining Longevity: 14 mo
Battery Remaining Percentage: 16 %
Battery Voltage: 2.87 V
Brady Statistic AP VP Percent: 2.2 %
Brady Statistic AP VS Percent: 1 %
Brady Statistic AS VP Percent: 98 %
Brady Statistic AS VS Percent: 1 %
Brady Statistic RA Percent Paced: 2.2 %
Date Time Interrogation Session: 20230423023932
Implantable Lead Implant Date: 20091016
Implantable Lead Implant Date: 20091016
Implantable Lead Implant Date: 20091016
Implantable Lead Location: 753858
Implantable Lead Location: 753859
Implantable Lead Location: 753860
Implantable Lead Model: 4196
Implantable Lead Model: 7122
Implantable Pulse Generator Implant Date: 20151111
Lead Channel Impedance Value: 390 Ohm
Lead Channel Impedance Value: 530 Ohm
Lead Channel Impedance Value: 700 Ohm
Lead Channel Pacing Threshold Amplitude: 0.25 V
Lead Channel Pacing Threshold Amplitude: 1 V
Lead Channel Pacing Threshold Amplitude: 1.25 V
Lead Channel Pacing Threshold Pulse Width: 0.3 ms
Lead Channel Pacing Threshold Pulse Width: 0.3 ms
Lead Channel Pacing Threshold Pulse Width: 0.6 ms
Lead Channel Sensing Intrinsic Amplitude: 3.8 mV
Lead Channel Sensing Intrinsic Amplitude: 9.6 mV
Lead Channel Setting Pacing Amplitude: 2 V
Lead Channel Setting Pacing Amplitude: 2.5 V
Lead Channel Setting Pacing Amplitude: 2.5 V
Lead Channel Setting Pacing Pulse Width: 0.3 ms
Lead Channel Setting Pacing Pulse Width: 0.6 ms
Lead Channel Setting Sensing Sensitivity: 2 mV
Pulse Gen Model: 3222
Pulse Gen Serial Number: 7688750

## 2021-07-01 ENCOUNTER — Other Ambulatory Visit (HOSPITAL_COMMUNITY): Payer: Self-pay

## 2021-07-07 ENCOUNTER — Other Ambulatory Visit: Payer: Self-pay | Admitting: Adult Health

## 2021-07-07 DIAGNOSIS — B351 Tinea unguium: Secondary | ICD-10-CM

## 2021-07-16 NOTE — Progress Notes (Signed)
Remote pacemaker transmission.   

## 2021-07-23 ENCOUNTER — Other Ambulatory Visit: Payer: Self-pay | Admitting: Cardiovascular Disease

## 2021-07-27 ENCOUNTER — Other Ambulatory Visit: Payer: Self-pay | Admitting: Adult Health

## 2021-07-27 DIAGNOSIS — F411 Generalized anxiety disorder: Secondary | ICD-10-CM

## 2021-07-28 ENCOUNTER — Ambulatory Visit: Payer: BC Managed Care – PPO | Admitting: Adult Health

## 2021-08-10 ENCOUNTER — Other Ambulatory Visit: Payer: BC Managed Care – PPO

## 2021-08-13 ENCOUNTER — Ambulatory Visit
Admission: RE | Admit: 2021-08-13 | Discharge: 2021-08-13 | Disposition: A | Discharge status: Home / Self Care | Payer: BC Managed Care – PPO | Source: Ambulatory Visit | Attending: Obstetrics | Admitting: Obstetrics

## 2021-08-13 ENCOUNTER — Other Ambulatory Visit: Payer: Self-pay | Admitting: Obstetrics

## 2021-08-13 DIAGNOSIS — N6489 Other specified disorders of breast: Secondary | ICD-10-CM

## 2021-08-18 ENCOUNTER — Other Ambulatory Visit (HOSPITAL_COMMUNITY): Payer: BC Managed Care – PPO

## 2021-09-04 ENCOUNTER — Ambulatory Visit (HOSPITAL_COMMUNITY): Payer: BC Managed Care – PPO | Attending: Cardiology

## 2021-09-04 DIAGNOSIS — I428 Other cardiomyopathies: Secondary | ICD-10-CM | POA: Diagnosis not present

## 2021-09-04 LAB — ECHOCARDIOGRAM COMPLETE: S' Lateral: 3.3 cm

## 2021-09-06 ENCOUNTER — Other Ambulatory Visit: Payer: Self-pay | Admitting: Cardiovascular Disease

## 2021-09-09 ENCOUNTER — Ambulatory Visit: Payer: BC Managed Care – PPO | Admitting: Adult Health

## 2021-09-09 ENCOUNTER — Encounter: Payer: Self-pay | Admitting: Cardiovascular Disease

## 2021-09-10 ENCOUNTER — Telehealth: Payer: Self-pay

## 2021-09-10 ENCOUNTER — Other Ambulatory Visit (HOSPITAL_COMMUNITY): Payer: BC Managed Care – PPO

## 2021-09-10 NOTE — Telephone Encounter (Signed)
Left my chart message.

## 2021-09-13 NOTE — Progress Notes (Unsigned)
Cardiology Clinic Note   Patient Name: [name] Date of Encounter: 09/13/2021  Primary Care Provider:  Unk Pinto, MD Primary Cardiologist:  Shelva Majestic, MD  Patient Profile    [name] 66 year old female presents to the clinic today for follow-up evaluation of her nonischemic cardiomyopathy.  Past Medical History    Past Medical History:  Diagnosis Date   Automatic implantable cardiac defibrillator in situ    [facility] [place]   CHF (congestive heart failure) (HCC)    Diabetes mellitus    Hyperlipidemia    Hypertension 06/25/2011   Renal dopplers - superior mesenteric artery >50% diameter reduction; R renal artery - normal patency; L proximal renal artery 1-59% reduction (lower end of scale); both kidneys normal in size/symmetry with normal cortex and medulla   Leg pain 12/09/2010   doppler of R femoral artery - no evidence of dissection, AV fistula, pseudoaneurysm or other vascular abnormalities   Migraines    Obesity    PONV (postoperative nausea and vomiting)    Sleep apnea    on CPAP - AHI during sleep study was 44   Type II or unspecified type diabetes mellitus without mention of complication, not stated as uncontrolled    Type II or unspecified type diabetes mellitus without mention of complication, not stated as uncontrolled    Unspecified sleep apnea    Past Surgical History:  Procedure Laterality Date   ABDOMINAL HYSTERECTOMY     total -    BIV PACEMAKER GENERATOR CHANGE OUT N/A 01/16/2014   Procedure: BIV PACEMAKER GENERATOR CHANGE OUT;  Surgeon: Evans Lance, MD;  Location: [facility] CATH LAB;  Service: Cardiovascular;  Laterality: N/A;   CARDIAC CATHETERIZATION  07/15/2006   no significant CAD by cardiac cath, confirmed by intravascular [place] of LAD; non-ischemic cardiomyopathy prob related to uncontrolled hypertension, DM and morbid obesity; moderate pulmonary hypertension; preserved cardiac output and cardiac index   CARDIAC DEFIBRILLATOR  PLACEMENT     ICD by Dr. Lovena Le   CARDIOVASCULAR STRESS TEST  08/28/2010   R/P MV - pattern of normal perfusion in all regions, no scintigraphic evidence of inducible myocardial ischemia; no EKG changes; normal perfusion study; pt did experience chesst pain during strudy, resolved spontaneously   DOPPLER ECHOCARDIOGRAPHY  06/25/2011   EF >55%; moderate concentric LV hypertrophy; LA mildly dilated, mild mitral annular calcification;    EXTRACORPOREAL SHOCK WAVE LITHOTRIPSY Left 06/09/2020   Procedure: EXTRACORPOREAL SHOCK WAVE LITHOTRIPSY (ESWL);  Surgeon: Janith Lima, MD;  Location: [facility];  Service: Urology;  Laterality: Left;  90 MINS   KNEE SURGERY Right    arthroscopic   NECK SURGERY     PACEMAKER INSERTION      Allergies  Allergies  Allergen Reactions   Flagyl [Metronidazole] Anaphylaxis   Niacin Other (See Comments)   Niacin And Related Other (See Comments) and Cough    flushing   Penicillins Swelling and Rash    Has patient had a PCN reaction causing immediate rash, facial/tongue/throat swelling, SOB or lightheadedness with hypotension: No Has patient had a PCN reaction causing severe rash involving mucus membranes or skin necrosis: No Has patient had a PCN reaction that required hospitalization No Has patient had a PCN reaction occurring within the last 10 years: Yes 3-4 years ago If all of the above answers are "NO", then may proceed with Cephalosporin use.   Yeast-Related Products Swelling and Rash    History of Present Illness    [name] has a PMH of nonischemic cardiomyopathy, OSA on CPAP, LBBB status post PPM insertion 2013 with generator change out in 2015, hyperlipidemia, and kidney stones.  She was seen in follow-up 4/18.  During that time she noted increased stress.  She was seen by Dr. Lovena Le 1/18 and was admitted for increased shortness of breath with exertion.  She had a echocardiogram 3/18 which showed an LVEF of 55-60% and G2 DD.   Her EKG at that time showed normal sinus rhythm with biventricular pacing.  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  She was placed on Valtrex and had an improvement in her symptoms.  She was seen again in follow-up 09/05/2017.  She had lost approximately 36 pounds over the previous 5 months.  She had gained about 10 pounds back.  She denied any recent chest pain or edema.  She saw Dr. Lovena Le for her 1 year follow-up 1/19.  She initially underwent biventricular ICD implantation for nonischemic cardiomyopathy but had normalization of her LV function and underwent a downgrade to her biventricular PPM in 2015.  She reported compliance with her CPAP.  She was seen in follow-up by Fabian Sharp 9/20 and again saw Dr. Lovena Le 2/21.  She followed up with Dr. Claiborne Billings 02/18/2021.  During that time she reported that she had stopped CPAP therapy.  She reported that she had lost approximately 80 pounds over the previous 18 months.  She was not exercising and admitted to less endurance.  She had a difficult year with kidney stones and also noted COVID infection 6/22.  She reported chronic neck pain from a previous injury.  She denied exertional chest pain.  Her echocardiogram 09/04/2021 showed LV function of 50-55%, G1 DD, trivial mitral valve regurgitation, and mild dilation of the ascending aorta measuring 38 mm.  She presents to the clinic today for follow-up evaluation and states***  *** denies chest pain, shortness of breath, lower extremity edema, fatigue, palpitations, melena, hematuria, hemoptysis, diaphoresis, weakness, presyncope, syncope, orthopnea, and PND.  Nonischemic cardiomyopathy-no increased DOE or activity intolerance.  Continues to be successful with weight loss.  11/15 she underwent generator change out and downgraded to her PPM rather than ICD.  Echocardiogram 6/23 showed LV function of 50-55%, G1 DD, trivial mitral valve regurgitation. Continue carvedilol, losartan,  spironolactone Heart healthy low-sodium diet-salty 6 given Increase physical activity as tolerated  Hyperlipidemia-LDL*** Continue aspirin, rosuvastatin Heart healthy low-sodium diet-salty 6 given Increase physical activity as tolerated  PPM-underwent generator change out and had downgraded to CRT-P 2015.   Follows with the EP  OSA-reports compliance with CPAP.  Continues to wake up well rested. Continue CPAP use Maintain weight  Disposition: Follow-up with Dr. Claiborne Billings in December.  Home Medications    Prior to Admission medications   Medication Sig Start Date End Date Taking? Authorizing Provider  aspirin 81 MG tablet Take 162 mg by mouth at bedtime.     [provider]  aspirin-acetaminophen-caffeine (EXCEDRIN MIGRAINE) [phone] MG per tablet Take 2 tablets by mouth daily as needed for migraine.    [provider]  carvedilol (COREG) 12.5 MG tablet TAKE 1 TABLET(12.5 MG) BY MOUTH TWICE DAILY 07/24/21   Troy Sine, MD  diazepam (VALIUM) 5 MG tablet Take 1/2 to 1 tablet at Bedtime ONLY if needed 01/28/21   Liane Comber, NP  estradiol (ESTRACE) 1 MG tablet Take 1.5 tablets by mouth daily. 05/21/16   [provider]  fenofibrate micronized (LOFIBRA) 134 MG capsule  Take 1 capsule (134 mg total) by mouth daily before breakfast. 03/30/21   Duke, Roe Rutherford, PA  imipramine (TOFRANIL) 25 MG tablet TAKE 1 TO 2 TABLETS BY MOUTH AT BEDTIME AS NEEDED FOR SLEEP 07/27/21   Raynelle Dick, NP  loratadine (CLARITIN) 10 MG tablet Take 10 mg by mouth daily.    [provider]  losartan (COZAAR) 25 MG tablet Take 1 tablet (25 mg total) by mouth daily. 03/30/21 06/28/21  Marcelino Duster, PA  meloxicam (MOBIC) 15 MG tablet Take one daily with food for 2 weeks, can take with tylenol, can not take with aleve, iburpofen, then as needed daily for pain 04/30/20   Judd Gaudier, NP  metFORMIN (GLUCOPHAGE XR) 500 MG 24 hr tablet Take 4 tabs daily as tolerated,  spread over meals. 01/28/21   Judd Gaudier, NP  phentermine (ADIPEX-P) 37.5 MG tablet Take 1/2 to 1 tablet every morning for dieting & weightloss 06/17/21   Judd Gaudier, NP  Potassium 99 MG TABS Take 0.5 tablets (49.5 mg total) by mouth daily. 11/08/18   [facility], Roe Rutherford, PA  Pseudoeph-Doxylamine-DM-APAP (DAYQUIL/NYQUIL COLD/FLU RELIEF PO) Take by mouth.    [provider]  rizatriptan (MAXALT) 10 MG tablet TAKE 1 TABLET BY MOUTH FOR SEVERE MIGRAINE AND MAY REPEAT IN 2 HOURS IF NEEDED(MAX 2 TABLETS PER 24 HOURS) 02/05/21   Judd Gaudier, NP  rosuvastatin (CRESTOR) 40 MG tablet Take 1 tablet (40 mg total) by mouth daily. TAKE 1 TABLET(40 MG) BY MOUTH DAILY. 03/30/21   Marcelino Duster, PA  spironolactone (ALDACTONE) 25 MG tablet TAKE 1 TABLET(25 MG) BY MOUTH TWICE DAILY 09/07/21   Lennette Bihari, MD  tamsulosin [facility]) 0.4 MG CAPS capsule Take  1 capsule  Daily  for Bladder 06/22/20   Lucky Cowboy, MD  terbinafine (LAMISIL) 250 MG tablet Take 1 tab daily for 1 month, skip for 1 month and repeat for total of 3 cycles. 07/07/21   Judd Gaudier, NP  tirzepatide [facility]) 5 MG/0.5ML Pen Inject 5 mg into the skin once a week. 06/17/21   Judd Gaudier, NP  valACYclovir (VALTREX) 500 MG tablet TAKE 1 TABLET(500 MG) BY MOUTH DAILY 09/13/16   Lucky Cowboy, MD  verapamil (CALAN-SR) 240 MG CR tablet TAKE 1 TABLET(240 MG) BY MOUTH AT BEDTIME 06/19/21   Duke, Roe Rutherford, PA  VITAMIN D PO Take 2,000 Units by mouth.    [provider]    Family History    Family History  Problem Relation Age of Onset   Cancer - Cervical Mother        precancerous cells   Colon polyps Mother    Valvular heart disease Father        late 69s, died after surgery   Hearing loss Father    Hypertension Father    Colon cancer Maternal Grandfather 87   Colon cancer Maternal Uncle    She indicated that her mother is alive. She indicated that her father is deceased. She indicated that her  sister is alive. She indicated that both of her brothers are alive. She indicated that her maternal grandmother is deceased. She indicated that her maternal grandfather is deceased. She indicated that her paternal grandmother is deceased. She indicated that her paternal grandfather is deceased. She indicated that her maternal uncle is alive.  Social History    Social History   Socioeconomic History   Marital status: Married    Spouse name: Not on file   Number of children: Not  on file   Years of education: Not on file   Highest education level: Not on file  Occupational History   Not on file  Tobacco Use   Smoking status: Former    Types: Cigarettes    Quit date: 03/09/1975    Years since quitting: 46.5   Smokeless tobacco: Never   Tobacco comments:    light smoker for a few months as a teen   Vaping Use   Vaping Use: Never used  Substance and Sexual Activity   Alcohol use: Not Currently   Drug use: No   Sexual activity: Yes    Partners: Male    Birth control/protection: Post-menopausal  Other Topics Concern   Not on file  Social History Narrative   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  Cardiovascular:  No chest pain, dyspnea on exertion, edema, orthopnea, palpitations, paroxysmal nocturnal dyspnea. Dermatological: No rash, lesions/masses Respiratory: No cough, dyspnea Urologic: No hematuria, dysuria Abdominal:   No nausea, vomiting, diarrhea, bright red blood per rectum, melena, or hematemesis Neurologic:  No visual changes, wkns, changes in mental status. All other systems reviewed and are otherwise negative except  as noted above.  Physical Exam    VS:  There were no vitals taken for this visit. , BMI There is no height or weight on file to calculate BMI. GEN: Well nourished, well developed, in no acute distress. HEENT: normal. Neck: Supple, no JVD, carotid bruits, or masses. Cardiac: RRR, no murmurs, rubs, or gallops. No clubbing, cyanosis, edema.  Radials/DP/PT 2+ and equal bilaterally.  Respiratory:  Respirations regular and unlabored, clear to auscultation bilaterally. GI: Soft, nontender, nondistended, BS + x 4. MS: no deformity or atrophy. Skin: warm and dry, no rash. Neuro:  Strength and sensation are intact. Psych: Normal affect.  Accessory Clinical Findings    Recent Labs: 06/17/2021: ALT 16; BUN 18; Creat 0.92; Hemoglobin 13.2; Magnesium 1.5; Platelets 299; Potassium 4.0; Sodium 142; TSH 1.52   Recent Lipid Panel    Component Value Date/Time   CHOL 140 06/17/2021 1704   TRIG 213 (H) 06/17/2021 1704   HDL 40 (L) 06/17/2021 1704   CHOLHDL 3.5 06/17/2021 1704   VLDL 27 11/19/2015 0954   LDLCALC 69 06/17/2021 1704    ECG personally reviewed by me today- *** - No acute changes  Echocardiogram 09/04/2021 IMPRESSIONS     1. Left ventricular ejection fraction, by estimation, is 50 to 55%. The  left ventricle has low normal function. The left ventricle has no regional  wall motion abnormalities. There is mild concentric left ventricular  hypertrophy. Left ventricular  diastolic parameters are consistent with Grade I diastolic dysfunction  (impaired relaxation).   2. Right ventricular systolic function is mildly reduced. The right  ventricular size is normal. Tricuspid regurgitation signal is inadequate  for assessing PA pressure.   3. Moderate pericardial effusion. The pericardial effusion is  circumferential.   4. The mitral valve is normal in structure. Trivial mitral valve  regurgitation. No evidence of mitral stenosis.   5. The aortic valve is tricuspid. Aortic valve  regurgitation is not  visualized.  Aortic valve sclerosis/calcification is present, without any  evidence of aortic stenosis.   6. Aortic dilatation noted. There is mild dilatation of the ascending  aorta, measuring 38 mm.   7. The inferior vena cava is normal in size with greater than 50%  respiratory variability, suggesting right atrial pressure of 3 mmHg.   Assessment & Plan   1.  ***   Thomasene Ripple. Britton Perkinson NP-C     09/13/2021, 4:22 PM [facility] [address] Office [phone] Fax [phone]  Notice: This dictation was prepared with Dragon dictation along with smaller phrase technology. Any transcriptional errors that result from this process are unintentional and may not be corrected upon review.  I spent***minutes examining this patient, reviewing medications, and using patient centered shared decision making involving her cardiac care.  Prior to her visit I spent greater than 20 minutes reviewing her past medical history,  medications, and prior cardiac tests.

## 2021-09-14 ENCOUNTER — Ambulatory Visit (INDEPENDENT_AMBULATORY_CARE_PROVIDER_SITE_OTHER): Payer: BC Managed Care – PPO | Admitting: General Practice

## 2021-09-14 ENCOUNTER — Encounter: Payer: Self-pay | Admitting: General Practice

## 2021-09-14 VITALS — BP 130/70 | HR 82 | Ht 67.0 in | Wt 188.8 lb

## 2021-09-14 DIAGNOSIS — I428 Other cardiomyopathies: Secondary | ICD-10-CM | POA: Diagnosis not present

## 2021-09-14 DIAGNOSIS — E1169 Type 2 diabetes mellitus with other specified complication: Secondary | ICD-10-CM

## 2021-09-14 DIAGNOSIS — I10 Essential (primary) hypertension: Secondary | ICD-10-CM

## 2021-09-14 DIAGNOSIS — G4733 Obstructive sleep apnea (adult) (pediatric): Secondary | ICD-10-CM

## 2021-09-14 DIAGNOSIS — Z950 Presence of cardiac pacemaker: Secondary | ICD-10-CM | POA: Diagnosis not present

## 2021-09-14 DIAGNOSIS — Z9989 Dependence on other enabling machines and devices: Secondary | ICD-10-CM

## 2021-09-14 DIAGNOSIS — E785 Hyperlipidemia, unspecified: Secondary | ICD-10-CM

## 2021-09-14 NOTE — Patient Instructions (Signed)
Medication Instructions:  The current medical regimen is effective;  continue present plan and medications as directed. Please refer to the Current Medication list given to you today.   *If you need a refill on your cardiac medications before your next appointment, please call your pharmacy*  Lab Work:   Testing/Procedures:  NONE    NONE  If you have labs (blood work) drawn today and your tests are completely normal, you will receive your results only by: MyChart Message (if you have MyChart) OR  A paper copy in the mail If you have any lab test that is abnormal or we need to change your treatment, we will call you to review the results.  Special Instructions  PLEASE INCREASE PHYSICAL ACTIVITY AS TOLERATED   Follow-Up: Your next appointment:  9 month(s) In Person with Nicki Guadalajara, MD    Please call our office 2 months in advance to schedule this appointment  :1  At [facility], you and your health needs are our priority.  As part of our continuing mission to provide you with exceptional heart care, we have created designated Provider Care Teams.  These Care Teams include your primary Cardiologist (physician) and Advanced Practice Providers (APPs -  Physician Assistants and Nurse Practitioners) who all work together to provide you with the care you need, when you need it.  Important Information About Sugar

## 2021-09-29 ENCOUNTER — Ambulatory Visit (INDEPENDENT_AMBULATORY_CARE_PROVIDER_SITE_OTHER): Payer: BC Managed Care – PPO

## 2021-09-29 DIAGNOSIS — I428 Other cardiomyopathies: Secondary | ICD-10-CM | POA: Diagnosis not present

## 2021-09-29 LAB — CUP PACEART REMOTE DEVICE CHECK
Battery Remaining Longevity: 12 mo
Battery Remaining Percentage: 13 %
Battery Voltage: 2.86 V
Brady Statistic AP VP Percent: 2.1 %
Brady Statistic AP VS Percent: 1 %
Brady Statistic AS VP Percent: 98 %
Brady Statistic AS VS Percent: 1 %
Brady Statistic RA Percent Paced: 2 %
Date Time Interrogation Session: 20230725051807
Implantable Lead Implant Date: 20091016
Implantable Lead Implant Date: 20091016
Implantable Lead Implant Date: 20091016
Implantable Lead Location: 753858
Implantable Lead Location: 753859
Implantable Lead Location: 753860
Implantable Lead Model: 4196
Implantable Lead Model: 7122
Implantable Pulse Generator Implant Date: 20151111
Lead Channel Impedance Value: 390 Ohm
Lead Channel Impedance Value: 580 Ohm
Lead Channel Impedance Value: 780 Ohm
Lead Channel Pacing Threshold Amplitude: 0.25 V
Lead Channel Pacing Threshold Amplitude: 1 V
Lead Channel Pacing Threshold Amplitude: 1.25 V
Lead Channel Pacing Threshold Pulse Width: 0.3 ms
Lead Channel Pacing Threshold Pulse Width: 0.3 ms
Lead Channel Pacing Threshold Pulse Width: 0.6 ms
Lead Channel Sensing Intrinsic Amplitude: 3.8 mV
Lead Channel Sensing Intrinsic Amplitude: 8.5 mV
Lead Channel Setting Pacing Amplitude: 2 V
Lead Channel Setting Pacing Amplitude: 2.5 V
Lead Channel Setting Pacing Amplitude: 2.5 V
Lead Channel Setting Pacing Pulse Width: 0.3 ms
Lead Channel Setting Pacing Pulse Width: 0.6 ms
Lead Channel Setting Sensing Sensitivity: 2 mV
Pulse Gen Model: 3222
Pulse Gen Serial Number: 7688750

## 2021-10-07 ENCOUNTER — Ambulatory Visit: Payer: BC Managed Care – PPO | Admitting: Cardiovascular Disease

## 2021-10-27 ENCOUNTER — Encounter: Payer: Self-pay | Admitting: Internal Medicine

## 2021-10-27 NOTE — Progress Notes (Signed)
Remote pacemaker transmission.   

## 2021-12-09 ENCOUNTER — Other Ambulatory Visit: Payer: Self-pay | Admitting: *Deleted

## 2021-12-09 MED ORDER — LOSARTAN POTASSIUM 25 MG PO TABS: 25.0000 mg | ORAL_TABLET | Freq: Every day | ORAL | 3 refills | Status: DC

## 2021-12-29 ENCOUNTER — Ambulatory Visit (INDEPENDENT_AMBULATORY_CARE_PROVIDER_SITE_OTHER): Payer: BC Managed Care – PPO

## 2021-12-29 DIAGNOSIS — I428 Other cardiomyopathies: Secondary | ICD-10-CM

## 2021-12-29 LAB — CUP PACEART REMOTE DEVICE CHECK
Battery Remaining Longevity: 10 mo
Battery Remaining Percentage: 11 %
Battery Voltage: 2.86 V
Brady Statistic AP VP Percent: 1.8 %
Brady Statistic AP VS Percent: 1 %
Brady Statistic AS VP Percent: 98 %
Brady Statistic AS VS Percent: 1 %
Brady Statistic RA Percent Paced: 1.7 %
Date Time Interrogation Session: 20231022125921
Implantable Lead Connection Status: 753985
Implantable Lead Connection Status: 753985
Implantable Lead Connection Status: 753985
Implantable Lead Implant Date: 20091016
Implantable Lead Implant Date: 20091016
Implantable Lead Implant Date: 20091016
Implantable Lead Location: 753858
Implantable Lead Location: 753859
Implantable Lead Location: 753860
Implantable Lead Model: 4196
Implantable Lead Model: 7122
Implantable Pulse Generator Implant Date: 20151111
Lead Channel Impedance Value: 390 Ohm
Lead Channel Impedance Value: 530 Ohm
Lead Channel Impedance Value: 780 Ohm
Lead Channel Pacing Threshold Amplitude: 0.25 V
Lead Channel Pacing Threshold Amplitude: 1 V
Lead Channel Pacing Threshold Amplitude: 1.25 V
Lead Channel Pacing Threshold Pulse Width: 0.3 ms
Lead Channel Pacing Threshold Pulse Width: 0.3 ms
Lead Channel Pacing Threshold Pulse Width: 0.6 ms
Lead Channel Sensing Intrinsic Amplitude: 3.7 mV
Lead Channel Sensing Intrinsic Amplitude: 8.5 mV
Lead Channel Setting Pacing Amplitude: 2 V
Lead Channel Setting Pacing Amplitude: 2.5 V
Lead Channel Setting Pacing Amplitude: 2.5 V
Lead Channel Setting Pacing Pulse Width: 0.3 ms
Lead Channel Setting Pacing Pulse Width: 0.6 ms
Lead Channel Setting Sensing Sensitivity: 2 mV
Pulse Gen Model: 3222
Pulse Gen Serial Number: 7688750

## 2022-01-04 ENCOUNTER — Other Ambulatory Visit: Payer: Self-pay

## 2022-01-04 DIAGNOSIS — B351 Tinea unguium: Secondary | ICD-10-CM

## 2022-01-04 MED ORDER — TERBINAFINE HCL 250 MG PO TABS: ORAL_TABLET | ORAL | 0 refills | Status: DC

## 2022-01-11 ENCOUNTER — Encounter: Payer: Self-pay | Admitting: Cardiovascular Disease

## 2022-01-11 DIAGNOSIS — I10 Essential (primary) hypertension: Secondary | ICD-10-CM

## 2022-01-12 MED ORDER — VERAPAMIL HCL ER 240 MG PO TBCR: EXTENDED_RELEASE_TABLET | ORAL | 1 refills | Status: DC

## 2022-01-18 NOTE — Progress Notes (Signed)
Remote pacemaker transmission.   

## 2022-01-27 ENCOUNTER — Encounter: Payer: Self-pay | Admitting: Internal Medicine

## 2022-02-02 ENCOUNTER — Encounter: Payer: BC Managed Care – PPO | Admitting: Adult Health

## 2022-02-03 ENCOUNTER — Ambulatory Visit: Payer: BC Managed Care – PPO | Admitting: Cardiovascular Disease

## 2022-02-05 ENCOUNTER — Other Ambulatory Visit: Payer: Self-pay

## 2022-02-05 DIAGNOSIS — E1122 Type 2 diabetes mellitus with diabetic chronic kidney disease: Secondary | ICD-10-CM

## 2022-02-05 MED ORDER — METFORMIN HCL ER 500 MG PO TB24: ORAL_TABLET | ORAL | 3 refills | Status: DC

## 2022-02-16 ENCOUNTER — Ambulatory Visit
Admission: RE | Admit: 2022-02-16 | Discharge: 2022-02-16 | Disposition: A | Discharge status: Home / Self Care | Payer: BC Managed Care – PPO | Source: Ambulatory Visit | Attending: Obstetrics | Admitting: Obstetrics

## 2022-02-16 DIAGNOSIS — N6489 Other specified disorders of breast: Secondary | ICD-10-CM

## 2022-02-17 ENCOUNTER — Encounter: Payer: BC Managed Care – PPO | Admitting: Nurse Practitioner

## 2022-02-21 ENCOUNTER — Other Ambulatory Visit: Payer: Self-pay | Admitting: Nurse Practitioner

## 2022-02-21 DIAGNOSIS — F411 Generalized anxiety disorder: Secondary | ICD-10-CM

## 2022-03-11 ENCOUNTER — Telehealth: Payer: Self-pay | Admitting: Nurse Practitioner

## 2022-03-11 NOTE — Telephone Encounter (Signed)
Pt saw [name] last and hasn't been seen since April. She was eating soup and got a small chicken bone stuck in her throat. She says she can breathe, talk, and has been able to drink a protein shake since it happened. Wanting to know what she should do.Marland KitchenMarland KitchenMarland KitchenPlease advise.

## 2022-03-15 ENCOUNTER — Encounter: Payer: Self-pay | Admitting: Internal Medicine

## 2022-03-22 NOTE — Progress Notes (Unsigned)
Cardiology Office Note:    Date:  03/22/2022   ID:  DEREON THRELKELD, DOB 08-27-1955, MRN [id number]  PCP:  Unk Pinto, MD  Cardiologist:  Shelva Majestic, MD  Electrophysiologist:  Cristopher [place], MD   Referring MD: Unk Pinto, MD   Chief Complaint: routine follow-up of non-ischemic cardiomyopathy   History of Present Illness:    [name] is a 67 y.o. female with a history of mild non-obstructive CAD on cardiac catheterization in 07/2006, non-ischemic cardiomyopathy with EF as low as 25-30% in the past with subsequent normalization of EF (LVEF 50-55% on last Echo in 08/2021) , LBBB, s/p St. Jude BiV ICD in 12/2007 with downgrade to BiV PPM at time of gen change in 01/2014 due to normalization of LV function, hypertension, hyperlipidemia, type 2 diabetes mellitus, and obstructive sleep apnea on CPAP who is followed by Dr. Claiborne Billings and Dr. Lovena Le and presents today for routine follow-up.  Patient has a long history of non-ischemic cardiomyopathy and LBBB. Initial diagnosed in 2008 when she was found to have a LVEF of 25-30%. Cardiac catheterization 07/2006 showed mild non-obstructive CAD with 30-40% stenosis of the mid LAD and mild luminal irregularities in the RCA and LCX but no obstructive disease. She underwent placement of St. Jude BiV ICD in 12/2007. LVEF subsequently normalized so at time of gen change in 01/2014, device was downgraded to BiV PPM. Last Echo in 08/2021 showed LVEF of 50-55% with no regional wall motion abnormalities, mild concentric LVH, and grade 1 diastolic dysfunction as well as mildly reduced RV systolic function and moderate pericardial effusion with no signs of cardiac tamponade. Patient was last seen by Coletta Memos, NP, in 09/2021 at which time she reported about an 18 lb weight gain (after losing 80 lbs) and some increased work of breathing with physical activity but was otherwise doing well from a cardiac standpoint.  Non-Ischemic Cardiomyopathy LVEF as low  as 25-30% in 2008 with subsequent normalization. LHC at time  of initial diagnosis showed non-obstructive disease. Last Echo in 08/2021 showed LVEF of 50-55% with no regional wall motion abnormalities, mild concentric LVH, and grade 1 diastolic dysfunction as well as mildly reduced RV systolic function and moderate pericardial effusion with no signs of cardiac tamponade.  - Euvolemic on exam. *** - Continue Losartan 25mg  daily. - Continue Coreg 12.5mg  twice daily.  - Continue Spironolactone 25mg  daily. - Discussed importance of daily weight and sodium/fluid restrictions.  Non-Obstructive CAD LBBB LHC in 07/2006 showed mild non-obstructive disease with 30-40% stenosis of the mid LAD and mild luminal irregularities in the RCA and LCX but no obstructive disease. Myoview in 2016 was low risk. - No chest pain.  - Continue aspirin, beta-blocker, and high-intensity statin. Of note, it look like patient is taking Aspirin 162mg  daily. Will decrease this to 81mg  daily.  S/p St. Jude BiV PPM Patient initially underwent placement of St. Jude BiV ICD in 2009 for primary prevention of sudden cardiac death given non-ischemic cardiomyopathy and LBBB. It was downgraded to a BiV PPM in 2015 at time of generator change given normalization of EF. - Followed by Dr. Lovena Le.  Pericardial Effusion Moderate pericardial effusion was noted on last Echo in 08/2021. There was no sign of cardiac tamponade at that time. ***  Hypertension BP well controlled.  - Continue current medications: Losartan 25mg  daily, Coreg 12.5mg  twice daily, Spironolactone 40mg  daily, and Verapamil 240mg  daily.   Hyperlipidemia Last lipid panel in 06/2021: Total Cholesterol 140, Triglycerides 213, HDL 40,  LDL 67. LDL goal <70 given history of non-obstructive CAD. - Continue Crestor 40mg  daily and Lofibra 134mg  daily. - Labs followed by PCP. If triglycerides still elevated at time of next check, consider adding Vascepa.   Type 2 Diabetes  [place] Hemoglobin A1c 7.3 in 06/2021.  - On Metformin.  - Management per PCP.  Obstructive Sleep Apnea - Continue CPAP. ***  Past Medical History:  Diagnosis Date   Automatic implantable cardiac defibrillator in situ    [employer]. [place]   CHF (congestive heart failure) ([place])    Diabetes mellitus    Hyperlipidemia    Hypertension 06/25/2011   Renal dopplers - superior mesenteric artery >50% diameter reduction; R renal artery - normal patency; L proximal renal artery 1-59% reduction (lower end of scale); both kidneys normal in size/symmetry with normal cortex and medulla   Leg pain 12/09/2010   doppler of R femoral artery - no evidence of dissection, AV fistula, pseudoaneurysm or other vascular abnormalities   Migraines    Obesity    PONV (postoperative nausea and vomiting)    Sleep apnea    on CPAP - AHI during sleep study was 44   Type II or unspecified type diabetes mellitus without mention of complication, not stated as uncontrolled    Type II or unspecified type diabetes mellitus without mention of complication, not stated as uncontrolled    Unspecified sleep apnea     Past Surgical History:  Procedure Laterality Date   ABDOMINAL HYSTERECTOMY     total -    BIV PACEMAKER GENERATOR CHANGE OUT N/A 01/16/2014   Procedure: BIV PACEMAKER GENERATOR CHANGE OUT;  Surgeon: Evans Lance, MD;  Location: [facility] CATH LAB;  Service: Cardiovascular;  Laterality: N/A;   BREAST BIOPSY Left 2019   x2   CARDIAC CATHETERIZATION  07/15/2006   no significant CAD by cardiac cath, confirmed by intravascular [place] of LAD; non-ischemic cardiomyopathy prob related to uncontrolled hypertension, DM and morbid obesity; moderate pulmonary hypertension; preserved cardiac output and cardiac index   CARDIAC DEFIBRILLATOR PLACEMENT     ICD by Dr. Lovena Le   CARDIOVASCULAR STRESS TEST  08/28/2010   R/P MV - pattern of normal perfusion in all regions, no scintigraphic evidence of inducible myocardial ischemia; no  EKG changes; normal perfusion study; pt did experience chesst pain during strudy, resolved spontaneously   DOPPLER ECHOCARDIOGRAPHY  06/25/2011   EF >55%; moderate concentric LV hypertrophy; LA mildly dilated, mild mitral annular calcification;    EXTRACORPOREAL SHOCK WAVE LITHOTRIPSY Left 06/09/2020   Procedure: EXTRACORPOREAL SHOCK WAVE LITHOTRIPSY (ESWL);  Surgeon: Janith Lima, MD;  Location: [facility];  Service: Urology;  Laterality: Left;  90 MINS   KNEE SURGERY Right    arthroscopic   NECK SURGERY     PACEMAKER INSERTION      Current Medications: No outpatient medications have been marked as taking for the 04/05/22 encounter (Appointment) with Darreld Mclean, PA-C.     Allergies:   Flagyl [metronidazole], Penicillins, Niacin, Miconazole nitrate, Niacin and related, and Yeast-related products   Social History   Socioeconomic History   Marital status: Married    Spouse name: Not on file   Number of children: Not on file   Years of education: Not on file   Highest education level: Not on file  Occupational History   Not on file  Tobacco Use   Smoking status: Former    Types: Cigarettes    Quit date: 03/09/1975    Years  since quitting: 47.0   Smokeless tobacco: Never   Tobacco comments:    light smoker for a few months as a teen   Vaping Use   Vaping Use: Never used  Substance and Sexual Activity   Alcohol use: Not Currently   Drug use: No   Sexual activity: Yes    Partners: Male    Birth control/protection: Post-menopausal  Other Topics Concern   Not on file  Social History Narrative   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  ROS:   Please see the history of present illness.     EKGs/Labs/Other Studies Reviewed:    The following studies were reviewed:  Echocardiogram 09/04/2021: Impressions: 1. Left ventricular ejection fraction, by estimation, is 50 to 55%. The  left ventricle has low normal function. The left ventricle has no regional  wall motion abnormalities. There is mild concentric left ventricular  hypertrophy. Left ventricular  diastolic parameters are consistent with Grade I diastolic dysfunction  (impaired relaxation).   2. Right ventricular systolic function is mildly reduced. The right  ventricular size is normal. Tricuspid regurgitation signal is inadequate  for assessing PA pressure.   3. Moderate pericardial effusion. The pericardial effusion is  circumferential.   4. The mitral valve is normal in structure. Trivial mitral valve  regurgitation. No evidence of mitral stenosis.   5. The aortic valve is tricuspid. Aortic valve regurgitation is not  visualized. Aortic valve sclerosis/calcification is present, without any  evidence of aortic stenosis.   6. Aortic dilatation noted. There is mild dilatation of the ascending  aorta, measuring 38 mm.   7. The inferior vena cava is normal in size with greater than 50%  respiratory variability, suggesting right atrial pressure of 3 mmHg.   EKG:  EKG ordered today. EKG personally reviewed and demonstrates ***.  Recent Labs: 06/17/2021: ALT 16; BUN 18; Creat 0.92; Hemoglobin 13.2; Magnesium 1.5; Platelets 299; Potassium 4.0; Sodium 142; TSH 1.52  Recent Lipid Panel    Component Value Date/Time   CHOL 140 06/17/2021 1704   TRIG 213 (H) 06/17/2021 1704   HDL 40 (L) 06/17/2021 1704   CHOLHDL 3.5 06/17/2021 1704   VLDL 27 11/19/2015  0954   LDLCALC 69 06/17/2021 1704    Physical Exam:    Vital Signs: There were no vitals taken for this visit.    Wt Readings from Last 3 Encounters:  09/14/21 188 lb 12.8 oz (85.6 kg)  06/17/21 193 lb (87.5 kg)  06/08/21 190 lb 9.6 oz (86.5 kg)     General: 67 y.o. female in no acute distress. HEENT: Normocephalic and atraumatic. Sclera clear. EOMs intact. Neck: Supple. No carotid bruits. No JVD. Heart: *** RRR. Distinct S1 and S2. No murmurs, gallops, or rubs. Radial and distal pedal pulses 2+ and equal bilaterally. Lungs: No increased work of breathing. Clear to ausculation bilaterally. No wheezes, rhonchi, or rales.  Abdomen: Soft, non-distended, and non-tender to palpation. Bowel sounds present  in all 4 quadrants.  MSK: Normal strength and tone for age. *** Extremities: No lower extremity edema.    Skin: Warm and dry. Neuro: Alert and oriented x3. No focal deficits. Psych: Normal affect. Responds appropriately.   Assessment:    No diagnosis found.  Plan:     Disposition: Follow up in ***   Medication Adjustments/Labs and Tests Ordered: Current medicines are reviewed at length with the patient today.  Concerns regarding medicines are outlined above.  No orders of the defined types were placed in this encounter.  No orders of the defined types were placed in this encounter.   There are no Patient Instructions on file for this visit.   Signed, Darreld Mclean, PA-C  03/22/2022 10:45 AM    [place]

## 2022-03-23 ENCOUNTER — Encounter: Payer: Self-pay | Admitting: Nurse Practitioner

## 2022-03-23 MED ORDER — BLOOD GLUCOSE MONITOR KIT: PACK | 0 refills | Status: DC

## 2022-03-30 ENCOUNTER — Encounter: Payer: Self-pay | Admitting: Internal Medicine

## 2022-03-30 ENCOUNTER — Ambulatory Visit: Payer: BC Managed Care – PPO | Attending: Internal Medicine

## 2022-03-30 DIAGNOSIS — I428 Other cardiomyopathies: Secondary | ICD-10-CM | POA: Diagnosis not present

## 2022-03-30 LAB — CUP PACEART REMOTE DEVICE CHECK
Battery Remaining Longevity: 9 mo
Battery Remaining Percentage: 9 %
Battery Voltage: 2.84 V
Brady Statistic AP VP Percent: 1.6 %
Brady Statistic AP VS Percent: 1 %
Brady Statistic AS VP Percent: 98 %
Brady Statistic AS VS Percent: 1 %
Brady Statistic RA Percent Paced: 1.5 %
Date Time Interrogation Session: 20240122193148
Implantable Lead Connection Status: 753985
Implantable Lead Connection Status: 753985
Implantable Lead Connection Status: 753985
Implantable Lead Implant Date: 20091016
Implantable Lead Implant Date: 20091016
Implantable Lead Implant Date: 20091016
Implantable Lead Location: 753858
Implantable Lead Location: 753859
Implantable Lead Location: 753860
Implantable Lead Model: 4196
Implantable Lead Model: 7122
Implantable Pulse Generator Implant Date: 20151111
Lead Channel Impedance Value: 390 Ohm
Lead Channel Impedance Value: 540 Ohm
Lead Channel Impedance Value: 740 Ohm
Lead Channel Pacing Threshold Amplitude: 0.25 V
Lead Channel Pacing Threshold Amplitude: 1 V
Lead Channel Pacing Threshold Amplitude: 1.25 V
Lead Channel Pacing Threshold Pulse Width: 0.3 ms
Lead Channel Pacing Threshold Pulse Width: 0.3 ms
Lead Channel Pacing Threshold Pulse Width: 0.6 ms
Lead Channel Sensing Intrinsic Amplitude: 10.9 mV
Lead Channel Sensing Intrinsic Amplitude: 4 mV
Lead Channel Setting Pacing Amplitude: 2 V
Lead Channel Setting Pacing Amplitude: 2.5 V
Lead Channel Setting Pacing Amplitude: 2.5 V
Lead Channel Setting Pacing Pulse Width: 0.3 ms
Lead Channel Setting Pacing Pulse Width: 0.6 ms
Lead Channel Setting Sensing Sensitivity: 2 mV
Pulse Gen Model: 3222
Pulse Gen Serial Number: 7688750

## 2022-04-01 ENCOUNTER — Other Ambulatory Visit: Payer: Self-pay | Admitting: Nurse Practitioner

## 2022-04-01 DIAGNOSIS — B351 Tinea unguium: Secondary | ICD-10-CM

## 2022-04-02 NOTE — Progress Notes (Unsigned)
Cardiology Office Note:    Date:  04/05/2022   ID:  [name], DOB 08/13/1955, MRN [id number]  PCP:  Unk Pinto, [place] [facility] Cardiologist:  Shelva Majestic, MD Electrophysiologist:  Cristopher Peru, MD {  Referring MD: Unk Pinto, MD   Chief Complaint  Patient presents with   Follow-up    6 months.   Shortness of Breath   Chest Pain    History of Present Illness:    [name] is a 67 y.o. female with a past medical history of Nonischemic cardiomyopathy (EF as low as 25-30% in the past with subsequent normalization of EF), left bundle branch block s/p Saint Jude BiV ICD in 2009 with downgrade to BiV PPM in 01/2014, mild nonobstructive CAD (2008), hypertension, hyperlipidemia, type 2 diabetes, OSA on CPAP.  Patient is followed by Dr. Claiborne Billings, PPM followed by Dr. Lovena Le.  Today, patient presents for routine follow-up of nonischemic cardiomyopathy.  Patient has a long history of nonischemic cardiomyopathy and left bundle branch block.  She was first diagnosed in 2008 when echocardiogram showed EF of 25-30%.  For further evaluation, underwent cardiac catheterization in 07/2006 that showed mild, nonobstructive CAD.  She had a [place] BiV ICD placed in 12/2007.  She was due for a gen change in 2015.  Echocardiogram on 09/05/2013 showed that EF had improved to 60-65%.  Because EF had recovered, at the time of gen change in 01/2014, device was downgraded to a BiV pacemaker.  Most recent echocardiogram was completed on 09/04/2021 and showed EF 50-55%, no regional wall motion abnormalities, mild LVH, grade 1 diastolic dysfunction, mildly reduced RV systolic function, moderate pericardial effusion without sign of cardiac tamponade.  Patient was most recently seen by cardiology on 09/14/2021 for an outpatient follow-up.  At that time, she noted some increased work of breathing with physical activity.  Her blood pressure was well-controlled and her heart rate was  82 in the office.  Patient was advised to continue her current medication regimen and to increase physical activity as tolerated.  Since last being seen, patient reports that she has been doing very well from a cardiac standpoint.  She has been trying to increase her activity and has restarted an indoor walking program.  She hopes that this will help her lose weight and stay in shape.  She is able to participate in the program without chest pain, or excessive shortness of breath.  She denies lightheadedness, dizziness, syncope, near syncope.  A couple of weeks ago, she had a very restful day at work and felt some chest tightness.  Tightness occurred while she was at rest, associated with anxiety.  Also felt some palpitations.  She went home to rest.  She was able to sleep that night and symptoms were gone by the next morning.  Since then, she has had no recurrence in the symptoms.  She has been able to exercise without chest tightness.  Reports sleeping with her head elevated for comfort, if she lays flat on the ground to do leg exercises she is able to breathe okay.  Denies ankle edema, abdominal distention, fatigue.   Past Medical History:  Diagnosis Date   Automatic implantable cardiac defibrillator in situ    [facility] [place]   CHF (congestive heart failure) ([place])    Diabetes mellitus    Hyperlipidemia    Hypertension 06/25/2011   Renal dopplers - superior mesenteric artery >50% diameter reduction; R renal artery - normal patency;  L proximal renal artery 1-59% reduction (lower end of scale); both kidneys normal in size/symmetry with normal cortex and medulla   Leg pain 12/09/2010   doppler of R femoral artery - no evidence of dissection, AV fistula, pseudoaneurysm or other vascular abnormalities   Migraines    Obesity    PONV (postoperative nausea and vomiting)    Sleep apnea    on CPAP - AHI during sleep study was 44   Type II or unspecified type diabetes mellitus without mention of  complication, not stated as uncontrolled    Type II or unspecified type diabetes mellitus without mention of complication, not stated as uncontrolled    Unspecified sleep apnea     Past Surgical History:  Procedure Laterality Date   ABDOMINAL HYSTERECTOMY     total -    BIV PACEMAKER GENERATOR CHANGE OUT N/A 01/16/2014   Procedure: BIV PACEMAKER GENERATOR CHANGE OUT;  Surgeon: Marinus Maw, MD;  Location[facility] CATH LAB;  Service: Cardiovascular;  Laterality: N/A;   BREAST BIOPSY Left 2019   x2   CARDIAC CATHETERIZATION  07/15/2006   no significant CAD by cardiac cath, confirmed by intravascular [place] of LAD; non-ischemic cardiomyopathy prob related to uncontrolled hypertension, DM and morbid obesity; moderate pulmonary hypertension; preserved cardiac output and cardiac index   CARDIAC DEFIBRILLATOR PLACEMENT     ICD by Dr. Ladona Ridgel   CARDIOVASCULAR STRESS TEST  08/28/2010   R/P MV - pattern of normal perfusion in all regions, no scintigraphic evidence of inducible myocardial ischemia; no EKG changes; normal perfusion study; pt did experience chesst pain during strudy, resolved spontaneously   DOPPLER ECHOCARDIOGRAPHY  06/25/2011   EF >55%; moderate concentric LV hypertrophy; LA mildly dilated, mild mitral annular calcification;    EXTRACORPOREAL SHOCK WAVE LITHOTRIPSY Left 06/09/2020   Procedure: EXTRACORPOREAL SHOCK WAVE LITHOTRIPSY (ESWL);  Surgeon: Jannifer Hick, MD;  Location: [facility];  Service: Urology;  Laterality: Left;  90 MINS   KNEE SURGERY Right    arthroscopic   NECK SURGERY     PACEMAKER INSERTION      Current Medications: Current Meds  Medication Sig   aspirin 81 MG tablet Take 81 mg by mouth at bedtime.   aspirin-acetaminophen-caffeine (EXCEDRIN MIGRAINE) 250-250-65 MG per tablet Take 2 tablets by mouth daily as needed for migraine.   blood glucose meter kit and supplies KIT Dispense based on patient and insurance preference. Use up to four times daily  as directed.   carvedilol (COREG) 12.5 MG tablet TAKE 1 TABLET(12.5 MG) BY MOUTH TWICE DAILY   diazepam (VALIUM) 5 MG tablet Take 1/2 to 1 tablet at Bedtime ONLY if needed   estradiol (ESTRACE) 1 MG tablet Take 1.5 tablets by mouth daily.   fenofibrate micronized (LOFIBRA) 134 MG capsule Take 1 capsule (134 mg total) by mouth daily before breakfast.   imipramine (TOFRANIL) 25 MG tablet TAKE 1 TO 2 TABLETS BY MOUTH AT BEDTIME AS NEEDED FOR SLEEP   loratadine (CLARITIN) 10 MG tablet Take 10 mg by mouth daily.   metFORMIN (GLUCOPHAGE XR) 500 MG 24 hr tablet Take 4 tabs daily as tolerated, spread over meals.   Potassium 99 MG TABS Take 0.5 tablets (49.5 mg total) by mouth daily.   rizatriptan (MAXALT) 10 MG tablet TAKE 1 TABLET BY MOUTH FOR SEVERE MIGRAINE AND MAY REPEAT IN 2 HOURS IF NEEDED(MAX 2 TABLETS PER 24 HOURS)   rosuvastatin (CRESTOR) 40 MG tablet Take 1 tablet (40 mg total) by mouth daily. TAKE  1 TABLET(40 MG) BY MOUTH DAILY.   spironolactone (ALDACTONE) 25 MG tablet TAKE 1 TABLET(25 MG) BY MOUTH TWICE DAILY   terbinafine (LAMISIL) 250 MG tablet Take 1 tab daily for 1 month, skip for 1 month and repeat for total of 3 cycles.   valACYclovir (VALTREX) 500 MG tablet TAKE 1 TABLET(500 MG) BY MOUTH DAILY   verapamil (CALAN-SR) 240 MG CR tablet TAKE 1 TABLET(240 MG) BY MOUTH AT BEDTIME   VITAMIN D PO Take 2,000 Units by mouth.     Allergies:   Flagyl [metronidazole], Penicillins, Niacin, Miconazole nitrate, Niacin and related, and Yeast-related products   Social History   Socioeconomic History   Marital status: Married    Spouse name: Not on file   Number of children: Not on file   Years of education: Not on file   Highest education level: Not on file  Occupational History   Not on file  Tobacco Use   Smoking status: Former    Types: Cigarettes    Quit date: 03/09/1975    Years since quitting: 47.1   Smokeless tobacco: Never   Tobacco comments:    light smoker for a few months as a  teen   Vaping Use   Vaping Use: Never used  Substance and Sexual Activity   Alcohol use: Not Currently   Drug use: No   Sexual activity: Yes    Partners: Male    Birth control/protection: Post-menopausal  Other Topics Concern   Not on file  Social History Narrative   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  ROS:   Please see the history of present illness.     All other systems reviewed and are negative.  EKGs/Labs/Other Studies Reviewed:    The following studies were reviewed today:  Echocardiogram 09/04/21 1. Left ventricular ejection fraction, by estimation, is 50 to 55%. The  left ventricle has low normal function. The left ventricle has no regional  wall motion abnormalities. There is mild concentric left ventricular  hypertrophy. Left ventricular  diastolic parameters are consistent with Grade I diastolic dysfunction  (impaired relaxation).   2. Right ventricular systolic function is mildly reduced. The right  ventricular size is normal. Tricuspid regurgitation signal is inadequate  for assessing PA pressure.   3. Moderate pericardial effusion. The pericardial effusion is  circumferential.   4. The mitral valve is normal in structure. Trivial mitral valve  regurgitation. No evidence of mitral stenosis.   5. The aortic valve is tricuspid. Aortic valve regurgitation is not  visualized. Aortic valve sclerosis/calcification is present,  without any  evidence of aortic stenosis.   6. Aortic dilatation noted. There is mild dilatation of the ascending  aorta, measuring 38 mm.   7. The inferior vena cava is normal in size with greater than 50%  respiratory variability, suggesting right atrial pressure of 3 mmHg.    EKG:  EKG is  ordered today.  The ekg ordered today demonstrates atrial-sensed,  V paced rhythm, with HR 90 BPM  Recent Labs: 06/17/2021: ALT 16; BUN 18; Creat 0.92; Hemoglobin 13.2; Magnesium 1.5; Platelets 299; Potassium 4.0; Sodium 142; TSH 1.52  Recent Lipid Panel    Component Value Date/Time   CHOL 140 06/17/2021 1704   TRIG 213 (H) 06/17/2021 1704   HDL 40 (L) 06/17/2021 1704   CHOLHDL 3.5 06/17/2021 1704   VLDL 27 11/19/2015 0954   LDLCALC 69 06/17/2021 1704     Risk Assessment/Calculations:                Physical Exam:    VS:  BP 118/74 (BP Location: Left Arm, Patient Position: Sitting, Cuff Size: Normal)   Pulse 90   Ht 5\' 7"  (1.702 m)   Wt 183 lb (83 kg)   BMI 28.66 kg/m     Wt Readings from Last 3 Encounters:  04/05/22 183 lb (83 kg)  09/14/21 188 lb 12.8 oz (85.6 kg)  06/17/21 193 lb (87.5 kg)     GEN: Well nourished, well developed in no acute distress. Sitting comfortably on the exam table  HEENT: Normal NECK: No JVD; No carotid bruits CARDIAC: RRR, no murmurs, rubs, gallops. Radial pulses 2+ bilaterally  RESPIRATORY:  Clear to auscultation without rales, wheezing or rhonchi. Normal WOB on room air  ABDOMEN: Soft, non-tender, non-distended MUSCULOSKELETAL:  No edema in bilateral lower extremities; No deformity  SKIN: Warm and dry NEUROLOGIC:  Alert and oriented x 3 PSYCHIATRIC:  Normal affect   ASSESSMENT:    1. Nonischemic cardiomyopathy ([place])   2. Heart failure with improved ejection fraction (HFimpEF) ([place])   3. Nonobstructive atherosclerosis of coronary artery   4. Pericardial effusion   5. Essential hypertension   6. Hyperlipidemia associated with type 2  diabetes mellitus ([place])   7. Pacemaker: Initial Saint Jude BiV permanent pacemaker with subsequent generator change/downgrade to CRT-P in 2015 for an ICM, LBBB    PLAN:    In order of problems listed above:  Nonischemic Cardiomyopathy Chronic Heart Failure with improved Ejection Fraction  -In the past, patient had an EF as low as 25-30%.  Left heart catheterization in 2008 showed mild, nonobstructive CAD.  Had a [place] biventricular ICD placed in 2009.  Echo in 2015 showed EF had improved to 60-65%.  Device was downgraded to BiV pacemaker in 2015.  Most recent echocardiogram in 08/2021 showed EF 50-55% - Patient has some shortness of breath when climbing the stairs, but is able to speak through it and it resolves quickly with rest. No shortness of breath while walking on flat ground. No orthopnea, ankle edema, fatigue.  -Continue carvedilol 12.5 mg twice daily, losartan 25 mg daily, spironolactone 25 mg daily -Patient euvolemic on exam - Patient has her annual physical with her PCP in one week. Labs will be drawn at that time, so we will not draw CMP today   Mild nonobstructive CAD -As above, cath in 2009 showed mild, nonobstructive CAD.  Most recent ischemic evaluation was a nuclear stress test in 2016 that was normal, low risk study - A couple of weeks ago, she has only had one episode of mild chest tightness, which was associated with anxiety. Has not had recurrence. She has been working out more frequently, and denies chest pain/tightness on exertion.  - Episode of chest tightness does not sound cardiac in nature. Given patient's ability to exercise without recurrence in symptoms, no further workup needed at this time  -Continue daily aspirin 81 mg daily, carvedilol  12.5 mg twice daily, Crestor 40 mg daily  Pericardial effusion - Most recent echocardiogram from 08/2021 showed a moderate pericardial effusion that was circumferential.  No signs of tamponade - Patient does not appear to  have symptoms - Ordered limited echo to reassess   HTN -BP well-controlled on current regiment, BP 118/74 today   HLD - Lipid panel from 06/2021 showed LDL 69, HDL 40, total cholesterol 140, triglycerides 213 - Continue crestor 40 mg daily, lofibra 134 mg daily  - Followed by PCP, getting labs drawn next week at annual physical   S/P St Jude BiV PPM  -Followed by Dr. Lovena Le -Most recent device check completed on 03/09/2022 showed normal device function, approx 9 months left on battery  - Patient has not been seen by EP since 05/2020. Patient is planning to travel out of the country this summer (late July). Recommended that she see Dr. Lovena Le or EP APP within the next 3-4 months    Medication Adjustments/Labs and Tests Ordered: Current medicines are reviewed at length with the patient today.  Concerns regarding medicines are outlined above.  Orders Placed This Encounter  Procedures   EKG 12-Lead   ECHOCARDIOGRAM LIMITED   No orders of the defined types were placed in this encounter.   Patient Instructions  Medication Instructions:  No changes *If you need a refill on your cardiac medications before your next appointment, please call your pharmacy*   Testing/Procedures: Your physician has requested that you have an echocardiogram. Echocardiography is a painless test that uses sound waves to create images of your heart. It provides your doctor with information about the size and shape of your heart and how well your heart's chambers and valves are working. This procedure takes approximately one hour. There are no restrictions for this procedure. Please do NOT wear cologne, perfume, aftershave, or lotions (deodorant is allowed). Please arrive 15 minutes prior to your appointment time.    Follow-Up: At [facility], you and your health needs are our priority.  As part of our continuing mission to provide you with exceptional heart care, we have created designated Provider Care  Teams.  These Care Teams include your primary Cardiologist (physician) and Advanced Practice Providers (APPs -  Physician Assistants and Nurse Practitioners) who all work together to provide you with the care you need, when you need it.  We recommend signing up for the patient portal called "MyChart".  Sign up information is provided on this After Visit Summary.  MyChart is used to connect with patients for Virtual Visits (Telemedicine).  Patients are able to view lab/test results, encounter notes, upcoming appointments, etc.  Non-urgent messages can be sent to your provider as well.   To learn more about what you can do with MyChart, go to [email or web address].    Your next appointment:    Need appointment with Dr Lovena Le in 3 months (EP)  Appointment with APP or Dr Claiborne Billings in 6 months      Signed, Margie Billet, PA-C  04/05/2022 8:26 AM    [place]

## 2022-04-05 ENCOUNTER — Ambulatory Visit: Payer: BC Managed Care – PPO | Attending: Cardiovascular Disease | Admitting: Cardiology

## 2022-04-05 ENCOUNTER — Encounter: Payer: Self-pay | Admitting: Student

## 2022-04-05 VITALS — BP 118/74 | HR 90 | Ht 67.0 in | Wt 183.0 lb

## 2022-04-05 DIAGNOSIS — I3139 Other pericardial effusion (noninflammatory): Secondary | ICD-10-CM | POA: Diagnosis not present

## 2022-04-05 DIAGNOSIS — I428 Other cardiomyopathies: Secondary | ICD-10-CM | POA: Diagnosis not present

## 2022-04-05 DIAGNOSIS — E785 Hyperlipidemia, unspecified: Secondary | ICD-10-CM

## 2022-04-05 DIAGNOSIS — Z950 Presence of cardiac pacemaker: Secondary | ICD-10-CM

## 2022-04-05 DIAGNOSIS — E1169 Type 2 diabetes mellitus with other specified complication: Secondary | ICD-10-CM

## 2022-04-05 DIAGNOSIS — I2510 Atherosclerotic heart disease of native coronary artery without angina pectoris: Secondary | ICD-10-CM

## 2022-04-05 DIAGNOSIS — I10 Essential (primary) hypertension: Secondary | ICD-10-CM | POA: Diagnosis not present

## 2022-04-05 DIAGNOSIS — I5022 Chronic systolic (congestive) heart failure: Secondary | ICD-10-CM | POA: Diagnosis not present

## 2022-04-05 NOTE — Patient Instructions (Signed)
Medication Instructions:  No changes *If you need a refill on your cardiac medications before your next appointment, please call your pharmacy*   Testing/Procedures: Your physician has requested that you have an echocardiogram. Echocardiography is a painless test that uses sound waves to create images of your heart. It provides your doctor with information about the size and shape of your heart and how well your heart's chambers and valves are working. This procedure takes approximately one hour. There are no restrictions for this procedure. Please do NOT wear cologne, perfume, aftershave, or lotions (deodorant is allowed). Please arrive 15 minutes prior to your appointment time.    Follow-Up: At [facility], you and your health needs are our priority.  As part of our continuing mission to provide you with exceptional heart care, we have created designated Provider Care Teams.  These Care Teams include your primary Cardiologist (physician) and Advanced Practice Providers (APPs -  Physician Assistants and Nurse Practitioners) who all work together to provide you with the care you need, when you need it.  We recommend signing up for the patient portal called "MyChart".  Sign up information is provided on this After Visit Summary.  MyChart is used to connect with patients for Virtual Visits (Telemedicine).  Patients are able to view lab/test results, encounter notes, upcoming appointments, etc.  Non-urgent messages can be sent to your provider as well.   To learn more about what you can do with MyChart, go to [email or web address].    Your next appointment:    Need appointment with Dr Lovena Le in 3 months (EP)  Appointment with APP or Dr Claiborne Billings in 6 months

## 2022-04-12 ENCOUNTER — Encounter: Payer: BC Managed Care – PPO | Admitting: Nurse Practitioner

## 2022-04-14 ENCOUNTER — Other Ambulatory Visit: Payer: Self-pay

## 2022-04-14 MED ORDER — FENOFIBRATE 134 MG PO CAPS: 134.0000 mg | ORAL_CAPSULE | Freq: Every day | ORAL | 11 refills | Status: DC

## 2022-04-21 ENCOUNTER — Ambulatory Visit (INDEPENDENT_AMBULATORY_CARE_PROVIDER_SITE_OTHER): Payer: PPO | Admitting: Nurse Practitioner

## 2022-04-21 ENCOUNTER — Encounter: Payer: Self-pay | Admitting: Nurse Practitioner

## 2022-04-21 VITALS — BP 132/82 | HR 96 | Temp 97.3°F | Ht 65.25 in | Wt 183.8 lb

## 2022-04-21 DIAGNOSIS — F411 Generalized anxiety disorder: Secondary | ICD-10-CM

## 2022-04-21 DIAGNOSIS — E538 Deficiency of other specified B group vitamins: Secondary | ICD-10-CM

## 2022-04-21 DIAGNOSIS — Z1389 Encounter for screening for other disorder: Secondary | ICD-10-CM

## 2022-04-21 DIAGNOSIS — I5189 Other ill-defined heart diseases: Secondary | ICD-10-CM

## 2022-04-21 DIAGNOSIS — Z79899 Other long term (current) drug therapy: Secondary | ICD-10-CM

## 2022-04-21 DIAGNOSIS — Z0000 Encounter for general adult medical examination without abnormal findings: Secondary | ICD-10-CM | POA: Diagnosis not present

## 2022-04-21 DIAGNOSIS — Z0001 Encounter for general adult medical examination with abnormal findings: Secondary | ICD-10-CM

## 2022-04-21 DIAGNOSIS — I5022 Chronic systolic (congestive) heart failure: Secondary | ICD-10-CM

## 2022-04-21 DIAGNOSIS — G43809 Other migraine, not intractable, without status migrainosus: Secondary | ICD-10-CM

## 2022-04-21 DIAGNOSIS — K219 Gastro-esophageal reflux disease without esophagitis: Secondary | ICD-10-CM

## 2022-04-21 DIAGNOSIS — I10 Essential (primary) hypertension: Secondary | ICD-10-CM

## 2022-04-21 DIAGNOSIS — I428 Other cardiomyopathies: Secondary | ICD-10-CM

## 2022-04-21 DIAGNOSIS — R29898 Other symptoms and signs involving the musculoskeletal system: Secondary | ICD-10-CM

## 2022-04-21 DIAGNOSIS — G4733 Obstructive sleep apnea (adult) (pediatric): Secondary | ICD-10-CM

## 2022-04-21 DIAGNOSIS — M2550 Pain in unspecified joint: Secondary | ICD-10-CM

## 2022-04-21 DIAGNOSIS — E1169 Type 2 diabetes mellitus with other specified complication: Secondary | ICD-10-CM

## 2022-04-21 DIAGNOSIS — Z950 Presence of cardiac pacemaker: Secondary | ICD-10-CM

## 2022-04-21 DIAGNOSIS — E559 Vitamin D deficiency, unspecified: Secondary | ICD-10-CM

## 2022-04-21 DIAGNOSIS — E669 Obesity, unspecified: Secondary | ICD-10-CM

## 2022-04-21 DIAGNOSIS — E1122 Type 2 diabetes mellitus with diabetic chronic kidney disease: Secondary | ICD-10-CM

## 2022-04-21 MED ORDER — FREESTYLE LIBRE 3 SENSOR MISC: 1.0000 | 0 refills | Status: DC

## 2022-04-21 MED ORDER — FREESTYLE LIBRE 3 READER DEVI: 1.0000 | 0 refills | Status: DC

## 2022-04-21 NOTE — Progress Notes (Signed)
Complete Physical  Assessment and Plan:  Encounter for Annual Physical Exam with abnormal findings Due annually  Health Maintenance reviewed Healthy lifestyle reviewed and goals set Continue GYN follow up   Essential hypertension Continue carvedilol, spironolactone, losartan Discussed DASH (Dietary Approaches to Stop Hypertension) DASH diet is lower in sodium than a typical American diet. Cut back on foods that are high in saturated fat, cholesterol, and trans fats. Eat more whole-grain foods, fish, poultry, and nuts Remain active and exercise as tolerated daily.  Monitor BP at home-Call if greater than 130/80.  Check CMP/CBC  Nonischemic cardiomyopathy [facility]) Follows with Cardiology, Dr Claiborne Billings Q6 mo Last seen 04/05/22 Limited echo to reassess pericardial effusion from 08/2021. Continue weight loss, monitor weight at home, follow up cardio  Chronic systolic congestive heart failure ([place]) Follows with Cardiology, Dr Claiborne Billings Q 6 mo Last seen 04/05/22 Continue weight loss, monitor weight at home, follow up cardio Patient euvolemic  OSA  She is no longer on CPAP following weight loss,   CKD 2 associated with T2DM ([place]) Continue ARB, bASA, statin Discussed how what you eat and drink can aide in kidney protection. Stay well hydrated. Avoid high salt foods. Avoid NSAIDS. Keep BP and BG well controlled.   Take medications as prescribed. Remain active and exercise as tolerated daily. Maintain weight.  Continue to monitor. Check CMP/GFR/Microablumin   Diabetic autonomic neuropathy associated with type 2 diabetes mellitus (HCC) Continue weight loss Monitor A1c  T2DM with CKD II [facility]) Education: Reviewed 'ABCs' of diabetes management  Discussed goals to be met and/or maintained include A1C (<7) Blood pressure (<130/80) Cholesterol (LDL <70) Continue Eye Exam yearly  Continue Dental Exam Q6 mo Discussed dietary recommendations Discussed Physical Activity  recommendations Foot exam UTD Check A1C   Hyperlipidemia associated with T2DM (HCC) Continue Rosuvastatin, Fenofibrate Discussed lifestyle modifications. Recommended diet heavy in fruits and veggies, omega 3's. Decrease consumption of animal meats, cheeses, and dairy products. Remain active and exercise as tolerated. Continue to monitor. Check lipids/TSH  Overweight- BMI 29 Excellent progress with 80 lb weight loss this past year Discussed appropriate BMI Diet modification. Physical activity. Encouraged/praised to build confidence.   Other migraine without status migrainosus, not intractable Continue Rizatriptan Avoid triggers Stay well hydrated  Gastroesophageal reflux disease, esophagitis presence not specified Off of meds following weight loss No suspected reflux complications ([place]/stricture). Lifestyle modification:  wt loss, avoid meals 2-3h before bedtime. Consider eliminating food triggers:  chocolate, caffeine, EtOH, acid/spicy food.  Diastolic dysfunction Continue weight loss Cardiology following  Biventricular cardiac pacemaker in situ Followed by Dr. Lovena Le Most recent device check completed on 03/09/2022 showed normal device function, approx 9 months left on battery  Patient has not been seen by EP since 05/2020. Patient is planning to travel out of the country this summer (late July). Recommended that she see Dr. Lovena Le or EP APP within the next 3-4 months   Generalized anxiety disorder Continue diazepam PRN, verapamil Reviewed relaxation techniques.  Sleep hygiene. Encouraged personality growth wand development through coping techniques and problem-solving skills. Limit/Decrease/Monitor drug/alcohol intake.    Medication management All medications discussed and reviewed in full. All questions and concerns regarding medications addressed.     Vitamin D deficiency Continue supplement for goal of 60-100 Monitor levels  B12 def Monitor levels    Orders Placed This Encounter  Procedures   CBC with Differential/Platelet   COMPLETE METABOLIC PANEL WITH GFR   Magnesium   Lipid panel   TSH   Hemoglobin A1c  VITAMIN D 25 Hydroxy (Vit-D Deficiency, Fractures)   Urinalysis, Routine w reflex microscopic   Microalbumin / creatinine urine ratio   Vitamin B12   ANA   Rheumatoid factor   Anti-Smith antibody   ANCA screen with reflex titer   Sjogrens syndrome-A extractable nuclear antibody   Insulin, random    Notify office for further evaluation and treatment, questions or concerns if any reported s/s fail to improve.   The patient was advised to call back or seek an in-person evaluation if any symptoms worsen or if the condition fails to improve as anticipated.   Further disposition pending results of labs. Discussed med's effects and SE's.    I discussed the assessment and treatment plan with the patient. The patient was provided an opportunity to ask questions and all were answered. The patient agreed with the plan and demonstrated an understanding of the instructions.  Discussed med's effects and SE's. Screening labs and tests as requested with regular follow-up as recommended.  I provided 30 minutes of face-to-face time during this encounter including counseling, chart review, and critical decision making was preformed.    Future Appointments  Date Time Provider [place]  04/29/2022  8:30 AM [facility] 2 ECHO 4 [id number]  06/22/2022  4:00 PM Evans Lance, MD [facility]  06/29/2022  8:40 AM [facility] DEVICE REMOTES [facility]  09/28/2022  8:40 AM [facility] DEVICE REMOTES [facility]  09/29/2022  4:20 PM Troy Sine, MD [facility] None  04/25/2023  2:00 PM Darrol Jump, NP [facility] None    HPI 67 y.o. female  presents for a complete physical. She has Essential hypertension; Nonischemic cardiomyopathy ([place]); Diastolic dysfunction; Type 2  diabetes with kidney complications ([place]); Migraines; Biventricular cardiac pacemaker in situ; OSA (obstructive sleep apnea); Obesity (BMI 30.0-34.9); GERD (gastroesophageal reflux disease); Generalized anxiety disorder; Peripheral autonomic neuropathy due to diabetes mellitus ([place]); Chronic systolic congestive heart failure ([place]); Hyperlipidemia associated with type 2 diabetes mellitus ([place]); Aortic atherosclerosis ([place]) per CT 07/20/2017; History of kidney stones; CKD stage 2 due to type 2 diabetes mellitus ([place]); History of fusion of cervical spine; Onychomycosis; Body aches; and Other fatigue on their problem list.  She is married, 2 children, 5 grandchildren local.  Her husband is a retired Agricultural consultant.  She is still working but planning to retire in May 2024.  She is working on getting this completed.  Feeling increase stress about retiring.  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  She has a trip planned out of the country in June 2024.  She has OSA and is on CPAP, lost 80 lb and and no longer uses.  Reports that all sx have resolved, declines referral for retest.   Has history of migraines, infrequent, stress triggers, excedrine migraine, maxalt works well for her. Also on imipramine daily for sleep which helps.   She follows with Dr. Ubaldo Glassing, has had several seb cyst around breast, monitoring for now.   She has hx of TAH, follows annually with [facility], she is on estrace, down from 5 mg to 1.5 mg, has tried to reduce further. Has had mammogram for this year 02/2022. [facility] for annual exam 01/25/22.  She is s/p hysterectomy.  She is to continue 1.5 mg estridiol but she may increase to 2 mg.  She has hx of anxiety, prescribed valium, takes 2.5 mg occasionally, 1-2 days a year, typically around the holidays.   Had lithotripsy by Dr. Abner Greenspan 06/2020,  no issues since.   BMI is Body mass index is 30.35 kg/m., she has been working on diet and exercise. She is trying to  get down to 160 lb on home scale, down from peak weight of 240 lb in 08/2018. She is interested in restarting GLP-1 if covered.  Has had Rx for Ozempic in the past but did not start.  Wt Readings from Last 3 Encounters:  04/21/22 183 lb 12.8 oz (83.4 kg)  04/05/22 183 lb (83 kg)  09/14/21 188 lb 12.8 oz (85.6 kg)   Her blood pressure has been controlled at home, today their BP is BP: 132/82 She does workout, does fitness walk program inside.   She denies chest pain, shortness of breath, dizziness.   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 Lovena Le and Dr. Claiborne Billings.  Last echo was 02/2018 with EF 50-55%. She is on BB, ARB, spirolactone, and torsemide. Has follow up in 2 weeks.   She has been working on diet and exercise for Diabetes, she was off insulin and metformin following 80 lb weight loss.  States that she had recent blood work completed for a research study and was found to have A1c of 13.  She is requesting to start GLP-1.  She does not monitor daily BG checks.  Has had Freestyle Libre in the past and requesting to restart using as she will not prick her finger.   With CKD she is on ACE/ARB on cozaar 58m.   With hyperlipidemia is on crestor 40 and fenofibrate   With neuropathy in the past, but improved   With CAD she is on bASA  She no longer follows up with Dr. BChalmers Caterfor her DM   She see's Dr. DBing Plumefor eye exams 01/2020 normal, has scheduled 04/2022  denies hypoglycemia , polydipsia, polyuria and visual disturbances.  Last A1C was:  Lab Results  Component Value Date   HGBA1C 7.3 (H) 06/17/2021   Lab Results  Component Value Date   GFRNONAA 71 09/04/2020   Lab Results  Component Value Date   CHOL 140 06/17/2021   HDL 40 (L) 06/17/2021   LDLCALC 69 06/17/2021   TRIG 213 (H) 06/17/2021   CHOLHDL 3.5 06/17/2021   Patient is on Vitamin D supplement Lab Results  Component Value Date   VD25OH 41  01/28/2021   Not currently on supplement; receptive to starting SL- Lab Results  Component Value Date   VITAMINB12 443 07/30/2019      Current Medications:  Current Outpatient Medications on File Prior to Visit  Medication Sig Dispense Refill   aspirin 81 MG tablet Take 81 mg by mouth at bedtime.     aspirin-acetaminophen-caffeine (EXCEDRIN MIGRAINE) 250-250-65 MG per tablet Take 2 tablets by mouth daily as needed for migraine.     blood glucose meter kit and supplies KIT Dispense based on patient and insurance preference. Use up to four times daily as directed. 1 each 0   carvedilol (COREG) 12.5 MG tablet TAKE 1 TABLET(12.5 MG) BY MOUTH TWICE DAILY 180 tablet 1   diazepam (VALIUM) 5 MG tablet Take 1/2 to 1 tablet at Bedtime ONLY if needed 30 tablet 0   estradiol (ESTRACE) 1 MG tablet Take 1.5 tablets by mouth daily.  4   fenofibrate micronized (LOFIBRA) 134 MG capsule Take 1 capsule (134 mg total) by mouth daily before breakfast. 30 capsule 11   imipramine (TOFRANIL) 25 MG tablet TAKE  1 TO 2 TABLETS BY MOUTH AT BEDTIME AS NEEDED FOR SLEEP 180 tablet 0   loratadine (CLARITIN) 10 MG tablet Take 10 mg by mouth daily.     losartan (COZAAR) 25 MG tablet Take 1 tablet (25 mg total) by mouth daily. 90 tablet 3   Magnesium 400 MG TABS Take by mouth daily.     metFORMIN (GLUCOPHAGE XR) 500 MG 24 hr tablet Take 4 tabs daily as tolerated, spread over meals. 360 tablet 3   Potassium 99 MG TABS Take 0.5 tablets (49.5 mg total) by mouth daily. 330 tablet    rizatriptan (MAXALT) 10 MG tablet TAKE 1 TABLET BY MOUTH FOR SEVERE MIGRAINE AND MAY REPEAT IN 2 HOURS IF NEEDED(MAX 2 TABLETS PER 24 HOURS) 10 tablet 0   rosuvastatin (CRESTOR) 40 MG tablet Take 1 tablet (40 mg total) by mouth daily. TAKE 1 TABLET(40 MG) BY MOUTH DAILY. 30 tablet 5   spironolactone (ALDACTONE) 25 MG tablet TAKE 1 TABLET(25 MG) BY MOUTH TWICE DAILY 180 tablet 2   terbinafine (LAMISIL) 250 MG tablet Take 1 tab daily for 1 month,  skip for 1 month and repeat for total of 3 cycles. 90 tablet 0   valACYclovir (VALTREX) 500 MG tablet TAKE 1 TABLET(500 MG) BY MOUTH DAILY 90 tablet 0   verapamil (CALAN-SR) 240 MG CR tablet TAKE 1 TABLET(240 MG) BY MOUTH AT BEDTIME 90 tablet 1   VITAMIN D PO Take 2,000 Units by mouth.     No current facility-administered medications on file prior to visit.   Health Maintenance:   Immunization History  Administered Date(s) Administered   Influenza Inj Mdck Quad Pf 01/02/2019   Influenza Inj Mdck Quad With Preservative 11/22/2016, 12/22/2017, 01/29/2020   Influenza,inj,Quad PF,6+ Mos 12/06/2017   Influenza-Unspecified 12/06/2013, 11/22/2014, 12/06/2017, 12/19/2020   Moderna SARS-COV2 Booster Vaccination 12/27/2020   PFIZER(Purple Top)SARS-COV-2 Vaccination 05/25/2019, 06/15/2019   Tdap 03/24/2012   Tetanus: 2014 Pneumovax: declines Prevnar 13: due age 85 - Due - will wait until Fall 2024 Flu vaccine: 12/2020 - declines - will wait until Fall 2024 Shingrix: declines Covid 19: 2/2, 2021, pfizer  - booster  12/2020, moderna  Pap/pelvic: Hx. Of TAH, Sept 2021 Dr. Carlis Abbott  Normal, getting PAP every 3 years Due, follows with GYN.   MGM: 02/2022  DEXA: 2006 t -1.3 , declines at this time- will get at 66 Due and defers at this time.    Colonoscopy: 11/09/2016, just had repeat 12/24/2020, - had polyp Recall 3 years Due 2025. EGD: Dec 2007  Last eye: [facility], Dr. Sandre Kitty, has scheduled 04/2021 Last dental: Dr. Benna Dunks, goes q84m last 2023 Last derm: [place], last 2020, getting new provider in that office - no concerns at this time  Patient Care Team: MUnk Pinto MD as PCP - General (Internal Medicine) KTroy Sine MD as PCP - Cardiology (Cardiology) TEvans Lance MD as PCP - Electrophysiology (Cardiology) KTroy Sine MD as Consulting Physician (Cardiology) BCharlies Silvers MD (Inactive) as Consulting Physician (Pediatrics) BRonald Lobo MD as Consulting  Physician (Gastroenterology) BJacelyn Pi MD as Consulting Physician (Endocrinology) RGus Height MD (Inactive) as Consulting Physician (Obstetrics and Gynecology) LRolm Bookbinder MD as Consulting Physician (Dermatology)   Medical History:  Past Medical History:  Diagnosis Date   Automatic implantable cardiac defibrillator in situ    [facility]  CHF (congestive heart failure) ([place]    Diabetes mellitus    Hyperlipidemia    Hypertension 06/25/2011   Renal dopplers -  superior mesenteric artery >50% diameter reduction; R renal artery - normal patency; L proximal renal artery 1-59% reduction (lower end of scale); both kidneys normal in size/symmetry with normal cortex and medulla   Leg pain 12/09/2010   doppler of R femoral artery - no evidence of dissection, AV fistula, pseudoaneurysm or other vascular abnormalities   Migraines    Obesity    PONV (postoperative nausea and vomiting)    Sleep apnea    on CPAP - AHI during sleep study was 44   Type II or unspecified type diabetes mellitus without mention of complication, not stated as uncontrolled    Type II or unspecified type diabetes mellitus without mention of complication, not stated as uncontrolled    Unspecified sleep apnea     Allergies  Allergen Reactions   Flagyl [Metronidazole] Anaphylaxis   Penicillins Swelling, Rash and Shortness Of Breath    Has patient had a PCN reaction causing immediate rash, facial/tongue/throat swelling, SOB or lightheadedness with hypotension: No Has patient had a PCN reaction causing severe rash involving mucus membranes or skin necrosis: No Has patient had a PCN reaction that required hospitalization No Has patient had a PCN reaction occurring within the last 10 years: Yes 3-4 years ago If all of the above answers are "NO", then may proceed with Cephalosporin use.   Niacin Other (See Comments)   Miconazole Nitrate Other (See Comments)   Niacin And Related Other (See Comments) and Cough     flushing   Yeast-Related Products Swelling and Rash     SURGICAL HISTORY She  has a past surgical history that includes Abdominal hysterectomy; Neck surgery; Pacemaker insertion; Cardiac defibrillator placement; Knee surgery (Right); doppler echocardiography (06/25/2011); Cardiovascular stress test (08/28/2010); Cardiac catheterization (07/15/2006); Biv pacemaker generator change out (N/A, 01/16/2014); Extracorporeal shock wave lithotripsy (Left, 06/09/2020); and Breast biopsy (Left, 2019). FAMILY HISTORY Her family history includes Cancer - Cervical in her mother; Colon cancer in her maternal uncle; Colon cancer (age of onset: 82) in her maternal grandfather; Colon polyps in her mother; Hearing loss in her father; Hypertension in her father; Valvular heart disease in her father. SOCIAL HISTORY She  reports that she quit smoking about 47 years ago. Her smoking use included cigarettes. She has never used smokeless tobacco. She reports that she does not currently use alcohol. She reports that she does not use drugs. Has not had alcohol in 8 months.   Review of Systems  Constitutional:  Negative for malaise/fatigue and weight loss.  HENT:  Negative for hearing loss and tinnitus.   Eyes:  Negative for blurred vision and double vision.  Respiratory:  Negative for cough, sputum production, shortness of breath and wheezing.   Cardiovascular:  Negative for chest pain, palpitations, orthopnea, claudication, leg swelling and PND.  Gastrointestinal:  Negative for abdominal pain, blood in stool, constipation, diarrhea, heartburn, melena, nausea and vomiting.  Genitourinary: Negative.   Musculoskeletal:  Negative for falls, joint pain and myalgias.  Skin:  Negative for rash.  Neurological:  Negative for dizziness, tingling, sensory change, weakness and headaches.  Endo/Heme/Allergies:  Negative for polydipsia.  Psychiatric/Behavioral: Negative.  Negative for depression, memory loss, substance abuse  and suicidal ideas. The patient is not nervous/anxious and does not have insomnia.   All other systems reviewed and are negative.   Physical Exam: Estimated body mass index is 30.35 kg/m as calculated from the following:   Height as of this encounter: 5' 5.25" (1.657 m).   Weight as of this encounter: 183 lb  12.8 oz (83.4 kg). BP 132/82   Pulse 96   Temp (!) 97.3 F (36.3 C)   Ht 5' 5.25" (1.657 m)   Wt 183 lb 12.8 oz (83.4 kg)   SpO2 98%   BMI 30.35 kg/m  General Appearance: Well nourished, in no apparent distress. Eyes: PERRLA, EOMs, conjunctiva no swelling or erythema Sinuses: No Frontal/maxillary tenderness ENT/Mouth: Ext aud canals clear, normal light reflex with TMs without erythema, bulging.  Good dentition. No erythema, swelling, or exudate on post pharynx. Tonsils not swollen or erythematous. Hearing normal.  Neck: Supple, thyroid normal. No bruits Respiratory: Respiratory effort normal, BS equal bilaterally without rales, rhonchi, wheezing or stridor. Cardio: RRR without murmurs, rubs or gallops. Brisk peripheral pulses without edema, with bilateral hard palpable cords without erythema, warmth, tenderness.  Chest: symmetric, with normal excursions and percussion. Breasts: defer to GYN Abdomen: soft, nontender without organomegaly or mass  Lymphatics: Non tender without lymphadenopathy.  Genitourinary: defer to GYN Musculoskeletal: Full ROM all peripheral extremities,5/5 strength, and normal gait. Skin: Warm, dry without rashes, ecchymosis. She has extensive cherry angiomas to chest and abdomen. Neuro: Cranial nerves intact, reflexes equal bilaterally. Normal muscle tone, no cerebellar symptoms. Sensation intact bil feet to monofilament Psych: Awake and oriented X 3, normal affect, Insight and Judgment appropriate.   EKG- Gets by cardiology annually; defer  Darrol Jump, NP 2:00 PM [facility]

## 2022-04-21 NOTE — Patient Instructions (Signed)
Antinuclear Antibody Test Why am I having this test? This is a test that is used to help diagnose systemic lupus erythematosus (SLE) and other autoimmune diseases. An autoimmune disease is a disease in which the body's own defense system (immune system) attacks its organs. What is being tested? This test checks for antinuclear antibodies (ANA) in the blood. The presence of ANA is associated with several autoimmune diseases. It is seen in almost all people with lupus. What kind of sample is taken?  A blood sample is required for this test. It is usually collected by inserting a needle into a blood vessel. How are the results reported? Your test results will be reported as either positive or negative. What do the results mean? A positive test result may mean that you have: Lupus. Other autoimmune diseases, such as rheumatoid arthritis, scleroderma, or Sjgren syndrome. Talk with your health care provider about what your results mean. In some cases, your health care provider may do more testing to confirm the results. More testing may be done because other conditions can sometimes cause a positive result, such as: Liver dysfunction. Myasthenia gravis. Infectious mononucleosis. Questions to ask your health care provider Ask your health care provider, or the department that is doing the test: When will my results be ready? How will I get my results? What are my treatment options? What other tests do I need? What are my next steps? Summary This is a test that is used to help diagnose systemic lupus erythematosus (SLE) and other autoimmune diseases. An autoimmune disease is a disease in which the body's own defense system (immune system) attacks the body. This test checks for antinuclear antibodies (ANA) in the blood. The presence of ANA is associated with several autoimmune diseases. It is seen in almost all people with lupus. Your test results will be reported as either positive or negative.  Talk with your health care provider about what your results mean. This information is not intended to replace advice given to you by your health care provider. Make sure you discuss any questions you have with your health care provider. Document Revised: 10/26/2020 Document Reviewed: 10/26/2020 Elsevier Patient Education  [place].

## 2022-04-22 ENCOUNTER — Other Ambulatory Visit: Payer: Self-pay | Admitting: Nurse Practitioner

## 2022-04-22 ENCOUNTER — Telehealth: Payer: Self-pay

## 2022-04-22 DIAGNOSIS — E1122 Type 2 diabetes mellitus with diabetic chronic kidney disease: Secondary | ICD-10-CM

## 2022-04-22 DIAGNOSIS — E669 Obesity, unspecified: Secondary | ICD-10-CM

## 2022-04-22 DIAGNOSIS — N182 Chronic kidney disease, stage 2 (mild): Secondary | ICD-10-CM

## 2022-04-22 DIAGNOSIS — E1169 Type 2 diabetes mellitus with other specified complication: Secondary | ICD-10-CM

## 2022-04-22 MED ORDER — TIRZEPATIDE 2.5 MG/0.5ML ~~LOC~~ SOAJ: 2.5000 mg | SUBCUTANEOUS | 2 refills | Status: DC

## 2022-04-22 NOTE — Telephone Encounter (Signed)
Prior Authorization for [facility] completed and submitted through CoverMyMeds.

## 2022-04-23 LAB — CBC WITH DIFFERENTIAL/PLATELET
Absolute Monocytes: 481 cells/uL (ref 200–950)
Basophils Absolute: 58 cells/uL (ref 0–200)
Basophils Relative: 0.7 %
Eosinophils Absolute: 174 cells/uL (ref 15–500)
Eosinophils Relative: 2.1 %
HCT: 42.3 % (ref 35.0–45.0)
Hemoglobin: 14.1 g/dL (ref 11.7–15.5)
Lymphs Abs: 3005 cells/uL (ref 850–3900)
MCH: 29 pg (ref 27.0–33.0)
MCHC: 33.3 g/dL (ref 32.0–36.0)
MCV: 86.9 fL (ref 80.0–100.0)
MPV: 10.1 fL (ref 7.5–12.5)
Monocytes Relative: 5.8 %
Neutro Abs: 4582 cells/uL (ref 1500–7800)
Neutrophils Relative %: 55.2 %
Platelets: 315 10*3/uL (ref 140–400)
RBC: 4.87 10*6/uL (ref 3.80–5.10)
RDW: 13.1 % (ref 11.0–15.0)
Total Lymphocyte: 36.2 %
WBC: 8.3 10*3/uL (ref 3.8–10.8)

## 2022-04-23 LAB — HEMOGLOBIN A1C
Hgb A1c MFr Bld: 12.1 % of total Hgb — ABNORMAL HIGH (ref <5.7)
Mean Plasma Glucose: 301 mg/dL
eAG (mmol/L): 16.6 mmol/L

## 2022-04-23 LAB — URINALYSIS, ROUTINE W REFLEX MICROSCOPIC
Bilirubin Urine: NEGATIVE
Hgb urine dipstick: NEGATIVE
Ketones, ur: NEGATIVE
Nitrite: NEGATIVE
Protein, ur: NEGATIVE
RBC / HPF: NONE SEEN /HPF (ref 0–2)
Specific Gravity, Urine: 1.023 (ref 1.001–1.035)
pH: 7 (ref 5.0–8.0)

## 2022-04-23 LAB — LIPID PANEL
Cholesterol: 102 mg/dL (ref <200)
HDL: 36 mg/dL — ABNORMAL LOW (ref >=50)
LDL Cholesterol (Calc): 42 mg/dL (calc)
Non-HDL Cholesterol (Calc): 66 mg/dL (calc) (ref <130)
Total CHOL/HDL Ratio: 2.8 (calc) (ref <5.0)
Triglycerides: 166 mg/dL — ABNORMAL HIGH (ref <150)

## 2022-04-23 LAB — MICROSCOPIC MESSAGE

## 2022-04-23 LAB — COMPLETE METABOLIC PANEL WITH GFR
AG Ratio: 1.5 (calc) (ref 1.0–2.5)
ALT: 15 U/L (ref 6–29)
AST: 17 U/L (ref 10–35)
Albumin: 4.5 g/dL (ref 3.6–5.1)
Alkaline phosphatase (APISO): 74 U/L (ref 37–153)
BUN: 14 mg/dL (ref 7–25)
CO2: 26 mmol/L (ref 20–32)
Calcium: 10.3 mg/dL (ref 8.6–10.4)
Chloride: 97 mmol/L — ABNORMAL LOW (ref 98–110)
Creat: 0.86 mg/dL (ref 0.50–1.05)
Globulin: 3 g/dL (calc) (ref 1.9–3.7)
Glucose, Bld: 371 mg/dL — ABNORMAL HIGH (ref 65–99)
Potassium: 4.5 mmol/L (ref 3.5–5.3)
Sodium: 135 mmol/L (ref 135–146)
Total Bilirubin: 0.5 mg/dL (ref 0.2–1.2)
Total Protein: 7.5 g/dL (ref 6.1–8.1)
eGFR: 75 mL/min/{1.73_m2} (ref >=60)

## 2022-04-23 LAB — ANA: Anti Nuclear Antibody (ANA): NEGATIVE

## 2022-04-23 LAB — TSH: TSH: 1.41 mIU/L (ref 0.40–4.50)

## 2022-04-23 LAB — MICROALBUMIN / CREATININE URINE RATIO
Creatinine, Urine: 40 mg/dL (ref 20–275)
Microalb Creat Ratio: 18 mcg/mg creat (ref <30)
Microalb, Ur: 0.7 mg/dL

## 2022-04-23 LAB — RHEUMATOID FACTOR: Rheumatoid fact SerPl-aCnc: <14 IU/mL (ref <14)

## 2022-04-23 LAB — MAGNESIUM: Magnesium: 1.5 mg/dL (ref 1.5–2.5)

## 2022-04-23 LAB — VITAMIN B12: Vitamin B-12: 492 pg/mL (ref 200–1100)

## 2022-04-23 LAB — INSULIN, RANDOM: Insulin: 31.4 u[IU]/mL — ABNORMAL HIGH

## 2022-04-23 LAB — VITAMIN D 25 HYDROXY (VIT D DEFICIENCY, FRACTURES): Vit D, 25-Hydroxy: 74 ng/mL (ref 30–100)

## 2022-04-25 LAB — ANCA SCREEN W REFLEX TITER: ANCA SCREEN: NEGATIVE

## 2022-04-25 LAB — ANTI-SMITH ANTIBODY: ENA SM Ab Ser-aCnc: <1 AI

## 2022-04-25 LAB — SJOGRENS SYNDROME-A EXTRACTABLE NUCLEAR ANTIBODY: SSA (Ro) (ENA) Antibody, IgG: <1 AI

## 2022-04-26 ENCOUNTER — Other Ambulatory Visit: Payer: Self-pay

## 2022-04-26 ENCOUNTER — Encounter: Payer: Self-pay | Admitting: Nurse Practitioner

## 2022-04-26 MED ORDER — TIRZEPATIDE 2.5 MG/0.5ML ~~LOC~~ SOAJ: 2.5000 mg | SUBCUTANEOUS | 2 refills | Status: DC

## 2022-04-26 MED ORDER — FENOFIBRATE 134 MG PO CAPS: 134.0000 mg | ORAL_CAPSULE | Freq: Every day | ORAL | 11 refills | Status: DC

## 2022-04-26 NOTE — Telephone Encounter (Signed)
PA approved.

## 2022-04-27 ENCOUNTER — Encounter: Payer: Self-pay | Admitting: Cardiovascular Disease

## 2022-04-27 MED ORDER — ROSUVASTATIN CALCIUM 40 MG PO TABS: 40.0000 mg | ORAL_TABLET | Freq: Every day | ORAL | 3 refills | Status: DC

## 2022-04-27 NOTE — Progress Notes (Signed)
Remote pacemaker transmission.   

## 2022-04-29 ENCOUNTER — Ambulatory Visit (HOSPITAL_COMMUNITY): Payer: BC Managed Care – PPO | Attending: Cardiology

## 2022-04-29 DIAGNOSIS — I3139 Other pericardial effusion (noninflammatory): Secondary | ICD-10-CM | POA: Diagnosis not present

## 2022-04-29 LAB — ECHOCARDIOGRAM LIMITED
Area-P 1/2: 7.02 cm2
S' Lateral: 3.6 cm

## 2022-04-30 ENCOUNTER — Encounter: Payer: Self-pay | Admitting: Nurse Practitioner

## 2022-04-30 DIAGNOSIS — E1122 Type 2 diabetes mellitus with diabetic chronic kidney disease: Secondary | ICD-10-CM

## 2022-05-03 MED ORDER — INSULIN ASPART PROT & ASPART (70-30 MIX) 100 UNIT/ML PEN: 10.0000 [IU] | PEN_INJECTOR | Freq: Two times a day (BID) | SUBCUTANEOUS | 2 refills | Status: DC

## 2022-05-04 ENCOUNTER — Telehealth: Payer: Self-pay | Admitting: *Deleted

## 2022-05-04 DIAGNOSIS — I428 Other cardiomyopathies: Secondary | ICD-10-CM

## 2022-05-04 MED ORDER — SACUBITRIL-VALSARTAN 24-26 MG PO TABS: 1.0000 | ORAL_TABLET | Freq: Two times a day (BID) | ORAL | 6 refills | Status: DC

## 2022-05-04 NOTE — Telephone Encounter (Signed)
-----   Message from Margie Billet, [place] sent at 04/29/2022  2:46 PM EST ----- Please call patient and tell her that her echocardiogram continues to show a moderate pericardial effusion. This has been stable since 09/04/21. As long as she is not having symptoms (shortness of breath, low BP), we can continue to follow.   Ejection fraction is slightly decreased to 45-50% (was previously 50-55%). She should make sure she is taking her carvedilol, losartan, spironolactone. She should start jardiance 10 mg daily.   Needs BMP in 4 weeks.

## 2022-05-04 NOTE — Telephone Encounter (Signed)
Spoke with pt, aware of results. She has an allergy to yeast so when I discussed the potential side effects of jardiance she was concerned. She also reports she is getting ready to start insulin so not sure how that and the jardiance will work for the sugar. Aware will make Nunzio Cory aware and get her recommendations.

## 2022-05-04 NOTE — Telephone Encounter (Signed)
Spoke with pt, aware of medication change. Samples of entresto and savings card placed at the front desk for pick up. Lab orders in with samples and Follow up scheduled

## 2022-05-04 NOTE — Telephone Encounter (Signed)
Left message for pt to call.

## 2022-05-07 ENCOUNTER — Other Ambulatory Visit: Payer: Self-pay | Admitting: Nurse Practitioner

## 2022-05-07 DIAGNOSIS — E1122 Type 2 diabetes mellitus with diabetic chronic kidney disease: Secondary | ICD-10-CM

## 2022-05-07 MED ORDER — INSULIN LISPRO 100 UNIT/ML IJ SOLN: 2.0000 [IU] | Freq: Every day | INTRAMUSCULAR | 3 refills | Status: DC

## 2022-05-07 MED ORDER — INSULIN LISPRO 100 UNIT/ML IJ SOLN: 2.0000 [IU] | Freq: Once | INTRAMUSCULAR | 3 refills | Status: DC

## 2022-05-12 ENCOUNTER — Encounter: Payer: Self-pay | Admitting: Nurse Practitioner

## 2022-05-16 ENCOUNTER — Other Ambulatory Visit: Payer: Self-pay | Admitting: Nurse Practitioner

## 2022-05-16 DIAGNOSIS — F411 Generalized anxiety disorder: Secondary | ICD-10-CM

## 2022-05-25 DIAGNOSIS — I428 Other cardiomyopathies: Secondary | ICD-10-CM | POA: Diagnosis not present

## 2022-05-26 LAB — BASIC METABOLIC PANEL
BUN/Creatinine Ratio: 13 (ref 12–28)
BUN: 12 mg/dL (ref 8–27)
CO2: 23 mmol/L (ref 20–29)
Calcium: 9.6 mg/dL (ref 8.7–10.3)
Chloride: 100 mmol/L (ref 96–106)
Creatinine, Ser: 0.96 mg/dL (ref 0.57–1.00)
Glucose: 249 mg/dL — ABNORMAL HIGH (ref 70–99)
Potassium: 4.2 mmol/L (ref 3.5–5.2)
Sodium: 142 mmol/L (ref 134–144)
eGFR: 65 mL/min/{1.73_m2} (ref >59)

## 2022-05-26 MED ORDER — RYBELSUS 3 MG PO TABS: 3.0000 mg | ORAL_TABLET | Freq: Every day | ORAL | 0 refills | Status: DC

## 2022-05-31 ENCOUNTER — Encounter: Payer: Self-pay | Admitting: Nurse Practitioner

## 2022-05-31 ENCOUNTER — Ambulatory Visit: Payer: PPO | Attending: Nurse Practitioner | Admitting: Nurse Practitioner

## 2022-05-31 VITALS — BP 103/63 | HR 82 | Ht 65.0 in | Wt 182.0 lb

## 2022-05-31 DIAGNOSIS — I10 Essential (primary) hypertension: Secondary | ICD-10-CM

## 2022-05-31 DIAGNOSIS — I2510 Atherosclerotic heart disease of native coronary artery without angina pectoris: Secondary | ICD-10-CM

## 2022-05-31 DIAGNOSIS — Z950 Presence of cardiac pacemaker: Secondary | ICD-10-CM

## 2022-05-31 DIAGNOSIS — I3139 Other pericardial effusion (noninflammatory): Secondary | ICD-10-CM | POA: Diagnosis not present

## 2022-05-31 DIAGNOSIS — E785 Hyperlipidemia, unspecified: Secondary | ICD-10-CM

## 2022-05-31 DIAGNOSIS — I428 Other cardiomyopathies: Secondary | ICD-10-CM

## 2022-05-31 NOTE — Progress Notes (Signed)
Office Visit    Patient Name: [name] Date of Encounter: 05/31/2022  Primary Care Provider:  Unk Pinto, MD Primary Cardiologist:  Shelva Majestic, MD  Chief Complaint   67 year old female with a history of mild nonobstructive CAD per cath in 2018, NICM with subsequent normalization of LVEF, LBBB, s/p BiV ICD with downgrade to BiV PPM at time of GEN change in 2015 due to normalization of LV function, hypertension, hyperlipidemia, type 2 diabetes, and OSA who presents for follow-up related to NICM.   Past Medical History    Past Medical History:  Diagnosis Date   Automatic implantable cardiac defibrillator in situ    [facility] [place]   CHF (congestive heart failure) (HCC)    Diabetes mellitus    Hyperlipidemia    Hypertension 06/25/2011   Renal dopplers - superior mesenteric artery >50% diameter reduction; R renal artery - normal patency; L proximal renal artery 1-59% reduction (lower end of scale); both kidneys normal in size/symmetry with normal cortex and medulla   Leg pain 12/09/2010   doppler of R femoral artery - no evidence of dissection, AV fistula, pseudoaneurysm or other vascular abnormalities   Migraines    Obesity    PONV (postoperative nausea and vomiting)    Sleep apnea    on CPAP - AHI during sleep study was 44   Type II or unspecified type diabetes mellitus without mention of complication, not stated as uncontrolled    Type II or unspecified type diabetes mellitus without mention of complication, not stated as uncontrolled    Unspecified sleep apnea    Past Surgical History:  Procedure Laterality Date   ABDOMINAL HYSTERECTOMY     total -    BIV PACEMAKER GENERATOR CHANGE OUT N/A 01/16/2014   Procedure: BIV PACEMAKER GENERATOR CHANGE OUT;  Surgeon: Evans Lance, MD;  Location: [facility] CATH LAB;  Service: Cardiovascular;  Laterality: N/A;   BREAST BIOPSY Left 2019   x2   CARDIAC CATHETERIZATION  07/15/2006   no significant CAD by cardiac cath,  confirmed by intravascular [place] of LAD; non-ischemic cardiomyopathy prob related to uncontrolled hypertension, DM and morbid obesity; moderate pulmonary hypertension; preserved cardiac output and cardiac index   CARDIAC DEFIBRILLATOR PLACEMENT     ICD by Dr. Lovena Le   CARDIOVASCULAR STRESS TEST  08/28/2010   R/P MV - pattern of normal perfusion in all regions, no scintigraphic evidence of inducible myocardial ischemia; no EKG changes; normal perfusion study; pt did experience chesst pain during strudy, resolved spontaneously   DOPPLER ECHOCARDIOGRAPHY  06/25/2011   EF >55%; moderate concentric LV hypertrophy; LA mildly dilated, mild mitral annular calcification;    EXTRACORPOREAL SHOCK WAVE LITHOTRIPSY Left 06/09/2020   Procedure: EXTRACORPOREAL SHOCK WAVE LITHOTRIPSY (ESWL);  Surgeon: Janith Lima, MD;  Location[facility];  Service: Urology;  Laterality: Left;  90 MINS   KNEE SURGERY Right    arthroscopic   NECK SURGERY     PACEMAKER INSERTION      Allergies  Allergies  Allergen Reactions   Flagyl [Metronidazole] Anaphylaxis   Penicillins Swelling, Rash and Shortness Of Breath    Has patient had a PCN reaction causing immediate rash, facial/tongue/throat swelling, SOB or lightheadedness with hypotension: No Has patient had a PCN reaction causing severe rash involving mucus membranes or skin necrosis: No Has patient had a PCN reaction that required hospitalization No Has patient had a PCN reaction occurring within the last 10 years: Yes 3-4 years ago If all of  the above answers are "NO", then may proceed with Cephalosporin use.   Niacin Other (See Comments)   Miconazole Nitrate Other (See Comments)   Niacin And Related Other (See Comments) and Cough    flushing   Yeast-Related Products Swelling and Rash     Labs/Other Studies Reviewed    The following studies were reviewed today:  Echocardiogram 09/04/21 1. Left ventricular ejection fraction, by estimation, is  50 to 55%. The  left ventricle has low normal function. The left ventricle has no regional  wall motion abnormalities. There is mild concentric left ventricular  hypertrophy. Left ventricular  diastolic parameters are consistent with Grade I diastolic dysfunction  (impaired relaxation).   2. Right ventricular systolic function is mildly reduced. The right  ventricular size is normal. Tricuspid regurgitation signal is inadequate  for assessing PA pressure.   3. Moderate pericardial effusion. The pericardial effusion is  circumferential.   4. The mitral valve is normal in structure. Trivial mitral valve  regurgitation. No evidence of mitral stenosis.   5. The aortic valve is tricuspid. Aortic valve regurgitation is not  visualized. Aortic valve sclerosis/calcification is present, without any  evidence of aortic stenosis.   6. Aortic dilatation noted. There is mild dilatation of the ascending  aorta, measuring 38 mm.   7. The inferior vena cava is normal in size with greater than 50%  respiratory variability, suggesting right atrial pressure of 3 mmHg.    Echo limited 04/2022: IMPRESSIONS     1. Moderate pericardial effusion seen best on parasternal images; not  significantly changed compared to 09/04/21.   2. Left ventricular ejection fraction, by estimation, is 45 to 50%. The  left ventricle has mildly decreased function. The left ventricle  demonstrates global hypokinesis. Left ventricular diastolic parameters  were normal.   3. Right ventricular systolic function is normal. The right ventricular  size is normal.   4. Moderate pericardial effusion.   5. Trivial mitral valve regurgitation.   6. The aortic valve is tricuspid. Aortic valve regurgitation is trivial.  Aortic valve sclerosis is present, with no evidence of aortic valve  stenosis.   Recent Labs: 04/21/2022: ALT 15; Hemoglobin 14.1; Magnesium 1.5; Platelets 315; TSH 1.41 05/25/2022: BUN 12; Creatinine, Ser 0.96;  Potassium 4.2; Sodium 142  Recent Lipid Panel    Component Value Date/Time   CHOL 102 04/21/2022 1457   TRIG 166 (H) 04/21/2022 1457   HDL 36 (L) 04/21/2022 1457   CHOLHDL 2.8 04/21/2022 1457   VLDL 27 11/19/2015 0954   LDLCALC 42 04/21/2022 1457    History of Present Illness    67 year old female with the above past medial history including mild nonobstructive CAD per cath in 2018, NICM with subsequent normalization of LVEF, LBBB, s/p BiV ICD with downgrade to BiV PPM at time of GEN change in 2015 due to normalization of LV function, hypertension, hyperlipidemia, type 2 diabetes, and OSA.  She has a long history of nonischemic cardiomyopathy and LBBB, initially diagnosed in 2008 when she was found to have EF 25 to 30%.  Cardiac catheterization at that time showed mild nonobstructive CAD with 30 to 40% stenosis of the mid LAD and mild luminal irregularities in the RCA and left circumflex but no obstructive disease.  She underwent placement of Saint Jude BiV ICD in 12/1998.  LVEF subsequently normalized at the time of generator change in 01/2014 device was downgraded to BiV PPM.  Most recent echocardiogram in 08/2021 showed EF 50 to 55%, no RWMA, mild  concentric LVH, G1 DD, mildly reduced RV systolic function, moderate pericardial effusion with no signs of cardiac tamponade.  She was last seen in the office on 04/05/2022 and was stable from a cardiac standpoint.  Repeat limited echo in 04/2022 showed EF 45 to 50%, persistent moderate pericardial effusion, unchanged compared to 08/2021.  Jardiance was recommended, but not started due to pt's concerns for side effects.  She was started on Entresto 24-26 mg daily.  She presents today for follow-up. Since her last visit she done well from a cardiac standpoint.  She denies any symptoms concerning for angina, denies dyspnea, edema, PND, orthopnea, weight gain.  She plans to retire within the next month and plans to travel to [place] in June.  She is hoping to  discuss possible timing of her generator change for her device with Dr. Lovena Le at her follow-up appointment in April 2024.  Overall, she reports feeling well.   Home Medications    Current Outpatient Medications  Medication Sig Dispense Refill   aspirin 81 MG tablet Take 81 mg by mouth at bedtime.     aspirin-acetaminophen-caffeine (EXCEDRIN MIGRAINE) 250-250-65 MG per tablet Take 2 tablets by mouth daily as needed for migraine.     carvedilol (COREG) 12.5 MG tablet TAKE 1 TABLET(12.5 MG) BY MOUTH TWICE DAILY 180 tablet 1   diazepam (VALIUM) 5 MG tablet Take 1/2 to 1 tablet at Bedtime ONLY if needed 30 tablet 0   estradiol (ESTRACE) 1 MG tablet Take 1.5 tablets by mouth daily.  4   fenofibrate micronized (LOFIBRA) 134 MG capsule Take 1 capsule (134 mg total) by mouth daily before breakfast. 30 capsule 11   imipramine (TOFRANIL) 25 MG tablet TAKE 1 TO 2 TABLETS BY MOUTH AT BEDTIME AS NEEDED FOR SLEEP 180 tablet 0   insulin lispro (HUMALOG) 100 UNIT/ML injection Inject 0.02-0.04 mLs (2-4 Units total) into the skin daily. Administer 15 minutes before or immediately after the largest meal or meal with the greatest postprandial glucose excursion. 10 mL 3   loratadine (CLARITIN) 10 MG tablet Take 10 mg by mouth daily.     Magnesium 400 MG TABS Take by mouth daily.     metFORMIN (GLUCOPHAGE XR) 500 MG 24 hr tablet Take 4 tabs daily as tolerated, spread over meals. 360 tablet 3   rizatriptan (MAXALT) 10 MG tablet TAKE 1 TABLET BY MOUTH FOR SEVERE MIGRAINE AND MAY REPEAT IN 2 HOURS IF NEEDED(MAX 2 TABLETS PER 24 HOURS) 10 tablet 0   rosuvastatin (CRESTOR) 40 MG tablet Take 1 tablet (40 mg total) by mouth daily. 90 tablet 3   sacubitril-valsartan (ENTRESTO) 24-26 MG Take 1 tablet by mouth 2 (two) times daily. 60 tablet 6   Semaglutide (RYBELSUS) 3 MG TABS Take 1 tablet (3 mg total) by mouth daily. 30 tablet 0   spironolactone (ALDACTONE) 25 MG tablet TAKE 1 TABLET(25 MG) BY MOUTH TWICE DAILY 180 tablet 2    terbinafine (LAMISIL) 250 MG tablet Take 1 tab daily for 1 month, skip for 1 month and repeat for total of 3 cycles. 90 tablet 0   valACYclovir (VALTREX) 500 MG tablet TAKE 1 TABLET(500 MG) BY MOUTH DAILY 90 tablet 0   verapamil (CALAN-SR) 240 MG CR tablet TAKE 1 TABLET(240 MG) BY MOUTH AT BEDTIME 90 tablet 1   VITAMIN D PO Take 2,000 Units by mouth.     No current facility-administered medications for this visit.     Review of Systems    She denies chest pain,  palpitations, dyspnea, pnd, orthopnea, n, v, dizziness, syncope, edema, weight gain, or early satiety. All other systems reviewed and are otherwise negative except as noted above.   Physical Exam    VS:  BP 103/63 (BP Location: Left Arm, Patient Position: Sitting, Cuff Size: Normal)   Pulse 82   Ht 5\' 5"  (1.651 m)   Wt 182 lb (82.6 kg)   BMI 30.29 kg/m   GEN: Well nourished, well developed, in no acute distress. HEENT: normal. Neck: Supple, no JVD, carotid bruits, or masses. Cardiac: RRR, no murmurs, rubs, or gallops. No clubbing, cyanosis, edema.  Radials/DP/PT 2+ and equal bilaterally.  Respiratory:  Respirations regular and unlabored, clear to auscultation bilaterally. GI: Soft, nontender, nondistended, BS + x 4. MS: no deformity or atrophy. Skin: warm and dry, no rash. Neuro:  Strength and sensation are intact. Psych: Normal affect.  Accessory Clinical Findings    ECG personally reviewed by me today -no EKG in office today.    Lab Results  Component Value Date   WBC 8.3 04/21/2022   HGB 14.1 04/21/2022   HCT 42.3 04/21/2022   MCV 86.9 04/21/2022   PLT 315 04/21/2022   Lab Results  Component Value Date   CREATININE 0.96 05/25/2022   BUN 12 05/25/2022   NA 142 05/25/2022   K 4.2 05/25/2022   CL 100 05/25/2022   CO2 23 05/25/2022   Lab Results  Component Value Date   ALT 15 04/21/2022   AST 17 04/21/2022   ALKPHOS 57 05/13/2016   BILITOT 0.5 04/21/2022   Lab Results  Component Value Date   CHOL  102 04/21/2022   HDL 36 (L) 04/21/2022   LDLCALC 42 04/21/2022   TRIG 166 (H) 04/21/2022   CHOLHDL 2.8 04/21/2022    Lab Results  Component Value Date   HGBA1C 12.1 (H) 04/21/2022    Assessment & Plan    1. NICM: Prior EF as low as 25 to 30%, improved to 60 to 65%.  Cath in 2008 showed mild nonobstructive CAD.  S/p BiV ICD in 2009, device was downgraded to BiV pacemaker in 2015.  Repeat limited echo in 04/2022 showed EF 45 to 50%, persistent moderate pericardial effusion, unchanged compared to 08/2021. Recently started on Entresto.  BP borderline low in office today, but has been stable overall.  She is asymptomatic with this. Euvolemic and well compensated on exam.  Continue carvedilol, Entresto, verapamil, spironolactone.   2. Nonobstructive CAD: Cath in 2008 showed mild nonobstructive CAD with 30 to 40% stenosis of the mid LAD and mild luminal irregularities in the RCA and left circumflex but no obstructive disease.  Stable with no anginal symptoms.  No indication for ischemic evaluation.  Continue current medications as above, continue aspirin, Crestor.  3. S/p Saint Jude BiV PPM: Follows with Dr. Lovena Le.  Most recent device check in 03/2022 was stable.  She has approximately 9 months left on her battery life.  Due for follow-up with EP-has appt scheduled for 06/2022.   4. Pericardial effusion: Stable on most recent echo.  Asymptomatic.  5. Hypertension: BP well controlled. Continue current antihypertensive regimen.   6. Hyperlipidemia: LDL was 42 in 04/2022.  Continue Crestor.  7. Disposition: Follow-up as scheduled with Dr. Lovena Le in 06/2022, follow-up as scheduled with Dr. Claiborne Billings in 09/2022.        Lenna Sciara, NP 05/31/2022, 10:00 AM

## 2022-05-31 NOTE — Patient Instructions (Signed)
Medication Instructions:  Your physician recommends that you continue on your current medications as directed. Please refer to the Current Medication list given to you today.   *If you need a refill on your cardiac medications before your next appointment, please call your pharmacy*   Lab Work: None ordered If you have labs (blood work) drawn today and your tests are completely normal, you will receive your results only by: [place] (if you have MyChart) OR A paper copy in the mail If you have any lab test that is abnormal or we need to change your treatment, we will call you to review the results.   Testing/Procedures: None ordered   Follow-Up: At [facility], you and your health needs are our priority.  As part of our continuing mission to provide you with exceptional heart care, we have created designated Provider Care Teams.  These Care Teams include your primary Cardiologist (physician) and Advanced Practice Providers (APPs -  Physician Assistants and Nurse Practitioners) who all work together to provide you with the care you need, when you need it.  We recommend signing up for the patient portal called "MyChart".  Sign up information is provided on this After Visit Summary.  MyChart is used to connect with patients for Virtual Visits (Telemedicine).  Patients are able to view lab/test results, encounter notes, upcoming appointments, etc.  Non-urgent messages can be sent to your provider as well.   To learn more about what you can do with MyChart, go to [email or web address].    Your next appointment:   Keep follow-up.  Provider:   Shelva Majestic, MD     Other Instructions

## 2022-06-01 ENCOUNTER — Other Ambulatory Visit: Payer: Self-pay | Admitting: Cardiovascular Disease

## 2022-06-02 ENCOUNTER — Encounter: Payer: Self-pay | Admitting: Nurse Practitioner

## 2022-06-02 DIAGNOSIS — E1122 Type 2 diabetes mellitus with diabetic chronic kidney disease: Secondary | ICD-10-CM

## 2022-06-07 ENCOUNTER — Other Ambulatory Visit: Payer: Self-pay

## 2022-06-07 DIAGNOSIS — E1122 Type 2 diabetes mellitus with diabetic chronic kidney disease: Secondary | ICD-10-CM

## 2022-06-07 MED ORDER — FREESTYLE LIBRE 3 SENSOR MISC: 3 refills | Status: DC

## 2022-06-08 MED ORDER — RYBELSUS 3 MG PO TABS: 3.0000 mg | ORAL_TABLET | Freq: Every day | ORAL | 0 refills | Status: DC

## 2022-06-22 ENCOUNTER — Encounter: Payer: Self-pay | Admitting: Internal Medicine

## 2022-06-22 ENCOUNTER — Ambulatory Visit: Payer: PPO | Attending: Internal Medicine | Admitting: Internal Medicine

## 2022-06-22 VITALS — BP 126/68 | HR 86 | Ht 67.0 in | Wt 184.0 lb

## 2022-06-22 DIAGNOSIS — I428 Other cardiomyopathies: Secondary | ICD-10-CM

## 2022-06-22 DIAGNOSIS — Z950 Presence of cardiac pacemaker: Secondary | ICD-10-CM | POA: Diagnosis not present

## 2022-06-22 DIAGNOSIS — I447 Left bundle-branch block, unspecified: Secondary | ICD-10-CM

## 2022-06-22 NOTE — Patient Instructions (Signed)

## 2022-06-22 NOTE — Progress Notes (Signed)
HPI  [name] returns today for followup. She is a very pleasant 67 year old woman with a nonischemic cardiomyopathy, chronic systolic heart failure, and left bundle branch block, who underwent biventricular ICD implantation and had normalization of her left ventricular function, s/p downgrade to a BiV PPM. In the interim she has been well with no chest pain or sob.  her weight is essentially unchanged from 3 years ago. She is planning a trip to [place] after she retires in a few months. She notes that he EF is down a bit, on 2D echo to 45%. She has not had any worsening symptoms.     Current Outpatient Medications  Medication Sig Dispense Refill   aspirin 81 MG tablet Take 81 mg by mouth at bedtime.     aspirin-acetaminophen-caffeine (EXCEDRIN MIGRAINE) 250-250-65 MG per tablet Take 2 tablets by mouth daily as needed for migraine.     carvedilol (COREG) 12.5 MG tablet TAKE 1 TABLET(12.5 MG) BY MOUTH TWICE DAILY 180 tablet 1   Continuous Blood Gluc Sensor (FREESTYLE LIBRE 3 SENSOR) MISC Place 1 sensor on the skin every 14 days. Use to check glucose continuously 2 each 3   diazepam (VALIUM) 5 MG tablet Take 1/2 to 1 tablet at Bedtime ONLY if needed 30 tablet 0   estradiol (ESTRACE) 1 MG tablet Take 1.5 tablets by mouth daily.  4   fenofibrate micronized (LOFIBRA) 134 MG capsule Take 1 capsule (134 mg total) by mouth daily before breakfast. 30 capsule 11   imipramine (TOFRANIL) 25 MG tablet TAKE 1 TO 2 TABLETS BY MOUTH AT BEDTIME AS NEEDED FOR SLEEP 180 tablet 0   insulin lispro (HUMALOG) 100 UNIT/ML injection Inject 0.02-0.04 mLs (2-4 Units total) into the skin daily. Administer 15 minutes before or immediately after the largest meal or meal with the greatest postprandial glucose excursion. 10 mL 3   loratadine (CLARITIN) 10 MG tablet Take 10 mg by mouth daily.     Magnesium 400 MG TABS Take by mouth daily.     metFORMIN (GLUCOPHAGE XR) 500 MG 24 hr tablet Take 4 tabs daily as  tolerated, spread over meals. 360 tablet 3   rizatriptan (MAXALT) 10 MG tablet TAKE 1 TABLET BY MOUTH FOR SEVERE MIGRAINE AND MAY REPEAT IN 2 HOURS IF NEEDED(MAX 2 TABLETS PER 24 HOURS) 10 tablet 0   rosuvastatin (CRESTOR) 40 MG tablet Take 1 tablet (40 mg total) by mouth daily. 90 tablet 3   sacubitril-valsartan (ENTRESTO) 24-26 MG Take 1 tablet by mouth 2 (two) times daily. 60 tablet 6   Semaglutide (RYBELSUS) 3 MG TABS Take 1 tablet (3 mg total) by mouth daily. 30 tablet 0   spironolactone (ALDACTONE) 25 MG tablet TAKE 1 TABLET(25 MG) BY MOUTH TWICE DAILY 180 tablet 3   terbinafine (LAMISIL) 250 MG tablet Take 1 tab daily for 1 month, skip for 1 month and repeat for total of 3 cycles. 90 tablet 0   valACYclovir (VALTREX) 500 MG tablet TAKE 1 TABLET(500 MG) BY MOUTH DAILY 90 tablet 0   verapamil (CALAN-SR) 240 MG CR tablet TAKE 1 TABLET(240 MG) BY MOUTH AT BEDTIME 90 tablet 1   VITAMIN D PO Take 2,000 Units by mouth.     No current facility-administered medications for this visit.     Past Medical History:  Diagnosis Date   Automatic implantable cardiac defibrillator in situ    [employer] MODEL 7122   CHF (congestive heart failure)    Diabetes mellitus  Hyperlipidemia    Hypertension 06/25/2011   Renal dopplers - superior mesenteric artery >50% diameter reduction; R renal artery - normal patency; L proximal renal artery 1-59% reduction (lower end of scale); both kidneys normal in size/symmetry with normal cortex and medulla   Leg pain 12/09/2010   doppler of R femoral artery - no evidence of dissection, AV fistula, pseudoaneurysm or other vascular abnormalities   Migraines    Obesity    PONV (postoperative nausea and vomiting)    Sleep apnea    on CPAP - AHI during sleep study was 44   Type II or unspecified type diabetes mellitus without mention of complication, not stated as uncontrolled    Type II or unspecified type diabetes mellitus without mention of complication, not stated as  uncontrolled    Unspecified sleep apnea     ROS:   All systems reviewed and negative except as noted in the HPI.   Past Surgical History:  Procedure Laterality Date   ABDOMINAL HYSTERECTOMY     total -    BIV PACEMAKER GENERATOR CHANGE OUT N/A 01/16/2014   Procedure: BIV PACEMAKER GENERATOR CHANGE OUT;  Surgeon: Marinus Maw, MD;  Location: [facility] CATH LAB;  Service: Cardiovascular;  Laterality: N/A;   BREAST BIOPSY Left 2019   x2   CARDIAC CATHETERIZATION  07/15/2006   no significant CAD by cardiac cath, confirmed by intravascular [place] of LAD; non-ischemic cardiomyopathy prob related to uncontrolled hypertension, DM and morbid obesity; moderate pulmonary hypertension; preserved cardiac output and cardiac index   CARDIAC DEFIBRILLATOR PLACEMENT     ICD by Dr. Ladona Ridgel   CARDIOVASCULAR STRESS TEST  08/28/2010   R/P MV - pattern of normal perfusion in all regions, no scintigraphic evidence of inducible myocardial ischemia; no EKG changes; normal perfusion study; pt did experience chesst pain during strudy, resolved spontaneously   DOPPLER ECHOCARDIOGRAPHY  06/25/2011   EF >55%; moderate concentric LV hypertrophy; LA mildly dilated, mild mitral annular calcification;    EXTRACORPOREAL SHOCK WAVE LITHOTRIPSY Left 06/09/2020   Procedure: EXTRACORPOREAL SHOCK WAVE LITHOTRIPSY (ESWL);  Surgeon: Jannifer Hick, MD;  Location: [facility];  Service: Urology;  Laterality: Left;  90 MINS   KNEE SURGERY Right    arthroscopic   NECK SURGERY     PACEMAKER INSERTION       Family History  Problem Relation Age of Onset   Cancer - Cervical Mother        precancerous cells   Colon polyps Mother    Valvular heart disease Father        late 37s, died after surgery   Hearing loss Father    Hypertension Father    Colon cancer Maternal Grandfather 5   Colon cancer Maternal Uncle      Social History   Socioeconomic History   Marital status: Married    Spouse name: Not on file    Number of children: Not on file   Years of education: Not on file   Highest education level: Not on file  Occupational History   Not on file  Tobacco Use   Smoking status: Former    Types: Cigarettes    Quit date: 03/09/1975    Years since quitting: 47.3   Smokeless tobacco: Never   Tobacco comments:    light smoker for a few months as a teen   Vaping Use   Vaping Use: Never used  Substance and Sexual Activity   Alcohol use: Not Currently   Drug use:  No   Sexual activity: Yes    Partners: Male    Birth control/protection: Post-menopausal  Other Topics Concern   Not on file  Social History Narrative   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   Social Determinants of Health   Financial Resource Strain: Not on file  Food Insecurity: Not on file  Transportation Needs: Not on file  Physical Activity: Not on file  Stress: Not on file  Social Connections: Not on file  Intimate Partner Violence: Not on file     BP 126/68   Pulse 86   Ht  (1.702 m)   Wt 184 lb (83.5 kg)   SpO2 98%   BMI 28.82 kg/m   Physical Exam:  Well appearing NAD HEENT: Unremarkable Neck:  No JVD, no thyromegally Lymphatics:  No adenopathy Back:  No CVA tenderness Lungs:  Clear HEART:  Regular rate rhythm, no murmurs, no rubs, no clicks Abd:  soft, positive bowel sounds, no organomegally, no rebound, no guarding Ext:  2 plus pulses, no edema, no cyanosis, no clubbing Skin:  No rashes no nodules Neuro:  CN II through XII intact, motor grossly intact  EKG NSr with biv pacing DEVICE  Normal device function.  See PaceArt for details.   Assess/Plan: 1. Chronic systolic heart failure - she has reduced her dose of meds as she has lost weight and her bp has gone down. She will reduce her coreg to 12.5 bid. 2. Obesity - her weight is stable though she has not lost anymore since I saw her last.  3. Palpitations - these are much improved.  4. Biv PPM - her St. Jude Biv PPM is working normally.  We will recheck in several months. She is about 7-8 months from ERI.    Loman Chroman Zakariye Nee,M.D.

## 2022-06-29 ENCOUNTER — Ambulatory Visit (INDEPENDENT_AMBULATORY_CARE_PROVIDER_SITE_OTHER): Payer: PPO

## 2022-06-29 DIAGNOSIS — I428 Other cardiomyopathies: Secondary | ICD-10-CM

## 2022-06-30 LAB — CUP PACEART REMOTE DEVICE CHECK
Battery Remaining Longevity: 7 mo
Battery Remaining Percentage: 7 %
Battery Voltage: 2.81 V
Brady Statistic AP VP Percent: 1 %
Brady Statistic AP VS Percent: 1 %
Brady Statistic AS VP Percent: 99 %
Brady Statistic AS VS Percent: 1 %
Brady Statistic RA Percent Paced: 1 %
Date Time Interrogation Session: 20240423052411
Implantable Lead Connection Status: 753985
Implantable Lead Connection Status: 753985
Implantable Lead Connection Status: 753985
Implantable Lead Implant Date: 20091016
Implantable Lead Implant Date: 20091016
Implantable Lead Implant Date: 20091016
Implantable Lead Location: 753858
Implantable Lead Location: 753859
Implantable Lead Location: 753860
Implantable Lead Model: 4196
Implantable Lead Model: 7122
Implantable Pulse Generator Implant Date: 20151111
Lead Channel Impedance Value: 390 Ohm
Lead Channel Impedance Value: 530 Ohm
Lead Channel Impedance Value: 740 Ohm
Lead Channel Pacing Threshold Amplitude: 0.5 V
Lead Channel Pacing Threshold Amplitude: 0.75 V
Lead Channel Pacing Threshold Amplitude: 1.5 V
Lead Channel Pacing Threshold Pulse Width: 0.3 ms
Lead Channel Pacing Threshold Pulse Width: 0.3 ms
Lead Channel Pacing Threshold Pulse Width: 0.6 ms
Lead Channel Sensing Intrinsic Amplitude: 3.3 mV
Lead Channel Sensing Intrinsic Amplitude: 7 mV
Lead Channel Setting Pacing Amplitude: 2 V
Lead Channel Setting Pacing Amplitude: 2.5 V
Lead Channel Setting Pacing Amplitude: 2.5 V
Lead Channel Setting Pacing Pulse Width: 0.3 ms
Lead Channel Setting Pacing Pulse Width: 0.6 ms
Lead Channel Setting Sensing Sensitivity: 2 mV
Pulse Gen Model: 3222
Pulse Gen Serial Number: 7688750

## 2022-07-22 ENCOUNTER — Encounter: Payer: Self-pay | Admitting: Nurse Practitioner

## 2022-07-22 ENCOUNTER — Ambulatory Visit (INDEPENDENT_AMBULATORY_CARE_PROVIDER_SITE_OTHER): Payer: PPO | Admitting: Nurse Practitioner

## 2022-07-22 VITALS — BP 116/76 | HR 76 | Temp 97.8°F | Ht 67.0 in | Wt 186.8 lb

## 2022-07-22 DIAGNOSIS — I5022 Chronic systolic (congestive) heart failure: Secondary | ICD-10-CM | POA: Diagnosis not present

## 2022-07-22 DIAGNOSIS — E1122 Type 2 diabetes mellitus with diabetic chronic kidney disease: Secondary | ICD-10-CM

## 2022-07-22 DIAGNOSIS — G4733 Obstructive sleep apnea (adult) (pediatric): Secondary | ICD-10-CM | POA: Diagnosis not present

## 2022-07-22 DIAGNOSIS — I5189 Other ill-defined heart diseases: Secondary | ICD-10-CM | POA: Diagnosis not present

## 2022-07-22 DIAGNOSIS — G43809 Other migraine, not intractable, without status migrainosus: Secondary | ICD-10-CM

## 2022-07-22 DIAGNOSIS — Z950 Presence of cardiac pacemaker: Secondary | ICD-10-CM

## 2022-07-22 DIAGNOSIS — I10 Essential (primary) hypertension: Secondary | ICD-10-CM | POA: Diagnosis not present

## 2022-07-22 DIAGNOSIS — Z79899 Other long term (current) drug therapy: Secondary | ICD-10-CM

## 2022-07-22 DIAGNOSIS — I428 Other cardiomyopathies: Secondary | ICD-10-CM

## 2022-07-22 DIAGNOSIS — F411 Generalized anxiety disorder: Secondary | ICD-10-CM | POA: Diagnosis not present

## 2022-07-22 DIAGNOSIS — E1169 Type 2 diabetes mellitus with other specified complication: Secondary | ICD-10-CM | POA: Diagnosis not present

## 2022-07-22 DIAGNOSIS — E663 Overweight: Secondary | ICD-10-CM | POA: Diagnosis not present

## 2022-07-22 DIAGNOSIS — K219 Gastro-esophageal reflux disease without esophagitis: Secondary | ICD-10-CM | POA: Diagnosis not present

## 2022-07-22 DIAGNOSIS — E538 Deficiency of other specified B group vitamins: Secondary | ICD-10-CM

## 2022-07-22 DIAGNOSIS — E559 Vitamin D deficiency, unspecified: Secondary | ICD-10-CM

## 2022-07-22 DIAGNOSIS — N182 Chronic kidney disease, stage 2 (mild): Secondary | ICD-10-CM

## 2022-07-22 DIAGNOSIS — E785 Hyperlipidemia, unspecified: Secondary | ICD-10-CM

## 2022-07-22 NOTE — Progress Notes (Signed)
Follow Up _________________________________  [name] was seen today for a follow up visit.  Below discusses Plan of Care:  Essential hypertension Continue carvedilol, spironolactone, losartan Discussed DASH (Dietary Approaches to Stop Hypertension) DASH diet is lower in sodium than a typical American diet. Cut back on foods that are high in saturated fat, cholesterol, and trans fats. Eat more whole-grain foods, fish, poultry, and nuts Remain active and exercise as tolerated daily.  Monitor BP at home-Call if greater than 130/80.  Check CMP/CBC  Nonischemic cardiomyopathy [facility]) Follows with Cardiology, Dr Tresa Endo Q6 mo Last seen 04/05/22 Limited echo to reassess pericardial effusion from 08/2021. Continue weight loss, monitor weight at home, follow up cardio  Chronic systolic congestive heart failure (HCC) Follows with Cardiology, Dr Tresa Endo Q 6 mo Last seen 05/2022 Continue weight loss, monitor weight at home, follow up cardio Patient euvolemic  OSA  She is no longer on CPAP following weight loss Continue to monitor  CKD 2 associated with T2DM (HCC) Discussed how what you eat and drink can aide in kidney protection. Stay well hydrated. Avoid high salt foods. Avoid NSAIDS. Keep BP and BG well controlled.   Take medications as prescribed. Remain active and exercise as tolerated daily. Maintain weight.  Continue to monitor. 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 Education: Reviewed 'ABCs' of diabetes management  Discussed goals to be met and/or maintained include A1C (<7) Blood pressure (<130/80) Cholesterol (LDL <70) Continue Eye Exam yearly  Continue Dental Exam Q6 mo Discussed dietary recommendations Discussed Physical Activity  recommendations  Hyperlipidemia associated with T2DM (HCC) Continue Rosuvastatin, Fenofibrate Discussed lifestyle modifications. Recommended diet heavy in fruits and veggies, omega 3's. Decrease consumption of animal meats, cheeses, and dairy products. Remain active and exercise as tolerated. Continue to monitor. Check lipids/TSH  Overweight- BMI 29 Excellent progress with 80 lb weight loss this past year Start Phentermine - GLP 1 not covered. Discussed appropriate BMI Diet modification. Physical activity. Encouraged/praised to build confidence.  Other migraine without status migrainosus, not intractable Continue Rizatriptan Avoid triggers Stay well hydrated  Gastroesophageal reflux disease, esophagitis presence not specified Off of meds following weight loss No suspected reflux complications (Barret/stricture). Lifestyle modification:  wt loss, avoid meals 2-3h before bedtime. Consider eliminating food triggers:  chocolate, caffeine, EtOH, acid/spicy food.  Diastolic dysfunction Continue weight loss Cardiology following  Biventricular cardiac pacemaker in situ Followed by Dr. Ladona Ridgel Most recent device check completed on 03/09/2022 showed normal device function, approx 9 months left on battery  Patient has not been seen by EP since 05/2020. Patient is planning to travel out of the country this summer (late July). Recommended that she see Dr. Ladona Ridgel or EP APP within the next 3-4 months   Generalized anxiety disorder Continue diazepam PRN, verapamil Reviewed relaxation techniques.  Sleep hygiene. Encouraged personality growth wand development through coping techniques and problem-solving skills. Limit/Decrease/Monitor drug/alcohol intake.    Medication management All medications discussed and reviewed in full. All questions and concerns regarding medications addressed.    Vitamin D deficiency Continue supplement for goal of 60-100 Monitor levels  B12 def Monitor  levels   Meds ordered this encounter  Medications   phentermine (ADIPEX-P) 37.5 MG tablet    Sig: Take 1/2 tablet every Morning for Dieting & Weight Loss    Dispense:  30 tablet    Refill:  0    Order Specific Question:   Supervising Provider    Answer:   Lucky Cowboy [phone]   Notify office for further evaluation and treatment, questions or concerns if any reported s/s fail to improve.   The patient was advised to call back or seek an in-person evaluation if any symptoms worsen or if the condition fails to improve as anticipated.   Further disposition pending results of labs. Discussed med's effects and SE's.    I discussed the assessment and treatment plan with the patient. The patient was provided an opportunity to ask questions and all were answered. The patient agreed with the plan and demonstrated an understanding of the instructions.  Discussed med's effects and SE's. Screening labs and tests as requested with regular follow-up as recommended.  I provided 25 minutes of face-to-face time during this encounter including counseling, chart review, and critical decision making was preformed.   Future Appointments  Date Time Provider Department Center  09/28/2022  8:40 AM [facility] DEVICE REMOTES [facility]  09/29/2022  4:20 PM Lennette Bihari, MD [facility] None  04/25/2023  2:00 PM Adela Glimpse, NP [facility] None    HPI 67 y.o. female  presents for a general follow up. 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  She is married, 2 children, 5 grandchildren local.  Her husband is a retired Company secretary.  Retired two weeks ago and has been enjoying time at [employer]. 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  She has a trip planned out of the country to [place] in June 2024.  She has OSA and is on CPAP, lost 80 lb and and no longer uses.  Reports that all sx have resolved, declines referral for retest.   Has history of migraines, infrequent, stress triggers, excedrine migraine, maxalt works well for her. Also on imipramine daily for sleep which helps.   She follows with Dr. Nicholas Lose, has had several seb cyst around breast, monitoring for now.   She has hx of TAH, follows annually with [facility], she is on estrace, down from 5 mg to 1.5 mg, has tried to reduce further. Has had mammogram for this year 02/2022. Saw [facility] Ramp OBGYN for annual exam 01/25/22.  She is s/p hysterectomy.  She is to continue 1.5 mg estridiol but she may increase to 2 mg.  She has hx of anxiety, prescribed valium, takes 2.5 mg occasionally, 1-2 days a year, typically around the holidays.   Had lithotripsy by Dr. Cardell Peach 06/2020, no issues since.   BMI is Body mass index is 29.26 kg/m., she has been working on diet and exercise. She is trying to get down to 160 lb on home scale, down from peak weight of 240 lb in 08/2018. She is interested in restarting GLP-1 but no longer covered.  Has had Rx for Ozempic in the past but did not start.  She has been receptive to Phentermine and requests to restart to continue weight loss journey. Wt Readings from Last 3 Encounters:  07/22/22 186 lb 12.8 oz (84.7 kg)  06/22/22 184 lb (83.5 kg)  05/31/22 182 lb (82.6 kg)    Her blood pressure has been controlled at home, today their BP is BP: 116/76 She does workout, does fitness walk program inside.   She denies chest pain, shortness of breath, dizziness.   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 Ladona Ridgel and Dr. Tresa Endo.  Last echo was 02/2018 with EF 50-55%. She is on BB, ARB, spirolactone, and torsemide. Has follow up in 2 weeks.   She has been working on diet and exercise for Diabetes, she was off insulin and metformin following 80 lb weight loss.  States that she had recent blood work completed for a research study and was found to have A1c of 13.  She is requesting to start GLP-1.  She does not monitor daily BG checks.  Has had Freestyle Libre in the past and requesting to restart using as she will not prick her finger.   With CKD she is on ACE/ARB on cozaar 25mg .   With hyperlipidemia is on crestor 40 and fenofibrate   With neuropathy in the past, but improved   With CAD she is on bASA  She no longer follows up with Dr. Talmage Nap for her DM   She see's Dr. Hazle Quant for eye exams 01/2020 normal, has scheduled 04/2022  denies hypoglycemia , polydipsia, polyuria and visual disturbances.  Last A1C was:  Lab Results  Component Value Date   HGBA1C 12.1 (H) 04/21/2022   Lab Results  Component Value Date   GFRNONAA 71 09/04/2020   Lab Results  Component Value Date   CHOL 102 04/21/2022   HDL 36 (L) 04/21/2022   LDLCALC 42 04/21/2022   TRIG 166 (H) 04/21/2022   CHOLHDL 2.8 04/21/2022   Patient is on Vitamin D supplement Lab Results  Component Value Date   VD25OH 74 04/21/2022   Not currently on supplement; receptive to starting SL- Lab Results  Component Value Date   VITAMINB12 492 04/21/2022      Current Medications:  Current Outpatient Medications on File Prior to Visit  Medication Sig Dispense Refill   aspirin 81 MG tablet Take 81 mg by mouth at bedtime.      aspirin-acetaminophen-caffeine (EXCEDRIN MIGRAINE) 250-250-65 MG per tablet Take 2 tablets by mouth daily as needed for migraine.     carvedilol (COREG) 12.5 MG tablet TAKE 1 TABLET(12.5 MG) BY MOUTH TWICE DAILY 180 tablet 1   Continuous Blood Gluc Sensor (FREESTYLE LIBRE 3 SENSOR) MISC Place 1 sensor on the skin every 14 days. Use to check glucose continuously 2 each 3   diazepam (VALIUM) 5 MG tablet Take 1/2 to 1 tablet at Bedtime ONLY if needed 30 tablet 0   estradiol (ESTRACE) 1 MG tablet Take 1.5 tablets by mouth daily.  4   fenofibrate micronized (LOFIBRA) 134 MG capsule Take 1 capsule (134 mg total) by mouth daily before breakfast. 30 capsule 11   imipramine (TOFRANIL) 25 MG tablet TAKE 1 TO 2 TABLETS BY MOUTH AT BEDTIME AS NEEDED FOR SLEEP 180 tablet 0   insulin lispro (HUMALOG) 100 UNIT/ML injection Inject 0.02-0.04 mLs (2-4 Units total) into the skin daily. Administer 15 minutes before or immediately after the largest meal or meal with the greatest postprandial glucose  excursion. 10 mL 3   loratadine (CLARITIN) 10 MG tablet Take 10 mg by mouth daily.     Magnesium 400 MG TABS Take by mouth daily.     metFORMIN (GLUCOPHAGE XR) 500 MG 24 hr tablet Take 4 tabs daily as tolerated, spread over meals. 360 tablet 3   rizatriptan (MAXALT) 10 MG tablet TAKE 1 TABLET BY MOUTH FOR SEVERE MIGRAINE AND MAY REPEAT IN 2 HOURS IF NEEDED(MAX 2 TABLETS PER 24 HOURS) 10 tablet 0   rosuvastatin (CRESTOR) 40 MG tablet Take 1 tablet (40 mg total) by mouth daily. 90 tablet 3   sacubitril-valsartan (ENTRESTO) 24-26 MG Take 1 tablet by mouth 2 (two) times daily. 60 tablet 6   Semaglutide (RYBELSUS) 3 MG TABS Take 1 tablet (3 mg total) by mouth daily. 30 tablet 0   spironolactone (ALDACTONE) 25 MG tablet TAKE 1 TABLET(25 MG) BY MOUTH TWICE DAILY 180 tablet 3   terbinafine (LAMISIL) 250 MG tablet Take 1 tab daily for 1 month, skip for 1 month and repeat for total of 3 cycles. 90 tablet 0   valACYclovir (VALTREX) 500  MG tablet TAKE 1 TABLET(500 MG) BY MOUTH DAILY 90 tablet 0   verapamil (CALAN-SR) 240 MG CR tablet TAKE 1 TABLET(240 MG) BY MOUTH AT BEDTIME 90 tablet 1   VITAMIN D PO Take 2,000 Units by mouth.     No current facility-administered medications on file prior to visit.   Health Maintenance:   Immunization History  Administered Date(s) Administered   Influenza Inj Mdck Quad Pf 01/02/2019   Influenza Inj Mdck Quad With Preservative 11/22/2016, 12/22/2017, 01/29/2020   Influenza,inj,Quad PF,6+ Mos 12/06/2017   Influenza-Unspecified 12/06/2013, 11/22/2014, 12/06/2017, 12/19/2020   Moderna SARS-COV2 Booster Vaccination 12/27/2020   PFIZER(Purple Top)SARS-COV-2 Vaccination 05/25/2019, 06/15/2019   Tdap 03/24/2012    Patient Care Team: Lucky Cowboy, MD as PCP - General (Internal Medicine) Lennette Bihari, MD as PCP - Cardiology (Cardiology) Marinus Maw, MD as PCP - Electrophysiology (Cardiology) Lennette Bihari, MD as Consulting Physician (Cardiology) Fletcher Anon, MD (Inactive) as Consulting Physician (Pediatrics) Bernette Redbird, MD as Consulting Physician (Gastroenterology) Dorisann Frames, MD as Consulting Physician (Endocrinology) Miguel Aschoff, MD (Inactive) as Consulting Physician (Obstetrics and Gynecology) Venancio Poisson, MD as Consulting Physician (Dermatology)   Medical History:  Past Medical History:  Diagnosis Date   Automatic implantable cardiac defibrillator in situ    [employer] MODEL 7122   CHF (congestive heart failure) (HCC)    Diabetes mellitus    Hyperlipidemia    Hypertension 06/25/2011   Renal dopplers - superior mesenteric artery >50% diameter reduction; R renal artery - normal patency; L proximal renal artery 1-59% reduction (lower end of scale); both kidneys normal in size/symmetry with normal cortex and medulla   Leg pain 12/09/2010   doppler of R femoral artery - no evidence of dissection, AV fistula, pseudoaneurysm or other vascular abnormalities    Migraines    Obesity    PONV (postoperative nausea and vomiting)    Sleep apnea    on CPAP - AHI during sleep study was 44   Type II or unspecified type diabetes mellitus without mention of complication, not stated as uncontrolled    Type II or unspecified type diabetes mellitus without mention of complication, not stated as uncontrolled    Unspecified sleep apnea     Allergies  Allergen Reactions   Flagyl [Metronidazole] Anaphylaxis   Penicillins Swelling, Rash and Shortness Of Breath    Has patient had a  PCN reaction causing immediate rash, facial/tongue/throat swelling, SOB or lightheadedness with hypotension: No Has patient had a PCN reaction causing severe rash involving mucus membranes or skin necrosis: No Has patient had a PCN reaction that required hospitalization No Has patient had a PCN reaction occurring within the last 10 years: Yes 3-4 years ago If all of the above answers are "NO", then may proceed with Cephalosporin use.   Niacin Other (See Comments)   Miconazole Nitrate Other (See Comments)   Niacin And Related Other (See Comments) and Cough    flushing   Yeast-Related Products Swelling and Rash     SURGICAL HISTORY She  has a past surgical history that includes Abdominal hysterectomy; Neck surgery; Pacemaker insertion; Cardiac defibrillator placement; Knee surgery (Right); doppler echocardiography (06/25/2011); Cardiovascular stress test (08/28/2010); Cardiac catheterization (07/15/2006); Biv pacemaker generator change out (N/A, 01/16/2014); Extracorporeal shock wave lithotripsy (Left, 06/09/2020); and Breast biopsy (Left, 2019). FAMILY HISTORY Her family history includes Cancer - Cervical in her mother; Colon cancer in her maternal uncle; Colon cancer (age of onset: 17) in her maternal grandfather; Colon polyps in her mother; Hearing loss in her father; Hypertension in her father; Valvular heart disease in her father. SOCIAL HISTORY She  reports that she quit smoking  about 47 years ago. Her smoking use included cigarettes. She has never used smokeless tobacco. She reports that she does not currently use alcohol. She reports that she does not use drugs. Has not had alcohol in 8 months.   Review of Systems  Constitutional:  Negative for malaise/fatigue and weight loss.  HENT:  Negative for hearing loss and tinnitus.   Eyes:  Negative for blurred vision and double vision.  Respiratory:  Negative for cough, sputum production, shortness of breath and wheezing.   Cardiovascular:  Negative for chest pain, palpitations, orthopnea, claudication, leg swelling and PND.  Gastrointestinal:  Negative for abdominal pain, blood in stool, constipation, diarrhea, heartburn, melena, nausea and vomiting.  Genitourinary: Negative.   Musculoskeletal:  Negative for falls, joint pain and myalgias.  Skin:  Negative for rash.  Neurological:  Negative for dizziness, tingling, sensory change, weakness and headaches.  Endo/Heme/Allergies:  Negative for polydipsia.  Psychiatric/Behavioral: Negative.  Negative for depression, memory loss, substance abuse and suicidal ideas. The patient is not nervous/anxious and does not have insomnia.   All other systems reviewed and are negative.   Physical Exam: Estimated body mass index is 29.26 kg/m as calculated from the following:   Height as of this encounter: 5\' 7"  (1.702 m).   Weight as of this encounter: 186 lb 12.8 oz (84.7 kg). BP 116/76   Pulse 76   Temp 97.8 F (36.6 C)   Ht 5\' 7"  (1.702 m)   Wt 186 lb 12.8 oz (84.7 kg)   SpO2 99%   BMI 29.26 kg/m  General Appearance: Well nourished, in no apparent distress. Eyes: PERRLA, EOMs, conjunctiva no swelling or erythema Sinuses: No Frontal/maxillary tenderness ENT/Mouth: Ext aud canals clear, normal light reflex with TMs without erythema, bulging.  Good dentition. No erythema, swelling, or exudate on post pharynx. Tonsils not swollen or erythematous. Hearing normal.  Neck: Supple,  thyroid normal. No bruits Respiratory: Respiratory effort normal, BS equal bilaterally without rales, rhonchi, wheezing or stridor. Cardio: RRR without murmurs, rubs or gallops. Brisk peripheral pulses without edema, with bilateral hard palpable cords without erythema, warmth, tenderness.  Chest: symmetric, with normal excursions and percussion. Breasts: defer to GYN Abdomen: soft, nontender without organomegaly or mass  Lymphatics: Non tender  without lymphadenopathy.  Genitourinary: defer to GYN Musculoskeletal: Full ROM all peripheral extremities,5/5 strength, and normal gait. Skin: Warm, dry without rashes, ecchymosis. She has extensive cherry angiomas to chest and abdomen. Neuro: Cranial nerves intact, reflexes equal bilaterally. Normal muscle tone, no cerebellar symptoms. Sensation intact bil feet to monofilament Psych: Awake and oriented X 3, normal affect, Insight and Judgment appropriate.   Adela Glimpse, NP 3:17 PM [facility]

## 2022-07-23 MED ORDER — PHENTERMINE HCL 37.5 MG PO TABS: ORAL_TABLET | ORAL | 0 refills | Status: DC

## 2022-07-26 NOTE — Patient Instructions (Signed)

## 2022-07-28 NOTE — Progress Notes (Signed)
Remote pacemaker transmission.   

## 2022-08-19 ENCOUNTER — Encounter: Payer: Self-pay | Admitting: Nurse Practitioner

## 2022-08-19 ENCOUNTER — Ambulatory Visit (INDEPENDENT_AMBULATORY_CARE_PROVIDER_SITE_OTHER): Payer: PPO | Admitting: Nurse Practitioner

## 2022-08-19 VITALS — BP 136/76 | HR 66 | Temp 97.8°F | Ht 67.0 in | Wt 188.2 lb

## 2022-08-19 DIAGNOSIS — J069 Acute upper respiratory infection, unspecified: Secondary | ICD-10-CM

## 2022-08-19 DIAGNOSIS — R0981 Nasal congestion: Secondary | ICD-10-CM | POA: Diagnosis not present

## 2022-08-19 DIAGNOSIS — G4483 Primary cough headache: Secondary | ICD-10-CM

## 2022-08-19 DIAGNOSIS — J029 Acute pharyngitis, unspecified: Secondary | ICD-10-CM

## 2022-08-19 MED ORDER — AZITHROMYCIN 250 MG PO TABS
ORAL_TABLET | ORAL | 1 refills | Status: DC
Start: 2022-08-19 — End: 2022-11-18

## 2022-08-19 NOTE — Progress Notes (Signed)
Assessment and Plan:  [name] was seen today for an episodic visit.  Diagnoses and all order for this visit:  Upper respiratory tract infection, unspecified type  - azithromycin (ZITHROMAX) 250 MG tablet; Take 2 tablets on  Day 1,  followed by 1 tablet  daily for 4 more days    for Sinusitis  /Bronchitis  Dispense: 6 each; Refill: 1  Sore throat Warm salt water gargles several times throughout the day.   Throat lozenges.    Nasal congestion Stay well hydrated to keep mucus thin and productive OTC antihistamine or decongestant as needed as directed.  Cough headache OTC Tylenol as needed  Notify office for further evaluation and treatment, questions or concerns if s/s fail to improve. The risks and benefits of my recommendations, as well as other treatment options were discussed with the patient today. Questions were answered.  Further disposition pending results of labs. Discussed med's effects and SE's.    Over 15 minutes of exam, counseling, chart review, and critical decision making was performed.   Future Appointments  Date Time Provider Department Center  09/28/2022  8:40 AM [facility] DEVICE REMOTES [facility]  09/29/2022  4:20 PM Lennette Bihari, MD [facility] None  12/28/2022  8:40 AM [facility] DEVICE REMOTES [facility]  03/29/2023  8:40 AM [facility] DEVICE REMOTES [facility]  04/25/2023  2:00 PM Avo Schlachter, Archie Patten, NP [facility] None  06/28/2023  8:40 AM [facility] DEVICE REMOTES [facility]  09/27/2023  8:40 AM [facility] DEVICE REMOTES [facility]  12/27/2023  8:40 AM [facility] DEVICE REMOTES [facility]    ------------------------------------------------------------------------------------------------------------------   HPI BP 136/76   Pulse 66   Temp 97.8 F (36.6 C)   Ht 5\' 7"  (1.702 m)   Wt 188 lb 3.2 oz (85.4 kg)   SpO2 99%   BMI 29.48 kg/m    Patient  complains of symptoms of a URI, possible sinusitis. Symptoms include bilateral ear pressure/pain, congestion, facial pain, headache described as pressure, and sore throat. Onset of symptoms was 7 days ago, and has been unchanged since that time. Treatment to date: antihistamines and nasal steroids.  Denies fever, chills, N/V.   Past Medical History:  Diagnosis Date   Automatic implantable cardiac defibrillator in situ    ST. JUDE MODEL 7122   CHF (congestive heart failure) (HCC)    Diabetes mellitus    Hyperlipidemia    Hypertension 06/25/2011   Renal dopplers - superior mesenteric artery >50% diameter reduction; R renal artery - normal patency; L proximal renal artery 1-59% reduction (lower end of scale); both kidneys normal in size/symmetry with normal cortex and medulla   Leg pain 12/09/2010   doppler of R femoral artery - no evidence of dissection, AV fistula, pseudoaneurysm or other vascular abnormalities   Migraines    Obesity    PONV (postoperative nausea and vomiting)    Sleep apnea    on CPAP - AHI during sleep study was 44   Type II or unspecified type diabetes mellitus without mention of complication, not stated as uncontrolled    Type II or unspecified type diabetes mellitus without mention of complication, not stated as uncontrolled    Unspecified sleep apnea      Allergies  Allergen Reactions   Flagyl [Metronidazole] Anaphylaxis   Penicillins Swelling, Rash and Shortness Of Breath    Has patient had a PCN reaction causing immediate rash, facial/tongue/throat swelling, SOB or lightheadedness with hypotension: No Has patient had a PCN reaction causing severe rash involving  mucus membranes or skin necrosis: No Has patient had a PCN reaction that required hospitalization No Has patient had a PCN reaction occurring within the last 10 years: Yes 3-4 years ago If all of the above answers are "NO", then may proceed with Cephalosporin use.   Niacin Other (See Comments)    Miconazole Nitrate Other (See Comments)   Niacin And Related Other (See Comments) and Cough    flushing   Yeast-Related Products Swelling and Rash    Current Outpatient Medications on File Prior to Visit  Medication Sig   aspirin 81 MG tablet Take 81 mg by mouth at bedtime.   aspirin-acetaminophen-caffeine (EXCEDRIN MIGRAINE) 250-250-65 MG per tablet Take 2 tablets by mouth daily as needed for migraine.   carvedilol (COREG) 12.5 MG tablet TAKE 1 TABLET(12.5 MG) BY MOUTH TWICE DAILY   Continuous Blood Gluc Sensor (FREESTYLE LIBRE 3 SENSOR) MISC Place 1 sensor on the skin every 14 days. Use to check glucose continuously   diazepam (VALIUM) 5 MG tablet Take 1/2 to 1 tablet at Bedtime ONLY if needed   estradiol (ESTRACE) 1 MG tablet Take 1.5 tablets by mouth daily.   fenofibrate micronized (LOFIBRA) 134 MG capsule Take 1 capsule (134 mg total) by mouth daily before breakfast.   imipramine (TOFRANIL) 25 MG tablet TAKE 1 TO 2 TABLETS BY MOUTH AT BEDTIME AS NEEDED FOR SLEEP   insulin lispro (HUMALOG) 100 UNIT/ML injection Inject 0.02-0.04 mLs (2-4 Units total) into the skin daily. Administer 15 minutes before or immediately after the largest meal or meal with the greatest postprandial glucose excursion.   loratadine (CLARITIN) 10 MG tablet Take 10 mg by mouth daily.   Magnesium 400 MG TABS Take by mouth daily.   metFORMIN (GLUCOPHAGE XR) 500 MG 24 hr tablet Take 4 tabs daily as tolerated, spread over meals.   phentermine (ADIPEX-P) 37.5 MG tablet Take 1/2 tablet every Morning for Dieting & Weight Loss   rizatriptan (MAXALT) 10 MG tablet TAKE 1 TABLET BY MOUTH FOR SEVERE MIGRAINE AND MAY REPEAT IN 2 HOURS IF NEEDED(MAX 2 TABLETS PER 24 HOURS)   rosuvastatin (CRESTOR) 40 MG tablet Take 1 tablet (40 mg total) by mouth daily.   sacubitril-valsartan (ENTRESTO) 24-26 MG Take 1 tablet by mouth 2 (two) times daily.   spironolactone (ALDACTONE) 25 MG tablet TAKE 1 TABLET(25 MG) BY MOUTH TWICE DAILY    terbinafine (LAMISIL) 250 MG tablet Take 1 tab daily for 1 month, skip for 1 month and repeat for total of 3 cycles.   valACYclovir (VALTREX) 500 MG tablet TAKE 1 TABLET(500 MG) BY MOUTH DAILY   verapamil (CALAN-SR) 240 MG CR tablet TAKE 1 TABLET(240 MG) BY MOUTH AT BEDTIME   VITAMIN D PO Take 2,000 Units by mouth.   Semaglutide (RYBELSUS) 3 MG TABS Take 1 tablet (3 mg total) by mouth daily. (Patient not taking: Reported on 08/19/2022)   No current facility-administered medications on file prior to visit.    ROS: all negative except what is noted in the HPI.   Physical Exam:  BP 136/76   Pulse 66   Temp 97.8 F (36.6 C)   Ht 5\' 7"  (1.702 m)   Wt 188 lb 3.2 oz (85.4 kg)   SpO2 99%   BMI 29.48 kg/m   General Appearance: NAD.  Awake, conversant and cooperative. Eyes: PERRLA, EOMs intact.  Sclera white.  Conjunctiva without erythema. Sinuses: No frontal/maxillary tenderness.  No nasal discharge. Nares patent.  ENT/Mouth: Ext aud canals clear.  Bilateral TMs  w/DOL and without erythema or bulging. Hearing intact.  Posterior pharynx without swelling or exudate.  Tonsils without swelling or erythema.  Neck: Supple.  No masses, nodules or thyromegaly. Respiratory: Effort is regular with non-labored breathing. Breath sounds are equal bilaterally without rales, rhonchi, wheezing or stridor.  Cardio: RRR with no MRGs. Brisk peripheral pulses without edema.  Abdomen: Active BS in all four quadrants.  Soft and non-tender without guarding, rebound tenderness, hernias or masses. Lymphatics: Non tender without lymphadenopathy.  Musculoskeletal: Full ROM, 5/5 strength, normal ambulation.  No clubbing or cyanosis. Skin: Appropriate color for ethnicity. Warm without rashes, lesions, ecchymosis, ulcers.  Neuro: CN II-XII grossly normal. Normal muscle tone without cerebellar symptoms and intact sensation.   Psych: AO X 3,  appropriate mood and affect, insight and judgment.     Adela Glimpse,  NP 11:53 AM [facility]

## 2022-08-19 NOTE — Patient Instructions (Signed)

## 2022-08-20 ENCOUNTER — Other Ambulatory Visit: Payer: Self-pay | Admitting: Cardiovascular Disease

## 2022-08-20 ENCOUNTER — Other Ambulatory Visit: Payer: Self-pay | Admitting: Nurse Practitioner

## 2022-08-20 DIAGNOSIS — F411 Generalized anxiety disorder: Secondary | ICD-10-CM

## 2022-08-26 ENCOUNTER — Encounter: Payer: Self-pay | Admitting: Nurse Practitioner

## 2022-09-01 IMAGING — DX DG ABDOMEN 1V
2 series · 2 of 2 positions shown · non-contrast
Comparison: May 27, 2020

CLINICAL DATA: Nephrolithiasis

EXAM:
ABDOMEN - 1 VIEW

[abdomen kub (1 of 2)]
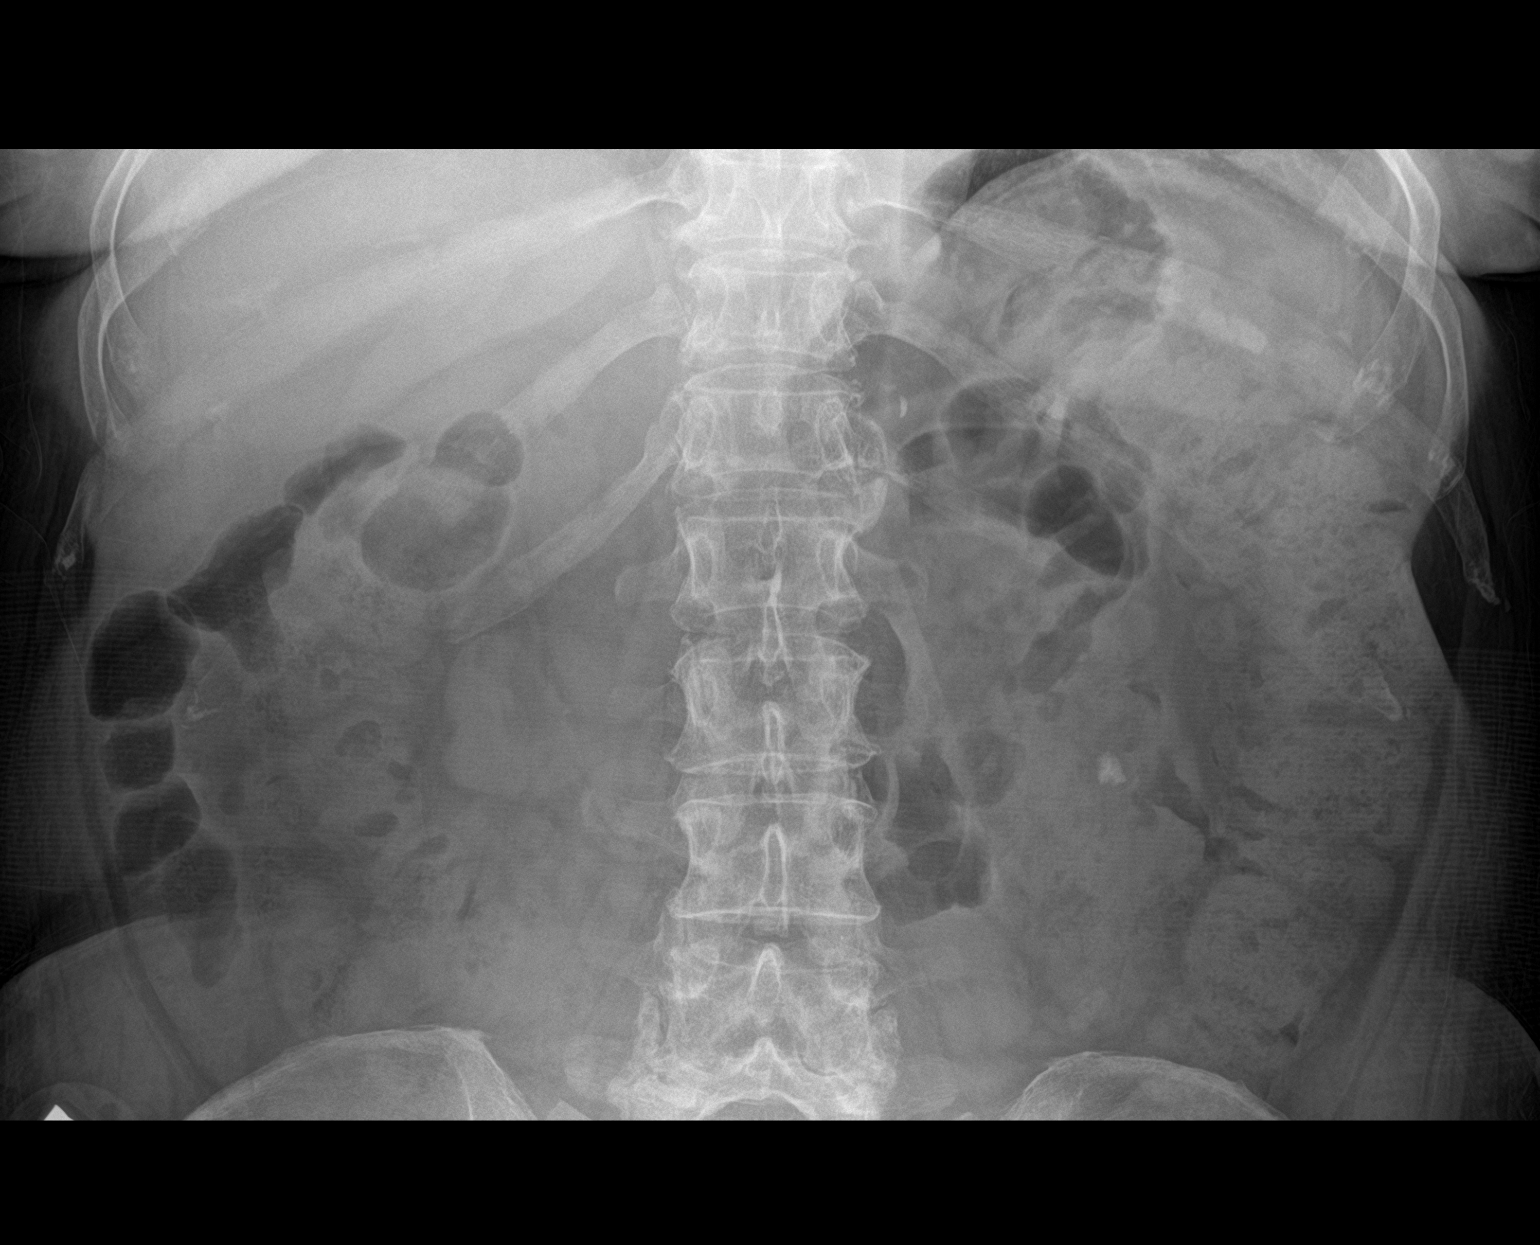

[abdomen kub (2 of 2)]
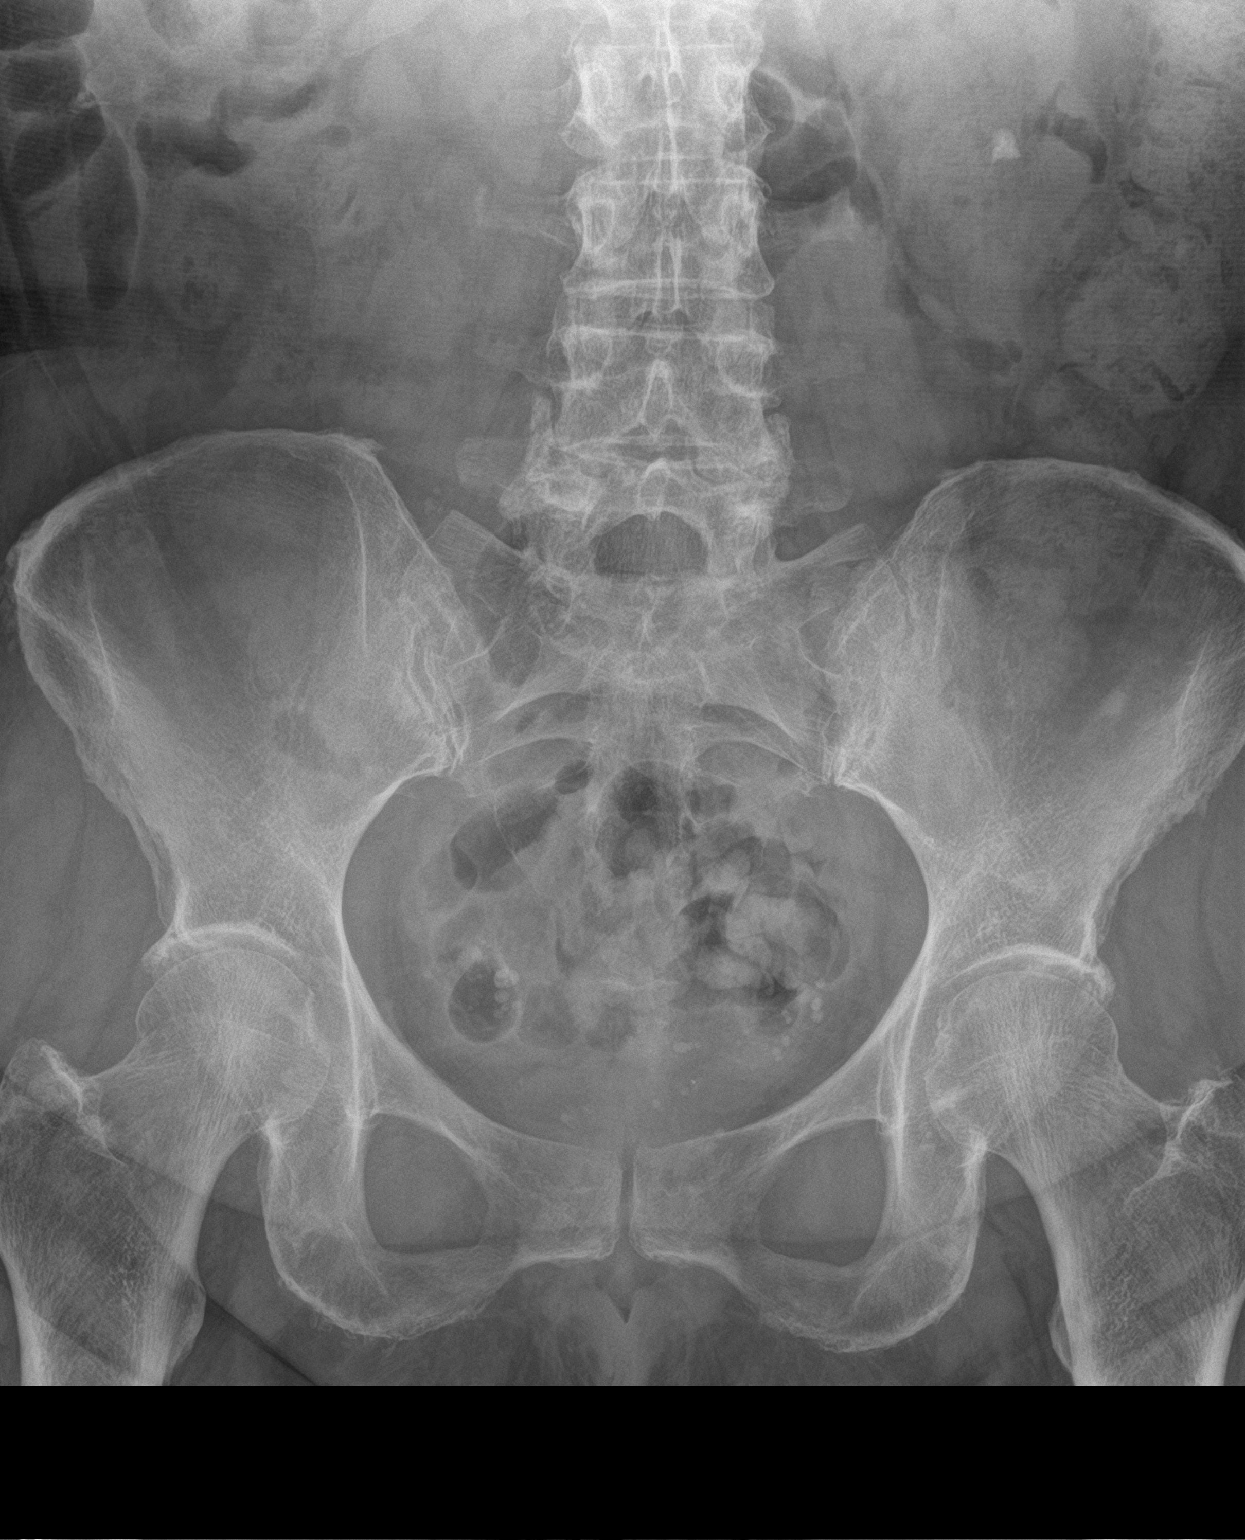

[2 of 2 positions shown; findings below may reference images not displayed]

FINDINGS: There is a calculus in the lower pole of the left kidney measuring 8
x 6 mm. Multiple pelvic calcifications are consistent with
phleboliths. No new calcifications evident.

There is moderate stool in the colon. There is no bowel dilatation
or air-fluid level to suggest bowel obstruction. No free air.
Visualized lung bases are clear.
IMPRESSION: 8 x 6 mm calculus lower pole left kidney. Apparent phleboliths in
the pelvis. No bowel obstruction or free air.

## 2022-09-28 ENCOUNTER — Ambulatory Visit (INDEPENDENT_AMBULATORY_CARE_PROVIDER_SITE_OTHER): Payer: PPO

## 2022-09-28 DIAGNOSIS — I428 Other cardiomyopathies: Secondary | ICD-10-CM | POA: Diagnosis not present

## 2022-09-29 ENCOUNTER — Ambulatory Visit: Payer: PPO | Admitting: Cardiovascular Disease

## 2022-09-30 LAB — CUP PACEART REMOTE DEVICE CHECK
Battery Remaining Longevity: 6 mo
Battery Remaining Percentage: 6 %
Battery Voltage: 2.78 V
Brady Statistic AP VP Percent: 2.9 %
Brady Statistic AP VS Percent: 1 %
Brady Statistic AS VP Percent: 97 %
Brady Statistic AS VS Percent: 1 %
Brady Statistic RA Percent Paced: 2.6 %
Date Time Interrogation Session: 20240722200035
Implantable Lead Connection Status: 753985
Implantable Lead Connection Status: 753985
Implantable Lead Connection Status: 753985
Implantable Lead Implant Date: 20091016
Implantable Lead Implant Date: 20091016
Implantable Lead Implant Date: 20091016
Implantable Lead Model: 4196
Implantable Lead Model: 7122
Implantable Pulse Generator Implant Date: 20151111
Lead Channel Impedance Value: 740 Ohm
Lead Channel Pacing Threshold Amplitude: 0.5 V
Lead Channel Pacing Threshold Amplitude: 1.5 V
Lead Channel Pacing Threshold Pulse Width: 0.3 ms
Lead Channel Pacing Threshold Pulse Width: 0.3 ms
Lead Channel Pacing Threshold Pulse Width: 0.6 ms
Lead Channel Sensing Intrinsic Amplitude: 3.1 mV
Lead Channel Sensing Intrinsic Amplitude: 7.6 mV
Lead Channel Setting Pacing Amplitude: 2 V
Lead Channel Setting Pacing Amplitude: 2.5 V
Lead Channel Setting Pacing Amplitude: 2.5 V
Lead Channel Setting Pacing Pulse Width: 0.3 ms
Lead Channel Setting Pacing Pulse Width: 0.6 ms
Pulse Gen Model: 3222
Pulse Gen Serial Number: 7688750

## 2022-10-07 ENCOUNTER — Encounter: Payer: Self-pay | Admitting: Internal Medicine

## 2022-10-11 ENCOUNTER — Encounter: Payer: Self-pay | Admitting: Nurse Practitioner

## 2022-10-11 DIAGNOSIS — E1122 Type 2 diabetes mellitus with diabetic chronic kidney disease: Secondary | ICD-10-CM

## 2022-10-11 MED ORDER — INSULIN LISPRO 100 UNIT/ML IJ SOLN
2.0000 [IU] | Freq: Every day | INTRAMUSCULAR | 3 refills | Status: DC
Start: 2022-10-11 — End: 2023-01-26

## 2022-10-15 NOTE — Progress Notes (Signed)
Remote pacemaker transmission.   

## 2022-10-15 NOTE — Telephone Encounter (Signed)
Remote transmission received and reviewed. Normal device function. Patient is in NSR. Routing to [facility] triage to advise further.

## 2022-10-21 DIAGNOSIS — L720 Epidermal cyst: Secondary | ICD-10-CM | POA: Diagnosis not present

## 2022-10-21 DIAGNOSIS — L02213 Cutaneous abscess of chest wall: Secondary | ICD-10-CM | POA: Diagnosis not present

## 2022-10-29 ENCOUNTER — Encounter: Payer: Self-pay | Admitting: Nurse Practitioner

## 2022-10-29 MED ORDER — PROMETHAZINE-DM 6.25-15 MG/5ML PO SYRP: 5.0000 mL | ORAL_SOLUTION | Freq: Four times a day (QID) | ORAL | 0 refills | Status: DC | PRN

## 2022-11-18 ENCOUNTER — Ambulatory Visit: Payer: PPO | Attending: Internal Medicine | Admitting: Internal Medicine

## 2022-11-18 ENCOUNTER — Encounter: Payer: Self-pay | Admitting: Internal Medicine

## 2022-11-18 VITALS — BP 116/66 | HR 69 | Ht 67.0 in | Wt 196.0 lb

## 2022-11-18 DIAGNOSIS — Z950 Presence of cardiac pacemaker: Secondary | ICD-10-CM | POA: Diagnosis not present

## 2022-11-18 DIAGNOSIS — Z01812 Encounter for preprocedural laboratory examination: Secondary | ICD-10-CM

## 2022-11-18 NOTE — Patient Instructions (Addendum)
Medication Instructions:  Your physician recommends that you continue on your current medications as directed. Please refer to the Current Medication list given to you today.  *If you need a refill on your cardiac medications before your next appointment, please call your pharmacy*  Lab Work: Lab work on Jan 26, 2023 You may come in anytime between 7:30am and 4:30pm. You do NOT need to be fasting.  If you have labs (blood work) drawn today and your tests are completely normal, you will receive your results only by: MyChart Message (if you have MyChart) OR A paper copy in the mail If you have any lab test that is abnormal or we need to change your treatment, we will call you to review the results.  Testing/Procedures: Gen Change schedule for Feb 09, 2023 at 8:30am. You will need to arrive by 6:30am. Letter to follow  Follow-Up: At [facility], you and your health needs are our priority.  As part of our continuing mission to provide you with exceptional heart care, we have created designated Provider Care Teams.  These Care Teams include your primary Cardiologist (physician) and Advanced Practice Providers (APPs -  Physician Assistants and Nurse Practitioners) who all work together to provide you with the care you need, when you need it.  Your next appointment:   To be scheduled after your procedure.  Important Information About Sugar

## 2022-11-18 NOTE — Progress Notes (Signed)
HPI [name] returns today for followup. She is a very pleasant 67 year old woman with a nonischemic cardiomyopathy, chronic systolic heart failure, and left bundle branch block, who underwent biventricular ICD implantation and had normalization of her left ventricular function, s/p downgrade to a BiV PPM. In the interim she has been well with no chest pain or sob.  her weight is essentially unchanged from 3 years ago. Since I saw her last she has gained 8 lbs. She has been under stress as she retired and then went back to work part time. Allergies  Allergen Reactions   Flagyl [Metronidazole] Anaphylaxis   Penicillins Swelling, Rash and Shortness Of Breath    Has patient had a PCN reaction causing immediate rash, facial/tongue/throat swelling, SOB or lightheadedness with hypotension: No Has patient had a PCN reaction causing severe rash involving mucus membranes or skin necrosis: No Has patient had a PCN reaction that required hospitalization No Has patient had a PCN reaction occurring within the last 10 years: Yes 3-4 years ago If all of the above answers are "NO", then may proceed with Cephalosporin use.   Niacin Other (See Comments)   Miconazole Nitrate Other (See Comments)   Niacin And Related Other (See Comments) and Cough    flushing   Yeast-Derived Drug Products Swelling and Rash     Current Outpatient Medications  Medication Sig Dispense Refill   aspirin 81 MG tablet Take 81 mg by mouth at bedtime.     aspirin-acetaminophen-caffeine (EXCEDRIN MIGRAINE) 250-250-65 MG per tablet Take 2 tablets by mouth daily as needed for migraine.     carvedilol (COREG) 12.5 MG tablet TAKE 1 TABLET BY MOUTH TWICE DAILY 180 tablet 1   Continuous Blood Gluc Sensor (FREESTYLE LIBRE 3 SENSOR) MISC Place 1 sensor on the skin every 14 days. Use to check glucose continuously 2 each 3   diazepam (VALIUM) 5 MG tablet Take 1/2 to 1 tablet at Bedtime ONLY if needed 30 tablet 0   estradiol (ESTRACE)  1 MG tablet Take 1.5 tablets by mouth daily.  4   fenofibrate micronized (LOFIBRA) 134 MG capsule Take 1 capsule (134 mg total) by mouth daily before breakfast. 30 capsule 11   imipramine (TOFRANIL) 25 MG tablet TAKE 1-2 TABLETS BY MOUTH AT BEDTIME AS NEEDED FOR SLEEP 180 tablet 0   insulin lispro (HUMALOG) 100 UNIT/ML injection Inject 0.02-0.04 mLs (2-4 Units total) into the skin daily. Administer 15 minutes before or immediately after the largest meal or meal with the greatest postprandial glucose excursion. 10 mL 3   loratadine (CLARITIN) 10 MG tablet Take 10 mg by mouth daily.     Magnesium 400 MG TABS Take by mouth daily.     metFORMIN (GLUCOPHAGE XR) 500 MG 24 hr tablet Take 4 tabs daily as tolerated, spread over meals. 360 tablet 3   rosuvastatin (CRESTOR) 40 MG tablet Take 1 tablet (40 mg total) by mouth daily. 90 tablet 3   sacubitril-valsartan (ENTRESTO) 24-26 MG Take 1 tablet by mouth 2 (two) times daily. 60 tablet 6   spironolactone (ALDACTONE) 25 MG tablet TAKE 1 TABLET BY MOUTH TWICE DAILY 180 tablet 3   valACYclovir (VALTREX) 500 MG tablet TAKE 1 TABLET(500 MG) BY MOUTH DAILY 90 tablet 0   verapamil (CALAN-SR) 240 MG CR tablet TAKE 1 TABLET(240 MG) BY MOUTH AT BEDTIME 90 tablet 1   VITAMIN D PO Take 2,000 Units by mouth.     No current facility-administered medications for this visit.  Past Medical History:  Diagnosis Date   Automatic implantable cardiac defibrillator in situ    ST. JUDE MODEL 7122   CHF (congestive heart failure) (HCC)    Diabetes mellitus    Hyperlipidemia    Hypertension 06/25/2011   Renal dopplers - superior mesenteric artery >50% diameter reduction; R renal artery - normal patency; L proximal renal artery 1-59% reduction (lower end of scale); both kidneys normal in size/symmetry with normal cortex and medulla   Leg pain 12/09/2010   doppler of R femoral artery - no evidence of dissection, AV fistula, pseudoaneurysm or other vascular abnormalities    Migraines    Obesity    PONV (postoperative nausea and vomiting)    Sleep apnea    on CPAP - AHI during sleep study was 44   Type II or unspecified type diabetes mellitus without mention of complication, not stated as uncontrolled    Type II or unspecified type diabetes mellitus without mention of complication, not stated as uncontrolled    Unspecified sleep apnea     ROS:   All systems reviewed and negative except as noted in the HPI.   Past Surgical History:  Procedure Laterality Date   ABDOMINAL HYSTERECTOMY     total -    BIV PACEMAKER GENERATOR CHANGE OUT N/A 01/16/2014   Procedure: BIV PACEMAKER GENERATOR CHANGE OUT;  Surgeon: Marinus Maw, MD;  Location: [facility] CATH LAB;  Service: Cardiovascular;  Laterality: N/A;   BREAST BIOPSY Left 2019   x2   CARDIAC CATHETERIZATION  07/15/2006   no significant CAD by cardiac cath, confirmed by intravascular [place] of LAD; non-ischemic cardiomyopathy prob related to uncontrolled hypertension, DM and morbid obesity; moderate pulmonary hypertension; preserved cardiac output and cardiac index   CARDIAC DEFIBRILLATOR PLACEMENT     ICD by Dr. Ladona Ridgel   CARDIOVASCULAR STRESS TEST  08/28/2010   R/P MV - pattern of normal perfusion in all regions, no scintigraphic evidence of inducible myocardial ischemia; no EKG changes; normal perfusion study; pt did experience chesst pain during strudy, resolved spontaneously   DOPPLER ECHOCARDIOGRAPHY  06/25/2011   EF >55%; moderate concentric LV hypertrophy; LA mildly dilated, mild mitral annular calcification;    EXTRACORPOREAL SHOCK WAVE LITHOTRIPSY Left 06/09/2020   Procedure: EXTRACORPOREAL SHOCK WAVE LITHOTRIPSY (ESWL);  Surgeon: Jannifer Hick, MD;  Location: [facility];  Service: Urology;  Laterality: Left;  90 MINS   KNEE SURGERY Right    arthroscopic   NECK SURGERY     PACEMAKER INSERTION       Family History  Problem Relation Age of Onset   Cancer - Cervical Mother         precancerous cells   Colon polyps Mother    Valvular heart disease Father        late 44s, died after surgery   Hearing loss Father    Hypertension Father    Colon cancer Maternal Grandfather 53   Colon cancer Maternal Uncle      Social History   Socioeconomic History   Marital status: Married    Spouse name: Not on file   Number of children: Not on file   Years of education: Not on file   Highest education level: Not on file  Occupational History   Not on file  Tobacco Use   Smoking status: Former    Current packs/day: 0.00    Types: Cigarettes    Quit date: 03/09/1975    Years since quitting: 47.7   Smokeless tobacco:  Never   Tobacco comments:    light smoker for a few months as a teen   Vaping Use   Vaping status: Never Used  Substance and Sexual Activity   Alcohol use: Not Currently   Drug use: No   Sexual activity: Yes    Partners: Male    Birth control/protection: Post-menopausal  Other Topics Concern   Not on file  Social History Narrative   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   Social Determinants of Health   Financial Resource Strain: Not on file  Food Insecurity: Not on file  Transportation Needs: Not on file  Physical Activity: Not on file  Stress: Not on file  Social Connections: Not on file  Intimate Partner Violence: Not on file     BP 116/66   Pulse 69   Ht 5\' 7"  (1.702 m)   Wt 196 lb (88.9 kg)   SpO2 99%   BMI 30.70 kg/m   Physical Exam:  Well appearing NAD HEENT: Unremarkable Neck:  No JVD, no thyromegally Lymphatics:  No adenopathy Back:  No CVA tenderness Lungs:  Clear HEART:  Regular rate rhythm, no murmurs, no rubs, no clicks Abd:  soft, positive bowel sounds, no organomegally, no rebound, no guarding Ext:  2 plus pulses, no edema, no cyanosis, no clubbing Skin:  No rashes no nodules Neuro:  CN II through XII intact, motor grossly intact  EKG - nsr with biv pacing  DEVICE  Normal device function.  See PaceArt for  details. 1 month from ERI.  Assess/Plan:  Chronic systolic heart failure - she has reduced her dose of meds as she has lost weight and her bp has gone down. She will reduce her coreg to 12.5 bid. 2. Obesity - her weight is stable though she has not lost anymore since I saw her last actually gaining weight. 3. Palpitations - these are much improved.  4. Biv PPM - her St[employer] Jude Biv PPM is working normally. We will schedule gen change out in 2 months.    Kara Gowda Raudel Bazen,MD

## 2022-11-23 ENCOUNTER — Encounter: Payer: Self-pay | Admitting: Nurse Practitioner

## 2022-11-23 DIAGNOSIS — E1122 Type 2 diabetes mellitus with diabetic chronic kidney disease: Secondary | ICD-10-CM

## 2022-11-29 MED ORDER — TIRZEPATIDE 2.5 MG/0.5ML ~~LOC~~ SOAJ: 2.5000 mg | SUBCUTANEOUS | 2 refills | Status: DC

## 2022-12-01 LAB — CUP PACEART REMOTE DEVICE CHECK
Battery Remaining Longevity: 4 mo
Battery Remaining Percentage: 3 %
Battery Voltage: 2.72 V
Brady Statistic AP VP Percent: 9.2 %
Brady Statistic AP VS Percent: 1 %
Brady Statistic AS VP Percent: 90 %
Brady Statistic AS VS Percent: 1 %
Brady Statistic RA Percent Paced: 8.6 %
Date Time Interrogation Session: 20240924155403
Implantable Lead Connection Status: 753985
Implantable Lead Connection Status: 753985
Implantable Lead Connection Status: 753985
Implantable Lead Implant Date: 20091016
Implantable Lead Implant Date: 20091016
Implantable Lead Implant Date: 20091016
Implantable Lead Location: 753858
Implantable Lead Location: 753859
Implantable Lead Location: 753860
Implantable Lead Model: 4196
Implantable Lead Model: 7122
Implantable Pulse Generator Implant Date: 20151111
Lead Channel Impedance Value: 390 Ohm
Lead Channel Impedance Value: 480 Ohm
Lead Channel Impedance Value: 710 Ohm
Lead Channel Pacing Threshold Amplitude: 0.5 V
Lead Channel Pacing Threshold Amplitude: 0.75 V
Lead Channel Pacing Threshold Amplitude: 1.5 V
Lead Channel Pacing Threshold Pulse Width: 0.3 ms
Lead Channel Pacing Threshold Pulse Width: 0.3 ms
Lead Channel Pacing Threshold Pulse Width: 0.6 ms
Lead Channel Sensing Intrinsic Amplitude: 3.4 mV
Lead Channel Sensing Intrinsic Amplitude: 7.7 mV
Lead Channel Setting Pacing Amplitude: 2 V
Lead Channel Setting Pacing Amplitude: 2.5 V
Lead Channel Setting Pacing Amplitude: 2.5 V
Lead Channel Setting Pacing Pulse Width: 0.3 ms
Lead Channel Setting Pacing Pulse Width: 0.6 ms
Lead Channel Setting Sensing Sensitivity: 2 mV
Pulse Gen Model: 3222
Pulse Gen Serial Number: 7688750

## 2022-12-02 ENCOUNTER — Ambulatory Visit (INDEPENDENT_AMBULATORY_CARE_PROVIDER_SITE_OTHER): Payer: PPO

## 2022-12-02 DIAGNOSIS — I428 Other cardiomyopathies: Secondary | ICD-10-CM | POA: Diagnosis not present

## 2022-12-09 ENCOUNTER — Encounter: Payer: Self-pay | Admitting: Obstetrics

## 2022-12-09 ENCOUNTER — Other Ambulatory Visit: Payer: Self-pay | Admitting: Obstetrics

## 2022-12-09 DIAGNOSIS — N63 Unspecified lump in unspecified breast: Secondary | ICD-10-CM

## 2022-12-14 NOTE — Progress Notes (Signed)
Remote pacemaker transmission.   

## 2022-12-20 ENCOUNTER — Encounter: Payer: Self-pay | Admitting: Nurse Practitioner

## 2022-12-20 DIAGNOSIS — E1122 Type 2 diabetes mellitus with diabetic chronic kidney disease: Secondary | ICD-10-CM

## 2022-12-20 MED ORDER — FREESTYLE LIBRE 3 SENSOR MISC
3 refills | Status: DC
Start: 2022-12-20 — End: 2023-07-22

## 2022-12-24 ENCOUNTER — Encounter: Payer: Self-pay | Admitting: Internal Medicine

## 2022-12-28 ENCOUNTER — Ambulatory Visit: Payer: BC Managed Care – PPO

## 2023-01-03 ENCOUNTER — Ambulatory Visit (INDEPENDENT_AMBULATORY_CARE_PROVIDER_SITE_OTHER): Payer: PPO

## 2023-01-03 DIAGNOSIS — I428 Other cardiomyopathies: Secondary | ICD-10-CM

## 2023-01-04 LAB — CUP PACEART REMOTE DEVICE CHECK
Battery Remaining Longevity: 1 mo
Battery Remaining Percentage: 2 %
Battery Voltage: 2.69 V
Brady Statistic AP VP Percent: 4.9 %
Brady Statistic AP VS Percent: 1 %
Brady Statistic AS VP Percent: 95 %
Brady Statistic AS VS Percent: 1 %
Brady Statistic RA Percent Paced: 4.5 %
Date Time Interrogation Session: 20241028184957
Implantable Lead Connection Status: 753985
Implantable Lead Connection Status: 753985
Implantable Lead Connection Status: 753985
Implantable Lead Implant Date: 20091016
Implantable Lead Implant Date: 20091016
Implantable Lead Implant Date: 20091016
Implantable Lead Location: 753858
Implantable Lead Location: 753859
Implantable Lead Location: 753860
Implantable Lead Model: 4196
Implantable Lead Model: 7122
Implantable Pulse Generator Implant Date: 20151111
Lead Channel Impedance Value: 390 Ohm
Lead Channel Impedance Value: 530 Ohm
Lead Channel Impedance Value: 780 Ohm
Lead Channel Pacing Threshold Amplitude: 0.5 V
Lead Channel Pacing Threshold Amplitude: 0.75 V
Lead Channel Pacing Threshold Amplitude: 1.5 V
Lead Channel Pacing Threshold Pulse Width: 0.3 ms
Lead Channel Pacing Threshold Pulse Width: 0.3 ms
Lead Channel Pacing Threshold Pulse Width: 0.6 ms
Lead Channel Sensing Intrinsic Amplitude: 3.4 mV
Lead Channel Sensing Intrinsic Amplitude: 7.7 mV
Lead Channel Setting Pacing Amplitude: 2 V
Lead Channel Setting Pacing Amplitude: 2.5 V
Lead Channel Setting Pacing Amplitude: 2.5 V
Lead Channel Setting Pacing Pulse Width: 0.3 ms
Lead Channel Setting Pacing Pulse Width: 0.6 ms
Lead Channel Setting Sensing Sensitivity: 2 mV
Pulse Gen Model: 3222
Pulse Gen Serial Number: 7688750

## 2023-01-24 NOTE — Progress Notes (Signed)
Remote pacemaker transmission.   

## 2023-01-24 NOTE — Addendum Note (Signed)
Addended by: Geralyn Flash D on: 01/24/2023 10:05 AM   Modules accepted: Level of Service

## 2023-01-25 ENCOUNTER — Ambulatory Visit: Payer: PPO | Admitting: Cardiovascular Disease

## 2023-01-25 ENCOUNTER — Ambulatory Visit (INDEPENDENT_AMBULATORY_CARE_PROVIDER_SITE_OTHER): Payer: PPO | Admitting: Nurse Practitioner

## 2023-01-25 ENCOUNTER — Other Ambulatory Visit: Payer: Self-pay

## 2023-01-25 ENCOUNTER — Other Ambulatory Visit: Payer: Self-pay | Admitting: Cardiology

## 2023-01-25 ENCOUNTER — Encounter: Payer: Self-pay | Admitting: Nurse Practitioner

## 2023-01-25 VITALS — BP 146/80 | HR 71 | Temp 97.5°F | Ht 67.0 in | Wt 189.0 lb

## 2023-01-25 DIAGNOSIS — E1143 Type 2 diabetes mellitus with diabetic autonomic (poly)neuropathy: Secondary | ICD-10-CM

## 2023-01-25 DIAGNOSIS — K219 Gastro-esophageal reflux disease without esophagitis: Secondary | ICD-10-CM | POA: Diagnosis not present

## 2023-01-25 DIAGNOSIS — I10 Essential (primary) hypertension: Secondary | ICD-10-CM

## 2023-01-25 DIAGNOSIS — G43809 Other migraine, not intractable, without status migrainosus: Secondary | ICD-10-CM

## 2023-01-25 DIAGNOSIS — Z79899 Other long term (current) drug therapy: Secondary | ICD-10-CM

## 2023-01-25 DIAGNOSIS — R6889 Other general symptoms and signs: Secondary | ICD-10-CM

## 2023-01-25 DIAGNOSIS — Z950 Presence of cardiac pacemaker: Secondary | ICD-10-CM

## 2023-01-25 DIAGNOSIS — E1169 Type 2 diabetes mellitus with other specified complication: Secondary | ICD-10-CM

## 2023-01-25 DIAGNOSIS — E663 Overweight: Secondary | ICD-10-CM

## 2023-01-25 DIAGNOSIS — I428 Other cardiomyopathies: Secondary | ICD-10-CM

## 2023-01-25 DIAGNOSIS — E1122 Type 2 diabetes mellitus with diabetic chronic kidney disease: Secondary | ICD-10-CM | POA: Diagnosis not present

## 2023-01-25 DIAGNOSIS — I5189 Other ill-defined heart diseases: Secondary | ICD-10-CM

## 2023-01-25 DIAGNOSIS — I5022 Chronic systolic (congestive) heart failure: Secondary | ICD-10-CM | POA: Diagnosis not present

## 2023-01-25 DIAGNOSIS — F411 Generalized anxiety disorder: Secondary | ICD-10-CM

## 2023-01-25 DIAGNOSIS — Z1152 Encounter for screening for COVID-19: Secondary | ICD-10-CM | POA: Diagnosis not present

## 2023-01-25 DIAGNOSIS — G4733 Obstructive sleep apnea (adult) (pediatric): Secondary | ICD-10-CM | POA: Diagnosis not present

## 2023-01-25 DIAGNOSIS — N182 Chronic kidney disease, stage 2 (mild): Secondary | ICD-10-CM | POA: Diagnosis not present

## 2023-01-25 DIAGNOSIS — Z01812 Encounter for preprocedural laboratory examination: Secondary | ICD-10-CM

## 2023-01-25 DIAGNOSIS — E785 Hyperlipidemia, unspecified: Secondary | ICD-10-CM | POA: Diagnosis not present

## 2023-01-25 LAB — POCT INFLUENZA A/B
Influenza A, POC: NEGATIVE
Influenza B, POC: NEGATIVE

## 2023-01-25 LAB — POC COVID19 BINAXNOW: SARS Coronavirus 2 Ag: NEGATIVE

## 2023-01-25 MED ORDER — AZITHROMYCIN 250 MG PO TABS: ORAL_TABLET | ORAL | 1 refills | Status: DC

## 2023-01-25 MED ORDER — MOUNJARO 5 MG/0.5ML ~~LOC~~ SOAJ
5.0000 mg | SUBCUTANEOUS | 1 refills | Status: DC
Start: 2023-01-25 — End: 2023-04-13

## 2023-01-25 NOTE — Progress Notes (Unsigned)
Follow Up _________________________________  [name] was seen today for a follow up visit.  Below discusses Plan of Care:  Essential hypertension Continue carvedilol, spironolactone, losartan Discussed DASH (Dietary Approaches to Stop Hypertension) DASH diet is lower in sodium than a typical American diet. Cut back on foods that are high in saturated fat, cholesterol, and trans fats. Eat more whole-grain foods, fish, poultry, and nuts Remain active and exercise as tolerated daily.  Monitor BP at home-Call if greater than 130/80.  Check CMP/CBC  Nonischemic cardiomyopathy [facility]) Follows with Cardiology, Dr Tresa Endo Q6 mo Last seen 04/05/22 Limited echo to reassess pericardial effusion from 08/2021. Continue weight loss, monitor weight at home, follow up cardio  Chronic systolic congestive heart failure (HCC) Follows with Cardiology, Dr Tresa Endo Q 6 mo Last seen 05/2022 Continue weight loss, monitor weight at home, follow up cardio Patient euvolemic  OSA  [name] is no longer on CPAP following weight loss Continue to monitor  CKD 2 associated with T2DM (HCC) Discussed how what you eat and drink can aide in kidney protection. Stay well hydrated. Avoid high salt foods. Avoid NSAIDS. Keep BP and BG well controlled.   Take medications as prescribed. Remain active and exercise as tolerated daily. Maintain weight.  Continue to monitor. 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 Education: Reviewed 'ABCs' of diabetes management  Discussed goals to be met and/or maintained include A1C (<7) Blood pressure (<130/80) Cholesterol (LDL <70) Continue Eye Exam yearly  Continue Dental Exam Q6 mo Discussed dietary recommendations Discussed Physical Activity  recommendations  Hyperlipidemia associated with T2DM (HCC) Continue Rosuvastatin, Fenofibrate Discussed lifestyle modifications. Recommended diet heavy in fruits and veggies, omega 3's. Decrease consumption of animal meats, cheeses, and dairy products. Remain active and exercise as tolerated. Continue to monitor. Check lipids/TSH  Overweight- BMI 29 Excellent progress with 80 lb weight loss this past year Start Phentermine - GLP 1 not covered. Discussed appropriate BMI Diet modification. Physical activity. Encouraged/praised to build confidence.  Other migraine without status migrainosus, not intractable Continue Rizatriptan Avoid triggers Stay well hydrated  Gastroesophageal reflux disease, esophagitis presence not specified Off of meds following weight loss No suspected reflux complications (Barret/stricture). Lifestyle modification:  wt loss, avoid meals 2-3h before bedtime. Consider eliminating food triggers:  chocolate, caffeine, EtOH, acid/spicy food.  Diastolic dysfunction Continue weight loss Cardiology following  Biventricular cardiac pacemaker in situ Followed by Dr. Ladona Ridgel Most recent device check completed on 03/09/2022 showed normal device function, approx 9 months left on battery  Patient has not been seen by EP since 05/2020. Patient is planning to travel out of the country this summer (late July). Recommended that [name] see Dr. Ladona Ridgel or EP APP within the next 3-4 months   Generalized anxiety disorder Continue diazepam PRN, verapamil Reviewed relaxation techniques.  Sleep hygiene. Encouraged personality growth wand development through coping techniques and problem-solving skills. Limit/Decrease/Monitor drug/alcohol intake.    Medication management  URI   Meds ordered this encounter  Medications  . azithromycin (ZITHROMAX) 250 MG tablet    Sig: Take 2 tablets on  Day 1,  followed by 1 tablet  daily for 4 more days    for Sinusitis  /Bronchitis     Dispense:  6 each    Refill:  1    Order Specific Question:  Supervising Provider    Answer:   Lucky Cowboy [phone]   Notify office for further evaluation and treatment, questions or concerns if any reported s/s fail to improve.   The patient was advised to call back or seek an in-person evaluation if any symptoms worsen or if the condition fails to improve as anticipated.   Further disposition pending results of labs. Discussed med's effects and SE's.    I discussed the assessment and treatment plan with the patient. The patient was provided an opportunity to ask questions and all were answered. The patient agreed with the plan and demonstrated an understanding of the instructions.  Discussed med's effects and SE's. Screening labs and tests as requested with regular follow-up as recommended.  I provided 25 minutes of face-to-face time during this encounter including counseling, chart review, and critical decision making was preformed.   Future Appointments  Date Time Provider Department Center  01/25/2023  4:20 PM Lennette Bihari, MD [facility] None  01/26/2023  9:15 AM [facility]  03/07/2023  7:15 AM [facility] DEVICE REMOTES [facility]  03/10/2023 10:40 AM [facility] DIAG TOM[place] 1 [facility]  03/10/2023 10:50 AM [facility] [place] 1 [facility]  04/07/2023  7:15 AM [facility] DEVICE REMOTES [facility]  04/25/2023  2:00 PM Aerionna Moravek, Archie Patten, NP [facility] None  05/09/2023  7:15 AM [facility] DEVICE REMOTES [facility]    HPI 67 y.o. female  presents for a general follow up. 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  [name] is married, 2 children, 5 grandchildren local.  Her husband is a retired Company secretary.  Retired two weeks ago and has been enjoying time at [employer]. 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  [name] has a trip planned out of the country to [place] in June 2024.  [name] has OSA and is on CPAP, lost 80 lb and and no longer uses.  Reports that all sx have resolved, declines referral for retest.   Has history of migraines, infrequent, stress triggers, excedrine migraine, maxalt works well for her. Also on imipramine daily for sleep which helps.   [name] follows with Dr. Nicholas Lose, has had several seb cyst around breast, monitoring for now.   [name] has hx of TAH, follows annually with [facility], [name] is on estrace, down from 5 mg to 1.5 mg, has tried to reduce further. Has had mammogram for this year 02/2022. Saw Nestor Ramp OBGYN for annual exam 01/25/22.  [name] is s/p hysterectomy.  [name] is to continue 1.5 mg estridiol but [name] may increase to 2 mg.  [name] has hx of anxiety, prescribed valium, takes 2.5 mg occasionally, 1-2 days a year, typically around the holidays.   Had lithotripsy by Dr. Cardell Peach 06/2020, no issues since.   BMI is Body mass index is 29.6 kg/m., [name] has been working on diet and exercise. [name] is trying to get down to 160 lb on  home scale, down from peak weight of 240 lb in 08/2018. [name] is interested in restarting GLP-1 but no longer covered.  Has had Rx for Ozempic in the past but did not start.  [name] has been receptive to Phentermine and requests to restart to  continue weight loss journey. Wt Readings from Last 3 Encounters:  01/25/23 189 lb (85.7 kg)  11/18/22 196 lb (88.9 kg)  08/19/22 188 lb 3.2 oz (85.4 kg)   Her blood pressure has been controlled at home, today their BP is BP: (!) 146/80 [name] does workout, does fitness walk program inside.   [name] denies chest pain, shortness of breath, dizziness.   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 Ladona Ridgel and Dr. Tresa Endo.  Last echo was 02/2018 with EF 50-55%. [name] is on BB, ARB, spirolactone, and torsemide. Has follow up in 2 weeks.   [name] has been working on diet and exercise for Diabetes, [name] was off insulin and metformin following 80 lb weight loss.  States that [name] had recent blood work completed for a research study and was found to have A1c of 13.  [name] is requesting to start GLP-1.  [name] does not monitor daily BG checks.  Has had Freestyle Libre in the past and requesting to restart using as [name] will not prick her finger.   With CKD [name] is on ACE/ARB on cozaar 25mg .   With hyperlipidemia is on crestor 40 and fenofibrate   With neuropathy in the past, but improved   With CAD [name] is on bASA  [name] no longer follows up with Dr. Talmage Nap for her DM   [name] see's Dr. Hazle Quant for eye exams 01/2020 normal, has scheduled 04/2022  denies hypoglycemia , polydipsia, polyuria and visual disturbances.  Last A1C was:  Lab Results  Component Value Date   HGBA1C 12.1 (H) 04/21/2022   Lab Results  Component Value Date   GFRNONAA 71 09/04/2020   Lab Results  Component Value Date   CHOL 102 04/21/2022   HDL 36 (L) 04/21/2022   LDLCALC 42 04/21/2022   TRIG 166 (H) 04/21/2022   CHOLHDL 2.8 04/21/2022   Patient is on Vitamin D supplement Lab Results  Component Value Date   VD25OH 74 04/21/2022   Not currently on supplement; receptive to starting SL- Lab Results  Component Value Date   VITAMINB12 492 04/21/2022      Current  Medications:  Current Outpatient Medications on File Prior to Visit  Medication Sig Dispense Refill  . aspirin 81 MG tablet Take 81 mg by mouth at bedtime.    Marland Kitchen aspirin-acetaminophen-caffeine (EXCEDRIN MIGRAINE) 250-250-65 MG per tablet Take 2 tablets by mouth daily as needed for migraine.    . carvedilol (COREG) 12.5 MG tablet TAKE 1 TABLET BY MOUTH TWICE DAILY 180 tablet 1  . Continuous Glucose Sensor (FREESTYLE LIBRE 3 SENSOR) MISC Place 1 sensor on the skin every 14 days. Use to check glucose continuously 2 each 3  . diazepam (VALIUM) 5 MG tablet Take 1/2 to 1 tablet at Bedtime ONLY if needed 30 tablet 0  . estradiol (ESTRACE) 1 MG tablet Take 1.5 tablets by mouth daily.  4  . fenofibrate micronized (LOFIBRA) 134 MG capsule Take 1 capsule (134 mg total) by mouth daily before breakfast. 30 capsule 11  . imipramine (TOFRANIL) 25 MG tablet TAKE 1-2 TABLETS BY MOUTH AT BEDTIME AS NEEDED FOR SLEEP 180 tablet 0  . loratadine (  CLARITIN) 10 MG tablet Take 10 mg by mouth daily.    . Magnesium 400 MG TABS Take by mouth daily.    . metFORMIN (GLUCOPHAGE XR) 500 MG 24 hr tablet Take 4 tabs daily as tolerated, spread over meals. 360 tablet 3  . rosuvastatin (CRESTOR) 40 MG tablet Take 1 tablet (40 mg total) by mouth daily. 90 tablet 3  . sacubitril-valsartan (ENTRESTO) 24-26 MG Take 1 tablet by mouth 2 (two) times daily. 60 tablet 6  . spironolactone (ALDACTONE) 25 MG tablet TAKE 1 TABLET BY MOUTH TWICE DAILY 180 tablet 3  . valACYclovir (VALTREX) 500 MG tablet TAKE 1 TABLET(500 MG) BY MOUTH DAILY 90 tablet 0  . verapamil (CALAN-SR) 240 MG CR tablet TAKE 1 TABLET(240 MG) BY MOUTH AT BEDTIME 90 tablet 1  . VITAMIN D PO Take 2,000 Units by mouth.    . insulin lispro (HUMALOG) 100 UNIT/ML injection Inject 0.02-0.04 mLs (2-4 Units total) into the skin daily. Administer 15 minutes before or immediately after the largest meal or meal with the greatest postprandial glucose excursion. 10 mL 3   No current  facility-administered medications on file prior to visit.   Health Maintenance:   Immunization History  Administered Date(s) Administered  . Influenza Inj Mdck Quad Pf 01/02/2019  . Influenza Inj Mdck Quad With Preservative 11/22/2016, 12/22/2017, 01/29/2020  . Influenza,inj,Quad PF,6+ Mos 12/06/2017  . Influenza-Unspecified 12/06/2013, 11/22/2014, 12/06/2017, 12/19/2020  . Moderna SARS-COV2 Booster Vaccination 12/27/2020  . PFIZER(Purple Top)SARS-COV-2 Vaccination 05/25/2019, 06/15/2019  . Tdap 03/24/2012    Patient Care Team: Lucky Cowboy, MD as PCP - General (Internal Medicine) Lennette Bihari, MD as PCP - Cardiology (Cardiology) Marinus Maw, MD as PCP - Electrophysiology (Cardiology) Lennette Bihari, MD as Consulting Physician (Cardiology) Fletcher Anon, MD (Inactive) as Consulting Physician (Pediatrics) Bernette Redbird, MD as Consulting Physician (Gastroenterology) Dorisann Frames, MD as Consulting Physician (Endocrinology) Miguel Aschoff, MD (Inactive) as Consulting Physician (Obstetrics and Gynecology) Venancio Poisson, MD as Consulting Physician (Dermatology)   Medical History:  Past Medical History:  Diagnosis Date  . Automatic implantable cardiac defibrillator in situ    [employer] 7122  . CHF (congestive heart failure) (HCC)   . Diabetes mellitus   . Hyperlipidemia   . Hypertension 06/25/2011   Renal dopplers - superior mesenteric artery >50% diameter reduction; R renal artery - normal patency; L proximal renal artery 1-59% reduction (lower end of scale); both kidneys normal in size/symmetry with normal cortex and medulla  . Leg pain 12/09/2010   doppler of R femoral artery - no evidence of dissection, AV fistula, pseudoaneurysm or other vascular abnormalities  . Migraines   . Obesity   . PONV (postoperative nausea and vomiting)   . Sleep apnea    on CPAP - AHI during sleep study was 44  . Type II or unspecified type diabetes mellitus without mention of  complication, not stated as uncontrolled   . Type II or unspecified type diabetes mellitus without mention of complication, not stated as uncontrolled   . Unspecified sleep apnea     Allergies  Allergen Reactions  . Flagyl [Metronidazole] Anaphylaxis  . Penicillins Swelling, Rash and Shortness Of Breath    Has patient had a PCN reaction causing immediate rash, facial/tongue/throat swelling, SOB or lightheadedness with hypotension: No Has patient had a PCN reaction causing severe rash involving mucus membranes or skin necrosis: No Has patient had a PCN reaction that required hospitalization No Has patient had a PCN reaction occurring within  the last 10 years: Yes 3-4 years ago If all of the above answers are "NO", then may proceed with Cephalosporin use.  . Niacin Other (See Comments)  . Miconazole Nitrate Other (See Comments)  . Niacin And Related Other (See Comments) and Cough    flushing  . Yeast-Derived Drug Products Swelling and Rash     SURGICAL HISTORY [name]  has a past surgical history that includes Abdominal hysterectomy; Neck surgery; Pacemaker insertion; Cardiac defibrillator placement; Knee surgery (Right); doppler echocardiography (06/25/2011); Cardiovascular stress test (08/28/2010); Cardiac catheterization (07/15/2006); Biv pacemaker generator change out (N/A, 01/16/2014); Extracorporeal shock wave lithotripsy (Left, 06/09/2020); and Breast biopsy (Left, 2019). FAMILY HISTORY Her family history includes Cancer - Cervical in her mother; Colon cancer in her maternal uncle; Colon cancer (age of onset: 22) in her maternal grandfather; Colon polyps in her mother; Hearing loss in her father; Hypertension in her father; Valvular heart disease in her father. SOCIAL HISTORY [name]  reports that [name] quit smoking about 47 years ago. Her smoking use included cigarettes. [name] has never used smokeless tobacco. [name] reports that [name] does not currently use alcohol. [name] reports that [name] does not use  drugs. Has not had alcohol in 8 months.   Review of Systems  Constitutional:  Negative for malaise/fatigue and weight loss.  HENT:  Negative for hearing loss and tinnitus.   Eyes:  Negative for blurred vision and double vision.  Respiratory:  Negative for cough, sputum production, shortness of breath and wheezing.   Cardiovascular:  Negative for chest pain, palpitations, orthopnea, claudication, leg swelling and PND.  Gastrointestinal:  Negative for abdominal pain, blood in stool, constipation, diarrhea, heartburn, melena, nausea and vomiting.  Genitourinary: Negative.   Musculoskeletal:  Negative for falls, joint pain and myalgias.  Skin:  Negative for rash.  Neurological:  Negative for dizziness, tingling, sensory change, weakness and headaches.  Endo/Heme/Allergies:  Negative for polydipsia.  Psychiatric/Behavioral: Negative.  Negative for depression, memory loss, substance abuse and suicidal ideas. The patient is not nervous/anxious and does not have insomnia.   All other systems reviewed and are negative.   Physical Exam: Estimated body mass index is 29.6 kg/m as calculated from the following:   Height as of this encounter: 5\' 7"  (1.702 m).   Weight as of this encounter: 189 lb (85.7 kg). BP (!) 146/80   Pulse 71   Temp (!) 97.5 F (36.4 C)   Ht 5\' 7"  (1.702 m)   Wt 189 lb (85.7 kg)   SpO2 99%   BMI 29.60 kg/m  General Appearance: Well nourished, in no apparent distress. Eyes: PERRLA, EOMs, conjunctiva no swelling or erythema Sinuses: No Frontal/maxillary tenderness ENT/Mouth: Ext aud canals clear, normal light reflex with TMs without erythema, bulging.  Good dentition. No erythema, swelling, or exudate on post pharynx. Tonsils not swollen or erythematous. Hearing normal.  Neck: Supple, thyroid normal. No bruits Respiratory: Respiratory effort normal, BS equal bilaterally without rales, rhonchi, wheezing or stridor. Cardio: RRR without murmurs, rubs or gallops. Brisk  peripheral pulses without edema, with bilateral hard palpable cords without erythema, warmth, tenderness.  Chest: symmetric, with normal excursions and percussion. Breasts: defer to GYN Abdomen: soft, nontender without organomegaly or mass  Lymphatics: Non tender without lymphadenopathy.  Genitourinary: defer to GYN Musculoskeletal: Full ROM all peripheral extremities,5/5 strength, and normal gait. Skin: Warm, dry without rashes, ecchymosis. [name] has extensive cherry angiomas to chest and abdomen. Neuro: Cranial nerves intact, reflexes equal bilaterally. Normal muscle tone, no cerebellar symptoms. Sensation intact bil  feet to monofilament Psych: Awake and oriented X 3, normal affect, Insight and Judgment appropriate.   Adela Glimpse, NP 11:46 AM [facility]

## 2023-01-25 NOTE — Telephone Encounter (Signed)
Letter at front

## 2023-01-26 ENCOUNTER — Encounter: Payer: Self-pay | Admitting: Nurse Practitioner

## 2023-01-26 ENCOUNTER — Ambulatory Visit: Payer: PPO | Attending: Internal Medicine

## 2023-01-26 DIAGNOSIS — Z950 Presence of cardiac pacemaker: Secondary | ICD-10-CM | POA: Diagnosis not present

## 2023-01-26 DIAGNOSIS — Z01812 Encounter for preprocedural laboratory examination: Secondary | ICD-10-CM

## 2023-01-26 LAB — CBC
Hematocrit: 40.9 % (ref 34.0–46.6)
Hemoglobin: 13.7 g/dL (ref 11.1–15.9)
MCH: 29.3 pg (ref 26.6–33.0)
MCHC: 33.5 g/dL (ref 31.5–35.7)
MCV: 87 fL (ref 79–97)
Platelets: 300 10*3/uL (ref 150–450)
RBC: 4.68 x10E6/uL (ref 3.77–5.28)
RDW: 15.3 % (ref 11.7–15.4)
WBC: 8.7 10*3/uL (ref 3.4–10.8)

## 2023-01-26 LAB — BASIC METABOLIC PANEL
BUN/Creatinine Ratio: 17 (ref 12–28)
BUN: 14 mg/dL (ref 8–27)
CO2: 28 mmol/L (ref 20–29)
Calcium: 9.7 mg/dL (ref 8.7–10.3)
Chloride: 103 mmol/L (ref 96–106)
Creatinine, Ser: 0.81 mg/dL (ref 0.57–1.00)
Glucose: 145 mg/dL — ABNORMAL HIGH (ref 70–99)
Potassium: 4.2 mmol/L (ref 3.5–5.2)
Sodium: 138 mmol/L (ref 134–144)
eGFR: 80 mL/min/{1.73_m2} (ref >59)

## 2023-01-26 LAB — HEMOGLOBIN A1C
Hgb A1c MFr Bld: 8 %{Hb} — ABNORMAL HIGH (ref <5.7)
Mean Plasma Glucose: 183 mg/dL
eAG (mmol/L): 10.1 mmol/L

## 2023-01-26 NOTE — Patient Instructions (Signed)

## 2023-02-08 NOTE — Pre-Procedure Instructions (Signed)
Instructed patient on the following items: Arrival time 0600 Nothing to eat or drink after midnight No meds AM of procedure Responsible person to drive you home and stay with you for 24 hrs Wash with special soap night before and morning of procedure

## 2023-02-09 ENCOUNTER — Encounter (HOSPITAL_COMMUNITY): Payer: Self-pay | Admitting: Internal Medicine

## 2023-02-09 ENCOUNTER — Other Ambulatory Visit: Payer: Self-pay

## 2023-02-09 ENCOUNTER — Ambulatory Visit (HOSPITAL_COMMUNITY)
Admission: RE | Admit: 2023-02-09 | Discharge: 2023-02-09 | Disposition: A | Discharge status: Home / Self Care | Payer: PPO | Attending: Internal Medicine | Admitting: Internal Medicine

## 2023-02-09 ENCOUNTER — Other Ambulatory Visit (HOSPITAL_COMMUNITY): Payer: Self-pay

## 2023-02-09 ENCOUNTER — Encounter (HOSPITAL_COMMUNITY)
Admission: RE | Disposition: A | Discharge status: Home / Self Care | Payer: Self-pay | Source: Home / Self Care | Attending: Internal Medicine

## 2023-02-09 DIAGNOSIS — I5022 Chronic systolic (congestive) heart failure: Secondary | ICD-10-CM | POA: Diagnosis not present

## 2023-02-09 DIAGNOSIS — I428 Other cardiomyopathies: Secondary | ICD-10-CM | POA: Diagnosis not present

## 2023-02-09 DIAGNOSIS — I447 Left bundle-branch block, unspecified: Secondary | ICD-10-CM | POA: Diagnosis not present

## 2023-02-09 DIAGNOSIS — Z45010 Encounter for checking and testing of cardiac pacemaker pulse generator [battery]: Secondary | ICD-10-CM | POA: Diagnosis not present

## 2023-02-09 HISTORY — PX: BIV PACEMAKER GENERATOR CHANGEOUT: EP1198

## 2023-02-09 LAB — GLUCOSE, CAPILLARY: Glucose-Capillary: 145 mg/dL — ABNORMAL HIGH (ref 70–99)

## 2023-02-09 SURGERY — BIV PACEMAKER GENERATOR CHANGEOUT

## 2023-02-09 MED ORDER — VANCOMYCIN HCL IN DEXTROSE 1-5 GM/200ML-% IV SOLN
1000.0000 mg | INTRAVENOUS | Status: AC
  Administered 2023-02-09: 1000 mg via INTRAVENOUS

## 2023-02-09 MED ORDER — CHLORHEXIDINE GLUCONATE 4 % EX SOLN
4.0000 | Freq: Once | CUTANEOUS | Status: DC
  Filled 2023-02-09: qty 60

## 2023-02-09 MED ORDER — SODIUM CHLORIDE 0.9 % IV SOLN
80.0000 mg | INTRAVENOUS | Status: AC
  Administered 2023-02-09 (×2): 80 mg

## 2023-02-09 MED ORDER — POVIDONE-IODINE 10 % EX SWAB
2.0000 | Freq: Once | CUTANEOUS | Status: DC
  Administered 2023-02-09: 2 via TOPICAL

## 2023-02-09 MED ORDER — MIDAZOLAM HCL 5 MG/5ML IJ SOLN
INTRAMUSCULAR | Status: DC | PRN
  Administered 2023-02-09 (×5): 1 mg via INTRAVENOUS

## 2023-02-09 MED ORDER — FENTANYL CITRATE (PF) 100 MCG/2ML IJ SOLN
INTRAMUSCULAR | Status: AC
  Filled 2023-02-09: qty 2

## 2023-02-09 MED ORDER — FENTANYL CITRATE (PF) 100 MCG/2ML IJ SOLN
INTRAMUSCULAR | Status: DC | PRN
  Administered 2023-02-09 (×3): 12.5 ug via INTRAVENOUS
  Administered 2023-02-09: 37.5 ug via INTRAVENOUS
  Administered 2023-02-09 (×2): 12.5 ug via INTRAVENOUS

## 2023-02-09 MED ORDER — KETOROLAC TROMETHAMINE 30 MG/ML IJ SOLN
30.0000 mg | Freq: Once | INTRAMUSCULAR | Status: AC
  Administered 2023-02-09: 30 mg via INTRAVENOUS
  Filled 2023-02-09: qty 1

## 2023-02-09 MED ORDER — ONDANSETRON HCL 4 MG/2ML IJ SOLN
INTRAMUSCULAR | Status: AC
  Filled 2023-02-09: qty 2

## 2023-02-09 MED ORDER — ACETAMINOPHEN 500 MG PO TABS
1000.0000 mg | ORAL_TABLET | Freq: Once | ORAL | Status: AC
  Administered 2023-02-09: 1000 mg via ORAL
  Filled 2023-02-09: qty 2

## 2023-02-09 MED ORDER — SODIUM CHLORIDE 0.9 % IV SOLN: INTRAVENOUS | Status: DC

## 2023-02-09 MED ORDER — TRAMADOL HCL 50 MG PO TABS
50.0000 mg | ORAL_TABLET | Freq: Two times a day (BID) | ORAL | 0 refills | Status: AC | PRN
Start: 2023-02-09 — End: 2023-02-12
  Filled 2023-02-09: qty 12, 3d supply, fill #0

## 2023-02-09 MED ORDER — ONDANSETRON HCL 4 MG/2ML IJ SOLN
INTRAMUSCULAR | Status: DC | PRN
  Administered 2023-02-09: 4 mg via INTRAVENOUS

## 2023-02-09 MED ORDER — LIDOCAINE HCL 1 % IJ SOLN
INTRAMUSCULAR | Status: AC
  Filled 2023-02-09: qty 60

## 2023-02-09 MED ORDER — HEPARIN (PORCINE) IN NACL 1000-0.9 UT/500ML-% IV SOLN
INTRAVENOUS | Status: DC | PRN
  Administered 2023-02-09: 500 mL

## 2023-02-09 MED ORDER — ACETAMINOPHEN 325 MG PO TABS: 325.0000 mg | ORAL_TABLET | ORAL | Status: DC | PRN

## 2023-02-09 MED ORDER — SODIUM CHLORIDE 0.9 % IV SOLN
INTRAVENOUS | Status: AC
  Filled 2023-02-09: qty 2

## 2023-02-09 MED ORDER — VANCOMYCIN HCL IN DEXTROSE 1-5 GM/200ML-% IV SOLN
INTRAVENOUS | Status: AC
  Filled 2023-02-09: qty 200

## 2023-02-09 MED ORDER — MIDAZOLAM HCL 5 MG/5ML IJ SOLN
INTRAMUSCULAR | Status: AC
  Filled 2023-02-09: qty 5

## 2023-02-09 MED ORDER — ONDANSETRON HCL 4 MG/2ML IJ SOLN: 4.0000 mg | Freq: Four times a day (QID) | INTRAMUSCULAR | Status: DC | PRN

## 2023-02-09 MED ORDER — LIDOCAINE HCL (PF) 1 % IJ SOLN
INTRAMUSCULAR | Status: DC | PRN
  Administered 2023-02-09: 60 mL

## 2023-02-09 SURGICAL SUPPLY — 9 items
CABLE SURGICAL S-101-97-12 (CABLE) ×1 IMPLANT
DEVICE DISSECT PLASMABLAD 3.0S (MISCELLANEOUS) IMPLANT
PACEMAKER ALLR CRT-P RF PM3222 (Pacemaker) IMPLANT
PAD DEFIB RADIO PHYSIO CONN (PAD) ×1 IMPLANT
PLASMABLADE 3.0S (MISCELLANEOUS) ×1
POUCH AIGIS-R ANTIBACT PPM (Mesh General) ×1 IMPLANT
POUCH AIGIS-R ANTIBACT PPM MED (Mesh General) IMPLANT
PPM ALLURE CRT-P RF PM3222 (Pacemaker) ×1 IMPLANT
TRAY PACEMAKER INSERTION (PACKS) ×1 IMPLANT

## 2023-02-09 NOTE — Discharge Instructions (Addendum)

## 2023-02-09 NOTE — H&P (Signed)
HPI [name] returns today for followup. She is a very pleasant 67 year old woman with a nonischemic cardiomyopathy, chronic systolic heart failure, and left bundle branch block, who underwent biventricular ICD implantation and had normalization of her left ventricular function, s/p downgrade to a BiV PPM. In the interim she has been well with no chest pain or sob.  her weight is essentially unchanged from 3 years ago. Since I saw her last she has gained 8 lbs. She has been under stress as she retired and then went back to work part time. Allergies       Allergies  Allergen Reactions   Flagyl [Metronidazole] Anaphylaxis   Penicillins Swelling, Rash and Shortness Of Breath      Has patient had a PCN reaction causing immediate rash, facial/tongue/throat swelling, SOB or lightheadedness with hypotension: No Has patient had a PCN reaction causing severe rash involving mucus membranes or skin necrosis: No Has patient had a PCN reaction that required hospitalization No Has patient had a PCN reaction occurring within the last 10 years: Yes 3-4 years ago If all of the above answers are "NO", then may proceed with Cephalosporin use.   Niacin Other (See Comments)   Miconazole Nitrate Other (See Comments)   Niacin And Related Other (See Comments) and Cough      flushing   Yeast-Derived Drug Products Swelling and Rash                Current Outpatient Medications  Medication Sig Dispense Refill   aspirin 81 MG tablet Take 81 mg by mouth at bedtime.       aspirin-acetaminophen-caffeine (EXCEDRIN MIGRAINE) 250-250-65 MG per tablet Take 2 tablets by mouth daily as needed for migraine.       carvedilol (COREG) 12.5 MG tablet TAKE 1 TABLET BY MOUTH TWICE DAILY 180 tablet 1   Continuous Blood Gluc Sensor (FREESTYLE LIBRE 3 SENSOR) MISC Place 1 sensor on the skin every 14 days. Use to check glucose continuously 2 each 3   diazepam (VALIUM) 5 MG tablet Take 1/2 to 1 tablet at Bedtime ONLY  if needed 30 tablet 0   estradiol (ESTRACE) 1 MG tablet Take 1.5 tablets by mouth daily.   4   fenofibrate micronized (LOFIBRA) 134 MG capsule Take 1 capsule (134 mg total) by mouth daily before breakfast. 30 capsule 11   imipramine (TOFRANIL) 25 MG tablet TAKE 1-2 TABLETS BY MOUTH AT BEDTIME AS NEEDED FOR SLEEP 180 tablet 0   insulin lispro (HUMALOG) 100 UNIT/ML injection Inject 0.02-0.04 mLs (2-4 Units total) into the skin daily. Administer 15 minutes before or immediately after the largest meal or meal with the greatest postprandial glucose excursion. 10 mL 3   loratadine (CLARITIN) 10 MG tablet Take 10 mg by mouth daily.       Magnesium 400 MG TABS Take by mouth daily.       metFORMIN (GLUCOPHAGE XR) 500 MG 24 hr tablet Take 4 tabs daily as tolerated, spread over meals. 360 tablet 3   rosuvastatin (CRESTOR) 40 MG tablet Take 1 tablet (40 mg total) by mouth daily. 90 tablet 3   sacubitril-valsartan (ENTRESTO) 24-26 MG Take 1 tablet by mouth 2 (two) times daily. 60 tablet 6   spironolactone (ALDACTONE) 25 MG tablet TAKE 1 TABLET BY MOUTH TWICE DAILY 180 tablet 3   valACYclovir (VALTREX) 500 MG tablet TAKE 1 TABLET(500 MG) BY MOUTH DAILY 90 tablet 0   verapamil (CALAN-SR) 240 MG  CR tablet TAKE 1 TABLET(240 MG) BY MOUTH AT BEDTIME 90 tablet 1   VITAMIN D PO Take 2,000 Units by mouth.          No current facility-administered medications for this visit.              Past Medical History:  Diagnosis Date   Automatic implantable cardiac defibrillator in situ      ST. JUDE MODEL 7122   CHF (congestive heart failure) (HCC)     Diabetes mellitus     Hyperlipidemia     Hypertension 06/25/2011    Renal dopplers - superior mesenteric artery >50% diameter reduction; R renal artery - normal patency; L proximal renal artery 1-59% reduction (lower end of scale); both kidneys normal in size/symmetry with normal cortex and medulla   Leg pain 12/09/2010    doppler of R femoral artery - no evidence of  dissection, AV fistula, pseudoaneurysm or other vascular abnormalities   Migraines     Obesity     PONV (postoperative nausea and vomiting)     Sleep apnea      on CPAP - AHI during sleep study was 44   Type II or unspecified type diabetes mellitus without mention of complication, not stated as uncontrolled     Type II or unspecified type diabetes mellitus without mention of complication, not stated as uncontrolled     Unspecified sleep apnea            ROS:    All systems reviewed and negative except as noted in the HPI.          Past Surgical History:  Procedure Laterality Date   ABDOMINAL HYSTERECTOMY        total -    BIV PACEMAKER GENERATOR CHANGE OUT N/A 01/16/2014    Procedure: BIV PACEMAKER GENERATOR CHANGE OUT;  Surgeon: Marinus Maw, MD;  Location: [facility];  Service: Cardiovascular;  Laterality: N/A;   BREAST BIOPSY Left 2019    x2   CARDIAC CATHETERIZATION   07/15/2006    no significant CAD by cardiac cath, confirmed by intravascular [place] of LAD; non-ischemic cardiomyopathy prob related to uncontrolled hypertension, DM and morbid obesity; moderate pulmonary hypertension; preserved cardiac output and cardiac index   CARDIAC DEFIBRILLATOR PLACEMENT        ICD by Dr. Ladona Ridgel   CARDIOVASCULAR STRESS TEST   08/28/2010    R/P MV - pattern of normal perfusion in all regions, no scintigraphic evidence of inducible myocardial ischemia; no EKG changes; normal perfusion study; pt did experience chesst pain during strudy, resolved spontaneously   DOPPLER ECHOCARDIOGRAPHY   06/25/2011    EF >55%; moderate concentric LV hypertrophy; LA mildly dilated, mild mitral annular calcification;    EXTRACORPOREAL SHOCK WAVE LITHOTRIPSY Left 06/09/2020    Procedure: EXTRACORPOREAL SHOCK WAVE LITHOTRIPSY (ESWL);  Surgeon: Jannifer Hick, MD;  Location: [facility];  Service: Urology;  Laterality: Left;  90 MINS   KNEE SURGERY Right      arthroscopic   NECK SURGERY        PACEMAKER INSERTION                     Family History  Problem Relation Age of Onset   Cancer - Cervical Mother          precancerous cells   Colon polyps Mother     Valvular heart disease Father          late 50s,  died after surgery   Hearing loss Father     Hypertension Father     Colon cancer Maternal Grandfather 14   Colon cancer Maternal Uncle              Social History         Socioeconomic History   Marital status: Married      Spouse name: Not on file   Number of children: Not on file   Years of education: Not on file   Highest education level: Not on file  Occupational History   Not on file  Tobacco Use   Smoking status: Former      Current packs/day: 0.00      Types: Cigarettes      Quit date: 03/09/1975      Years since quitting: 47.7   Smokeless tobacco: Never   Tobacco comments:      light smoker for a few months as a teen   Vaping Use   Vaping status: Never Used  Substance and Sexual Activity   Alcohol use: Not Currently   Drug use: No   Sexual activity: Yes      Partners: Male      Birth control/protection: Post-menopausal  Other Topics Concern   Not on file  Social History Narrative    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    Social Determinants of Health    Financial Resource Strain: Not on file  Food Insecurity: Not on file  Transportation Needs: Not on file  Physical Activity: Not on file  Stress: Not on file  Social Connections: Not on file  Intimate Partner Violence: Not on file        BP 116/66   Pulse 69   Ht 5\' 7"  (1.702 m)   Wt 196 lb (88.9 kg)   SpO2 99%   BMI 30.70 kg/m    Physical Exam:   Well appearing NAD HEENT: Unremarkable Neck:  No JVD, no thyromegally Lymphatics:  No adenopathy Back:  No CVA tenderness Lungs:  Clear HEART:  Regular rate rhythm, no murmurs, no rubs, no clicks Abd:  soft, positive bowel sounds, no organomegally, no rebound, no guarding Ext:  2 plus pulses, no edema, no cyanosis,  no clubbing Skin:  No rashes no nodules Neuro:  CN II through XII intact, motor grossly intact   EKG - nsr with biv pacing   DEVICE  Normal device function.  See PaceArt for details. 1 month from ERI.   Assess/Plan:   Chronic systolic heart failure - she has reduced her dose of meds as she has lost weight and her bp has gone down. She will reduce her coreg to 12.5 bid. 2. Obesity - her weight is stable though she has not lost anymore since I saw her last actually gaining weight. 3. Palpitations - these are much improved.  4. Biv PPM - her St. Jude Biv PPM is working normally. We will schedule gen change out in 2 months. She has reached ERI.   Sharlot Gowda Hays Dunnigan,MD

## 2023-02-09 NOTE — Progress Notes (Signed)
States relief from surgical site pain

## 2023-02-11 ENCOUNTER — Ambulatory Visit: Payer: PPO | Attending: Cardiology

## 2023-02-11 NOTE — Progress Notes (Signed)
Pressure dressing removed. Steri strips in place and C/D/I. Surrounding area soft to palpation. No s+s of hematoma or any unexpected bleeding. Pt to restart ASA 81 per [facility] NP on Sunday 12/8. Educated about wound care and s+s of infection. Device clinic number provided.

## 2023-02-24 ENCOUNTER — Ambulatory Visit: Payer: PPO | Attending: Cardiology

## 2023-02-24 ENCOUNTER — Telehealth: Payer: Self-pay

## 2023-02-24 DIAGNOSIS — I428 Other cardiomyopathies: Secondary | ICD-10-CM | POA: Diagnosis not present

## 2023-02-24 LAB — CUP PACEART INCLINIC DEVICE CHECK
Date Time Interrogation Session: 20241219102823
Implantable Pulse Generator Implant Date: 20241204
Pulse Gen Model: 3222
Pulse Gen Serial Number: 8120146

## 2023-02-24 NOTE — Progress Notes (Signed)
Wound check appointment. Steri-strips removed. Wound without redness or edema. Incision edges approximated, wound well healed. Normal device function. testing WNL Chronic settings. Histogram distribution appropriate for patient and level of activity. No mode switches or high ventricular rates noted. Patient educated about wound care. ROV in 3 months with implanting physician. Autosense enabled and LV output decreased to 2@.6 to prolong generator battery.

## 2023-02-24 NOTE — Telephone Encounter (Signed)
LM informing patient to bring [place] Jude home monitor to device clinic appointment today.

## 2023-02-24 NOTE — Patient Instructions (Signed)

## 2023-03-07 ENCOUNTER — Ambulatory Visit (INDEPENDENT_AMBULATORY_CARE_PROVIDER_SITE_OTHER): Payer: PPO

## 2023-03-07 DIAGNOSIS — I428 Other cardiomyopathies: Secondary | ICD-10-CM | POA: Diagnosis not present

## 2023-03-07 DIAGNOSIS — I447 Left bundle-branch block, unspecified: Secondary | ICD-10-CM

## 2023-03-07 LAB — CUP PACEART REMOTE DEVICE CHECK
Battery Remaining Longevity: 94 mo
Battery Remaining Percentage: 95 %
Battery Voltage: 2.96 V
Brady Statistic AP VP Percent: 1 %
Brady Statistic AP VS Percent: 1 %
Brady Statistic AS VP Percent: 99 %
Brady Statistic AS VS Percent: 1 %
Brady Statistic RA Percent Paced: 1 %
Date Time Interrogation Session: 20241230034714
Implantable Pulse Generator Implant Date: 20241204
Lead Channel Impedance Value: 390 Ohm
Lead Channel Impedance Value: 510 Ohm
Lead Channel Impedance Value: 700 Ohm
Lead Channel Pacing Threshold Amplitude: 0.5 V
Lead Channel Pacing Threshold Amplitude: 1 V
Lead Channel Pacing Threshold Amplitude: 1 V
Lead Channel Pacing Threshold Pulse Width: 0.3 ms
Lead Channel Pacing Threshold Pulse Width: 0.6 ms
Lead Channel Pacing Threshold Pulse Width: 0.6 ms
Lead Channel Sensing Intrinsic Amplitude: 10.3 mV
Lead Channel Sensing Intrinsic Amplitude: 3.2 mV
Lead Channel Setting Pacing Amplitude: 2 V
Lead Channel Setting Pacing Amplitude: 2 V
Lead Channel Setting Pacing Amplitude: 2.5 V
Lead Channel Setting Pacing Pulse Width: 0.3 ms
Lead Channel Setting Pacing Pulse Width: 0.6 ms
Lead Channel Setting Sensing Sensitivity: 0.5 mV
Pulse Gen Model: 3222
Pulse Gen Serial Number: 8120146

## 2023-03-10 ENCOUNTER — Ambulatory Visit
Admission: RE | Admit: 2023-03-10 | Discharge: 2023-03-10 | Disposition: A | Discharge status: Home / Self Care | Payer: PPO | Source: Ambulatory Visit | Attending: Obstetrics | Admitting: Obstetrics

## 2023-03-10 DIAGNOSIS — N63 Unspecified lump in unspecified breast: Secondary | ICD-10-CM

## 2023-03-20 ENCOUNTER — Other Ambulatory Visit: Payer: Self-pay | Admitting: Nurse Practitioner

## 2023-03-20 DIAGNOSIS — I10 Essential (primary) hypertension: Secondary | ICD-10-CM

## 2023-03-21 ENCOUNTER — Other Ambulatory Visit: Payer: Self-pay | Admitting: Nurse Practitioner

## 2023-03-21 DIAGNOSIS — F411 Generalized anxiety disorder: Secondary | ICD-10-CM

## 2023-03-29 ENCOUNTER — Ambulatory Visit: Payer: BC Managed Care – PPO

## 2023-04-07 ENCOUNTER — Ambulatory Visit: Payer: PPO

## 2023-04-07 DIAGNOSIS — I447 Left bundle-branch block, unspecified: Secondary | ICD-10-CM | POA: Diagnosis not present

## 2023-04-07 LAB — CUP PACEART REMOTE DEVICE CHECK
Battery Remaining Longevity: 92 mo
Battery Remaining Percentage: 94 %
Battery Voltage: 3.02 V
Brady Statistic AP VP Percent: 1 %
Brady Statistic AP VS Percent: 1 %
Brady Statistic AS VP Percent: 99 %
Brady Statistic AS VS Percent: 1 %
Brady Statistic RA Percent Paced: 1 %
Date Time Interrogation Session: 20250130020023
Implantable Pulse Generator Implant Date: 20241204
Lead Channel Impedance Value: 390 Ohm
Lead Channel Impedance Value: 530 Ohm
Lead Channel Impedance Value: 710 Ohm
Lead Channel Pacing Threshold Amplitude: 0.5 V
Lead Channel Pacing Threshold Amplitude: 1 V
Lead Channel Pacing Threshold Amplitude: 1 V
Lead Channel Pacing Threshold Pulse Width: 0.3 ms
Lead Channel Pacing Threshold Pulse Width: 0.6 ms
Lead Channel Pacing Threshold Pulse Width: 0.6 ms
Lead Channel Sensing Intrinsic Amplitude: 11.4 mV
Lead Channel Sensing Intrinsic Amplitude: 3.2 mV
Lead Channel Setting Pacing Amplitude: 2 V
Lead Channel Setting Pacing Amplitude: 2 V
Lead Channel Setting Pacing Amplitude: 2.5 V
Lead Channel Setting Pacing Pulse Width: 0.3 ms
Lead Channel Setting Pacing Pulse Width: 0.6 ms
Lead Channel Setting Sensing Sensitivity: 0.5 mV
Pulse Gen Model: 3222
Pulse Gen Serial Number: 8120146

## 2023-04-10 ENCOUNTER — Other Ambulatory Visit: Payer: Self-pay | Admitting: Internal Medicine

## 2023-04-10 DIAGNOSIS — E1122 Type 2 diabetes mellitus with diabetic chronic kidney disease: Secondary | ICD-10-CM

## 2023-04-11 ENCOUNTER — Encounter: Payer: Self-pay | Admitting: Internal Medicine

## 2023-04-12 ENCOUNTER — Other Ambulatory Visit: Payer: Self-pay | Admitting: Nurse Practitioner

## 2023-04-12 DIAGNOSIS — Z79899 Other long term (current) drug therapy: Secondary | ICD-10-CM

## 2023-04-13 ENCOUNTER — Encounter: Payer: PPO | Admitting: Nurse Practitioner

## 2023-04-25 ENCOUNTER — Encounter: Payer: PPO | Admitting: Nurse Practitioner

## 2023-05-05 ENCOUNTER — Encounter: Payer: PPO | Admitting: Nurse Practitioner

## 2023-05-13 NOTE — Progress Notes (Signed)
 Remote pacemaker transmission.

## 2023-05-23 ENCOUNTER — Other Ambulatory Visit: Payer: Self-pay | Admitting: Cardiovascular Disease

## 2023-05-24 ENCOUNTER — Encounter: Payer: PPO | Admitting: Internal Medicine

## 2023-06-08 ENCOUNTER — Ambulatory Visit (INDEPENDENT_AMBULATORY_CARE_PROVIDER_SITE_OTHER)

## 2023-06-08 DIAGNOSIS — I447 Left bundle-branch block, unspecified: Secondary | ICD-10-CM

## 2023-06-09 LAB — CUP PACEART REMOTE DEVICE CHECK
Battery Remaining Longevity: 91 mo
Battery Remaining Percentage: 91 %
Battery Voltage: 3.01 V
Brady Statistic AP VP Percent: 4.2 %
Brady Statistic AP VS Percent: 1 %
Brady Statistic AS VP Percent: 96 %
Brady Statistic AS VS Percent: 1 %
Brady Statistic RA Percent Paced: 4.1 %
Date Time Interrogation Session: 20250402053055
Implantable Pulse Generator Implant Date: 20241204
Lead Channel Impedance Value: 390 Ohm
Lead Channel Impedance Value: 540 Ohm
Lead Channel Impedance Value: 740 Ohm
Lead Channel Pacing Threshold Amplitude: 0.5 V
Lead Channel Pacing Threshold Amplitude: 1 V
Lead Channel Pacing Threshold Amplitude: 1 V
Lead Channel Pacing Threshold Pulse Width: 0.3 ms
Lead Channel Pacing Threshold Pulse Width: 0.6 ms
Lead Channel Pacing Threshold Pulse Width: 0.6 ms
Lead Channel Sensing Intrinsic Amplitude: 3.2 mV
Lead Channel Sensing Intrinsic Amplitude: 8.4 mV
Lead Channel Setting Pacing Amplitude: 2 V
Lead Channel Setting Pacing Amplitude: 2 V
Lead Channel Setting Pacing Amplitude: 2.5 V
Lead Channel Setting Pacing Pulse Width: 0.3 ms
Lead Channel Setting Pacing Pulse Width: 0.6 ms
Lead Channel Setting Sensing Sensitivity: 0.5 mV
Pulse Gen Model: 3222
Pulse Gen Serial Number: 8120146

## 2023-06-10 ENCOUNTER — Encounter: Payer: Self-pay | Admitting: Internal Medicine

## 2023-06-12 ENCOUNTER — Other Ambulatory Visit: Payer: Self-pay | Admitting: Nurse Practitioner

## 2023-06-22 NOTE — Addendum Note (Signed)
 Addended by: Lott Rouleau A on: 06/22/2023 10:04 AM   Modules accepted: Orders

## 2023-06-22 NOTE — Progress Notes (Signed)
 Remote pacemaker transmission.

## 2023-06-23 ENCOUNTER — Encounter: Payer: Self-pay | Admitting: Internal Medicine

## 2023-06-23 ENCOUNTER — Ambulatory Visit: Attending: Internal Medicine | Admitting: Internal Medicine

## 2023-06-23 VITALS — BP 144/86 | HR 73 | Ht 67.0 in | Wt 180.0 lb

## 2023-06-23 DIAGNOSIS — I5022 Chronic systolic (congestive) heart failure: Secondary | ICD-10-CM | POA: Diagnosis not present

## 2023-06-23 DIAGNOSIS — Z950 Presence of cardiac pacemaker: Secondary | ICD-10-CM | POA: Diagnosis not present

## 2023-06-23 DIAGNOSIS — I447 Left bundle-branch block, unspecified: Secondary | ICD-10-CM | POA: Diagnosis not present

## 2023-06-23 LAB — CUP PACEART INCLINIC DEVICE CHECK
Battery Remaining Longevity: 85 mo
Battery Voltage: 3.01 V
Brady Statistic RA Percent Paced: 4.6 %
Brady Statistic RV Percent Paced: 99.83 %
Date Time Interrogation Session: 20250417101629
Implantable Lead Connection Status: 753985
Implantable Lead Connection Status: 753985
Implantable Lead Connection Status: 753985
Implantable Lead Implant Date: 20091016
Implantable Lead Implant Date: 20091016
Implantable Lead Implant Date: 20091016
Implantable Lead Location: 753858
Implantable Lead Location: 753859
Implantable Lead Location: 753860
Implantable Lead Model: 4196
Implantable Lead Model: 7122
Implantable Pulse Generator Implant Date: 20241204
Lead Channel Impedance Value: 400 Ohm
Lead Channel Impedance Value: 537.5 Ohm
Lead Channel Impedance Value: 762.5 Ohm
Lead Channel Pacing Threshold Amplitude: 0.5 V
Lead Channel Pacing Threshold Amplitude: 0.5 V
Lead Channel Pacing Threshold Amplitude: 1 V
Lead Channel Pacing Threshold Amplitude: 1 V
Lead Channel Pacing Threshold Amplitude: 1.5 V
Lead Channel Pacing Threshold Amplitude: 1.5 V
Lead Channel Pacing Threshold Pulse Width: 0.3 ms
Lead Channel Pacing Threshold Pulse Width: 0.3 ms
Lead Channel Pacing Threshold Pulse Width: 0.3 ms
Lead Channel Pacing Threshold Pulse Width: 0.3 ms
Lead Channel Pacing Threshold Pulse Width: 0.6 ms
Lead Channel Pacing Threshold Pulse Width: 0.6 ms
Lead Channel Sensing Intrinsic Amplitude: 3.5 mV
Lead Channel Sensing Intrinsic Amplitude: 7.4 mV
Lead Channel Setting Pacing Amplitude: 2 V
Lead Channel Setting Pacing Amplitude: 2 V
Lead Channel Setting Pacing Amplitude: 2.5 V
Lead Channel Setting Pacing Pulse Width: 0.4 ms
Lead Channel Setting Pacing Pulse Width: 0.6 ms
Lead Channel Setting Sensing Sensitivity: 0.5 mV
Pulse Gen Model: 3222
Pulse Gen Serial Number: 8120146

## 2023-06-23 NOTE — Patient Instructions (Signed)
 Medication Instructions:  Your physician recommends that you continue on your current medications as directed. Please refer to the Current Medication list given to you today.  *If you need a refill on your cardiac medications before your next appointment, please call your pharmacy*  Lab Work: None ordered.  You may go to any [facility] Location for your lab work:  [facility] - [address] ([facility]) - [address] [phone] [address]  [place] - [address]   [place]  - [address]   [place] - [address] - [address] [place] [place]  - [address] ([facility]   If you have labs (blood work) drawn today and your tests are completely normal, you will receive your results only by: [employer] (if you have MyChart)  If you have any lab test that is abnormal or we need to change your treatment, we will call you or send a MyChart message to review the results.  Testing/Procedures: None ordered.  Follow-Up: At [facility], you and your health needs are our priority.  As part of our continuing mission to provide you with exceptional heart care, we have created designated Provider Care Teams.  These Care Teams include your primary Cardiologist (physician) and Advanced Practice Providers (APPs -  Physician Assistants and Nurse Practitioners) who all work together to provide you with the care you need, when you need it.   Your next appointment:   1 year(s)  The format for your next appointment:   In Person  Provider:   Dr Virl Son one of the following Advanced Practice Providers on your designated Care Team:   Francis Dowse, PA-C Casimiro Needle "Mardelle Matte" [place], [place] Earnest Rosier, NP  Note: Remote monitoring is used to monitor your Pacemaker/ ICD from home. This monitoring reduces the number of office visits required to check your  device to one time per year. It allows [place] to keep an eye on the functioning of your device to ensure it is working properly.            Valet parking services will be available as well.

## 2023-06-23 NOTE — Progress Notes (Signed)
 HPI Mrs. Luedke returns today for followup. She is a very pleasant 68 year old woman with a nonischemic cardiomyopathy, chronic systolic heart failure, and left bundle branch block, who underwent biventricular ICD implantation and had normalization of her left ventricular function, s/p downgrade to a BiV PPM. In the interim she has been well with no chest pain or sob.  She underwent ICD gen change out 3 months ago and has done well in the interim. She retired and then went back to work part time.  Allergies  Allergen Reactions   Flagyl [Metronidazole] Anaphylaxis   Penicillins Swelling, Rash and Shortness Of Breath    Has patient had a PCN reaction causing immediate rash, facial/tongue/throat swelling, SOB or lightheadedness with hypotension: No Has patient had a PCN reaction causing severe rash involving mucus membranes or skin necrosis: No Has patient had a PCN reaction that required hospitalization No Has patient had a PCN reaction occurring within the last 10 years: Yes 3-4 years ago If all of the above answers are "NO", then may proceed with Cephalosporin use.   Niacin Other (See Comments)   Miconazole Nitrate Other (See Comments)   Niacin And Related Other (See Comments) and Cough    flushing   Yeast-Derived Drug Products Swelling and Rash     Current Outpatient Medications  Medication Sig Dispense Refill   aspirin 81 MG tablet Take 81 mg by mouth at bedtime.     aspirin-acetaminophen-caffeine (EXCEDRIN MIGRAINE) 250-250-65 MG per tablet Take 2 tablets by mouth daily as needed for migraine.     carvedilol (COREG) 12.5 MG tablet TAKE 1 TABLET BY MOUTH TWICE A DAY 180 tablet 1   Continuous Glucose Sensor (FREESTYLE LIBRE 3 SENSOR) MISC Place 1 sensor on the skin every 14 days. Use to check glucose continuously 2 each 3   diazepam (VALIUM) 5 MG tablet Take 1/2 to 1 tablet at Bedtime ONLY if needed 30 tablet 0   ENTRESTO 24-26 MG TAKE 1 TABLET BY MOUTH TWICE A DAY 60 tablet 6    estradiol (ESTRACE) 1 MG tablet Take 2 mg by mouth daily.  4   fenofibrate micronized (LOFIBRA) 134 MG capsule Take 1 capsule (134 mg total) by mouth daily before breakfast. 30 capsule 11   imipramine (TOFRANIL) 25 MG tablet TAKE 1 TO 2 TABLETS BY MOUTH AT BEDTIME AS NEEDED FOR SLEEP 180 tablet 0   loratadine (CLARITIN) 10 MG tablet Take 10 mg by mouth daily as needed for allergies.     Magnesium 400 MG TABS Take 400 mg by mouth daily.     metFORMIN (GLUCOPHAGE-XR) 500 MG 24 hr tablet TAKE 4 TABLETS BY MOUTH DAILY AS TOLERATED. SPREAD OVER MEALS 360 tablet 3   rosuvastatin (CRESTOR) 40 MG tablet TAKE 1 TABLET BY MOUTH DAILY 90 tablet 3   spironolactone (ALDACTONE) 25 MG tablet TAKE 1 TABLET BY MOUTH TWICE DAILY (Patient taking differently: Take 25 mg by mouth daily.) 180 tablet 3   valACYclovir (VALTREX) 500 MG tablet TAKE 1 TABLET(500 MG) BY MOUTH DAILY 90 tablet 0   verapamil (CALAN-SR) 240 MG CR tablet TAKE 1 TABLET BY MOUTH AT BEDTIME 90 tablet 1   VITAMIN D PO Take 2,000 Units by mouth daily.     tirzepatide [facility]) 5 MG/0.5ML Pen INJECT 5 MG SUBCUTANEOUSLY WEEKLY (Patient not taking: Reported on 06/23/2023) 2 mL 2   No current facility-administered medications for this visit.     Past Medical History:  Diagnosis Date   Automatic implantable  cardiac defibrillator in situ    ST. JUDE MODEL 7122   CHF (congestive heart failure) (HCC)    Diabetes mellitus    Hyperlipidemia    Hypertension 06/25/2011   Renal dopplers - superior mesenteric artery >50% diameter reduction; R renal artery - normal patency; L proximal renal artery 1-59% reduction (lower end of scale); both kidneys normal in size/symmetry with normal cortex and medulla   Leg pain 12/09/2010   doppler of R femoral artery - no evidence of dissection, AV fistula, pseudoaneurysm or other vascular abnormalities   Migraines    Obesity    PONV (postoperative nausea and vomiting)    Sleep apnea    on CPAP - AHI during sleep study  was 44   Type II or unspecified type diabetes mellitus without mention of complication, not stated as uncontrolled    Type II or unspecified type diabetes mellitus without mention of complication, not stated as uncontrolled    Unspecified sleep apnea     ROS:   All systems reviewed and negative except as noted in the HPI.   Past Surgical History:  Procedure Laterality Date   ABDOMINAL HYSTERECTOMY     total -    BIV PACEMAKER GENERATOR CHANGE OUT N/A 01/16/2014   Procedure: BIV PACEMAKER GENERATOR CHANGE OUT;  Surgeon: Marinus Maw, MD;  Location: [facility] CATH LAB;  Service: Cardiovascular;  Laterality: N/A;   BIV PACEMAKER GENERATOR CHANGEOUT N/A 02/09/2023   Procedure: BIV PACEMAKER GENERATOR CHANGEOUT;  Surgeon: Marinus Maw, MD;  Location: [facility] INVASIVE CV LAB;  Service: Cardiovascular;  Laterality: N/A;   BREAST BIOPSY Left 2019   x2   CARDIAC CATHETERIZATION  07/15/2006   no significant CAD by cardiac cath, confirmed by intravascular [place] of LAD; non-ischemic cardiomyopathy prob related to uncontrolled hypertension, DM and morbid obesity; moderate pulmonary hypertension; preserved cardiac output and cardiac index   CARDIAC DEFIBRILLATOR PLACEMENT     ICD by Dr. Ladona Ridgel   CARDIOVASCULAR STRESS TEST  08/28/2010   R/P MV - pattern of normal perfusion in all regions, no scintigraphic evidence of inducible myocardial ischemia; no EKG changes; normal perfusion study; pt did experience chesst pain during strudy, resolved spontaneously   DOPPLER ECHOCARDIOGRAPHY  06/25/2011   EF >55%; moderate concentric LV hypertrophy; LA mildly dilated, mild mitral annular calcification;    EXTRACORPOREAL SHOCK WAVE LITHOTRIPSY Left 06/09/2020   Procedure: EXTRACORPOREAL SHOCK WAVE LITHOTRIPSY (ESWL);  Surgeon: Jannifer Hick, MD;  Location: [facility];  Service: Urology;  Laterality: Left;  90 MINS   KNEE SURGERY Right    arthroscopic   NECK SURGERY     PACEMAKER INSERTION        Family History  Problem Relation Age of Onset   Cancer - Cervical Mother        precancerous cells   Colon polyps Mother    Valvular heart disease Father        late 18s, died after surgery   Hearing loss Father    Hypertension Father    Colon cancer Maternal Grandfather 12   Colon cancer Maternal Uncle      Social History   Socioeconomic History   Marital status: Married    Spouse name: Not on file   Number of children: Not on file   Years of education: Not on file   Highest education level: Not on file  Occupational History   Not on file  Tobacco Use   Smoking status: Former    Current packs/day:  0.00    Types: Cigarettes    Quit date: 03/09/1975    Years since quitting: 48.3   Smokeless tobacco: Never   Tobacco comments:    light smoker for a few months as a teen   Vaping Use   Vaping status: Never Used  Substance and Sexual Activity   Alcohol use: Not Currently   Drug use: No   Sexual activity: Yes    Partners: Male    Birth control/protection: Post-menopausal  Other Topics Concern   Not on file  Social History Narrative   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   Social Drivers of Corporate investment banker Strain: Not on file  Food Insecurity: Not on file  Transportation Needs: Not on file  Physical Activity: Not on file  Stress: Not on file  Social Connections: Not on file  Intimate Partner Violence: Not on file     BP (!) 144/86   Pulse 73   Ht 5\' 7"  (1.702 m)   Wt 180 lb (81.6 kg)   SpO2 98%   BMI 28.19 kg/m   Physical Exam:  Well appearing NAD HEENT: Unremarkable Neck:  No JVD, no thyromegally Lymphatics:  No adenopathy Back:  No CVA tenderness Lungs:  Clear HEART:  Regular rate rhythm, no murmurs, no rubs, no clicks Abd:  soft, positive bowel sounds, no organomegally, no rebound, no guarding Ext:  2 plus pulses, no edema, no cyanosis, no clubbing Skin:  No rashes no nodules Neuro:  CN II through XII intact, motor grossly  intact  EKG  DEVICE  Normal device function.  See PaceArt for details.   Assess/Plan:  Chronic systolic heart failure - she has done well and will continue her current meds.  Obesity - her weight is stable though she has not lost anymore since I saw her last actually gaining weight. 3. Palpitations - these are much improved.  4. Biv PPM - her St. Jude Biv PPM is working normally.    Pete Brand Tanza Pellot,MD

## 2023-06-28 ENCOUNTER — Ambulatory Visit: Payer: BC Managed Care – PPO

## 2023-07-13 ENCOUNTER — Other Ambulatory Visit: Payer: Self-pay

## 2023-07-13 MED ORDER — FENOFIBRATE 134 MG PO CAPS: 134.0000 mg | ORAL_CAPSULE | Freq: Every day | ORAL | 0 refills | Status: DC

## 2023-07-22 ENCOUNTER — Encounter: Payer: Self-pay | Admitting: Cardiology

## 2023-07-22 ENCOUNTER — Ambulatory Visit: Attending: Cardiology | Admitting: Cardiology

## 2023-07-22 VITALS — BP 132/79 | HR 67 | Ht 67.0 in | Wt 189.0 lb

## 2023-07-22 DIAGNOSIS — E1165 Type 2 diabetes mellitus with hyperglycemia: Secondary | ICD-10-CM

## 2023-07-22 DIAGNOSIS — Z950 Presence of cardiac pacemaker: Secondary | ICD-10-CM

## 2023-07-22 DIAGNOSIS — I5022 Chronic systolic (congestive) heart failure: Secondary | ICD-10-CM

## 2023-07-22 DIAGNOSIS — Z794 Long term (current) use of insulin: Secondary | ICD-10-CM

## 2023-07-22 DIAGNOSIS — I428 Other cardiomyopathies: Secondary | ICD-10-CM

## 2023-07-22 DIAGNOSIS — I10 Essential (primary) hypertension: Secondary | ICD-10-CM | POA: Diagnosis not present

## 2023-07-22 MED ORDER — FREESTYLE LIBRE 3 SENSOR MISC
1 refills | Status: AC
Start: 2023-07-22 — End: ?

## 2023-07-22 MED ORDER — ENTRESTO 49-51 MG PO TABS: 1.0000 | ORAL_TABLET | Freq: Two times a day (BID) | ORAL | 11 refills | Status: AC

## 2023-07-22 NOTE — Progress Notes (Signed)
 Cardiology Office Note:  .   Date:  07/22/2023  ID:  [name], DOB 20-Jan-1956, MRN [id number] PCP: Langley Pippin, NP  [facility] Providers Cardiologist:  Knox Perl, MD   History of Present Illness: .   [name] is a 68 y.o. Caucasian female patient with nonischemic cardiomyopathy with severe LV dysfunction, LBBB SP BiV pacemaker implantation in 2015 now with recovered LVEF to 45 to 50%, hypertension, hypercholesterolemia, diabetes mellitus and OSA on CPAP presents here for annual visit.  She remains asymptomatic.  As she is without a PCP, she has stopped using Mounjaro  and also monitoring her blood sugar as she did not have a prescription.  However she has now established with Langley Pippin, NP.  Patient is new to me, previously being followed by Dr. Magnus Schuller.  Discussed the use of AI scribe software for clinical note transcription with the patient, who gave verbal consent to proceed.  History of Present Illness [name] is a 68 year old female with diabetes and heart failure who presents for follow-up of nonischemic cardiomyopathy and hypertension.  Her diabetes management has been challenging due to the passing of her primary care physician, leaving her without prescriptions and a glucose monitor. She has been without proper diabetes management for about three months, leading to uncontrolled blood sugar levels, with recent readings over 400 mg/dL. She was previously on Mounjaro  but has been off it for two to three months. She is currently taking regular insulin  and metformin .  She has heart failure and had her pacemaker generator replaced in December 2024. She is on Entresto , carvedilol , and verapamil . She takes two baby aspirins daily, though she has no history of coronary artery disease.  She reports limited activity due to work and weight gain, attributed to lack of glucose control and discontinuation of Mounjaro . No shortness of breath, and she can  perform her desired activities, though her activity is limited by work demands.  Her last pacemaker transmission in April showed no episodes of ventricular tachycardia or atrial fibrillation.  Labs   Lab Results  Component Value Date   CHOL 102 04/21/2022   HDL 36 (L) 04/21/2022   LDLCALC 42 04/21/2022   TRIG 166 (H) 04/21/2022   CHOLHDL 2.8 04/21/2022   Lab Results  Component Value Date   NA 138 01/26/2023   K 4.2 01/26/2023   CO2 28 01/26/2023   GLUCOSE 145 (H) 01/26/2023   BUN 14 01/26/2023   CREATININE 0.81 01/26/2023   CALCIUM  9.7 01/26/2023   GFR 65.08 01/03/2014   EGFR 80 01/26/2023   GFRNONAA 71 09/04/2020      Latest Ref Rng & Units 01/26/2023    9:27 AM 05/25/2022    2:44 PM 04/21/2022    2:57 PM  BMP  Glucose 70 - 99 mg/dL 782  956  213   BUN 8 - 27 mg/dL 14  12  14    Creatinine 0.57 - 1.00 mg/dL 0.86  5.78  4.69   BUN/Creat Ratio 12 - 28 17  13   SEE NOTE:   Sodium 134 - 144 mmol/L 138  142  135   Potassium 3.5 - 5.2 mmol/L 4.2  4.2  4.5   Chloride 96 - 106 mmol/L 103  100  97   CO2 20 - 29 mmol/L 28  23  26    Calcium  8.7 - 10.3 mg/dL 9.7  9.6  62.9       Latest Ref Rng & Units 01/26/2023  9:27 AM 04/21/2022    2:57 PM 06/17/2021    5:04 PM  CBC  WBC 3.4 - 10.8 x10E3/uL 8.7  8.3  8.2   Hemoglobin 11.1 - 15.9 g/dL 45.8  09.9  83.3   Hematocrit 34.0 - 46.6 % 40.9  42.3  39.6   Platelets 150 - 450 x10E3/uL 300  315  299    Lab Results  Component Value Date   HGBA1C 8.0 (H) 01/25/2023    Lab Results  Component Value Date   TSH 1.41 04/21/2022     ROS  Review of Systems  Cardiovascular:  Negative for chest pain, dyspnea on exertion and leg swelling.   Physical Exam:   VS:  BP 132/79 (BP Location: Left Arm, Patient Position: Sitting, Cuff Size: Normal)   Pulse 67   Ht 5\' 7"  (1.702 m)   Wt 189 lb (85.7 kg)   SpO2 98%   BMI 29.60 kg/m    Wt Readings from Last 3 Encounters:  07/22/23 189 lb (85.7 kg)  06/23/23 180 lb (81.6 kg)  02/09/23  182 lb (82.6 kg)    Physical Exam Neck:     Vascular: No carotid bruit or JVD.  Cardiovascular:     Rate and Rhythm: Normal rate and regular rhythm.     Pulses: Intact distal pulses.     Heart sounds: Normal heart sounds. No murmur heard.    No gallop.  Pulmonary:     Effort: Pulmonary effort is normal.     Breath sounds: Normal breath sounds.  Abdominal:     General: Bowel sounds are normal.     Palpations: Abdomen is soft.  Musculoskeletal:     Right lower leg: No edema.     Left lower leg: No edema.    Studies Reviewed: Aaron Aas    ECHOCARDIOGRAM LIMITED 04/29/2022 1. Moderate pericardial effusion seen best on parasternal images; not significantly changed compared to 09/04/21. 2. Left ventricular ejection fraction, by estimation, is 45 to 50%. The left ventricle has mildly decreased function. The left ventricle demonstrates global hypokinesis. Left ventricular diastolic parameters were normal. 3. Right ventricular systolic function is normal. The right ventricular size is normal. 4. Moderate pericardial effusion. 5. Trivial mitral valve regurgitation. 6. The aortic valve is tricuspid. Aortic valve regurgitation is trivial. Aortic valve sclerosis is present, with no evidence of aortic valve stenosis. EKG:         Medications and allergies    Allergies  Allergen Reactions   Flagyl [Metronidazole] Anaphylaxis   Penicillins Swelling, Rash and Shortness Of Breath    Has patient had a PCN reaction causing immediate rash, facial/tongue/throat swelling, SOB or lightheadedness with hypotension: No Has patient had a PCN reaction causing severe rash involving mucus membranes or skin necrosis: No Has patient had a PCN reaction that required hospitalization No Has patient had a PCN reaction occurring within the last 10 years: Yes 3-4 years ago If all of the above answers are "NO", then may proceed with Cephalosporin use.   Niacin Other (See Comments)   Miconazole Nitrate Other (See  Comments)   Niacin And Related Other (See Comments) and Cough    flushing   Yeast-Derived Drug Products Swelling and Rash     Current Outpatient Medications:    aspirin -acetaminophen -caffeine (EXCEDRIN MIGRAINE) 250-250-65 MG per tablet, Take 2 tablets by mouth daily as needed for migraine., Disp: , Rfl:    carvedilol  (COREG ) 12.5 MG tablet, TAKE 1 TABLET BY MOUTH TWICE A DAY, Disp: 180 tablet, Rfl:  1   diazepam  (VALIUM ) 5 MG tablet, Take 1/2 to 1 tablet at Bedtime ONLY if needed, Disp: 30 tablet, Rfl: 0   estradiol (ESTRACE) 1 MG tablet, Take 2 mg by mouth daily., Disp: , Rfl: 4   fenofibrate  micronized (LOFIBRA) 134 MG capsule, Take 1 capsule (134 mg total) by mouth daily before breakfast., Disp: 30 capsule, Rfl: 0   imipramine  (TOFRANIL ) 25 MG tablet, TAKE 1 TO 2 TABLETS BY MOUTH AT BEDTIME AS NEEDED FOR SLEEP, Disp: 180 tablet, Rfl: 0   loratadine (CLARITIN) 10 MG tablet, Take 10 mg by mouth daily as needed for allergies., Disp: , Rfl:    Magnesium 400 MG TABS, Take 400 mg by mouth daily., Disp: , Rfl:    metFORMIN  (GLUCOPHAGE -XR) 500 MG 24 hr tablet, TAKE 4 TABLETS BY MOUTH DAILY AS TOLERATED. SPREAD OVER MEALS, Disp: 360 tablet, Rfl: 3   rosuvastatin  (CRESTOR ) 40 MG tablet, TAKE 1 TABLET BY MOUTH DAILY, Disp: 90 tablet, Rfl: 3   sacubitril -valsartan  (ENTRESTO ) 49-51 MG, Take 1 tablet by mouth 2 (two) times daily., Disp: 60 tablet, Rfl: 11   spironolactone  (ALDACTONE ) 25 MG tablet, TAKE 1 TABLET BY MOUTH TWICE DAILY (Patient taking differently: Take 25 mg by mouth daily.), Disp: 180 tablet, Rfl: 3   valACYclovir  (VALTREX ) 500 MG tablet, TAKE 1 TABLET(500 MG) BY MOUTH DAILY, Disp: 90 tablet, Rfl: 0   verapamil  (CALAN -SR) 240 MG CR tablet, TAKE 1 TABLET BY MOUTH AT BEDTIME, Disp: 90 tablet, Rfl: 1   VITAMIN D  PO, Take 2,000 Units by mouth daily., Disp: , Rfl:    Continuous Glucose Sensor (FREESTYLE LIBRE 3 SENSOR) MISC, Place 1 sensor on the skin every 14 days. Use to check glucose  continuously, Disp: 2 each, Rfl: 1   tirzepatide  (MOUNJARO ) 5 MG/0.5ML Pen, INJECT 5 MG SUBCUTANEOUSLY WEEKLY (Patient not taking: Reported on 07/22/2023), Disp: 2 mL, Rfl: 2   Meds ordered this encounter  Medications   Continuous Glucose Sensor (FREESTYLE LIBRE 3 SENSOR) MISC    Sig: Place 1 sensor on the skin every 14 days. Use to check glucose continuously    Dispense:  2 each    Refill:  1   sacubitril -valsartan  (ENTRESTO ) 49-51 MG    Sig: Take 1 tablet by mouth 2 (two) times daily.    Dispense:  60 tablet    Refill:  11    Dose change (increased 07/22/23)     Medications Discontinued During This Encounter  Medication Reason   aspirin  81 MG tablet Discontinued by provider   Continuous Glucose Sensor (FREESTYLE LIBRE 3 SENSOR) MISC Reorder   ENTRESTO  24-26 MG Dose change     ASSESSMENT AND PLAN: .      ICD-10-CM   1. Chronic heart failure with mildly reduced ejection fraction (HFmrEF, 41-49%) (HCC)  I50.22 sacubitril -valsartan  (ENTRESTO ) 49-51 MG    2. Nonischemic cardiomyopathy (HCC)  I42.8     3. Essential hypertension  I10     4. Pacemaker  Z95.0     5. Type 2 diabetes mellitus with hyperglycemia, with long-term current use of insulin  (HCC)  E11.65 Continuous Glucose Sensor (FREESTYLE LIBRE 3 SENSOR) MISC   Z79.4     6. Medication management  Z79.899       Assessment and Plan Assessment & Plan Heart Failure with Mildly Reduced Ejection Fraction   Her heart failure with reduced ejection fraction has improved from 20-25% to a low normal range with current treatment. She is well-managed on Entresto  and carvedilol , with no  recent episodes of ventricular tachycardia or atrial fibrillation. Increasing Entresto  to the middle dose of 49/51 mg is expected to further improve heart function, as research indicates most benefits start at this dose.  Continue carvedilol  12.5 mg. Discontinue aspirin  as there is no indication for its use without coronary artery disease. Repeat  echocardiogram in one year to monitor heart function. She is on Entresto , spironolactone , carvedilol  and in view of diabetes mellitus, she would be a very strong candidate for [place] or Farxiga.  Presence of Cardiac Pacemaker   Her cardiac pacemaker, with a recent generator replacement, is functioning well with 99% biventricular pacing and an estimated longevity of 7.4 to 7.8 years. No fluid overload state or irregular heart rhythms are detected. Continue regular pacemaker monitoring.  Type 2 Diabetes Mellitus with Hyperglycemia   Her type 2 diabetes mellitus with hyperglycemia is currently uncontrolled due to lack of medication and glucose monitoring. Blood glucose levels have been significantly elevated, with a recent manual check showing glucose over 400 mg/dL. She has been off Mounjaro  and without a glucose monitor due to the passing of her primary care physician and subsequent lack of prescriptions. She is currently taking regular insulin , 2-4 units twice a day, and metformin . Reorder Freestyle Libre Plus for glucose monitoring. Coordinate with primary care provider for Mounjaro  prescription renewal. Continue insulin  as needed, 2-4 units twice a day. Follow up with primary care provider in 3-4 weeks for further diabetes management.  As she has remained stable and there is no clinical evidence of heart failure, we will continue present medical management.  She is also on verapamil  for hypertension which I did not discontinue as her EF has remained stable and she is on GDP with increasing Entresto  dose today.  Will check echocardiogram prior to her next office visit in a year.   Signed,  Knox Perl, MD, [facility] 07/22/2023, 9:09 PM [facility] [address] [place], [place] [address] Phone: [phone]. Fax:  [phone]

## 2023-07-22 NOTE — Patient Instructions (Signed)
 Medication Instructions:  Your physician has recommended you make the following change in your medication:   1) INCREASE Entresto  to 49-51 mg twice daily  *If you need a refill on your cardiac medications before your next appointment, please call your pharmacy*  Follow-Up: At [facility], you and your health needs are our priority.  As part of our continuing mission to provide you with exceptional heart care, our providers are all part of one team.  This team includes your primary Cardiologist (physician) and Advanced Practice Providers or APPs (Physician Assistants and Nurse Practitioners) who all work together to provide you with the care you need, when you need it.  Your next appointment:   1 year(s)  The format for your next appointment:   In Person  Provider:   Knox Perl, MD{  We recommend signing up for the patient portal called "MyChart".  Sign up information is provided on this After Visit Summary.  MyChart is used to connect with patients for Virtual Visits (Telemedicine).  Patients are able to view lab/test results, encounter notes, upcoming appointments, etc.  Non-urgent messages can be sent to your provider as well.   To learn more about what you can do with MyChart, go to [email or web address].

## 2023-07-26 ENCOUNTER — Encounter: Payer: Self-pay | Admitting: Cardiology

## 2023-08-18 ENCOUNTER — Other Ambulatory Visit: Payer: Self-pay | Admitting: Nurse Practitioner

## 2023-08-24 ENCOUNTER — Other Ambulatory Visit (HOSPITAL_COMMUNITY): Payer: Self-pay | Admitting: Orthopedic Surgery

## 2023-08-24 ENCOUNTER — Telehealth: Payer: Self-pay | Admitting: Cardiology

## 2023-08-24 DIAGNOSIS — M79641 Pain in right hand: Secondary | ICD-10-CM

## 2023-08-24 NOTE — Telephone Encounter (Signed)
 Patient called to advise her system is NOT MRI compatible. Pt was appreciative of call back.

## 2023-08-24 NOTE — Telephone Encounter (Signed)
 Spoke with patient and she states she needs MRI on her hand and wanted to know if her pacemaker is compatible

## 2023-08-24 NOTE — Telephone Encounter (Signed)
 Patient is calling to speak to he nurse about having a MRI done. Please advise

## 2023-09-05 ENCOUNTER — Encounter: Payer: Self-pay | Admitting: Cardiology

## 2023-09-05 ENCOUNTER — Telehealth: Payer: Self-pay

## 2023-09-05 NOTE — Telephone Encounter (Signed)
 Dr. Ganji  We have received a surgical clearance request for [name] for ligament reconstruction of index finger. They were seen recently in clinic on 07/22/2023. Can you please comment on surgical clearance for her upcoming procedure. Please forward you guidance and recommendations to P CV DIV PREOP   Jackee Alberts, NP

## 2023-09-05 NOTE — Telephone Encounter (Signed)
   Pre-operative Risk Assessment    Patient Name: [name]  DOB: [birth year] MRN: [id number]   Date of last office visit: 07/22/23 GORDY BERGAMO, MD Date of next office visit: NONE   Request for Surgical Clearance    Procedure:  REPAIR VERSUS RECONSTRUCTION OF RING INDEX FINGER RADIAL COLLATERAL LIGAMENT  Date of Surgery:  Clearance TBD                                Surgeon:  DR BEBE GALLA Surgeon's Group or Practice Name:  [facility] Phone number:  [phone] Fax number:  [phone]  ATTN: JOEN WILLS   Type of Clearance Requested:   - Medical    Type of Anesthesia:  REGIONAL + MAC   Additional requests/questions:    Signed, Lucie DELENA Ku   09/05/2023, 1:10 PM

## 2023-09-07 ENCOUNTER — Ambulatory Visit (INDEPENDENT_AMBULATORY_CARE_PROVIDER_SITE_OTHER)

## 2023-09-07 DIAGNOSIS — I447 Left bundle-branch block, unspecified: Secondary | ICD-10-CM

## 2023-09-08 LAB — CUP PACEART REMOTE DEVICE CHECK
Battery Remaining Longevity: 85 mo
Battery Remaining Percentage: 88 %
Battery Voltage: 3.01 V
Brady Statistic AP VP Percent: 14 %
Brady Statistic AP VS Percent: 1 %
Brady Statistic AS VP Percent: 86 %
Brady Statistic AS VS Percent: 1 %
Brady Statistic RA Percent Paced: 13 %
Date Time Interrogation Session: 20250702040015
Implantable Lead Connection Status: 753985
Implantable Lead Connection Status: 753985
Implantable Lead Connection Status: 753985
Implantable Lead Implant Date: 20091016
Implantable Lead Implant Date: 20091016
Implantable Lead Implant Date: 20091016
Implantable Lead Location: 753858
Implantable Lead Location: 753859
Implantable Lead Location: 753860
Implantable Lead Model: 4196
Implantable Lead Model: 7122
Implantable Pulse Generator Implant Date: 20241204
Lead Channel Impedance Value: 400 Ohm
Lead Channel Impedance Value: 510 Ohm
Lead Channel Impedance Value: 750 Ohm
Lead Channel Pacing Threshold Amplitude: 0.5 V
Lead Channel Pacing Threshold Amplitude: 1 V
Lead Channel Pacing Threshold Amplitude: 1.5 V
Lead Channel Pacing Threshold Pulse Width: 0.3 ms
Lead Channel Pacing Threshold Pulse Width: 0.3 ms
Lead Channel Pacing Threshold Pulse Width: 0.6 ms
Lead Channel Sensing Intrinsic Amplitude: 2.8 mV
Lead Channel Sensing Intrinsic Amplitude: 8.1 mV
Lead Channel Setting Pacing Amplitude: 2 V
Lead Channel Setting Pacing Amplitude: 2 V
Lead Channel Setting Pacing Amplitude: 2.5 V
Lead Channel Setting Pacing Pulse Width: 0.4 ms
Lead Channel Setting Pacing Pulse Width: 0.6 ms
Lead Channel Setting Sensing Sensitivity: 0.5 mV
Pulse Gen Model: 3222
Pulse Gen Serial Number: 8120146

## 2023-09-14 ENCOUNTER — Ambulatory Visit: Payer: Self-pay | Admitting: Internal Medicine

## 2023-09-27 ENCOUNTER — Ambulatory Visit: Payer: BC Managed Care – PPO

## 2023-09-27 NOTE — Telephone Encounter (Signed)
 Low risk procedure, I am okay with this

## 2023-09-28 ENCOUNTER — Encounter: Payer: Self-pay | Admitting: Internal Medicine

## 2023-09-28 NOTE — Telephone Encounter (Signed)
 Not sure if its indicated, but please review for device clearance.

## 2023-09-28 NOTE — Telephone Encounter (Signed)
   Primary Cardiologist: Gordy Bergamo, MD  Chart reviewed as part of pre-operative protocol coverage. Given past medical history and time since last visit, based on ACC/AHA guidelines, Catelyn S Procell would be at acceptable risk for the planned procedure without further cardiovascular testing.   Patient should contact our office if she is having new symptoms that are concerning from a cardiac perspective to arrange a follow-up appointment.    I will route this recommendation to the requesting party via Epic fax function and remove from pre-op  pool.  Please call with questions.  Rosaline EMERSON Bane, NP-C 09/28/2023, 7:40 AM [address], [address] [place], [place] [address] Office [phone] Fax [phone]

## 2023-09-28 NOTE — Progress Notes (Signed)
 PERIOPERATIVE PRESCRIPTION FOR IMPLANTED CARDIAC DEVICE PROGRAMMING   Patient Information:  Patient: [name]  MRN: [id number]  Date of Birth: 1955-12-01   Procedure:  REPAIR VERSUS RECONSTRUCTION OF RING INDEX FINGER RADIAL COLLATERAL LIGAMENT   Date of Surgery:  Clearance TBD                                  Surgeon:  DR BEBE GALLA Surgeon's Group or Practice Name:  [facility] Phone number:  [phone]   Device Information:   Clinic EP Physician:   Dr. Danelle Birmingham Device Type:  Pacemaker Manufacturer and Phone #:  [employer]: [phone] Pacemaker Dependent?:  No Date of Last Device Check:  09/07/2023         Normal Device Function?:  Yes     Electrophysiologist's Recommendations:   Have magnet available. Provide continuous ECG monitoring when magnet is used or reprogramming is to be performed.  Procedure may interfere with device function.  Magnet should be placed over device during procedure.  Per Device Clinic Standing Orders, Almarie ONEIDA Shutter  09/28/2023 11:17 AM

## 2023-10-07 HISTORY — PX: LIGAMENT REPAIR: SHX5444

## 2023-12-07 ENCOUNTER — Ambulatory Visit

## 2023-12-07 DIAGNOSIS — I428 Other cardiomyopathies: Secondary | ICD-10-CM | POA: Diagnosis not present

## 2023-12-08 LAB — CUP PACEART REMOTE DEVICE CHECK
Battery Remaining Longevity: 82 mo
Battery Remaining Percentage: 85 %
Battery Voltage: 3.01 V
Brady Statistic AP VP Percent: 8.5 %
Brady Statistic AP VS Percent: 1 %
Brady Statistic AS VP Percent: 91 %
Brady Statistic AS VS Percent: 1 %
Brady Statistic RA Percent Paced: 8.5 %
Date Time Interrogation Session: 20251001050106
Implantable Lead Connection Status: 753985
Implantable Lead Connection Status: 753985
Implantable Lead Connection Status: 753985
Implantable Lead Implant Date: 20091016
Implantable Lead Implant Date: 20091016
Implantable Lead Implant Date: 20091016
Implantable Lead Location: 753858
Implantable Lead Location: 753859
Implantable Lead Location: 753860
Implantable Lead Model: 4196
Implantable Lead Model: 7122
Implantable Pulse Generator Implant Date: 20241204
Lead Channel Impedance Value: 400 Ohm
Lead Channel Impedance Value: 510 Ohm
Lead Channel Impedance Value: 740 Ohm
Lead Channel Pacing Threshold Amplitude: 0.5 V
Lead Channel Pacing Threshold Amplitude: 1 V
Lead Channel Pacing Threshold Amplitude: 1.5 V
Lead Channel Pacing Threshold Pulse Width: 0.3 ms
Lead Channel Pacing Threshold Pulse Width: 0.3 ms
Lead Channel Pacing Threshold Pulse Width: 0.6 ms
Lead Channel Sensing Intrinsic Amplitude: 3.1 mV
Lead Channel Sensing Intrinsic Amplitude: 7.2 mV
Lead Channel Setting Pacing Amplitude: 2 V
Lead Channel Setting Pacing Amplitude: 2 V
Lead Channel Setting Pacing Amplitude: 2.5 V
Lead Channel Setting Pacing Pulse Width: 0.4 ms
Lead Channel Setting Pacing Pulse Width: 0.6 ms
Lead Channel Setting Sensing Sensitivity: 0.5 mV
Pulse Gen Model: 3222
Pulse Gen Serial Number: 8120146

## 2023-12-11 ENCOUNTER — Ambulatory Visit: Payer: Self-pay | Admitting: Internal Medicine

## 2023-12-12 NOTE — Progress Notes (Signed)
 Remote PPM Transmission

## 2023-12-13 ENCOUNTER — Encounter: Payer: Self-pay | Admitting: Allergy and Immunology

## 2023-12-13 ENCOUNTER — Ambulatory Visit (INDEPENDENT_AMBULATORY_CARE_PROVIDER_SITE_OTHER): Payer: Self-pay | Admitting: Allergy and Immunology

## 2023-12-13 VITALS — BP 142/70 | HR 93 | Temp 97.9°F | Resp 16 | Ht 64.76 in | Wt 191.3 lb

## 2023-12-13 DIAGNOSIS — J3089 Other allergic rhinitis: Secondary | ICD-10-CM

## 2023-12-13 DIAGNOSIS — J301 Allergic rhinitis due to pollen: Secondary | ICD-10-CM | POA: Diagnosis not present

## 2023-12-13 NOTE — Progress Notes (Unsigned)
 [place] [facility] [place] - [place]   Dear Bascom Necessary,  Thank you for referring [name] to the [facility] [place]  on 12/13/2023.   Below is a summation of this patient's evaluation and recommendations.  Thank you for your referral. I will keep you informed about this patient's response to treatment.   If you have any questions please do not hesitate to contact me.   Sincerely,  Camellia DOROTHA Denis, MD Allergy / Immunology [facility] [place]    ______________________________________________________________________    NEW PATIENT NOTE  Referring Provider: Necessary Bascom, NP Primary Provider: Necessary Bascom, NP Date of office visit: 12/13/2023    Subjective:   Chief Complaint:  [name] (DOB: 03/19/1955) is a 68 y.o. female who presents to the clinic on 12/13/2023 with a chief complaint of Allergic Rhinitis  (Says she got new furniture and began sneezing, coughing, and itching. ) .     HPI: [name] presents to this clinic in evaluation of rhinitis.  Sometime at the tail end of 2020 she started to develop explosive sneezing and watery eyes.  This appeared to correlate temporally with introduction of new furniture into the household.  She covered over the new furniture and she got a HEPA filter for the house and this seemed to help but since retirement 18 months ago she has had more exposure to that furniture at home and she has had more problems with sneezing and watery eyes.  There does not appear to be any other trigger that gives rise to this issue.  She does not have any associated anosmia or ugly nasal discharge or other respiratory tract symptoms or other atopic symptoms.  She did have a history of seasonal allergic rhinitis for which she would occasionally take some Claritin.  Past Medical History:  Diagnosis Date   Automatic implantable cardiac defibrillator in  situ    [employer] JUDE MODEL 7122   CHF (congestive heart failure) (HCC)    Diabetes mellitus    Hyperlipidemia    Hypertension 06/25/2011   Renal dopplers - superior mesenteric artery >50% diameter reduction; R renal artery - normal patency; L proximal renal artery 1-59% reduction (lower end of scale); both kidneys normal in size/symmetry with normal cortex and medulla   Leg pain 12/09/2010   doppler of R femoral artery - no evidence of dissection, AV fistula, pseudoaneurysm or other vascular abnormalities   Migraines    Obesity    PONV (postoperative nausea and vomiting)    Sleep apnea    on CPAP - AHI during sleep study was 44   Type II or unspecified type diabetes mellitus without mention of complication, not stated as uncontrolled    Type II or unspecified type diabetes mellitus without mention of complication, not stated as uncontrolled    Unspecified sleep apnea     Past Surgical History:  Procedure Laterality Date   ABDOMINAL HYSTERECTOMY     total -    BIV PACEMAKER GENERATOR CHANGE OUT N/A 01/16/2014   Procedure: BIV PACEMAKER GENERATOR CHANGE OUT;  Surgeon: Danelle LELON Birmingham, MD;  Location: [facility] CATH LAB;  Service: Cardiovascular;  Laterality: N/A;   BIV PACEMAKER GENERATOR CHANGEOUT N/A 02/09/2023   Procedure: BIV PACEMAKER GENERATOR CHANGEOUT;  Surgeon: Birmingham Danelle LELON, MD;  Location: [facility] INVASIVE CV LAB;  Service: Cardiovascular;  Laterality: N/A;   BREAST BIOPSY Left 2019   x2   CARDIAC CATHETERIZATION  07/15/2006   no significant CAD by cardiac cath, confirmed by intravascular US  of LAD; non-ischemic cardiomyopathy prob related to uncontrolled hypertension, DM and morbid obesity; moderate pulmonary hypertension; preserved cardiac output and cardiac index   CARDIAC DEFIBRILLATOR PLACEMENT     ICD by Dr. Waddell   CARDIOVASCULAR STRESS TEST  08/28/2010   R/P MV - pattern of normal perfusion in all regions, no scintigraphic evidence of inducible myocardial ischemia; no EKG changes;  normal perfusion study; pt did experience chesst pain during strudy, resolved spontaneously   DOPPLER ECHOCARDIOGRAPHY  06/25/2011   EF >55%; moderate concentric LV hypertrophy; LA mildly dilated, mild mitral annular calcification;    EXTRACORPOREAL SHOCK WAVE LITHOTRIPSY Left 06/09/2020   Procedure: EXTRACORPOREAL SHOCK WAVE LITHOTRIPSY (ESWL);  Surgeon: Selma Donnice SAUNDERS, MD;  Location: [facility];  Service: Urology;  Laterality: Left;  90 MINS   KNEE SURGERY Right    arthroscopic   LIGAMENT REPAIR Right 10/2023   Right Hand   NECK SURGERY     PACEMAKER INSERTION      Allergies as of 12/13/2023       Reactions   Flagyl [metronidazole] Anaphylaxis   Niacin Other (See Comments)   Niacin And Related Other (See Comments), Cough   flushing   Penicillins Rash   Has patient had a PCN reaction causing immediate rash, facial/tongue/throat swelling, SOB or lightheadedness with hypotension: No Has patient had a PCN reaction causing severe rash involving mucus membranes or skin necrosis: No Has patient had a PCN reaction that required hospitalization No Has patient had a PCN reaction occurring within the last 10 years: Yes 3-4 years ago If all of the above answers are NO, then may proceed with Cephalosporin use.   Yeast-derived Drug Products Swelling, Rash        Medication List    aspirin -acetaminophen -caffeine 250-250-65 MG tablet Commonly known as: EXCEDRIN MIGRAINE Take 2 tablets by mouth daily as needed for migraine.   carvedilol  12.5 MG tablet Commonly known as: COREG  TAKE 1 TABLET BY MOUTH TWICE A DAY   diazepam  5 MG tablet Commonly known as: VALIUM  Take 1/2 to 1 tablet at Bedtime ONLY if needed   Entresto  49-51 MG Generic drug: sacubitril -valsartan  Take 1 tablet by mouth 2 (two) times daily.   fenofibrate  micronized 134 MG capsule Commonly known as: LOFIBRA TAKE 1 CAPSULE BY MOUTH DAILY BEFORE BREAKFAST.   FreeStyle Libre 3 Sensor Misc Place 1 sensor  on the skin every 14 days. Use to check glucose continuously   imipramine  25 MG tablet Commonly known as: TOFRANIL  TAKE 1 TO 2 TABLETS BY MOUTH AT BEDTIME AS NEEDED FOR SLEEP   loratadine 10 MG tablet Commonly known as: CLARITIN Take 10 mg by mouth daily as needed for allergies.   Magnesium 400 MG Tabs Take 400 mg by mouth daily.   metFORMIN  500 MG 24 hr tablet Commonly known as: GLUCOPHAGE -XR TAKE 4 TABLETS BY MOUTH DAILY AS TOLERATED. SPREAD OVER MEALS   Mounjaro  5 MG/0.5ML Pen Generic drug: tirzepatide  INJECT 5 MG SUBCUTANEOUSLY WEEKLY   rosuvastatin  40 MG tablet Commonly known as: CRESTOR  TAKE 1 TABLET BY MOUTH DAILY   spironolactone  25 MG tablet Commonly known as: ALDACTONE  TAKE 1 TABLET BY MOUTH TWICE DAILY What changed: when to take this   valACYclovir  500 MG tablet Commonly known as: VALTREX  TAKE 1 TABLET(500 MG) BY MOUTH DAILY   verapamil  240 MG CR tablet Commonly known as: CALAN -SR TAKE 1 TABLET BY MOUTH AT BEDTIME   VITAMIN D   PO Take 2,000 Units by mouth daily.    Review of systems negative except as noted in HPI / PMHx or noted below:  Review of Systems  Constitutional: Negative.   HENT: Negative.    Eyes: Negative.   Respiratory: Negative.    Cardiovascular: Negative.   Gastrointestinal: Negative.   Genitourinary: Negative.   Musculoskeletal: Negative.   Skin: Negative.   Neurological: Negative.   Endo/Heme/Allergies: Negative.   Psychiatric/Behavioral: Negative.      Family History  Problem Relation Age of Onset   Cancer - Cervical Mother        precancerous cells   Colon polyps Mother    Valvular heart disease Father        late 60s, died after surgery   Hearing loss Father    Hypertension Father    Colon cancer Maternal Grandfather 77   Colon cancer Maternal Uncle     Social History   Socioeconomic History   Marital status: Married    Spouse name: Not on file   Number of children: Not on file   Years of education: Not on  file   Highest education level: Not on file  Occupational History   Not on file  Tobacco Use   Smoking status: Former    Current packs/day: 0.00    Types: Cigarettes    Quit date: 03/09/1975    Years since quitting: 48.7   Smokeless tobacco: Never   Tobacco comments:    light smoker for a few months as a teen   Vaping Use   Vaping status: Never Used  Substance and Sexual Activity   Alcohol use: Yes    Comment: Social   Drug use: Never   Sexual activity: Yes    Partners: Male    Birth control/protection: Post-menopausal  Other Topics Concern   Not on file  Social History Narrative   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  She works in an office setting.  Objective:   Vitals:   12/13/23 1414  BP: (!) 142/70  Pulse: 93  Resp: 16  Temp: 97.9 F (36.6 C)  SpO2: 98%   Height: 5' 4.76 (164.5 cm) Weight: 191 lb 4.8 oz (86.8 kg)  Physical Exam Constitutional:      Appearance: She is not diaphoretic.  HENT:     Head: Normocephalic.     Right Ear: Tympanic membrane, ear canal and external ear normal.     Left Ear: Tympanic membrane, ear canal and external ear normal.     Nose: Nose normal. No mucosal edema or rhinorrhea.     Mouth/Throat:     Pharynx: Uvula midline. No oropharyngeal exudate.  Eyes:     Conjunctiva/sclera: Conjunctivae normal.  Neck:     Thyroid : No thyromegaly.     Trachea: Trachea normal. No tracheal tenderness or tracheal deviation.  Cardiovascular:      Rate and Rhythm: Normal rate and regular rhythm.     Heart sounds: Normal heart sounds,  S1 normal and S2 normal. No murmur heard. Pulmonary:     Effort: No respiratory distress.     Breath sounds: Normal breath sounds. No stridor. No wheezing or rales.  Lymphadenopathy:     Head:     Right side of head: No tonsillar adenopathy.     Left side of head: No tonsillar adenopathy.     Cervical: No cervical adenopathy.  Skin:    Findings: No erythema or rash.     Nails: There is no clubbing.  Neurological:     Mental Status: She is alert.     Diagnostics: Allergy skin tests were not performed.   Assessment and Plan:    1. Perennial allergic rhinitis   2. Seasonal allergic rhinitis due to pollen    1. Treat and prevent inflammation of upper airway:   A. OTC Flonase  Sensimist - 2 sprays each nostril 3-7 times per week  2. If needed:   A. Fexofenadine  180 - 1 tablet 1 time per day  B. Pataday - 1 drop each eye 1 time per day  3. Return to clinic in 4 weeks   4. Further evaluation???  5. Influenza = Tamiflu. Covid = Paxlovid  Before we have [name] undergo any further evaluation for atopic disease we are going to have her use nasal steroids throughout the week for the next several weeks while she has the option of using oral antihistamines and antihistamines for her eye should they be required as noted above.  I will see her back in this clinic in 4 weeks and we will make a determination about further evaluation and treatment that may be required to address her rhinoconjunctivitis.  Camellia DOROTHA Denis, MD Allergy / Immunology [facility] [place] 

## 2023-12-13 NOTE — Patient Instructions (Addendum)
  1. Treat and prevent inflammation of upper airway:   A. OTC Flonase  Sensimist - 2 sprays each nostril 3-7 times per week  2. If needed:   A. Fexofenadine  180 - 1 tablet 1 time per day  B. Pataday - 1 drop each eye 1 time per day  3. Return to clinic in 4 weeks   4. Further evaluation???  5. Influenza = Tamiflu. Covid = Paxlovid

## 2023-12-14 ENCOUNTER — Encounter: Payer: Self-pay | Admitting: Allergy and Immunology

## 2023-12-14 MED ORDER — FEXOFENADINE HCL 180 MG PO TABS: 180.0000 mg | ORAL_TABLET | Freq: Every day | ORAL | 1 refills | Status: AC | PRN

## 2023-12-14 MED ORDER — FLONASE SENSIMIST 27.5 MCG/SPRAY NA SUSP: 2.0000 | Freq: Every day | NASAL | 12 refills | Status: AC

## 2023-12-15 NOTE — Progress Notes (Signed)
 Remote PPM Transmission

## 2023-12-27 ENCOUNTER — Ambulatory Visit: Payer: BC Managed Care – PPO

## 2023-12-30 ENCOUNTER — Other Ambulatory Visit: Payer: Self-pay | Admitting: Nurse Practitioner

## 2024-01-29 ENCOUNTER — Other Ambulatory Visit: Payer: Self-pay | Admitting: Cardiology

## 2024-01-29 ENCOUNTER — Encounter: Payer: Self-pay | Admitting: Cardiology

## 2024-01-29 DIAGNOSIS — I10 Essential (primary) hypertension: Secondary | ICD-10-CM

## 2024-01-31 ENCOUNTER — Ambulatory Visit: Admitting: Allergy and Immunology

## 2024-01-31 ENCOUNTER — Telehealth: Payer: Self-pay | Admitting: Cardiology

## 2024-01-31 NOTE — Telephone Encounter (Signed)
   Pt c/o of Chest Pain: STAT if active CP, including tightness, pressure, jaw pain, radiating pain to shoulder/upper arm/back, CP unrelieved by Nitro. Symptoms reported of SOB, nausea, vomiting, sweating.  1. Are you having CP right now? No   2. Are you experiencing any other symptoms (ex. SOB, nausea, vomiting, sweating)? States that she can feel her heart beating hard and when she is laying down it is worse.    3. Is your CP continuous or coming and going? Coming and going - more predominant during the day. States it feels like a weight is on her chest.   4. Have you taken Nitroglycerin? no   5. How long have you been experiencing CP? About 3-4 weeks.     6. If NO CP at time of call then end call with telling Pt to call back or call 911 if Chest pain returns prior to return call from triage team.   Wanted to schedule an appointment to come in and see Dr. Ladona

## 2024-01-31 NOTE — Telephone Encounter (Signed)
 Spoke with pt regarding chest tightness. Pt stated she is not feeling pain in her chest but feels a tightness. Pt stated this tightness comes and goes and is most noticeable at night when she lies down. Pt stated she can feel her heart beating hard when she is lying down. Pt stated the chest tightness occurs with stress. The chest tightness has been occurring for the last 3 weeks or so. Pt denied any shortness of breath, nausea, vomiting, sweating or chest pain at the time of this call. Pt was given ED precautions and was made an appointment for 12/31 with Thom Sluder, PA. Pt verbalized understanding. All questions if any were answered.

## 2024-03-07 ENCOUNTER — Ambulatory Visit: Attending: Cardiology

## 2024-03-07 ENCOUNTER — Ambulatory Visit: Admitting: Cardiology

## 2024-03-07 ENCOUNTER — Encounter

## 2024-03-07 DIAGNOSIS — I428 Other cardiomyopathies: Secondary | ICD-10-CM

## 2024-03-08 LAB — CUP PACEART REMOTE DEVICE CHECK
Battery Remaining Longevity: 79 mo
Battery Remaining Percentage: 82 %
Battery Voltage: 3.01 V
Brady Statistic AP VP Percent: 6.2 %
Brady Statistic AP VS Percent: 1 %
Brady Statistic AS VP Percent: 94 %
Brady Statistic AS VS Percent: 1 %
Brady Statistic RA Percent Paced: 6.1 %
Date Time Interrogation Session: 20251230164058
Implantable Lead Connection Status: 753985
Implantable Lead Connection Status: 753985
Implantable Lead Connection Status: 753985
Implantable Lead Implant Date: 20091016
Implantable Lead Implant Date: 20091016
Implantable Lead Implant Date: 20091016
Implantable Lead Location: 753858
Implantable Lead Location: 753859
Implantable Lead Location: 753860
Implantable Lead Model: 4196
Implantable Lead Model: 7122
Implantable Pulse Generator Implant Date: 20241204
Lead Channel Impedance Value: 400 Ohm
Lead Channel Impedance Value: 510 Ohm
Lead Channel Impedance Value: 730 Ohm
Lead Channel Pacing Threshold Amplitude: 0.5 V
Lead Channel Pacing Threshold Amplitude: 1 V
Lead Channel Pacing Threshold Amplitude: 1.5 V
Lead Channel Pacing Threshold Pulse Width: 0.3 ms
Lead Channel Pacing Threshold Pulse Width: 0.3 ms
Lead Channel Pacing Threshold Pulse Width: 0.6 ms
Lead Channel Sensing Intrinsic Amplitude: 10.6 mV
Lead Channel Sensing Intrinsic Amplitude: 3 mV
Lead Channel Setting Pacing Amplitude: 2 V
Lead Channel Setting Pacing Amplitude: 2 V
Lead Channel Setting Pacing Amplitude: 2.5 V
Lead Channel Setting Pacing Pulse Width: 0.4 ms
Lead Channel Setting Pacing Pulse Width: 0.6 ms
Lead Channel Setting Sensing Sensitivity: 0.5 mV
Pulse Gen Model: 3222
Pulse Gen Serial Number: 8120146

## 2024-03-10 ENCOUNTER — Ambulatory Visit: Payer: Self-pay | Admitting: Cardiology

## 2024-03-12 NOTE — Progress Notes (Signed)
 Remote PPM Transmission

## 2024-03-20 ENCOUNTER — Ambulatory Visit: Admitting: Allergy and Immunology

## 2024-06-06 ENCOUNTER — Ambulatory Visit

## 2024-07-23 ENCOUNTER — Ambulatory Visit: Admitting: Cardiology

## 2024-09-05 ENCOUNTER — Ambulatory Visit

## 2024-12-05 ENCOUNTER — Ambulatory Visit

## 2025-03-06 ENCOUNTER — Ambulatory Visit

## 2025-06-05 ENCOUNTER — Ambulatory Visit
